# Patient Record
Sex: Female | Born: 1940
Health system: Southern US, Community
[De-identification: ages and names within clinical notes are randomized; demographics above are authoritative.]

## PROBLEM LIST (undated history)

## (undated) DIAGNOSIS — T7840XA Allergy, unspecified, initial encounter: Secondary | ICD-10-CM

## (undated) DIAGNOSIS — M722 Plantar fascial fibromatosis: Secondary | ICD-10-CM

## (undated) DIAGNOSIS — G709 Myoneural disorder, unspecified: Secondary | ICD-10-CM

## (undated) DIAGNOSIS — N393 Stress incontinence (female) (male): Secondary | ICD-10-CM

## (undated) DIAGNOSIS — M1990 Unspecified osteoarthritis, unspecified site: Secondary | ICD-10-CM

## (undated) DIAGNOSIS — I10 Essential (primary) hypertension: Secondary | ICD-10-CM

## (undated) DIAGNOSIS — G4730 Sleep apnea, unspecified: Secondary | ICD-10-CM

## (undated) DIAGNOSIS — E669 Obesity, unspecified: Secondary | ICD-10-CM

## (undated) DIAGNOSIS — K219 Gastro-esophageal reflux disease without esophagitis: Secondary | ICD-10-CM

## (undated) DIAGNOSIS — G2581 Restless legs syndrome: Secondary | ICD-10-CM

## (undated) DIAGNOSIS — R001 Bradycardia, unspecified: Secondary | ICD-10-CM

## (undated) DIAGNOSIS — I499 Cardiac arrhythmia, unspecified: Secondary | ICD-10-CM

## (undated) DIAGNOSIS — Z87440 Personal history of urinary (tract) infections: Secondary | ICD-10-CM

## (undated) DIAGNOSIS — I4891 Unspecified atrial fibrillation: Secondary | ICD-10-CM

## (undated) DIAGNOSIS — I5189 Other ill-defined heart diseases: Secondary | ICD-10-CM

## (undated) DIAGNOSIS — C801 Malignant (primary) neoplasm, unspecified: Secondary | ICD-10-CM

## (undated) DIAGNOSIS — H269 Unspecified cataract: Secondary | ICD-10-CM

## (undated) DIAGNOSIS — E785 Hyperlipidemia, unspecified: Secondary | ICD-10-CM

## (undated) DIAGNOSIS — K635 Polyp of colon: Secondary | ICD-10-CM

## (undated) DIAGNOSIS — I340 Nonrheumatic mitral (valve) insufficiency: Secondary | ICD-10-CM

## (undated) DIAGNOSIS — F419 Anxiety disorder, unspecified: Secondary | ICD-10-CM

## (undated) DIAGNOSIS — J45909 Unspecified asthma, uncomplicated: Secondary | ICD-10-CM

## (undated) HISTORY — DX: Unspecified cataract: H26.9

## (undated) HISTORY — DX: Polyp of colon: K63.5

## (undated) HISTORY — DX: Nonrheumatic mitral (valve) insufficiency: I34.0

## (undated) HISTORY — PX: APPENDECTOMY: SHX54

## (undated) HISTORY — PX: VARICOSE VEIN SURGERY: SHX832

## (undated) HISTORY — PX: BLADDER SUSPENSION: SHX72

## (undated) HISTORY — DX: Essential (primary) hypertension: I10

## (undated) HISTORY — DX: Sleep apnea, unspecified: G47.30

## (undated) HISTORY — DX: Plantar fascial fibromatosis: M72.2

## (undated) HISTORY — DX: Hyperlipidemia, unspecified: E78.5

## (undated) HISTORY — PX: TONSILLECTOMY AND ADENOIDECTOMY: SUR1326

## (undated) HISTORY — DX: Restless legs syndrome: G25.81

## (undated) HISTORY — DX: Bradycardia, unspecified: R00.1

## (undated) HISTORY — DX: Other ill-defined heart diseases: I51.89

## (undated) HISTORY — DX: Obesity, unspecified: E66.9

## (undated) HISTORY — DX: Unspecified atrial fibrillation: I48.91

## (undated) HISTORY — PX: COLONOSCOPY W/ POLYPECTOMY: SHX1380

## (undated) HISTORY — DX: Allergy, unspecified, initial encounter: T78.40XA

## (undated) HISTORY — DX: Gastro-esophageal reflux disease without esophagitis: K21.9

## (undated) HISTORY — PX: OTHER SURGICAL HISTORY: SHX169

---

## 1990-12-01 HISTORY — PX: ABDOMINAL HYSTERECTOMY: SHX81

## 2000-09-10 ENCOUNTER — Encounter: Payer: Self-pay | Admitting: Vascular Surgery

## 2000-09-14 ENCOUNTER — Observation Stay (HOSPITAL_COMMUNITY): Admission: RE | Admit: 2000-09-14 | Discharge: 2000-09-15 | Payer: Self-pay | Admitting: Vascular Surgery

## 2000-09-14 ENCOUNTER — Encounter (INDEPENDENT_AMBULATORY_CARE_PROVIDER_SITE_OTHER): Payer: Self-pay

## 2001-02-01 ENCOUNTER — Other Ambulatory Visit: Admission: RE | Admit: 2001-02-01 | Discharge: 2001-02-01 | Payer: Self-pay | Admitting: Obstetrics and Gynecology

## 2001-11-08 ENCOUNTER — Encounter: Payer: Self-pay | Admitting: Urology

## 2001-11-10 ENCOUNTER — Ambulatory Visit (HOSPITAL_COMMUNITY): Admission: RE | Admit: 2001-11-10 | Discharge: 2001-11-10 | Payer: Self-pay | Admitting: Urology

## 2003-03-27 ENCOUNTER — Other Ambulatory Visit: Admission: RE | Admit: 2003-03-27 | Discharge: 2003-03-27 | Payer: Self-pay | Admitting: Obstetrics and Gynecology

## 2004-01-24 ENCOUNTER — Ambulatory Visit (HOSPITAL_BASED_OUTPATIENT_CLINIC_OR_DEPARTMENT_OTHER): Admission: RE | Admit: 2004-01-24 | Discharge: 2004-01-24 | Payer: Self-pay | Admitting: Family Medicine

## 2004-07-30 ENCOUNTER — Encounter: Admission: RE | Admit: 2004-07-30 | Discharge: 2004-07-30 | Payer: Self-pay | Admitting: Family Medicine

## 2005-02-17 ENCOUNTER — Other Ambulatory Visit: Admission: RE | Admit: 2005-02-17 | Discharge: 2005-02-17 | Payer: Self-pay | Admitting: Family Medicine

## 2005-08-11 ENCOUNTER — Encounter: Admission: RE | Admit: 2005-08-11 | Discharge: 2005-08-11 | Payer: Self-pay | Admitting: Family Medicine

## 2006-01-26 ENCOUNTER — Ambulatory Visit: Payer: Self-pay | Admitting: Gastroenterology

## 2006-02-05 ENCOUNTER — Ambulatory Visit: Payer: Self-pay | Admitting: Gastroenterology

## 2006-09-01 ENCOUNTER — Encounter: Admission: RE | Admit: 2006-09-01 | Discharge: 2006-09-01 | Payer: Self-pay | Admitting: Family Medicine

## 2007-09-03 ENCOUNTER — Encounter: Admission: RE | Admit: 2007-09-03 | Discharge: 2007-09-03 | Payer: Self-pay | Admitting: Family Medicine

## 2008-09-12 ENCOUNTER — Encounter: Admission: RE | Admit: 2008-09-12 | Discharge: 2008-09-12 | Payer: Self-pay | Admitting: Family Medicine

## 2008-09-21 ENCOUNTER — Encounter: Admission: RE | Admit: 2008-09-21 | Discharge: 2008-09-21 | Payer: Self-pay | Admitting: Family Medicine

## 2009-09-28 ENCOUNTER — Encounter: Admission: RE | Admit: 2009-09-28 | Discharge: 2009-09-28 | Payer: Self-pay | Admitting: Family Medicine

## 2010-09-30 ENCOUNTER — Encounter: Admission: RE | Admit: 2010-09-30 | Discharge: 2010-09-30 | Payer: Self-pay | Admitting: Family Medicine

## 2011-04-16 ENCOUNTER — Other Ambulatory Visit: Payer: Self-pay | Admitting: Internal Medicine

## 2011-04-18 NOTE — Op Note (Signed)
[facility]  Patient:    Julie Gill, Julie Gill [facility]                     MRN: [id number] Proc. Date: 09/14/00 Attending:  Quita Skye. Hart Rochester, M.D.                           Operative Report  PREOPERATIVE DIAGNOSIS:  Severe varicose veins of the right saphenous system.  POSTOPERATIVE DIAGNOSIS:  Severe varicose veins of the right saphenous system.  OPERATIONS: 1. Ligation and stripping of greater saphenous vein right leg. 2. Excision of multiple varicosities right leg.  SURGEON:  Quita Skye. Hart Rochester, M.D.  FIRST ASSISTANT:  Sherrie George, P.A.-C.  ANESTHESIA:  General endotracheal.  DESCRIPTION OF PROCEDURE:  The patient was taken to the operating room and placed in a supine position, at which time satisfactory general endotracheal anesthesia was administered.  The right leg was prepped with Betadine scrubbing solution and draped in a routine sterile manner.  The patients varicosities had been marked with the patient in the upright position prior to induction of anesthesia.  These involved the greater saphenous vein in the anterior and posterior calf extending up to the distal thigh.  A short incision was made anterior to the medial malleolus and the greater saphenous vein dissected free, being careful to avoid injury to the nerve.  The vein was ligated distally and opened, and the intraluminal stripper passed proximally and was palpated at the saphenofemoral junction where an oblique incision was made.  Saphenous vein was ligated at the saphenofemoral junction and transected and the small stripper head secured with a 2-0 silk tie.  Multiple transverse stab wound type incisions were made with a 15 blade over the marked varicosities in the calf and distal thigh, and these varicosities were removed by excision and avulsion techniques.  One incision was made in the posterior calf and a few in the pretibial region.  When this was completed, the leg was wrapped with  an ACE and the patient placed in the Trendelenburg position.  The saphenous vein stripped from proximal to distal.  Adequate compression applied.  Following this, the incisions were all closed in a subcuticular fashion with 3-0 Vicryl and Steri-Strips.  Sterile dressing was applied, and the patient was taken to the recovery room in satisfactory condition. DD:  09/14/00 TD:  09/14/00 Job: [id number]

## 2011-04-18 NOTE — Op Note (Signed)
[facility]  Patient:    MERINDA, VICTORINO [facility] Visit Number: [id number] MRN: [id number]          Service Type: DSU Location: DAY Attending Physician:  Katherine Roan Dictated by:   Rozanna Boer., M.D. Proc. Date: 11/10/01 Admit Date:  11/10/2001                             Operative Report  PREOPERATIVE DIAGNOSIS:  Stress urinary incontinence.  POSTOPERATIVE DIAGNOSIS:  Stress urinary incontinence.  OPERATION:  SPARC urethral sling.  ANESTHESIA:  General.  SURGEON:  Rozanna Boer., M.D.  BRIEF HISTORY:  This 70 year old white female has had worsening stress incontinence and enters now for urethral sling procedure.  She has been using for the last couple of months six pads a day for her incontinence.  In the last year, it has been 3-4 pads per day.  Anticholinergics have had little benefit.  She has a rather markedly positive stress test and enters now understanding the risks including but not limited to retention, inability to void, bleeding, pain, and infection.  DESCRIPTION OF PROCEDURE:  The patient was placed on the operating room table in the dorsal lithotomy position after satisfactory induction of general endotracheal anesthesia, was prepped and draped with Betadine in the usual sterile fashion.  She was given IV antibiotics.  The urethra was catheterized with a 16 Foley.  She was quite incontinent.  She was coughing during induction.  The bladder was drained and then over the mid urethral area, about 8 cc of a mixture of half-strength saline and 0.25% Marcaine was injected over the mid urethra and then laterally a short distance.  A midline incision was made over the mid urethra and with blunt tip scissors, a small pocket was developed on either side of the urethra.  This was so that I could feel the needle coming through the endopelvic fascia when it was passed.  There was some but minimal bleeding with  this maneuver.  Then attention was turned anteriorly where two small stab incisions were made 1 fingerbreadth off the midline on either side just 1 fingerbreadth above the symphysis pubis.  A spinal needle with 20 cc of a mixture of 0.25% Marcaine and saline mix was injected on either side to hydrodissect the bladder off the symphysis pubis because she had had previous hysterectomy.  Then the [facility] needles were then passed, hugging the symphysis pubis on either side as they popped into the space on either side of the urethra that had been previously created below. The catheter was then removed, and a cystoscope with a 70 degree lens was then inserted and the bladder filled.  The bladder was not punctured, but I could see the indentation of the needles as the bladder filled.  The scope was then removed, and the sling was then attached to either side of the [facility] needles and the midline marked with a hemostat as each side was pulled up into the suprapubic space.  With a clamp underneath the urethra to support it and to keep it from being too tight, I pulled the sheath off on either side, leaving the urethra supported, but the sling was fairly loosely placed underneath the urethra.  I could get a hemostat easily.  I could move it up and down nearly 1 cm underneath the urethra, demonstrating it was still loose.  Suprapubic pressure, she still leaked.  The wound  was then irrigated with saline and then closed with 3-0 Vicryl interrupted sutures which effected good hemostasis. The ends of the sling were then cut below the skin line suprapubically and the suprapubic incisions closed with Benzoin and Steri-Strips.  Sterile dry dressings were applied.  The Foley catheter was then reinserted, and vaginal packing was placed to tamponade this for about an hour.  She was taken to the recovery room in good condition.  Sponge, needle, and instrument counts were correct.  She will be then taken to outpatient  after the Foley and packing removed to be sure she can void before she goes home as an outpatient later today. Dictated by:   Rozanna Boer., M.D. Attending Physician:  Katherine Roan DD:  11/10/01 TD:  11/10/01 Job: [id number]

## 2011-04-25 ENCOUNTER — Ambulatory Visit (AMBULATORY_SURGERY_CENTER): Payer: Medicare Other | Admitting: *Deleted

## 2011-04-25 ENCOUNTER — Encounter: Payer: Self-pay | Admitting: Internal Medicine

## 2011-04-25 VITALS — Ht 63.0 in | Wt 186.0 lb

## 2011-04-25 DIAGNOSIS — Z86010 Personal history of colonic polyps: Secondary | ICD-10-CM

## 2011-04-25 MED ORDER — PEG-KCL-NACL-NASULF-NA ASC-C 100 G PO SOLR: ORAL | Status: DC

## 2011-05-06 ENCOUNTER — Ambulatory Visit (AMBULATORY_SURGERY_CENTER): Payer: Medicare Other | Admitting: Internal Medicine

## 2011-05-06 ENCOUNTER — Encounter: Payer: Self-pay | Admitting: Internal Medicine

## 2011-05-06 VITALS — BP 165/67 | HR 64 | Temp 98.5°F | Resp 20 | Ht 63.0 in | Wt 184.0 lb

## 2011-05-06 DIAGNOSIS — D126 Benign neoplasm of colon, unspecified: Secondary | ICD-10-CM

## 2011-05-06 DIAGNOSIS — Z1211 Encounter for screening for malignant neoplasm of colon: Secondary | ICD-10-CM

## 2011-05-06 DIAGNOSIS — Z800 Family history of malignant neoplasm of digestive organs: Secondary | ICD-10-CM

## 2011-05-06 DIAGNOSIS — Z86010 Personal history of colonic polyps: Secondary | ICD-10-CM

## 2011-05-06 MED ORDER — SODIUM CHLORIDE 0.9 % IV SOLN: 500.0000 mL | INTRAVENOUS | Status: DC

## 2011-05-06 NOTE — Patient Instructions (Signed)
Please review discharge instructions Please review information on diverticulosis and polyps

## 2011-05-07 ENCOUNTER — Telehealth: Payer: Self-pay

## 2011-05-07 NOTE — Telephone Encounter (Signed)

## 2011-05-13 NOTE — Progress Notes (Signed)
Quick Note:  Repeat colonoscopy 5 years Prior adenomas and a family hx colon cancer   ______

## 2011-09-15 ENCOUNTER — Other Ambulatory Visit: Payer: Self-pay | Admitting: Family Medicine

## 2011-09-15 DIAGNOSIS — Z1231 Encounter for screening mammogram for malignant neoplasm of breast: Secondary | ICD-10-CM

## 2011-10-03 ENCOUNTER — Ambulatory Visit
Admission: RE | Admit: 2011-10-03 | Discharge: 2011-10-03 | Disposition: A | Discharge status: Home / Self Care | Payer: Medicare Other | Source: Ambulatory Visit | Attending: Family Medicine | Admitting: Family Medicine

## 2011-10-03 DIAGNOSIS — Z1231 Encounter for screening mammogram for malignant neoplasm of breast: Secondary | ICD-10-CM

## 2011-11-07 ENCOUNTER — Telehealth: Payer: Self-pay | Admitting: Internal Medicine

## 2011-11-07 NOTE — Telephone Encounter (Signed)
Selita called fro [facility] and i scheduled pt appt 12-13/mt

## 2011-11-07 NOTE — Telephone Encounter (Addendum)
New problem Pt's daughter wanted to get mother in to see Dr Graciela Husbands for 2nd opinion. She has been seeing Dr Jayme Cloud.

## 2011-11-12 ENCOUNTER — Encounter: Payer: Self-pay | Admitting: *Deleted

## 2011-11-13 ENCOUNTER — Ambulatory Visit (INDEPENDENT_AMBULATORY_CARE_PROVIDER_SITE_OTHER): Payer: Medicare Other | Admitting: Internal Medicine

## 2011-11-13 ENCOUNTER — Encounter: Payer: Self-pay | Admitting: Internal Medicine

## 2011-11-13 ENCOUNTER — Telehealth: Payer: Self-pay | Admitting: Internal Medicine

## 2011-11-13 ENCOUNTER — Encounter: Payer: Self-pay | Admitting: *Deleted

## 2011-11-13 DIAGNOSIS — I480 Paroxysmal atrial fibrillation: Secondary | ICD-10-CM | POA: Insufficient documentation

## 2011-11-13 DIAGNOSIS — R0600 Dyspnea, unspecified: Secondary | ICD-10-CM

## 2011-11-13 DIAGNOSIS — I4891 Unspecified atrial fibrillation: Secondary | ICD-10-CM

## 2011-11-13 DIAGNOSIS — R001 Bradycardia, unspecified: Secondary | ICD-10-CM

## 2011-11-13 DIAGNOSIS — I498 Other specified cardiac arrhythmias: Secondary | ICD-10-CM

## 2011-11-13 MED ORDER — PINDOLOL 5 MG PO TABS: ORAL_TABLET | ORAL | Status: DC

## 2011-11-13 MED ORDER — WARFARIN SODIUM 5 MG PO TABS: 5.0000 mg | ORAL_TABLET | Freq: Every day | ORAL | Status: DC

## 2011-11-13 MED ORDER — RIVAROXABAN 20 MG PO TABS: 20.0000 mg | ORAL_TABLET | Freq: Every day | ORAL | Status: DC

## 2011-11-13 NOTE — Telephone Encounter (Signed)
New message:  Pt was seen today but states that she will not be able to afford the new medication and the card that was given to her to off set the cost/her insurance does not allow her to use.  Please call and let her know what to do.

## 2011-11-13 NOTE — Assessment & Plan Note (Signed)
The patient has paroxysmal atrial fibrillation with a rapid ventricular response and heart rates up to the 150-70 range. She also has sinus bradycardia. We have discussed the strategy of rate control versus rhythm control. Initially we will pursue the former and change her calcium blocker to an ISA beta blocker specifically, pindoolol 2.5 twice daily.  She is a CHADS VASC score of 3 and will discontinue her aspirin. We have discussed alternative oral anticoagulation and will begin her on Rivaroxaban.  Her echo demonstrated mild left atrial enlargement.

## 2011-11-13 NOTE — Assessment & Plan Note (Signed)
She has dyspnea on exertion. She is some degree of hypertensive heart disease and probably diastolic dysfunction. Will plan to undertake a stress Myoview scan  Has she has baseline ST segment changes.

## 2011-11-13 NOTE — Patient Instructions (Addendum)
Your physician recommends that you schedule a follow-up appointment in: PENDING TEST RESULTS Your physician has recommended you make the following change in your medication:  STOP ASPIRIN AND DILTIAZEM START XARELTO 20 MG  1 EVERY DAY  PINDOLOL 2.5 MG  1/2   TAB TWICE DAILY Your physician has requested that you have en exercise stress myoview. For further information please visit [email or web address] Please follow instruction sheet, as given. DX DYSPNEA

## 2011-11-13 NOTE — Progress Notes (Signed)
HPI  Julie Gill is a 70 y.o. female Is seen at her own request for second opinion related to arrhythmias. Her daughter works at [employer] u[employer] and I was recommended from there apparently.  She has a greater than 6 month history of recurrent palpitations. He did become increasingly frequent severe and of longer duration over the interval. They can last now minutes to hours. They frequently occur at night. She described some palpitations all day. However, she has a racing heart which occurs relatively less frequently. It is associated with some degree of shortness of breath but not great effort intolerance It is however very anxiety provoking. She also has palpitations that last all day long. He is concerned about being in the field her heart beat in her right ear which occurred both during the day as well as at night.  Thromboembolic risk factors are notable for age, gender, hypertension for a CHADS VASC score of 3.  She has developed dyspnea on exertion. Cold air is particularly problematic it is associated with chest discomfort. She is short of breath at less than 100 feet. She has extreme problems sleeping. She snores and has had neck episodes although she is not able to sleep well and of longer duration at night. She apparently had a negative sleep study years ago.  Echo cardiogram undertaken by Dr. Lucia Gaskins demonstrated normal left ventricular function with some degree of left ventricular hypertrophy left atrial enlargement.  She also was given a 30 day event recorder. She ate her for ablation on 2 days on one episode lasted about 25 minutes maximum heart rate was 178. She also had sinus bradycardia with rates in the 40s.  Past Medical History  Diagnosis Date  . GERD (gastroesophageal reflux disease)   . Hyperlipidemia   . Hypertension   . Atrial fibrillation     CHADS VASC score 3  . Sleep disturbance   . Sinus bradycardia     Past Surgical History  Procedure Date  . Abdominal  hysterectomy   . Tonsillectomy and adenoidectomy   . Bladder suspension   . Colonoscopy w/ polypectomy 1995, [id number], 2007, 05/06/2011    adenomas  . Appendectomy     Current Outpatient Prescriptions  Medication Sig Dispense Refill  . aspirin 325 MG tablet Take 325 mg by mouth daily.        . calcium carbonate (OS-CAL) 600 MG TABS Take 600 mg by mouth daily. Takes 1 and 1/2 tablet daily       . Cholecalciferol (VITAMIN D PO) Take 4,000 Units by mouth daily.        Marland Kitchen diltiazem (CARDIZEM CD) 240 MG 24 hr capsule Take 240 mg by mouth daily.        . fish oil-omega-3 fatty acids 1000 MG capsule Take 2 g by mouth 3 (three) times daily.        Marland Kitchen lisinopril (PRINIVIL,ZESTRIL) 20 MG tablet Take 20 mg by mouth daily.        Marland Kitchen lisinopril-hydrochlorothiazide (PRINZIDE,ZESTORETIC) 20-25 MG per tablet Take 1 tablet by mouth daily.        . Multiple Vitamins-Minerals (MULTIVITAMIN WITH MINERALS) tablet Take 1 tablet by mouth daily.        . pravastatin (PRAVACHOL) 40 MG tablet Take 80 mg by mouth daily.        . ranitidine (ZANTAC) 150 MG capsule Take 150 mg by mouth daily.        . vitamin E 800 UNIT capsule Take 800 Units by  mouth daily.         Current Facility-Administered Medications  Medication Dose Route Frequency Provider Last Rate Last Dose  . 0.9 %  sodium chloride infusion  500 mL Intravenous Continuous Iva Boop, MD      . 0.9 %  sodium chloride infusion  500 mL Intravenous Continuous Iva Boop, MD        Allergies  Allergen Reactions  . Codeine Other (See Comments)    Skin crawls  . Prednisone Other (See Comments)    tachycardia   Family History  Problem Relation Age of Onset  . Colon cancer Maternal Uncle   . Colon cancer Maternal Grandmother      Review of Systems negative except from HPI and PMH apart from Insomnia and peripheral neuropathy Physical Exam Well developed and well nourished Obese older Caucasian female appearing her stated age in no acute  distress HENT normal E scleral and icterus clear Neck Supple JVP flat; carotids brisk and full Clear to ausculation Regular rate and rhythm, no murmurs S4 present Soft with active bowel sounds No clubbing cyanosis none Edema Alert and oriented, grossly normal motor and sensory function Lymph nodes negative Trachea midline Affect engaging Skin Warm and Dry   ECG today demonstrated sinus rhythm at 54 Interval 0.15/0.09/0.46 Axis is 30 Nonspecific ST-T changes in the inferolateral leads  Event recorder demonstrated atrial fibrillation with rates up to 170, #2 PACs, #3 PVCs, #4 sinus bradycardia  Assessment and  Plan

## 2011-11-13 NOTE — Telephone Encounter (Signed)
SPOKE WITH PT CANNOT AFFORD XARELTO COSTS 257.05  A MONTH AT [facility]  IS ON FIXED INCOME AND THE REBATE CARD SHE IS DISQUALIFIED DUE TO HAVING AARP MEDICARE. PT AWARE WILL  DISCUSS WITH DR KLEIN/CY   PT MAY NOT GET  ASSISTANCE WITH MED DUE TO HAVING INS. PER  DR Graciela Husbands  MAY START WARFARIN 5 MG  AS DIRECTED PT AWARE  MED SENT TO PHARMACY VIA EPIC  AND SOMEONE WILL  CALL TOMORROW  TO SCHEDULE APPT  AS NEW PT WITH COUMADIN CLINIC

## 2011-11-13 NOTE — Assessment & Plan Note (Signed)
The patient's bradycardia has been noted at home. This may have some impact on her ability to use rate control without backup bradycardia pacing or potentially need for Antiarrhythmic therapy

## 2011-11-14 NOTE — Telephone Encounter (Signed)
Spoke with pt.  Made appt for Coumadin clinic on 12/19.

## 2011-11-19 ENCOUNTER — Ambulatory Visit (INDEPENDENT_AMBULATORY_CARE_PROVIDER_SITE_OTHER): Payer: Medicare Other | Admitting: *Deleted

## 2011-11-19 ENCOUNTER — Ambulatory Visit (HOSPITAL_COMMUNITY): Payer: Medicare Other | Attending: Internal Medicine | Admitting: Radiology

## 2011-11-19 ENCOUNTER — Encounter: Payer: Self-pay | Admitting: Internal Medicine

## 2011-11-19 ENCOUNTER — Telehealth: Payer: Self-pay | Admitting: *Deleted

## 2011-11-19 VITALS — BP 141/74 | Ht 63.0 in | Wt 186.0 lb

## 2011-11-19 DIAGNOSIS — R5381 Other malaise: Secondary | ICD-10-CM | POA: Insufficient documentation

## 2011-11-19 DIAGNOSIS — I10 Essential (primary) hypertension: Secondary | ICD-10-CM | POA: Insufficient documentation

## 2011-11-19 DIAGNOSIS — R002 Palpitations: Secondary | ICD-10-CM | POA: Insufficient documentation

## 2011-11-19 DIAGNOSIS — E785 Hyperlipidemia, unspecified: Secondary | ICD-10-CM | POA: Insufficient documentation

## 2011-11-19 DIAGNOSIS — R0989 Other specified symptoms and signs involving the circulatory and respiratory systems: Secondary | ICD-10-CM | POA: Insufficient documentation

## 2011-11-19 DIAGNOSIS — R079 Chest pain, unspecified: Secondary | ICD-10-CM

## 2011-11-19 DIAGNOSIS — M79661 Pain in right lower leg: Secondary | ICD-10-CM

## 2011-11-19 DIAGNOSIS — R000 Tachycardia, unspecified: Secondary | ICD-10-CM | POA: Insufficient documentation

## 2011-11-19 DIAGNOSIS — Z7901 Long term (current) use of anticoagulants: Secondary | ICD-10-CM | POA: Insufficient documentation

## 2011-11-19 DIAGNOSIS — R0602 Shortness of breath: Secondary | ICD-10-CM | POA: Insufficient documentation

## 2011-11-19 DIAGNOSIS — R110 Nausea: Secondary | ICD-10-CM | POA: Insufficient documentation

## 2011-11-19 DIAGNOSIS — R5383 Other fatigue: Secondary | ICD-10-CM | POA: Insufficient documentation

## 2011-11-19 DIAGNOSIS — R0789 Other chest pain: Secondary | ICD-10-CM | POA: Insufficient documentation

## 2011-11-19 DIAGNOSIS — M79662 Pain in left lower leg: Secondary | ICD-10-CM

## 2011-11-19 DIAGNOSIS — I4891 Unspecified atrial fibrillation: Secondary | ICD-10-CM

## 2011-11-19 DIAGNOSIS — R0600 Dyspnea, unspecified: Secondary | ICD-10-CM

## 2011-11-19 DIAGNOSIS — E7800 Pure hypercholesterolemia, unspecified: Secondary | ICD-10-CM

## 2011-11-19 DIAGNOSIS — R0609 Other forms of dyspnea: Secondary | ICD-10-CM | POA: Insufficient documentation

## 2011-11-19 LAB — POCT INR: INR: 1.1

## 2011-11-19 MED ORDER — TECHNETIUM TC 99M TETROFOSMIN IV KIT
10.7000 | PACK | Freq: Once | INTRAVENOUS | Status: AC | PRN
  Administered 2011-11-19: 11 via INTRAVENOUS

## 2011-11-19 MED ORDER — TECHNETIUM TC 99M TETROFOSMIN IV KIT
33.0000 | PACK | Freq: Once | INTRAVENOUS | Status: AC | PRN
  Administered 2011-11-19: 33 via INTRAVENOUS

## 2011-11-19 NOTE — Progress Notes (Unsigned)
   Patient ID: Julie Gill, female    DOB: 1941-02-08, 70 y.o.   MRN: [id number]  HPI    Review of Systems    Physical Exam

## 2011-11-19 NOTE — Progress Notes (Signed)
[facility] SITE 3 NUCLEAR MED [address] [place] [address] [phone]  Cardiology Nuclear Med Study  Julie Gill is a 70 y.o. female [id number] August 15, 1941   Nuclear Med Background Indication for Stress Test:  Evaluation for Ischemia and recent new PAF with RVR  History:  No previous documented CAD, Abnormal EKG, Asthma, Echo: NL EF, and 1980's per patient Myocardial Perfusion Study: NL per patient  Cardiac Risk Factors: Hypertension and Lipids  Symptoms:  Chest Pressure and Chest tightness with Exertion (last date of chest discomfort 2-3 days ago, 30 minute duration per patient), DOE, Fatigue, Fatigue with Exertion, Nausea, Palpitations, Rapid HR and SOB   Nuclear Pre-Procedure Caffeine/Decaff Intake:  None NPO After: 10:00pm   Lungs:  Clear IV 0.9% NS with Angio Cath:  22g  IV Site: R Forearm  IV Started by:  Stanton Kidney, EMT-P  Chest Size (in):  36 Cup Size: C  Height: 5\' 3"  (1.6 m)  Weight:  186 lb (84.369 kg)  BMI:  Body mass index is 32.95 kg/(m^2). Tech Comments:  Held Pindolol > 24 hours, per patient.    Nuclear Med Study 1 or 2 day study: 1 day  Stress Test Type:  Stress  Reading MD: Willa Rough, MD  Order Authorizing Provider:  Sherryl Manges, MD  Resting Radionuclide: Technetium 61m Tetrofosmin  Resting Radionuclide Dose: 10.7 mCi   Stress Radionuclide:  Technetium 22m Tetrofosmin  Stress Radionuclide Dose: 33.0 mCi           Stress Protocol Rest HR: 55 Stress HR: 129  Rest BP: 141/74 Stress BP: 214/62  Exercise Time (min): 6:15 METS: 7.3   Predicted Max HR: 152 bpm % Max HR: 84.87 bpm Rate Pressure Product: 21308   Dose of Adenosine (mg):  n/a Dose of Lexiscan: n/a mg  Dose of Atropine (mg): n/a Dose of Dobutamine: n/a mcg/kg/min (at max HR)  Stress Test Technologist: Irean Hong, RN  Nuclear Technologist:  Domenic Polite, CNMT     Rest Procedure:  Myocardial perfusion imaging was performed at rest 45 minutes following  the intravenous administration of Technetium 63m Tetrofosmin. Rest ECG: SB- NSR with sinus arrhythmia, PAC  Stress Procedure:  The patient exercised for 6 minutes and 15 seconds, RPE=15.  The patient stopped due to DOE, bilateral calf tightness 8/10, and complained of chest tightness 8/10 at peak exercise.  There were nonspecific ST-T wave changes.There was a hypertensive response to exercise. There were occasional PAC's.  Technetium 78m Tetrofosmin was injected at peak exercise and myocardial perfusion imaging was performed after a brief delay. Dr. Odessa Fleming notified of the patients chest and calf tightness with exercise, and the hypertensive response to exercise. Lower arterial  Scheduled for 12/12/11 per Dr. Viviann Spare Klein/Patsy Dominga [place] Stress ECG: No significant change from baseline ECG  QPS Raw Data Images:  Patient motion noted; appropriate software correction applied. Stress Images:  Normal homogeneous uptake in all areas of the myocardium. Rest Images:  Normal homogeneous uptake in all areas of the myocardium. Subtraction (SDS):  No evidence of ischemia. Transient Ischemic Dilatation (Normal <1.22):  1.04 Lung/Heart Ratio (Normal <0.45):  0.24  Quantitative Gated Spect Images QGS EDV:  95 ml QGS ESV:  29 ml QGS cine images:  Normal Wall Motion QGS EF: 70%  Impression Exercise Capacity:  Limitted. See below BP Response:  Hypertensive blood pressure response. Clinical Symptoms:  See below ECG Impression:  No significant ST segment change suggestive of ischemia. Comparison with Prior Nuclear Study:  No previous nuclear study performed  Overall Impression:  There was marked increase in systolic BP early in stress. The patient then had significant chest pain and calf tightness. There was no EKG change. The nuclear images are normal. There is no scar or ischemia.  Willa Rough, MD

## 2011-11-19 NOTE — Telephone Encounter (Signed)
The patient had a Rest/Stress Myoview today.  Dr. Odessa Fleming notified of the patients chest and calf tightness with exercise, and the hypertensive response to exercise. Dr Graciela Husbands reviewed the images and EKG's. Increase the pindolol to 5mg  twice a day per verbal orders by Dr. Sherryl Manges. Alera Quevedo,RN  Lower arterial scheduled on  12/12/11 per verbal orders by Dr. Viviann Spare Klein/Antwione Picotte,RN  Follow-up in one week with [name] (PCP) for BP and HR check.Anterrio Mccleery,RN.

## 2011-11-19 NOTE — Progress Notes (Unsigned)
The patient underwent Myoview scanning today. There were no perfusion defects. Electrocardiogram was nondiagnostic. It was noted however that her blood pressure was exceedingly high. Resting blood pressure was 141 stage I blood pressure was 206/69. We will have her follow up with her primary care physician for blood pressure management.  She is also noted to have calf tightness and so we'll undertake ABIs.

## 2011-11-19 NOTE — Telephone Encounter (Signed)
Per orders from Dr. Graciela Husbands, he was going to have the patient increase pindolol to 5mg  bid due to elevated BP during her myoview. The patient HR was only 55 bpm. Diltiazem was listed on her med list, orders were given by Dr. Graciela Husbands to cut diltiazem in 1/2 and continue with orders for the higher dose of pindolol. Irean Hong, RN for nuc med called the patient about this. She has advised me that the patient is not on diltiazem- this was stopped about a week ago. She is on zestoretic (lisinopril/hctz) 20/25 one tablet every morning & lisinopril 20mg  one tablet every evening. The patient had been off her pindolol for about 24 hours when she had her test. I will forward this to Dr. Graciela Husbands to see if he still wants to increase the pindolol to 5 mg twice daily.

## 2011-11-19 NOTE — Patient Instructions (Signed)

## 2011-11-19 NOTE — Telephone Encounter (Signed)
Forwarding to [facility] for documentation of MD orders.

## 2011-11-20 ENCOUNTER — Telehealth: Payer: Self-pay | Admitting: Internal Medicine

## 2011-11-20 NOTE — Telephone Encounter (Signed)
I called the patient this morning. She asked that I call her daughter to discuss everything with her because she is very anxious and nervous about everything. I have left a message for Tammy on her cell # at [phone] to call.

## 2011-11-20 NOTE — Telephone Encounter (Signed)
Fu call Pt's daughter wanted to talk to you about this

## 2011-11-20 NOTE — Telephone Encounter (Signed)
I spoke with the patient's daughter. We reviewed the patient's medications and Dr. Odessa Fleming recommendations for increasing her pindolol to 5 mg bid. We reviewed that the patient should follow up with her PCP to manage her BP. She has a follow up with them the first or second week of January. She will take and record her BP readings in the interim. The patient is not symptomatic with her HR's. I explained the reasoning for her lower arterial study as well. The patient will follow up with Dr. Graciela Husbands on 12/30/11.

## 2011-11-26 ENCOUNTER — Ambulatory Visit (INDEPENDENT_AMBULATORY_CARE_PROVIDER_SITE_OTHER): Payer: Medicare Other | Admitting: *Deleted

## 2011-11-26 DIAGNOSIS — Z7901 Long term (current) use of anticoagulants: Secondary | ICD-10-CM

## 2011-11-26 DIAGNOSIS — I4891 Unspecified atrial fibrillation: Secondary | ICD-10-CM

## 2011-11-26 LAB — POCT INR: INR: 1.9

## 2011-11-27 ENCOUNTER — Telehealth: Payer: Self-pay | Admitting: *Deleted

## 2011-11-27 ENCOUNTER — Encounter: Payer: Self-pay | Admitting: Cardiology

## 2011-11-27 NOTE — Telephone Encounter (Signed)
I spoke with the patient regarding her myoview results. She reported that she had an episode of a-fib on Saturday night that lasted about 3 hours. Her HR was 123 bpm, but when she converted, she was in the 70's. She is feeling ok. I explained I would forward to Dr. Graciela Husbands as an Lorain Childes, but she should continue to monitor her episodes and if she feels bad or notices she is not coming out of a-fib, she should call us back. She voices understanding.

## 2011-12-02 NOTE — Telephone Encounter (Signed)
With her sinus rate in the 70's lets try increasing the pindolol from 2.5>>5 bid and see if we can control the rapid afib thankssteve

## 2011-12-03 ENCOUNTER — Ambulatory Visit (INDEPENDENT_AMBULATORY_CARE_PROVIDER_SITE_OTHER): Payer: Medicare Other | Admitting: *Deleted

## 2011-12-03 DIAGNOSIS — Z7901 Long term (current) use of anticoagulants: Secondary | ICD-10-CM

## 2011-12-03 DIAGNOSIS — I4891 Unspecified atrial fibrillation: Secondary | ICD-10-CM

## 2011-12-03 LAB — POCT INR: INR: 2.2

## 2011-12-03 NOTE — Telephone Encounter (Signed)
I spoke with the patient regarding Dr. Odessa Fleming recommendations to increase pindolol to 5mg  BID. Per the patient, she was already doing this. In reviewing her chart, we did make this increase on 11/19/11 after her myoview. Irean Hong, RN had spoken with Dr. Graciela Husbands after the patient's myoview due to her elevated BP readings. The patient's HR was at 55 bpm at that time. Orders received from Dr. Graciela Husbands to go ahead and have the patient increase pindolol to 5mg  BID. This is documented in her chart. In speaking with the patient today, she did give me BP and HR readings on her current regimen of meds:   12/28- (am) 119/50 HR-48 (before meds)            (pm) 121/51 HR-43 1/1- (am) 122/47 HR- 62        (afternoon) 118/57 HR-50        (pm) 103/43 HR-47 (after meds)  I explained to the patient I would forward this to Dr. Graciela Husbands for him to review and call her back after he does. She is agreeable.

## 2011-12-04 NOTE — Telephone Encounter (Signed)
Spoke with patient not doing too badly  Will leave her on currrent regime until we see her in la[place] jan  The next step swould be AAD rx

## 2011-12-04 NOTE — Telephone Encounter (Signed)
Routing to Dr. Klein

## 2011-12-08 ENCOUNTER — Telehealth: Payer: Self-pay | Admitting: *Deleted

## 2011-12-08 ENCOUNTER — Telehealth: Payer: Self-pay | Admitting: Internal Medicine

## 2011-12-08 DIAGNOSIS — I4891 Unspecified atrial fibrillation: Secondary | ICD-10-CM

## 2011-12-08 MED ORDER — WARFARIN SODIUM 5 MG PO TABS: 5.0000 mg | ORAL_TABLET | ORAL | Status: DC

## 2011-12-08 MED ORDER — PINDOLOL 5 MG PO TABS: 5.0000 mg | ORAL_TABLET | Freq: Two times a day (BID) | ORAL | Status: DC

## 2011-12-08 NOTE — Telephone Encounter (Signed)
Pt had a a-fib episode 1:30am Sunday morning and it lasted an hour and Dr. Graciela Husbands wanted to call if anything happen and no other symptoms just fast HR

## 2011-12-08 NOTE — Telephone Encounter (Signed)
Message copied by Denario Bagot, Franchot Mimes on Mon Dec 08, 2011 11:04 AM ------      Message from: [facility], PATRICIA L      Created: Mon Dec 08, 2011  9:28 AM      Regarding: refill       Pt called with another question and also stated she needed refill of Coumadin called to [facility] [address]  This is a new pharmacy for her.      Thanks,      Dennie Bible

## 2011-12-08 NOTE — Telephone Encounter (Signed)
Spoke with pt. She reports she had afib from 1:30-2:30 Sunday morning. Blood pressure was 151/59 and heart rate 75 at that time.  No other complaints. Heart rate regular now.  I told pt she should continue meds as prescribed and that I would make Dr. Graciela Husbands aware of her call and that we would call her if he had any additional recommendations.  She has appt with Dr. Graciela Husbands on December 30, 2011 and is aware of this appt. Will send refill of pindolol to [facility] on [address] and [address] per her request.

## 2011-12-11 ENCOUNTER — Telehealth: Payer: Self-pay | Admitting: Internal Medicine

## 2011-12-11 NOTE — Telephone Encounter (Signed)
I spoke with the patient's daughter. She reports that the patient's episodes of a-fib are occuring more frequently. Sunday she had an episode that lasted about an hour. Last night she had an episode that lasted about 2& 1/2 hours. She reported to her daughter that her rates were up to 125 bpm. For the first time last night, she did feel like she would faint if she passed out. It sounds like she converted to normal rhythm, then felt pre-syncopal, but that is not completely clear to me. She does feel "washed out" for about 2-3 days after she has an a-fib episode. I explained to Julie Gill, that we may need to look at putting her on different therapies for her a-fib. Her does of pindolol is already at 5 mg twice daily. Her rate in sinus rhythm tends to run between 40-60 bpm. I advised I would speak with Dr. Graciela Husbands and touch base with them tomorrow. Julie Gill is agreeable. The patient already has an appointment with Dr. Graciela Husbands on 1/29.

## 2011-12-11 NOTE — Telephone Encounter (Signed)
Follow-up:    Patient's daughter called in because her mother is still having problems quite a bit of problems with her afib. She had an episode, whit lasted two and a half hours, in which she experienced light-headedness.  Would like to know preventative measures she could take when she feels an episode coming.  Please call back.

## 2011-12-12 ENCOUNTER — Encounter (INDEPENDENT_AMBULATORY_CARE_PROVIDER_SITE_OTHER): Payer: Medicare Other | Admitting: *Deleted

## 2011-12-12 ENCOUNTER — Ambulatory Visit (INDEPENDENT_AMBULATORY_CARE_PROVIDER_SITE_OTHER): Payer: Medicare Other | Admitting: Internal Medicine

## 2011-12-12 DIAGNOSIS — M79661 Pain in right lower leg: Secondary | ICD-10-CM

## 2011-12-12 DIAGNOSIS — I739 Peripheral vascular disease, unspecified: Secondary | ICD-10-CM

## 2011-12-12 DIAGNOSIS — I4891 Unspecified atrial fibrillation: Secondary | ICD-10-CM

## 2011-12-12 MED ORDER — FLECAINIDE ACETATE 100 MG PO TABS: 100.0000 mg | ORAL_TABLET | Freq: Two times a day (BID) | ORAL | Status: DC

## 2011-12-12 NOTE — Assessment & Plan Note (Signed)
With clinically symptomatic atrial fibrillation normal left ventricular function and a nonischemic Myoview with discussed the role of antiarrhythmic drug therapy and has settled on the use of flecainide as our first choice. We did discuss progress and potential including the risk of death. She understands these risks. She is willing to proceed.  We'll begin her on flecainide 100 mg twice daily in addition to her pindolol. She was admitted for treadmill testing to assess her progress at risk at higher heart rates.

## 2011-12-12 NOTE — Telephone Encounter (Signed)
The patient was here today for her ABI's. Dr. Graciela Husbands saw the patient and started her on flecainide.

## 2011-12-12 NOTE — Progress Notes (Signed)
  HPI  Julie Gill is a 71 y.o. female Is seen in followup for atrial fibrillation she continues to have recurrent palpitations with rvr.  With her b radycardia  unfortureely limits rate control with strategies.  Past Medical History  Diagnosis Date  . GERD (gastroesophageal reflux disease)   . Hyperlipidemia   . Hypertension   . Atrial fibrillation     CHADS VASC score 3  . Sleep disturbance   . Sinus bradycardia     Past Surgical History  Procedure Date  . Abdominal hysterectomy   . Tonsillectomy and adenoidectomy   . Bladder suspension   . Colonoscopy w/ polypectomy 1995, [id number], 2007, 05/06/2011    adenomas  . Appendectomy   . Varicose vein surgery     Current Outpatient Prescriptions  Medication Sig Dispense Refill  . calcium carbonate (OS-CAL) 600 MG TABS Take 600 mg by mouth daily. Takes 1 and 1/2 tablet daily       . Cholecalciferol (VITAMIN D PO) Take 4,000 Units by mouth daily.        . fish oil-omega-3 fatty acids 1000 MG capsule Take two capsules by mouth twice daily      . flecainide (TAMBOCOR) 100 MG tablet Take 1 tablet (100 mg total) by mouth 2 (two) times daily.  60 tablet  6  . lisinopril (PRINIVIL,ZESTRIL) 20 MG tablet Take 20 mg by mouth daily.        Marland Kitchen lisinopril-hydrochlorothiazide (PRINZIDE,ZESTORETIC) 20-25 MG per tablet Take 1 tablet by mouth daily.        . Multiple Vitamins-Minerals (MULTIVITAMIN WITH MINERALS) tablet Take 1 tablet by mouth daily.        . pindolol (VISKEN) 5 MG tablet Take 1 tablet (5 mg total) by mouth 2 (two) times daily.  60 tablet  11  . pravastatin (PRAVACHOL) 40 MG tablet Take 80 mg by mouth daily.        . ranitidine (ZANTAC) 150 MG capsule Take 150 mg by mouth daily.        . vitamin E 800 UNIT capsule Take 800 Units by mouth daily.        Marland Kitchen warfarin (COUMADIN) 5 MG tablet Take 1 tablet (5 mg total) by mouth as directed.  120 tablet  0   Current Facility-Administered Medications  Medication Dose Route Frequency  Provider Last Rate Last Dose  . 0.9 %  sodium chloride infusion  500 mL Intravenous Continuous Iva Boop, MD      . 0.9 %  sodium chloride infusion  500 mL Intravenous Continuous Iva Boop, MD        Allergies  Allergen Reactions  . Codeine Other (See Comments)    Skin crawls  . Prednisone Other (See Comments)    tachycardia    Review of Systems negative except from HPI and PMH  Physical Exam There were no vitals taken for this visit. Well developed and well nourished in no acute distress HENT normal E scleral and icterus clear Neck Supple JVP flat; carotids brisk and full Clear to ausculation Regular rate and rhythm, no murmurs gallops or rub Soft with active bowel sounds No clubbing cyanosis none Edema Alert and oriented, grossly normal motor and sensory function Skin Warm and Dry   Assessment and  Plan

## 2011-12-12 NOTE — Patient Instructions (Signed)
Your physician has recommended you make the following change in your medication:  1) Start Flecainide 100 mg one tablet by mouth twice daily.  Your physician has requested that you have an exercise tolerance test in 7-10 days with Dr. Gay Filler, PA. For further information please visit [email or web address] Please also follow instruction sheet, as given.

## 2011-12-12 NOTE — Telephone Encounter (Signed)
It sound lijke we willneed a strategy change  To consider rhtyhm control or pacer support for bradycardia Lets have her come in to discuss options

## 2011-12-14 ENCOUNTER — Telehealth: Payer: Self-pay | Admitting: Adult Health

## 2011-12-14 NOTE — Telephone Encounter (Signed)
Patient called stating that since she started taking Flecinide, prescribed by Dr. Graciela Husbands on 12/12/2011 she has been having chest pressure and has not begun to have back pain as well. This was not occuring before she took the flecinide. She is concerned that the medication is causing this. She states that she is lightheaded as well.  I have advised her not to take the Flecinide dose tonight. I will have Dr. Odessa Fleming nurse call her in am for follow-up.

## 2011-12-15 ENCOUNTER — Ambulatory Visit (INDEPENDENT_AMBULATORY_CARE_PROVIDER_SITE_OTHER): Payer: Medicare Other

## 2011-12-15 ENCOUNTER — Telehealth: Payer: Self-pay | Admitting: Internal Medicine

## 2011-12-15 VITALS — BP 152/80 | HR 70

## 2011-12-15 DIAGNOSIS — I4891 Unspecified atrial fibrillation: Secondary | ICD-10-CM

## 2011-12-15 NOTE — Telephone Encounter (Signed)
Pt calling re BP 134/52 pulse 39 this am @ 930am and was to be called back re change in meds from this weekend, pls advise

## 2011-12-15 NOTE — Progress Notes (Signed)
The pt came into the office for an EKG to follow-up on the symptoms she is having since taking Flecainide.  The pt's last dose of this medication was Sunday morning.  The pt did speak with Joni Reining NP on Sunday about chest heaviness and feeling lightheaded.  The pt had EKG which showed NSR pulse 70.  The pt said that her husband dropped her off at the front door and she had to sit down in the lobby to catch her breath.  When I brought the pt back from the lobby she was short winded and I could hear her wheezing. After the pt sat for a few minutes her breathing was normal.  I listened to her lungs and they were clear.  The pt also complains of her head feeling like it is going to explode when she bends over.  The pt has taken 2 doses of Claritin for her head (no decongestant).  I asked the pt if she has a history of asthma.  The pt does not have a diagnosis of asthma but this has been mentioned to her in the past by PCP.  I spoke with Dr Graciela Husbands and made him aware of symptoms and EKG.  He recommends that the pt remain off of Flecainide at this time and contact the PCP about head pressure and wheezing.  The pt is scheduled to see her PCP on Wednesday. I instructed the pt to call the PCP or our office if she developed further problems prior to appointment.  Pt agreed with plan. Per Dr Graciela Husbands the pt can retry Flecainide in the future if her symptoms are related to respiratory infection.

## 2011-12-15 NOTE — Telephone Encounter (Signed)
I spoke with the pt and she complains of heaviness in chest, legs and arms feel weak, heaviness in head and her whole body does not feel right since starting Flecainide.  The pt's last dose was Sunday morning.  The pt states she has always had some chest tightness but this is worse since starting flecainide. I spoke with Dr Graciela Husbands and he would like the pt to come into the office for an EKG. The pt will come into the office after her appointment with nutritionist.

## 2011-12-16 NOTE — Telephone Encounter (Signed)
I was not in the office on Monday, but the patient did walk in for a nurse visit and this was addressed by Dr. Graciela Husbands.

## 2011-12-17 ENCOUNTER — Ambulatory Visit (INDEPENDENT_AMBULATORY_CARE_PROVIDER_SITE_OTHER): Payer: Medicare Other | Admitting: *Deleted

## 2011-12-17 DIAGNOSIS — Z7901 Long term (current) use of anticoagulants: Secondary | ICD-10-CM

## 2011-12-17 DIAGNOSIS — I4891 Unspecified atrial fibrillation: Secondary | ICD-10-CM

## 2011-12-17 LAB — POCT INR: INR: 2

## 2011-12-18 ENCOUNTER — Ambulatory Visit: Payer: Self-pay | Admitting: Cardiology

## 2011-12-18 ENCOUNTER — Other Ambulatory Visit: Payer: Self-pay | Admitting: Family Medicine

## 2011-12-18 DIAGNOSIS — I4891 Unspecified atrial fibrillation: Secondary | ICD-10-CM

## 2011-12-18 DIAGNOSIS — Z7901 Long term (current) use of anticoagulants: Secondary | ICD-10-CM

## 2011-12-18 DIAGNOSIS — R51 Headache: Secondary | ICD-10-CM

## 2011-12-19 ENCOUNTER — Ambulatory Visit
Admission: RE | Admit: 2011-12-19 | Discharge: 2011-12-19 | Disposition: A | Discharge status: Home / Self Care | Payer: Medicare Other | Source: Ambulatory Visit | Attending: Family Medicine | Admitting: Family Medicine

## 2011-12-19 ENCOUNTER — Encounter: Payer: Medicare Other | Admitting: Physician Assistant

## 2011-12-19 DIAGNOSIS — R51 Headache: Secondary | ICD-10-CM

## 2011-12-22 ENCOUNTER — Telehealth: Payer: Self-pay | Admitting: Internal Medicine

## 2011-12-22 ENCOUNTER — Other Ambulatory Visit: Payer: Medicare Other

## 2011-12-22 NOTE — Telephone Encounter (Signed)
Pt states she was supposed to report any afib episodes to Dr Graciela Husbands.  She states she had one last night around 1:00am while laying down which lasted about an hour and a half.  She also wanted to let Dr Graciela Husbands know that her pcp, Dr Uvaldo Rising, ordered a ct scan which was normal so her pcp does not feel she had a sinus infection.  Dr Uvaldo Rising faxed this report on Friday.

## 2011-12-22 NOTE — Telephone Encounter (Signed)
New problem Pt said she had episode of afib last night. She wanted you to know please call her back

## 2011-12-25 NOTE — Telephone Encounter (Signed)
Not in MD box- routing

## 2011-12-26 ENCOUNTER — Other Ambulatory Visit: Payer: Self-pay | Admitting: Internal Medicine

## 2011-12-30 ENCOUNTER — Ambulatory Visit: Payer: Medicare Other | Admitting: Internal Medicine

## 2011-12-30 NOTE — Progress Notes (Signed)
Addended by: Judithe Modest D on: 12/30/2011 03:25 PM   Modules accepted: Orders

## 2011-12-31 ENCOUNTER — Encounter: Payer: Medicare Other | Admitting: *Deleted

## 2012-01-06 ENCOUNTER — Ambulatory Visit (INDEPENDENT_AMBULATORY_CARE_PROVIDER_SITE_OTHER): Payer: Medicare Other | Admitting: Internal Medicine

## 2012-01-06 ENCOUNTER — Encounter: Payer: Self-pay | Admitting: Internal Medicine

## 2012-01-06 VITALS — BP 134/77 | HR 49 | Ht 63.0 in | Wt 191.8 lb

## 2012-01-06 DIAGNOSIS — I4891 Unspecified atrial fibrillation: Secondary | ICD-10-CM

## 2012-01-06 DIAGNOSIS — R001 Bradycardia, unspecified: Secondary | ICD-10-CM

## 2012-01-06 DIAGNOSIS — I10 Essential (primary) hypertension: Secondary | ICD-10-CM | POA: Insufficient documentation

## 2012-01-06 DIAGNOSIS — I498 Other specified cardiac arrhythmias: Secondary | ICD-10-CM

## 2012-01-06 NOTE — Assessment & Plan Note (Signed)
With the addition of dofetilide we will need to discontinue her hydrochlorothiazide.

## 2012-01-06 NOTE — Assessment & Plan Note (Signed)
She continues to have atrial fibrillation episodes that are quite disrupting. Because of her bradycardia, antiarrhythmic drug choices are limited. We discussed the potential role of dofetilide as it does not have a significant heart rate slowing effect. Other drugs might well end up requiring pacemaker implantation. I would like to avoid iatrogenic bradycardia of this degree. Hence, we will pursue dofetilide first as well as in parallel a referral to Dr. Johney Frame for consideration of atrial fibrillation ablation

## 2012-01-06 NOTE — Patient Instructions (Signed)
Dr. Graciela Husbands has recommended that you start Tikosyn. This will require a 72 hour hospitalization for initiation.   You are being referred to Dr. Johney Frame for consideration of a- fib ablation.

## 2012-01-06 NOTE — Assessment & Plan Note (Signed)
As above.

## 2012-01-06 NOTE — Progress Notes (Signed)
  HPI  Julie Gill is a 71 y.o. female Is seen in followup for atrial fibrillation;She continues to have recurrent palpitations with rvr.  Bradycardia has also been an issue. Attempts to use flecainide were complicated by significant headache that resolved with its discontinuation.  She's had 3 episodes of atrial fibrillation the last week. These remain quite discombobulating, saying that she feels like she is going to die.  She is also concerned about her slow heart rate.  She continues to have a somewhat pleuritic chest pain over the last greater than 4 months. Past Medical History  Diagnosis Date  . GERD (gastroesophageal reflux disease)   . Hyperlipidemia   . Hypertension   . Atrial fibrillation     CHADS VASC score 3  . Sleep disturbance   . Sinus bradycardia     Past Surgical History  Procedure Date  . Abdominal hysterectomy   . Tonsillectomy and adenoidectomy   . Bladder suspension   . Colonoscopy w/ polypectomy 1995, [id number], 2007, 05/06/2011    adenomas  . Appendectomy   . Varicose vein surgery     Current Outpatient Prescriptions  Medication Sig Dispense Refill  . calcium carbonate (OS-CAL) 600 MG TABS Take 600 mg by mouth daily. Takes 1 and 1/2 tablet daily       . Cholecalciferol (VITAMIN D PO) Take 4,000 Units by mouth daily.        . fish oil-omega-3 fatty acids 1000 MG capsule Take two capsules by mouth twice daily      . lisinopril (PRINIVIL,ZESTRIL) 20 MG tablet Take 20 mg by mouth daily.        Marland Kitchen lisinopril-hydrochlorothiazide (PRINZIDE,ZESTORETIC) 20-25 MG per tablet Take 1 tablet by mouth daily.        . Multiple Vitamins-Minerals (MULTIVITAMIN WITH MINERALS) tablet Take 1 tablet by mouth daily.        . pindolol (VISKEN) 5 MG tablet Take 1 tablet (5 mg total) by mouth 2 (two) times daily.  60 tablet  11  . pravastatin (PRAVACHOL) 40 MG tablet Take 80 mg by mouth daily.        . vitamin E 800 UNIT capsule Take 800 Units by mouth daily.        Marland Kitchen  warfarin (COUMADIN) 5 MG tablet Take 1 tablet (5 mg total) by mouth as directed.  120 tablet  0    Allergies  Allergen Reactions  . Codeine Other (See Comments)    Skin crawls  . Prednisone Other (See Comments)    tachycardia    Review of Systems negative except from HPI and PMH  Physical Exam BP 134/77  Pulse 49  Ht 5\' 3"  (1.6 m)  Wt 191 lb 12.8 oz (87 kg)  BMI 33.98 kg/m2 Well developed and well nourished in no acute distress HENT normal E scleral and icterus clear Neck Supple JVP flat; carotids brisk and full Clear to ausculation Regular rate and rhythm, no murmurs gallops or rub Soft with active bowel sounds No clubbing cyanosis none Edema Alert and oriented, grossly normal motor and sensory function Skin Warm and Dry  Sinus rhythm at 49 Intervals 0.15/0.09/0.46 with a QTC of 0.42 axis is leftward at -18  Assessment and  Plan

## 2012-01-07 ENCOUNTER — Telehealth: Payer: Self-pay | Admitting: *Deleted

## 2012-01-07 ENCOUNTER — Other Ambulatory Visit (INDEPENDENT_AMBULATORY_CARE_PROVIDER_SITE_OTHER): Payer: Medicare Other | Admitting: *Deleted

## 2012-01-07 DIAGNOSIS — I4891 Unspecified atrial fibrillation: Secondary | ICD-10-CM

## 2012-01-07 DIAGNOSIS — I10 Essential (primary) hypertension: Secondary | ICD-10-CM

## 2012-01-07 LAB — BASIC METABOLIC PANEL
BUN: 18 mg/dL (ref 6–23)
CO2: 27 mEq/L (ref 19–32)
Calcium: 9.3 mg/dL (ref 8.4–10.5)
Chloride: 103 mEq/L (ref 96–112)
Creatinine, Ser: 0.7 mg/dL (ref 0.4–1.2)
GFR: 84.11 mL/min (ref >60.00)
Glucose, Bld: 112 mg/dL — ABNORMAL HIGH (ref 70–99)
Potassium: 3.9 mEq/L (ref 3.5–5.1)
Sodium: 141 mEq/L (ref 135–145)

## 2012-01-07 LAB — MAGNESIUM: Magnesium: 1.9 mg/dL (ref 1.5–2.5)

## 2012-01-07 MED ORDER — POTASSIUM CHLORIDE CRYS ER 20 MEQ PO TBCR: EXTENDED_RELEASE_TABLET | ORAL | Status: DC

## 2012-01-07 NOTE — Telephone Encounter (Signed)
Pre Tikosyn lab results reviewed with Dr. Graciela Husbands. Per Dr. Graciela Husbands- start magnesium OTC supplement and potassium  20 meq once daily. The patient is aware of Dr. Odessa Fleming recommendations. She states that she thinks she is already on a magnesium supplement every night. She will check this and if she is, she will increase to two tablets of magnesium daily. I will send in a RX for potassium to [facility] on [address]. She will come in Monday 2/11 for a STAT bmp/magnesium. She verbalizes understanding.

## 2012-01-07 NOTE — Telephone Encounter (Signed)
I spoke with the patient regarding Tikosyn initiation. I explained that after her visit last night, we realized that our pharmacist, Kennon Rounds, is out of the office the rest of this week. Dr. Graciela Husbands wants her to come today for a bmp/ Magnesium level. We will repeat this on Monday 2/11 prior to her going in for a Tikosyn load that day. Per Dr. Graciela Husbands, she will need to d/c lisinopril/hctz and increase plain lisinopril to 40 mg once daily. The patient verbalizes understanding of the above. She will come around 10:30 am for labs today.

## 2012-01-09 ENCOUNTER — Telehealth: Payer: Self-pay | Admitting: Internal Medicine

## 2012-01-09 NOTE — Telephone Encounter (Signed)
New Problem   Patient would like return call at hm# [phone] today concerning medication and upcoming procedure on Monday Morning.

## 2012-01-12 ENCOUNTER — Encounter: Payer: Self-pay | Admitting: Internal Medicine

## 2012-01-12 ENCOUNTER — Other Ambulatory Visit (INDEPENDENT_AMBULATORY_CARE_PROVIDER_SITE_OTHER): Payer: Medicare Other | Admitting: *Deleted

## 2012-01-12 ENCOUNTER — Encounter (HOSPITAL_COMMUNITY): Payer: Self-pay | Admitting: General Practice

## 2012-01-12 ENCOUNTER — Inpatient Hospital Stay (HOSPITAL_COMMUNITY)
Admission: AD | Admit: 2012-01-12 | Discharge: 2012-01-16 | DRG: 310 | Disposition: A | Discharge status: Home / Self Care | Payer: Medicare Other | Source: Ambulatory Visit | Attending: Internal Medicine | Admitting: Internal Medicine

## 2012-01-12 ENCOUNTER — Telehealth: Payer: Self-pay | Admitting: Internal Medicine

## 2012-01-12 DIAGNOSIS — Z7901 Long term (current) use of anticoagulants: Secondary | ICD-10-CM

## 2012-01-12 DIAGNOSIS — R519 Headache, unspecified: Secondary | ICD-10-CM

## 2012-01-12 DIAGNOSIS — E7800 Pure hypercholesterolemia, unspecified: Secondary | ICD-10-CM

## 2012-01-12 DIAGNOSIS — I4891 Unspecified atrial fibrillation: Principal | ICD-10-CM

## 2012-01-12 DIAGNOSIS — E785 Hyperlipidemia, unspecified: Secondary | ICD-10-CM | POA: Diagnosis present

## 2012-01-12 DIAGNOSIS — I4892 Unspecified atrial flutter: Secondary | ICD-10-CM | POA: Diagnosis present

## 2012-01-12 DIAGNOSIS — I10 Essential (primary) hypertension: Secondary | ICD-10-CM

## 2012-01-12 DIAGNOSIS — R51 Headache: Secondary | ICD-10-CM

## 2012-01-12 DIAGNOSIS — K219 Gastro-esophageal reflux disease without esophagitis: Secondary | ICD-10-CM | POA: Diagnosis present

## 2012-01-12 DIAGNOSIS — I480 Paroxysmal atrial fibrillation: Secondary | ICD-10-CM | POA: Insufficient documentation

## 2012-01-12 DIAGNOSIS — I498 Other specified cardiac arrhythmias: Secondary | ICD-10-CM | POA: Diagnosis present

## 2012-01-12 DIAGNOSIS — G479 Sleep disorder, unspecified: Secondary | ICD-10-CM | POA: Diagnosis present

## 2012-01-12 DIAGNOSIS — R001 Bradycardia, unspecified: Secondary | ICD-10-CM | POA: Insufficient documentation

## 2012-01-12 HISTORY — DX: Unspecified osteoarthritis, unspecified site: M19.90

## 2012-01-12 HISTORY — DX: Anxiety disorder, unspecified: F41.9

## 2012-01-12 LAB — MAGNESIUM: Magnesium: 1.9 mg/dL (ref 1.5–2.5)

## 2012-01-12 LAB — BASIC METABOLIC PANEL
BUN: 17 mg/dL (ref 6–23)
CO2: 28 mEq/L (ref 19–32)
Calcium: 8.9 mg/dL (ref 8.4–10.5)
Chloride: 105 mEq/L (ref 96–112)
Creatinine, Ser: 0.7 mg/dL (ref 0.4–1.2)
GFR: 85.45 mL/min (ref >60.00)
Glucose, Bld: 103 mg/dL — ABNORMAL HIGH (ref 70–99)
Potassium: 4 mEq/L (ref 3.5–5.1)
Sodium: 140 mEq/L (ref 135–145)

## 2012-01-12 LAB — PROTIME-INR
INR: 1.51 — ABNORMAL HIGH (ref 0.00–1.49)
Prothrombin Time: 18.5 seconds — ABNORMAL HIGH (ref 11.6–15.2)

## 2012-01-12 MED ORDER — SODIUM CHLORIDE 0.9 % IJ SOLN: 3.0000 mL | INTRAMUSCULAR | Status: DC | PRN

## 2012-01-12 MED ORDER — MAGNESIUM SULFATE 40 MG/ML IJ SOLN
2.0000 g | Freq: Once | INTRAMUSCULAR | Status: AC
  Administered 2012-01-12: 2 g via INTRAVENOUS
  Filled 2012-01-12 (×2): qty 50

## 2012-01-12 MED ORDER — OMEGA-3-ACID ETHYL ESTERS 1 G PO CAPS
1.0000 g | ORAL_CAPSULE | Freq: Every day | ORAL | Status: DC
  Administered 2012-01-13 – 2012-01-16 (×4): 1 g via ORAL
  Filled 2012-01-12 (×5): qty 1

## 2012-01-12 MED ORDER — CALCIUM CARBONATE 1250 (500 CA) MG PO TABS
1.0000 | ORAL_TABLET | Freq: Every day | ORAL | Status: DC
  Administered 2012-01-13 – 2012-01-16 (×4): 500 mg via ORAL
  Filled 2012-01-12 (×5): qty 1

## 2012-01-12 MED ORDER — DOFETILIDE 500 MCG PO CAPS
500.0000 ug | ORAL_CAPSULE | Freq: Two times a day (BID) | ORAL | Status: DC
  Administered 2012-01-12: 500 ug via ORAL
  Filled 2012-01-12 (×3): qty 1

## 2012-01-12 MED ORDER — CLONAZEPAM 0.5 MG PO TABS
0.2500 mg | ORAL_TABLET | Freq: Two times a day (BID) | ORAL | Status: DC | PRN
  Administered 2012-01-13 – 2012-01-16 (×3): 0.25 mg via ORAL
  Filled 2012-01-12 (×3): qty 1

## 2012-01-12 MED ORDER — SODIUM CHLORIDE 0.9 % IV SOLN: 250.0000 mL | INTRAVENOUS | Status: DC | PRN

## 2012-01-12 MED ORDER — WARFARIN SODIUM 10 MG PO TABS
10.0000 mg | ORAL_TABLET | Freq: Once | ORAL | Status: DC
Start: 2012-01-12 — End: 2012-01-13
  Filled 2012-01-12: qty 1

## 2012-01-12 MED ORDER — CALCIUM CARBONATE 600 MG PO TABS
600.0000 mg | ORAL_TABLET | Freq: Every day | ORAL | Status: DC
  Filled 2012-01-12: qty 1

## 2012-01-12 MED ORDER — CALCIUM CARBONATE 1250 (500 CA) MG PO TABS
0.5000 | ORAL_TABLET | Freq: Every day | ORAL | Status: DC
  Administered 2012-01-12 – 2012-01-15 (×4): 250 mg via ORAL
  Filled 2012-01-12 (×7): qty 0.5

## 2012-01-12 MED ORDER — SODIUM CHLORIDE 0.9 % IJ SOLN
3.0000 mL | Freq: Two times a day (BID) | INTRAMUSCULAR | Status: DC
  Administered 2012-01-12 – 2012-01-16 (×8): 3 mL via INTRAVENOUS

## 2012-01-12 MED ORDER — LISINOPRIL 20 MG PO TABS
20.0000 mg | ORAL_TABLET | Freq: Every day | ORAL | Status: DC
  Administered 2012-01-13 – 2012-01-16 (×4): 20 mg via ORAL
  Filled 2012-01-12 (×5): qty 1

## 2012-01-12 MED ORDER — PINDOLOL 5 MG PO TABS
2.5000 mg | ORAL_TABLET | Freq: Two times a day (BID) | ORAL | Status: DC
  Administered 2012-01-12: 2.5 mg via ORAL
  Filled 2012-01-12 (×4): qty 1

## 2012-01-12 MED ORDER — ONDANSETRON HCL 4 MG/2ML IJ SOLN
4.0000 mg | Freq: Three times a day (TID) | INTRAMUSCULAR | Status: DC | PRN
  Administered 2012-01-12: 4 mg via INTRAVENOUS
  Filled 2012-01-12: qty 2

## 2012-01-12 MED ORDER — VITAMIN D3 25 MCG (1000 UNIT) PO TABS
4000.0000 [IU] | ORAL_TABLET | Freq: Every day | ORAL | Status: DC
  Administered 2012-01-13 – 2012-01-16 (×4): 4000 [IU] via ORAL
  Filled 2012-01-12 (×5): qty 4

## 2012-01-12 MED ORDER — VITAMIN E 180 MG (400 UNIT) PO CAPS
800.0000 [IU] | ORAL_CAPSULE | Freq: Every day | ORAL | Status: DC
  Administered 2012-01-13 – 2012-01-16 (×3): 800 [IU] via ORAL
  Filled 2012-01-12 (×6): qty 2

## 2012-01-12 MED ORDER — POTASSIUM CHLORIDE CRYS ER 20 MEQ PO TBCR
20.0000 meq | EXTENDED_RELEASE_TABLET | Freq: Every day | ORAL | Status: DC
  Administered 2012-01-12 – 2012-01-16 (×5): 20 meq via ORAL
  Filled 2012-01-12 (×5): qty 1

## 2012-01-12 MED ORDER — ADULT MULTIVITAMIN W/MINERALS CH
1.0000 | ORAL_TABLET | Freq: Every day | ORAL | Status: DC
  Administered 2012-01-13 – 2012-01-16 (×4): 1 via ORAL
  Filled 2012-01-12 (×5): qty 1

## 2012-01-12 MED ORDER — SIMVASTATIN 40 MG PO TABS
40.0000 mg | ORAL_TABLET | Freq: Every day | ORAL | Status: DC
  Administered 2012-01-12 – 2012-01-15 (×4): 40 mg via ORAL
  Filled 2012-01-12 (×6): qty 1

## 2012-01-12 MED ORDER — POLYETHYLENE GLYCOL 3350 17 G PO PACK
17.0000 g | PACK | Freq: Every day | ORAL | Status: DC
  Administered 2012-01-13 – 2012-01-16 (×3): 17 g via ORAL
  Filled 2012-01-12 (×5): qty 1

## 2012-01-12 NOTE — Telephone Encounter (Signed)
I spoke with the patient today. Her labs are ok for tikosyn load. She will await a call from [facility] for a bed assignment.

## 2012-01-12 NOTE — H&P (Signed)
   History and Physical  Patient ID: Julie Gill MRN: [id number], SOB: 02-10-1941 71 y.o. Date of Encounter: 01/12/2012, 3:20 PM  Primary Physician: [name], MD, MD Primary Cardiologist:  Primary Electrophysiologist:  Graciela Husbands  Chief Complaint: afib  History of Present Illness: Julie Gill is a 71 y.o. female  With recurrent atrial fibrillation and flutter and bradycardia admitted for tikosyn.. She has failed flecanide and brady with pindolol and HR in 40s with fatigue  Echo Nl LV fn with LVH and LAE  CHADSVAS score 3  HTN, gender, age   Past Medical History  Diagnosis Date  . GERD (gastroesophageal reflux disease)   . Hyperlipidemia   . Hypertension   . Atrial fibrillation     CHADS VASC score 3  . Sleep disturbance   . Sinus bradycardia      Past Surgical History  Procedure Date  . Abdominal hysterectomy   . Tonsillectomy and adenoidectomy   . Bladder suspension   . Colonoscopy w/ polypectomy 1995, [id number], 2007, 05/06/2011    adenomas  . Appendectomy   . Varicose vein surgery       No current facility-administered medications for this encounter.     Allergies: Allergies  Allergen Reactions  . Codeine Other (See Comments)    Skin crawls  . Prednisone Other (See Comments)    tachycardia     History  Substance Use Topics  . Smoking status: Former Smoker    Quit date: 12/01/1984  . Smokeless tobacco: Never Used  . Alcohol Use: No      Family History  Problem Relation Age of Onset  . Colon cancer Maternal Uncle   . Colon cancer Maternal Grandmother   . Diabetes Brother   . Heart failure Mother   . Lung cancer Father       ROS:  Please see the history of present illness.    All other systems reviewed and negative.   Vital Signs: HR 135  And pBP 165/110   PHYSICAL EXAM: General:  Well nourished, well developed female in no acute distress HEENT: normal Lymph: no adenopathy Neck: no JVD Endocrine:  No thryomegaly Vascular: No  carotid bruits; FA pulses 2+ bilaterally without bruits Cardiac: rapid S1S2 with out murmur Back: without kyphosis/scoliosis, no CVA tenderness Lungs:  clear to auscultation bilaterally, no wheezing, rhonchi or rales Abd: soft, nontender, no hepatomegaly Ext: no edema Musculoskeletal:  No deformities, BUE and BLE strength normal and equal Skin: warm and dry Neuro:  CNs 2-12 intact, no focal abnormalities noted Psych:  Normal affect   EKG:  Pending  ECG on FRi QTC 420  Labs:   No results found for this basename: WBC, HGB, HCT, MCV, PLT    Lab 01/12/12 0847  NA 140  K 4.0  CL 105  CO2 28  BUN 17  CREATININE 0.7  CALCIUM 8.9  PROT --  BILITOT --  ALKPHOS --  ALT --  AST --  GLUCOSE 103*     ASSESSMENT AND PLAN:  AFib, flutter and bradycardia In setting of hypertension  Begin tikosyn,, and continue K/MG repletion May need more antihypertensive therapy following the discontinuation of HCTZ May need outpt sleep study   Duke Salvia, MD  01/12/2012, 3:20 PM

## 2012-01-12 NOTE — Telephone Encounter (Addendum)
error 

## 2012-01-12 NOTE — Telephone Encounter (Signed)
I found this message in my in-basket this morning. Call routed to me Friday when I was not here.

## 2012-01-12 NOTE — Progress Notes (Signed)
Orders written for Tikosyn initiation. Per Dr. Graciela Husbands, we will use QTC of 420 on 01/06/12's EKG and obtain EKG 2 hours after first dose. Replete Mg with 2gm IV mag sulfate and follow lytes. HCTZ was discontinued and should be stopped at discharge as well. No acute issues identified.  Dayna Dunn PA-C

## 2012-01-12 NOTE — Progress Notes (Signed)
ANTICOAGULATION CONSULT NOTE - Initial Consult  Pharmacy Consult for Warfarin Indication: atrial fibrillation  Allergies  Allergen Reactions  . Codeine Other (See Comments)    Skin crawls  . Prednisone Other (See Comments)    tachycardia    Patient Measurements: Height: 5\' 3"  (160 cm) Weight: 190 lb (86.183 kg) IBW/kg (Calculated) : 52.4  Heparin Dosing Weight: 86.2 kg  Vital Signs: Temp: 98.4 F (36.9 C) (02/11 1700) Temp src: Oral (02/11 1700) BP: 135/69 mmHg (02/11 1700) Pulse Rate: 64  (02/11 1700)  Labs:  Basename 01/12/12 1646 01/12/12 0847  HGB -- --  HCT -- --  PLT -- --  APTT -- --  LABPROT 18.5* --  INR 1.51* --  HEPARINUNFRC -- --  CREATININE -- 0.7  CKTOTAL -- --  CKMB -- --  TROPONINI -- --   Estimated Creatinine Clearance: 70 ml/min (by C-G formula based on Cr of 0.7).  Medical History: Past Medical History  Diagnosis Date  . GERD (gastroesophageal reflux disease)   . Hyperlipidemia   . Hypertension   . Atrial fibrillation     CHADS VASC score 3  . Sleep disturbance   . Sinus bradycardia   . Heart murmur   . Shortness of breath   . Anxiety   . Arthritis     Medications:  Prescriptions prior to admission  Medication Sig Dispense Refill  . calcium carbonate (OS-CAL) 600 MG TABS Take 300-600 mg by mouth See admin instructions. Takes 1 tablet in the morning and 1/2 tablet at night      . Cholecalciferol (VITAMIN D PO) Take 4,000 Units by mouth daily.        . clonazePAM (KLONOPIN) 0.5 MG tablet Take 0.25 mg by mouth 2 (two) times daily as needed. For stress      . fish oil-omega-3 fatty acids 1000 MG capsule Take two capsules by mouth twice daily      . lisinopril (PRINIVIL,ZESTRIL) 20 MG tablet Take two tablets by mouth once daily.      Marland Kitchen lisinopril-hydrochlorothiazide (PRINZIDE,ZESTORETIC) 20-25 MG per tablet Take 1 tablet by mouth daily. Held for upcoming procedure      . Magnesium 200 MG TABS Take 400 mg by mouth daily.       .  Multiple Vitamins-Minerals (MULTIVITAMIN WITH MINERALS) tablet Take 1 tablet by mouth daily.        . pindolol (VISKEN) 5 MG tablet Take 5 mg by mouth 2 (two) times daily.      . polyethylene glycol (MIRALAX / GLYCOLAX) packet Take 17 g by mouth daily.      . potassium chloride SA (K-DUR,KLOR-CON) 20 MEQ tablet Take 20 mEq by mouth daily. Take one tablet by mouth once daily      . pravastatin (PRAVACHOL) 40 MG tablet Take 80 mg by mouth daily.       . vitamin E (VITAMIN E) 400 UNIT capsule Take 800 Units by mouth daily.      Marland Kitchen warfarin (COUMADIN) 5 MG tablet Take 5-7.5 mg by mouth See admin instructions. Takes 1 tablet (5mg ) on Sunday, and 1.5 tablets (7.5mg ) Monday thru Saturday      . DISCONTD: pindolol (VISKEN) 5 MG tablet Take 1 tablet (5 mg total) by mouth 2 (two) times daily.  60 tablet  11  . DISCONTD: potassium chloride SA (K-DUR,KLOR-CON) 20 MEQ tablet Take one tablet by mouth once daily  30 tablet  6  . DISCONTD: warfarin (COUMADIN) 5 MG tablet Take 1 tablet (5  mg total) by mouth as directed.  120 tablet  0   Scheduled:     . calcium carbonate  0.5 tablet Oral QPC supper  . calcium carbonate  1 tablet Oral Q breakfast  . cholecalciferol  4,000 Units Oral Daily  . dofetilide  500 mcg Oral Q12H  . lisinopril  20 mg Oral Daily  . magnesium sulfate 1 - 4 g bolus IVPB  2 g Intravenous Once  . mulitivitamin with minerals  1 tablet Oral Daily  . omega-3 acid ethyl esters  1 g Oral Daily  . pindolol  2.5 mg Oral BID  . polyethylene glycol  17 g Oral Daily  . potassium chloride SA  20 mEq Oral Daily  . simvastatin  40 mg Oral q1800  . sodium chloride  3 mL Intravenous Q12H  . vitamin E  800 Units Oral Daily  . DISCONTD: calcium carbonate  600 mg Oral QPC breakfast    Assessment: 71 yo female on chronic Coumadin for afib with subtherapeutic INR today.  Pharmacy asked to manage Coumadin while in the hospital.  Spoke with patient, verified home dose of 7.5 mg daily except takes 5 mg on  Sundays, last dose yesterday.  Also asked to monitor for Tikosyn initation.  No major drug interactions identified at this time, but noticed lisinopril/HCTZ on home med list, currently on hold.  QTc 417 on 01/06/12 in MD office.  Mg, K, Scr within required limits.  Goal of Therapy:  INR 2-3   Plan:  1. Coumadin 10 mg x 1 tonight. 2. F/U daily PT/INR.  Gardner Candle 01/12/2012,6:27 PM

## 2012-01-12 NOTE — Telephone Encounter (Signed)
The patient is currently admitted for Tikosyn load.

## 2012-01-12 NOTE — Telephone Encounter (Signed)
New Problem   Patient would like to leave BP information for Heather, BP 160/110 and Pulse 72. Patient feels like she went into Afib after she talked with nurse earlier. She can be reached at hm#  [phone]

## 2012-01-13 ENCOUNTER — Other Ambulatory Visit: Payer: Self-pay

## 2012-01-13 DIAGNOSIS — R51 Headache: Secondary | ICD-10-CM

## 2012-01-13 DIAGNOSIS — I495 Sick sinus syndrome: Secondary | ICD-10-CM

## 2012-01-13 DIAGNOSIS — R519 Headache, unspecified: Secondary | ICD-10-CM

## 2012-01-13 LAB — BASIC METABOLIC PANEL
BUN: 19 mg/dL (ref 6–23)
CO2: 29 mEq/L (ref 19–32)
Calcium: 9.4 mg/dL (ref 8.4–10.5)
Chloride: 106 mEq/L (ref 96–112)
Creatinine, Ser: 0.76 mg/dL (ref 0.50–1.10)
GFR calc Af Amer: >90 mL/min (ref >90)
GFR calc non Af Amer: 85 mL/min — ABNORMAL LOW (ref >90)
Glucose, Bld: 87 mg/dL (ref 70–99)
Potassium: 4.7 mEq/L (ref 3.5–5.1)
Sodium: 143 mEq/L (ref 135–145)

## 2012-01-13 LAB — MAGNESIUM: Magnesium: 2.5 mg/dL (ref 1.5–2.5)

## 2012-01-13 LAB — PROTIME-INR
INR: 1.45 (ref 0.00–1.49)
Prothrombin Time: 17.9 seconds — ABNORMAL HIGH (ref 11.6–15.2)

## 2012-01-13 MED ORDER — ACETAMINOPHEN 325 MG PO TABS
650.0000 mg | ORAL_TABLET | ORAL | Status: DC | PRN
  Administered 2012-01-13 – 2012-01-16 (×4): 650 mg via ORAL
  Filled 2012-01-13 (×4): qty 2

## 2012-01-13 MED ORDER — DOFETILIDE 250 MCG PO CAPS
375.0000 ug | ORAL_CAPSULE | Freq: Two times a day (BID) | ORAL | Status: DC
  Administered 2012-01-13 (×2): 375 ug via ORAL
  Filled 2012-01-13 (×4): qty 1

## 2012-01-13 MED ORDER — WARFARIN SODIUM 10 MG PO TABS
10.0000 mg | ORAL_TABLET | Freq: Once | ORAL | Status: AC
  Administered 2012-01-13: 10 mg via ORAL
  Filled 2012-01-13: qty 1

## 2012-01-13 MED ORDER — PANTOPRAZOLE SODIUM 40 MG PO TBEC
40.0000 mg | DELAYED_RELEASE_TABLET | Freq: Every day | ORAL | Status: DC
  Administered 2012-01-13 – 2012-01-16 (×4): 40 mg via ORAL
  Filled 2012-01-13 (×4): qty 1

## 2012-01-13 NOTE — Progress Notes (Signed)
ANTICOAGULATION CONSULT NOTE - Initial Consult  Pharmacy Consult for Warfarin Indication: atrial fibrillation  Allergies  Allergen Reactions  . Codeine Other (See Comments)    Skin crawls  . Prednisone Other (See Comments)    tachycardia    Patient Measurements: Height: 5\' 3"  (160 cm) Weight: 190 lb (86.183 kg) IBW/kg (Calculated) : 52.4  Heparin Dosing Weight: 86.2 kg  Vital Signs: Temp: 98.1 F (36.7 C) (02/12 0500) Temp src: Oral (02/12 0500) BP: 119/50 mmHg (02/12 0937) Pulse Rate: 50  (02/12 0500)  Labs:  Julie Gill 01/13/12 0839 01/13/12 0515 01/12/12 1646 01/12/12 0847  HGB -- -- -- --  HCT -- -- -- --  PLT -- -- -- --  APTT -- -- -- --  LABPROT 17.9* -- 18.5* --  INR 1.45 -- 1.51* --  HEPARINUNFRC -- -- -- --  CREATININE -- 0.76 -- 0.7  CKTOTAL -- -- -- --  CKMB -- -- -- --  TROPONINI -- -- -- --   Estimated Creatinine Clearance: 70 ml/min (by C-G formula based on Cr of 0.76).  Medical History: Past Medical History  Diagnosis Date  . GERD (gastroesophageal reflux disease)   . Hyperlipidemia   . Hypertension   . Atrial fibrillation     CHADS VASC score 3  . Sleep disturbance   . Sinus bradycardia   . Heart murmur   . Shortness of breath   . Anxiety   . Arthritis     Medications:  Prescriptions prior to admission  Medication Sig Dispense Refill  . calcium carbonate (OS-CAL) 600 MG TABS Take 300-600 mg by mouth See admin instructions. Takes 1 tablet in the morning and 1/2 tablet at night      . Cholecalciferol (VITAMIN D PO) Take 4,000 Units by mouth daily.        . clonazePAM (KLONOPIN) 0.5 MG tablet Take 0.25 mg by mouth 2 (two) times daily as needed. For stress      . fish oil-omega-3 fatty acids 1000 MG capsule Take two capsules by mouth twice daily      . lisinopril (PRINIVIL,ZESTRIL) 20 MG tablet Take two tablets by mouth once daily.      Marland Kitchen lisinopril-hydrochlorothiazide (PRINZIDE,ZESTORETIC) 20-25 MG per tablet Take 1 tablet by mouth daily.  Held for upcoming procedure      . Magnesium 200 MG TABS Take 400 mg by mouth daily.       . Multiple Vitamins-Minerals (MULTIVITAMIN WITH MINERALS) tablet Take 1 tablet by mouth daily.        . pindolol (VISKEN) 5 MG tablet Take 5 mg by mouth 2 (two) times daily.      . polyethylene glycol (MIRALAX / GLYCOLAX) packet Take 17 g by mouth daily.      . potassium chloride SA (K-DUR,KLOR-CON) 20 MEQ tablet Take 20 mEq by mouth daily. Take one tablet by mouth once daily      . pravastatin (PRAVACHOL) 40 MG tablet Take 80 mg by mouth daily.       . vitamin E (VITAMIN E) 400 UNIT capsule Take 800 Units by mouth daily.      Marland Kitchen warfarin (COUMADIN) 5 MG tablet Take 5-7.5 mg by mouth See admin instructions. Takes 1 tablet (5mg ) on Sunday, and 1.5 tablets (7.5mg ) Monday thru Saturday      . DISCONTD: pindolol (VISKEN) 5 MG tablet Take 1 tablet (5 mg total) by mouth 2 (two) times daily.  60 tablet  11  . DISCONTD: potassium chloride SA (K-DUR,KLOR-CON) 20  MEQ tablet Take one tablet by mouth once daily  30 tablet  6  . DISCONTD: warfarin (COUMADIN) 5 MG tablet Take 1 tablet (5 mg total) by mouth as directed.  120 tablet  0   Scheduled:     . calcium carbonate  0.5 tablet Oral QPC supper  . calcium carbonate  1 tablet Oral Q breakfast  . cholecalciferol  4,000 Units Oral Daily  . dofetilide  375 mcg Oral Q12H  . lisinopril  20 mg Oral Daily  . magnesium sulfate 1 - 4 g bolus IVPB  2 g Intravenous Once  . mulitivitamin with minerals  1 tablet Oral Daily  . omega-3 acid ethyl esters  1 g Oral Daily  . polyethylene glycol  17 g Oral Daily  . potassium chloride SA  20 mEq Oral Daily  . simvastatin  40 mg Oral q1800  . sodium chloride  3 mL Intravenous Q12H  . vitamin E  800 Units Oral Daily  . warfarin  10 mg Oral Once  . DISCONTD: calcium carbonate  600 mg Oral QPC breakfast  . DISCONTD: dofetilide  500 mcg Oral Q12H  . DISCONTD: pindolol  2.5 mg Oral BID    Assessment: 71 yo female on chronic  Coumadin for afib with subtherapeutic INR today.  Pharmacy asked to manage Coumadin while in the hospital.  Spoke with patient, verified home dose of 7.5 mg daily except takes 5 mg on Sundays, last dose yesterday.  Also asked to monitor for Tikosyn initation.  No major drug interactions identified at this time.  QTc 500 this AM. Tikosyn dose has been reduced by MD.  Mg, K, Scr within required limits. Coumadin was likely not given last night as her INR is down this AM and dose was not charted.   Goal of Therapy:  INR 2-3   Plan:  1. Coumadin 10 mg x 1 tonight. 2. F/U daily PT/INR.  Julie Gill [place] 01/13/2012,10:02 AM

## 2012-01-13 NOTE — Progress Notes (Signed)
  Patient Name: Julie Gill      SUBJECTIVE:head adche and nausea last pm  Also some solw heart rates  Past Medical History  Diagnosis Date  . GERD (gastroesophageal reflux disease)   . Hyperlipidemia   . Hypertension   . Atrial fibrillation     CHADS VASC score 3  . Sleep disturbance   . Sinus bradycardia   . Heart murmur   . Shortness of breath   . Anxiety   . Arthritis     PHYSICAL EXAM Filed Vitals:   01/12/12 1500 01/12/12 1700 01/12/12 2100 01/13/12 0500  BP: 136/86 135/69 137/69 91/51  Pulse: 50 64 55 50  Temp: 98.1 F (36.7 C) 98.4 F (36.9 C) 98.2 F (36.8 C) 98.1 F (36.7 C)  TempSrc: Oral Oral Oral Oral  Resp: 16 17 18 18   Height: 5\' 3"  (1.6 m)     Weight: 190 lb (86.183 kg)     SpO2: 95% 98% 97% 96%    Well developed and nourished in no acute distress HENT normal Neck supple with JVP-flat Clear Regular rate and rhythm, no murmurs or gallops Abd-soft with active BS without hepatomegaly No Clubbing cyanosis edema Skin-warm and dry A & Oriented  Grossly normal sensory and motor function   TELEMETRY: Reviewed telemetry pt in sinus brady:    Intake/Output Summary (Last 24 hours) at 01/13/12 0904 Last data filed at 01/13/12 0025  Gross per 24 hour  Intake    640 ml  Output      0 ml  Net    640 ml    LABS: Basic Metabolic Panel:  Lab 01/13/12 4098 01/12/12 0847 01/07/12 1124  NA 143 140 141  K 4.7 4.0 3.9  CL 106 105 103  CO2 29 28 27   GLUCOSE 87 103* 112*  BUN 19 17 18   CREATININE 0.76 0.7 0.7  CALCIUM 9.4 8.9 --  MG 2.5 1.9 --  PHOS -- -- --   Cardiac Enzymes: No results found for this basename: CKTOTAL:3,CKMB:3,CKMBINDEX:3,TROPONINI:3 in the last 72 hours CBC: No results found for this basename: WBC:7,NEUTROABS:7,HGB:7,HCT:7,MCV:7,PLT:7 in the last 168 hours PROTIME:  Basename 01/12/12 1646  LABPROT 18.5*  INR 1.51*      ASSESSMENT AND PLAN:  Patient Active Hospital Problem List: Sinus bradycardia ()  Assessment: still a problem    Plan: wll stop pindolol Atrial fibrillation ()   Assessment: in sinus, will have to see how seh holds on tikosyn  With her QT interval now at 530 msec  QTc 51f 500 will reduce tikosyn 375 mcg bid   Plan:  Hypertension (01/06/2012)   Assessment:will add low dose amlodipine and hope we dont get bradycardia   Plan: will hold off for now so as not to confuste the picture and her BP is not so high as to be an acute problem Headache--maybe tikosyn  Better this am .   Signed, Sherryl Manges MD  01/13/2012

## 2012-01-13 NOTE — Progress Notes (Signed)
UR Completed. Simmons, Irelyn Perfecto F [phone]  

## 2012-01-13 NOTE — Progress Notes (Signed)
Clarified with Dr. Mayford Knife that is was ok to give pt her first dose of Tikosyn based on the EKG done 01/06/12. Dr. Mayford Knife also made aware that pt's HR was in the 50's.   About 30 minutes after the tikosyn was given the pt suddenly developed a head ache, dizziness and started feeling nauseated. VS stable.  Temp 98.0, BP 149/82, O2 97% ra, Hr 50's.  Dr. Mayford Knife made aware and new order for Zofran received. Pt stated she felt much better after she received her Zofran and denied any pain or dizziness. Will cont to monitor pt.

## 2012-01-14 ENCOUNTER — Other Ambulatory Visit: Payer: Self-pay

## 2012-01-14 DIAGNOSIS — I4891 Unspecified atrial fibrillation: Principal | ICD-10-CM

## 2012-01-14 LAB — BASIC METABOLIC PANEL
BUN: 22 mg/dL (ref 6–23)
CO2: 28 mEq/L (ref 19–32)
Calcium: 9.4 mg/dL (ref 8.4–10.5)
Chloride: 104 mEq/L (ref 96–112)
Creatinine, Ser: 0.82 mg/dL (ref 0.50–1.10)
GFR calc Af Amer: 83 mL/min — ABNORMAL LOW (ref >90)
GFR calc non Af Amer: 72 mL/min — ABNORMAL LOW (ref >90)
Glucose, Bld: 102 mg/dL — ABNORMAL HIGH (ref 70–99)
Potassium: 4.8 mEq/L (ref 3.5–5.1)
Sodium: 140 mEq/L (ref 135–145)

## 2012-01-14 LAB — MAGNESIUM: Magnesium: 2.2 mg/dL (ref 1.5–2.5)

## 2012-01-14 LAB — PROTIME-INR
INR: 1.31 (ref 0.00–1.49)
Prothrombin Time: 16.5 seconds — ABNORMAL HIGH (ref 11.6–15.2)

## 2012-01-14 MED ORDER — WARFARIN SODIUM 2.5 MG PO TABS
12.5000 mg | ORAL_TABLET | Freq: Once | ORAL | Status: AC
  Administered 2012-01-14: 12.5 mg via ORAL
  Filled 2012-01-14: qty 1

## 2012-01-14 MED ORDER — DOFETILIDE 250 MCG PO CAPS
375.0000 ug | ORAL_CAPSULE | Freq: Two times a day (BID) | ORAL | Status: DC
  Administered 2012-01-14 – 2012-01-16 (×5): 375 ug via ORAL
  Filled 2012-01-14 (×7): qty 1

## 2012-01-14 NOTE — Progress Notes (Signed)
ANTICOAGULATION CONSULT NOTE - Follow Up Consult  Pharmacy Consult for Coumadin Indication: atrial fibrillation  Allergies  Allergen Reactions  . Codeine Other (See Comments)    Skin crawls  . Prednisone Other (See Comments)    tachycardia    Patient Measurements: Height: 5\' 3"  (160 cm) Weight: 190 lb (86.183 kg) IBW/kg (Calculated) : 52.4  Heparin Dosing Weight:   Vital Signs: Temp: 98.6 F (37 C) (02/13 0500) Temp src: Oral (02/13 0500) BP: 135/73 mmHg (02/13 0500) Pulse Rate: 59  (02/13 0500)  Labs:  [name] 01/14/12 0519 01/13/12 0839 01/13/12 0515 01/12/12 1646 01/12/12 0847  HGB -- -- -- -- --  HCT -- -- -- -- --  PLT -- -- -- -- --  APTT -- -- -- -- --  LABPROT 16.5* 17.9* -- 18.5* --  INR 1.31 1.45 -- 1.51* --  HEPARINUNFRC -- -- -- -- --  CREATININE 0.82 -- 0.76 -- 0.7  CKTOTAL -- -- -- -- --  CKMB -- -- -- -- --  TROPONINI -- -- -- -- --   Estimated Creatinine Clearance: 68.3 ml/min (by C-G formula based on Cr of 0.82).  Assessment: 70yof on chronic Coumadin for afib. INR (1.31) is subtherapeutic despite receiving Coumadin 10mg  last night (PTA 7.5mg  daily except 5mg  on Sun). INR decrease is most likely due to missed dose on 2/11 (dose ordered but not charted). Will increase the dose to 12.5mg  to get INR moving. Pharmacy also asked to monitor for Tikosyn initiation. QTc 450-480 after MD dose adjustment on 2/12. Mg, K, CrCl within required limits.   Goal of Therapy:  INR 2-3   Plan:  1. Coumadin 12.5mg  po x 1 today 2. Follow-up AM INR, BMET, and Cardiology plan  Cleon Dew [phone] 01/14/2012,8:43 AM

## 2012-01-14 NOTE — Progress Notes (Signed)
  Patient Name: Julie Gill      SUBJECTIVE:doing well some sensation of palpitations last pm  Past Medical History  Diagnosis Date  . GERD (gastroesophageal reflux disease)   . Hyperlipidemia   . Hypertension   . Atrial fibrillation     CHADS VASC score 3  . Sleep disturbance   . Sinus bradycardia   . Heart murmur   . Shortness of breath   . Anxiety   . Arthritis     PHYSICAL EXAM Filed Vitals:   01/13/12 0937 01/13/12 1400 01/13/12 2047 01/14/12 0500  BP: 119/50 95/54 103/64 135/73  Pulse:  54 58 59  Temp:  98.6 F (37 C) 98 F (36.7 C) 98.6 F (37 C)  TempSrc:  Oral Oral Oral  Resp:  18 16 18   Height:      Weight:      SpO2:  95% 94% 93%    Well developed and nourished in no acute distress HENT normal Neck supple with JVP-flat Clear Regular rate and rhythm, no murmurs or gallops Abd-soft with active BS without hepatomegaly No Clubbing cyanosis edema Skin-warm and dry A & Oriented  Grossly normal sensory and motor function   TELEMETRY: Reviewed telemetry pt in sinus brady with occ runs of atach;; there are some wide complex beats which are irregular but are assoc wi  Preceding pwave  I  DO NOT think these are VT    Intake/Output Summary (Last 24 hours) at 01/14/12 0846 Last data filed at 01/14/12 0700  Gross per 24 hour  Intake    960 ml  Output      0 ml  Net    960 ml    LABS: Basic Metabolic Panel:  Lab 01/14/12 1610 01/13/12 0515 01/12/12 0847 01/07/12 1124  NA 140 143 140 141  K 4.8 4.7 4.0 3.9  CL 104 106 105 103  CO2 28 29 28 27   GLUCOSE 102* 87 103* 112*  BUN 22 19 17 18   CREATININE 0.82 0.76 0.7 0.7  CALCIUM 9.4 9.4 -- --  MG 2.2 2.5 -- --  PHOS -- -- -- --   Cardiac Enzymes: No results found for this basename: CKTOTAL:3,CKMB:3,CKMBINDEX:3,TROPONINI:3 in the last 72 hours CBC: No results found for this basename: WBC:7,NEUTROABS:7,HGB:7,HCT:7,MCV:7,PLT:7 in the last 168 hours PROTIME:  Basename 01/14/12 0519 01/13/12 0839  01/12/12 1646  LABPROT 16.5* 17.9* 18.5*  INR 1.31 1.45 1.51*   QTc 480 or so on last two 12 leads   ASSESSMENT AND PLAN:  Patient Active Hospital Problem List: Sinus bradycardia ()   Assessment: still a problem    Plan: may well come to pacing Atrial fibrillation ()   Assessment: in sinus, will have to see how she holds on tikosyn  With her QT interval now at 530 msec  QTc 47f 500 will reduce tikosyn 375 mcg bid   Plan: with non sustained atach, i am not sanguine that tikosyn is going to do the trick  She has appt to see dr Fawn Kirk also to consider PVI Hypertension (01/06/2012)   Assessment: better  Will continue lisinopril   Headache- Better this am .   Signed, Sherryl Manges MD  01/14/2012

## 2012-01-14 NOTE — Progress Notes (Signed)
Monitor tech notified RN that pt HR decreased to 30s-40s, sinus brady on the monitors but quickly returned to pt baseline HR of 50-60, sinus rhythm.  Pt sitting in bed at time of occurence, with complaint of feeling "lightheaded and dizzy."  Pt has hx of symptomatic bradycardia.  Julie Givens, NP for [facility] Cardiology notified. No new orders received.  Will continue to monitor pt. Efraim Kaufmann

## 2012-01-15 ENCOUNTER — Other Ambulatory Visit: Payer: Self-pay

## 2012-01-15 LAB — BASIC METABOLIC PANEL
BUN: 20 mg/dL (ref 6–23)
CO2: 29 mEq/L (ref 19–32)
Calcium: 9.2 mg/dL (ref 8.4–10.5)
Chloride: 105 mEq/L (ref 96–112)
Creatinine, Ser: 0.74 mg/dL (ref 0.50–1.10)
GFR calc Af Amer: >90 mL/min (ref >90)
GFR calc non Af Amer: 85 mL/min — ABNORMAL LOW (ref >90)
Glucose, Bld: 98 mg/dL (ref 70–99)
Potassium: 4.3 mEq/L (ref 3.5–5.1)
Sodium: 142 mEq/L (ref 135–145)

## 2012-01-15 LAB — MAGNESIUM: Magnesium: 2.1 mg/dL (ref 1.5–2.5)

## 2012-01-15 LAB — PROTIME-INR
INR: 1.51 — ABNORMAL HIGH (ref 0.00–1.49)
Prothrombin Time: 18.5 seconds — ABNORMAL HIGH (ref 11.6–15.2)

## 2012-01-15 MED ORDER — ZOLPIDEM TARTRATE 5 MG PO TABS: 5.0000 mg | ORAL_TABLET | Freq: Every evening | ORAL | Status: DC | PRN

## 2012-01-15 MED ORDER — WARFARIN SODIUM 7.5 MG PO TABS
7.5000 mg | ORAL_TABLET | Freq: Once | ORAL | Status: AC
  Administered 2012-01-15: 7.5 mg via ORAL
  Filled 2012-01-15: qty 1

## 2012-01-15 NOTE — Progress Notes (Addendum)
Patient had a 6 beat run of V-tach. Vitals T 97.5 P 67 R 19 BP 151/68 O2 96 RA. EKG showed NSR, with prolonged QTC at 497 ms. Dr. Charm Barges was notified and stopped by. Will continue to monitor.

## 2012-01-15 NOTE — Progress Notes (Signed)
   CARE MANAGEMENT NOTE 01/15/2012  Patient:  Julie Gill, Julie Gill   Account Number:  [id number]  Date Initiated:  01/15/2012  Documentation initiated by:  GRAVES-BIGELOW,Kisean Rollo  Subjective/Objective Assessment:   Pt admitted for tikosyn load. Plan for d/c today.     Action/Plan:   MD please write 7 day rx to be filled in main pharmacy no refills along with original rx with refills.  CM will send rx to main pharmacy. Pt uses [facility] pharmacy and CM will call to make sure can order medication.   Anticipated DC Date:  01/15/2012   Anticipated DC Plan:  HOME/SELF CARE      DC Planning Services  CM consult      Choice offered to / List presented to:             Status of service:  Completed, signed off Medicare Important Message given?   (If response is "NO", the following Medicare IM given date fields will be blank) Date Medicare IM given:   Date Additional Medicare IM given:    Discharge Disposition:  HOME/SELF CARE  Per UR Regulation:    Comments:  01-15-12 0940 Tomi Bamberger, RN,BSN [phone] [facility] can order medicaiton and it will take 3-4 days to get in from the time the rx is dropped off. CM has benefits check in process. Test claim at [facility] stated that price would be 95.00 for 30 day supply. No other needs assessed by CM at this time. Thanks

## 2012-01-15 NOTE — Progress Notes (Signed)
ANTICOAGULATION CONSULT NOTE - Follow Up Consult  Pharmacy Consult for Coumadin Indication: atrial fibrillation  Allergies  Allergen Reactions  . Codeine Other (See Comments)    Skin crawls  . Prednisone Other (See Comments)    tachycardia    Patient Measurements: Height: 5\' 3"  (160 cm) Weight: 190 lb (86.183 kg) IBW/kg (Calculated) : 52.4   Vital Signs: Temp: 97.6 F (36.4 C) (02/14 0500) Temp src: Oral (02/14 0500) BP: 105/39 mmHg (02/14 0900) Pulse Rate: 72  (02/14 0900)  Labs:  Basename 01/15/12 0500 01/14/12 0519 01/13/12 0839 01/13/12 0515  HGB -- -- -- --  HCT -- -- -- --  PLT -- -- -- --  APTT -- -- -- --  LABPROT 18.5* 16.5* 17.9* --  INR 1.51* 1.31 1.45 --  HEPARINUNFRC -- -- -- --  CREATININE 0.74 0.82 -- 0.76  CKTOTAL -- -- -- --  CKMB -- -- -- --  TROPONINI -- -- -- --   Estimated Creatinine Clearance: 70 ml/min (by C-G formula based on Cr of 0.74).  Assessment: 71 yo F on Coumadin PTA for Afib.  INR is trending up after two larger doses given.  Will reduce dose to home dose tonight.  Anticipate further INR increase.  Pharmacy also asked to monitor for Tikosyn initiation. QTc 497 after MD dose adjustment on 2/12. Mg, K, CrCl within required limits.   Goal of Therapy:  INR 2-3   Plan:  1. Coumadin 7.5mg  po x 1 today 2. Follow-up AM INR, BMET, and Cardiology plan   [facility], Pharm.D., BCPS Clinical Pharmacist Pager [phone]  01/15/2012,9:48 AM

## 2012-01-15 NOTE — Progress Notes (Signed)
Patient ID: Julie Gill, female   DOB: Dec 31, 1940, 71 y.o.   MRN: [id number] Subjective:  Feels tired today. No palpitations.  Objective:  Vital Signs in the last 24 hours: Temp:  [97.5 F (36.4 C)-98.3 F (36.8 C)] 97.6 F (36.4 C) (02/14 0500) Pulse Rate:  [64-72] 72  (02/14 0900) Resp:  [18-19] 18  (02/14 0900) BP: (105-151)/(39-69) 105/39 mmHg (02/14 0900) SpO2:  [92 %-96 %] 96 % (02/14 0900)  Intake/Output from previous day:   Intake/Output from this shift: Total I/O In: 243 [P.O.:240; I.V.:3] Out: -   Physical Exam: Well appearing middle aged woman, NAD HEENT: Unremarkable Neck:  No JVD, no thyromegally Lymphatics:  No adenopathy Back:  No CVA tenderness Lungs:  Clear with no wheezes, rales, or rhonchi HEART:  Regular rate rhythm, no murmurs, no rubs, no clicks Abd:  Flat, positive bowel sounds, no organomegally, no rebound, no guarding Ext:  2 plus pulses, no edema, no cyanosis, no clubbing Skin:  No rashes no nodules Neuro:  CN II through XII intact, motor grossly intact  Lab Results: No results found for this basename: WBC:2,HGB:2,PLT:2 in the last 72 hours  Basename 01/15/12 0500 01/14/12 0519  NA 142 140  K 4.3 4.8  CL 105 104  CO2 29 28  GLUCOSE 98 102*  BUN 20 22  CREATININE 0.74 0.82   No results found for this basename: TROPONINI:2,CK,MB:2 in the last 72 hours Hepatic Function Panel No results found for this basename: PROT,ALBUMIN,AST,ALT,ALKPHOS,BILITOT,BILIDIR,IBILI in the last 72 hours No results found for this basename: CHOL in the last 72 hours No results found for this basename: PROTIME in the last 72 hours  Imaging: No results found.  Cardiac Studies: ECG - NSR with QTC 480 Assessment/Plan:  1. Atrial fib - she is tolerating Tikosyn. Agree with decrease in dose. Will watch today, ambulate in halls and plan to discharge tomorrow.   LOS: 3 days    Lewayne Bunting 01/15/2012, 10:42 AM

## 2012-01-16 ENCOUNTER — Telehealth: Payer: Self-pay | Admitting: Internal Medicine

## 2012-01-16 ENCOUNTER — Other Ambulatory Visit: Payer: Self-pay

## 2012-01-16 LAB — PROTIME-INR
INR: 1.99 — ABNORMAL HIGH (ref 0.00–1.49)
Prothrombin Time: 22.9 seconds — ABNORMAL HIGH (ref 11.6–15.2)

## 2012-01-16 MED ORDER — LISINOPRIL 20 MG PO TABS: 20.0000 mg | ORAL_TABLET | Freq: Every day | ORAL | Status: DC

## 2012-01-16 MED ORDER — WARFARIN SODIUM 5 MG PO TABS: 5.0000 mg | ORAL_TABLET | ORAL | Status: DC

## 2012-01-16 MED ORDER — WARFARIN SODIUM 7.5 MG PO TABS
7.5000 mg | ORAL_TABLET | ORAL | Status: DC
  Filled 2012-01-16: qty 1

## 2012-01-16 MED ORDER — DOFETILIDE 125 MCG PO CAPS: 375.0000 ug | ORAL_CAPSULE | Freq: Two times a day (BID) | ORAL | Status: DC

## 2012-01-16 NOTE — Progress Notes (Signed)
ANTICOAGULATION CONSULT NOTE - Follow Up Consult  Pharmacy Consult for Coumadin Indication: atrial fibrillation  Allergies  Allergen Reactions  . Codeine Other (See Comments)    Skin crawls  . Prednisone Other (See Comments)    tachycardia    Patient Measurements: Height: 5\' 3"  (160 cm) Weight: 190 lb (86.183 kg) IBW/kg (Calculated) : 52.4   Vital Signs: Temp: 98.1 F (36.7 C) (02/15 0500) Temp src: Oral (02/15 0500) BP: 148/89 mmHg (02/15 0500) Pulse Rate: 71  (02/15 0500)  Labs:  Basename 01/16/12 0600 01/15/12 0500 01/14/12 0519  HGB -- -- --  HCT -- -- --  PLT -- -- --  APTT -- -- --  LABPROT 22.9* 18.5* 16.5*  INR 1.99* 1.51* 1.31  HEPARINUNFRC -- -- --  CREATININE -- 0.74 0.82  CKTOTAL -- -- --  CKMB -- -- --  TROPONINI -- -- --   Estimated Creatinine Clearance: 70 ml/min (by C-G formula based on Cr of 0.74).  Assessment: 71 yo F on Coumadin PTA for Afib.  INR now near goal.  Will resume home dose tonight.    Pharmacy also asked to monitor for Tikosyn initiation. QTc 464 after MD dose adjustment on 2/12. Mg, K, CrCl within required limits.   Goal of Therapy:  INR 2-3   Plan:  1. Coumadin 7.5mg  po daily except 5mg  on Sundays. 2. Follow-up AM INR, BMET, and Cardiology plan   [facility], Pharm.D., BCPS Clinical Pharmacist Pager [phone]  01/16/2012,10:14 AM

## 2012-01-16 NOTE — Progress Notes (Signed)
Subjective:   Julie Gill is a 71 year old female with a history of atrial fibrillation. She was started on Tikosyn and converted to sinus rhythm. Her Tikosyn does was decreased slightly because of a prolonged QT interval. She has maintained sinus rhythm.     . calcium carbonate  0.5 tablet Oral QPC supper  . calcium carbonate  1 tablet Oral Q breakfast  . cholecalciferol  4,000 Units Oral Daily  . dofetilide  375 mcg Oral Q12H  . lisinopril  20 mg Oral Daily  . mulitivitamin with minerals  1 tablet Oral Daily  . omega-3 acid ethyl esters  1 g Oral Daily  . pantoprazole  40 mg Oral Q0600  . polyethylene glycol  17 g Oral Daily  . potassium chloride SA  20 mEq Oral Daily  . simvastatin  40 mg Oral q1800  . sodium chloride  3 mL Intravenous Q12H  . vitamin E  800 Units Oral Daily  . warfarin  7.5 mg Oral ONCE-1800      Objective:  Vital Signs in the last 24 hours: Blood pressure 148/89, pulse 71, temperature 98.1 F (36.7 C), temperature source Oral, resp. rate 18, height 5\' 3"  (1.6 m), weight 190 lb (86.183 kg), SpO2 97.00%. Temp:  [97.9 F (36.6 C)-98.1 F (36.7 C)] 98.1 F (36.7 C) (02/15 0500) Pulse Rate:  [61-71] 71  (02/15 0500) Resp:  [18] 18  (02/15 0500) BP: (115-148)/(57-89) 148/89 mmHg (02/15 0500) SpO2:  [95 %-97 %] 97 % (02/15 0500)  Intake/Output from previous day: 02/14 0701 - 02/15 0700 In: 963 [P.O.:960; I.V.:3] Out: -  Intake/Output from this shift:    Physical Exam:  Physical Exam: Blood pressure 148/89, pulse 71, temperature 98.1 F (36.7 C), temperature source Oral, resp. rate 18, height 5\' 3"  (1.6 m), weight 190 lb (86.183 kg), SpO2 97.00%. General: Well developed, well nourished, in no acute distress. Head: Normocephalic, atraumatic, sclera non-icteric, mucus membranes are moist,  Neck: Supple. Negative for carotid bruits. JVD not elevated. Lungs: Clear bilaterally to auscultation without wheezes, rales, or rhonchi. Breathing is  unlabored. Heart: RRR with S1 S2. No murmurs, rubs, or gallops appreciated. Abdomen: Soft, non-tender, non-distended with normoactive bowel sounds. No hepatomegaly. No rebound/guarding. No obvious abdominal masses. Msk:  Strength and tone appear normal for age. Extremities: No clubbing or cyanosis. No edema.  Distal pedal pulses are 2+ and equal bilaterally. Neuro: Alert and oriented X 3. Moves all extremities spontaneously. Psych:  Responds to questions appropriately with a normal affect.    Lab Results:   Basename 01/15/12 0500 01/14/12 0519  NA 142 140  K 4.3 4.8  CL 105 104  CO2 29 28  GLUCOSE 98 102*  BUN 20 22  CREATININE 0.74 0.82  CALCIUM 9.2 9.4  MG 2.1 2.2  PHOS -- --   No results found for this basename: AST:2,ALT:2,ALKPHOS:2,BILITOT:2,PROT:2,ALBUMIN:2 in the last 72 hours No results found for this basename: LIPASE:2,AMYLASE:2 in the last 72 hours No results found for this basename: WBC:2,NEUTROABS:2,HGB:2,HCT:2,MCV:2,PLT:2 in the last 72 hours No results found for this basename: CKTOTAL:4,CKMB:4,TROPONINI:4 in the last 72 hours No components found with this basename: POCBNP:3 No results found for this basename: DDIMER in the last 72 hours No results found for this basename: HGBA1C in the last 72 hours No results found for this basename: CHOL,HDL,LDLCALC,TRIG,CHOLHDL in the last 72 hours No results found for this basename: TSH,T4TOTAL,FREET3,T3FREE,THYROIDAB in the last 72 hours No results found for this basename: VITAMINB12,FOLATE,FERRITIN,TIBC,IRON,RETICCTPCT in the last 72 hours  Normal sinus rhythm. QT interval looks okay on telemetry. EKG is pending.  Assessment/Plan:   1. Atrial fibrillation: The patient has converted to normal sinus rhythm. She was not adequately controlled on flecainide. Her QT interval has been acceptable.  She is on Coumadin. Her INR levels are monitored by her primary medical doctor-Dr. Selena Gill.  We will check an EKG to make sure  that her QT interval is okay. If so, she'll be discharged later today.  2. Hypertension: Continue with current medications.  Dr. Graciela Gill as mentioned schedule her for a sleep study on previous meds.  Hyperlipidemia: The patient is currently on simvastatin. Further recommendations per Dr. Graciela Gill as an outpatient.    Vesta Mixer, Montez Hageman., MD, [facility] 01/16/2012, 9:20 AM LOS: Day 4

## 2012-01-16 NOTE — Telephone Encounter (Signed)
Spoke with pharm, questions answered

## 2012-01-16 NOTE — Discharge Summary (Signed)
Discharge Summary   Patient ID: Julie Gill MRN: [id number], DOB/AGE: 1941-06-15 71 y.o.  Primary MD: [name], MD Primary Cardiologist: Dr. Graciela Husbands  Admit date: 01/12/2012 D/C date:     01/16/2012      Primary Discharge Diagnoses:  1. Atrial Fibrillation/Flutter w/ RVR  - Headache w/ flecainide, Bradycardia w/ Pindolol  - Initiated Tikosyn this admission  - QTc prolonged to 530 and Tikosyn was decreased to  - QTc 430 on dc  - F/u with Dr. Johney Frame for consideration of afib ablation  - CHADSVASC score 3, on coumadin  2. Symptomatic Sinus Bradycardia  - Pindolol dc'd  Secondary Discharge Diagnoses:  1. HTN - HCTZ dc'd for Tikosyn initiation, Lisinopril continued 2. HLD 3. GERD 4. Sleep disturbance  Allergies Allergen Reactions  . Codeine Skin crawls  . Prednisone tachycardia   Diagnostic Studies/Procedures:  None  History of Present Illness: 65 yof w/ PMHx significant for HTN, HLD, Symptomatic Sinus Bradycardia, and A.fib/flutter w/ RVR who presented to [facility] on 01/12/12 for Tikosyn loading and observation.  She has a history of paroxysmal atrial fibrillation/flutter and has been tried on flecainide which was complicated by significant headache that resolved with its discontinuation and pindolol which has been complicated by bradycardia. She was seen in clinic by Dr. Graciela Husbands on 01/06/12 w/ c/o of recurrent palpitations w/ rvr as well as bradycardia. After discussions with the patient she agreed to try Tikosyn and be admitted for loading and observation.  Hospital Course: She presented to [facility] on 11/10/12 for Tikosyn loading and was admitted for observation. On presentation she was in atrial fibrillation and converted to sinus rhythm with Tikosyn. Her QTc prolonged to after the first dose and therefore Tikosyn was decreased to . Electrolytes and EKG were monitored throughout hospitalization with supplementation as needed. Pindolol  was discontinued as she continued to have symptomatic sinus bradycardia. She tolerated the Tikosyn well with some initial nausea, headache and dizziness that resolved after the first dose. On telemetry she had some occasional runs of atrial tachycardia as well as some wide complex irregular beats, but no VTach.   She was seen and evaluated by Dr. Elease Hashimoto who felt she was stable for discharge home with plans for follow up as scheduled below. She will follow up with Dr. Johney Frame to discuss consideration of afib ablation.  Discharge Vitals: Blood pressure 148/89, pulse 71, temperature 98.1 F (36.7 C), temperature source Oral, resp. rate 18, height 5\' 3"  (1.6 m), weight 190 lb (86.183 kg), SpO2 97.00%.  Labs:   Lab 01/15/12 0500  NA 142  K 4.3  CL 105  CO2 29  BUN 20  CREATININE 0.74  CALCIUM 9.2  GLUCOSE 98     01/16/2012 06:00  Prothrombin Time 22.9 (H)  INR 1.99 (H)    Discharge Medications   Medication List  As of 01/16/2012 11:42 AM   STOP taking these medications         lisinopril-hydrochlorothiazide 20-25 MG per tablet      pindolol 5 MG tablet         TAKE these medications         calcium carbonate 600 MG Tabs   Commonly known as: OS-CAL   Take 300-600 mg by mouth See admin instructions. Takes 1 tablet in the morning and 1/2 tablet at night      clonazePAM 0.5 MG tablet   Commonly known as: KLONOPIN   Take 0.25 mg by mouth 2 (two)  times daily as needed. For stress      dofetilide 125 MCG capsule   Commonly known as: TIKOSYN   Take 3 capsules (375 mcg total) by mouth every 12 (twelve) hours.      fish oil-omega-3 fatty acids 1000 MG capsule   Take two capsules by mouth twice daily      lisinopril 20 MG tablet   Commonly known as: PRINIVIL,ZESTRIL   Take 1 tablet (20 mg total) by mouth daily.      Magnesium 200 MG Tabs   Take 400 mg by mouth daily.      multivitamin with minerals tablet   Take 1 tablet by mouth daily.      polyethylene glycol packet    Commonly known as: MIRALAX / GLYCOLAX   Take 17 g by mouth daily.      potassium chloride SA 20 MEQ tablet   Commonly known as: K-DUR,KLOR-CON   Take 20 mEq by mouth daily. Take one tablet by mouth once daily      pravastatin 40 MG tablet   Commonly known as: PRAVACHOL   Take 80 mg by mouth daily.      VITAMIN D PO   Take 4,000 Units by mouth daily.      vitamin E 400 UNIT capsule   Generic drug: vitamin E   Take 800 Units by mouth daily.      warfarin 5 MG tablet   Commonly known as: COUMADIN   Take 5-7.5 mg by mouth See admin instructions. Takes 1 tablet (5mg ) on Sunday, and 1.5 tablets (7.5mg ) Monday thru Saturday            Disposition   Discharge Orders    Future Appointments: Provider: Department: Dept Phone: Center:   01/19/2012 3:15 PM Gardiner Rhyme, MD [facility] [phone] [id number]     Future Orders Please Complete By Expires   Diet - low sodium heart healthy      Increase activity slowly      Discharge instructions      Comments:   **PLEASE REMEMBER TO BRING ALL OF YOUR MEDICATIONS TO EACH OF YOUR FOLLOW-UP OFFICE VISITS.       Follow-up Information    Follow up with Hillis Range, MD on 01/19/2012. (3:15)    Contact information:   [facility] [address] [place], [address] [place] [address] [phone]       Follow up with [facility], MD. (As needed)    Contact information:   [address], [address] [place] [address] [phone]           Outstanding Labs/Studies:  1. Outpatient Sleep Study  Duration of Discharge Encounter: Greater than 30 minutes including physician and PA time.  Signed, HOPE, JESSICA PA-C 01/16/2012, 11:42 AM  Attending Note:   The patient was seen and examined.  Agree with assessment and plan as noted above. See my note from earlier today.  QTC is normal   Vesta Mixer, Montez Hageman., MD, [facility] 01/16/2012, 2:48 PM

## 2012-01-16 NOTE — Telephone Encounter (Signed)
New Problem:     Pharmacy rep called to say that there is a drug-Drug interaction with the patients dofetilide (TIKOSYN) 125 MCG capsule and her blood pressure medication.  They wanted to know if you still wanted to continue with the current drug therapy. Please call back.

## 2012-01-18 ENCOUNTER — Telehealth: Payer: Self-pay | Admitting: Physician Assistant

## 2012-01-18 NOTE — Telephone Encounter (Signed)
Patient called to report that she had an episode of atrial fibrillation last night from 9pm-12am. She was able to feel her heart out of rhythm but no CP, SOB, syncope. She took a "nerve pill" and the palpitations went away. She feels well this morning and believes she is in normal rhythm, just wanted to report this episode. She has appt on Monday. I told her to continue current med regimen and keep appointment. She verbalized understanding and gratitude.  Julie Larue PA-C

## 2012-01-19 ENCOUNTER — Ambulatory Visit (INDEPENDENT_AMBULATORY_CARE_PROVIDER_SITE_OTHER): Payer: Medicare Other | Admitting: Internal Medicine

## 2012-01-19 ENCOUNTER — Encounter: Payer: Self-pay | Admitting: Internal Medicine

## 2012-01-19 ENCOUNTER — Encounter: Payer: Self-pay | Admitting: *Deleted

## 2012-01-19 VITALS — BP 118/68 | HR 53 | Wt 192.1 lb

## 2012-01-19 DIAGNOSIS — I4891 Unspecified atrial fibrillation: Secondary | ICD-10-CM

## 2012-01-19 DIAGNOSIS — R001 Bradycardia, unspecified: Secondary | ICD-10-CM

## 2012-01-19 DIAGNOSIS — I498 Other specified cardiac arrhythmias: Secondary | ICD-10-CM

## 2012-01-19 NOTE — Assessment & Plan Note (Signed)
The patient has symptomatic paroxysmal atrial fibrillation.  She has failed medical therapy with flecainide and tikosyn. She is appropriately anticoagulated with coumadin. Therapeutic strategies for afib including medicine and ablation were discussed in detail with the patient today. Risk, benefits, and alternatives to EP study and radiofrequency ablation for afib were also discussed in detail today. These risks include but are not limited to stroke, bleeding, vascular damage, tamponade, perforation, damage to the esophagus, lungs, and other structures, pulmonary vein stenosis, worsening renal function, and death. The patient understands these risk and wishes to proceed.  We will therefore proceed with catheter ablation at the next available time.

## 2012-01-19 NOTE — Progress Notes (Signed)
Primary Care Physician: [name], MD, MD Referring Physician:  Dr Leonie Man Faithe Julie Gill is a 71 y.o. female with a h/o paroxysmal atrial fibrillation who presents today for EP follow-up.  She reports initially being diagnosed with afib 11/12 after presenting with tachypalpitations.  In retrospect, she feels that she may have had afib 5-6 months prior.  She describes increasing frequency and duration of afib since that time.  She was initiated on flecainide but did not tolerate this medication due to headaches.  She was most recently placed on tikosyn 375mg  BID.  She has had at least one symptomatic episode of afib since initiation of this medication, lasting several hours.  She is unaware of triggers or precipitants for afib, though she thinks stress may contribute.  Episodes typically occur at night.  She reports having a sleep study 4-5 years ago which revealed "mild sleep apnea" and for which CPAP was not advised (per pt).  She feels that she is tolerating tikosyn well.  She is on coumadin for stroke prevention. Today, she denies symptoms of chest pain, shortness of breath, orthopnea, PND, lower extremity edema, dizziness, presyncope, syncope, or neurologic sequela. The patient is tolerating medications without difficulties and is otherwise without complaint today.   Past Medical History  Diagnosis Date  . GERD (gastroesophageal reflux disease)   . Hyperlipidemia   . Hypertension   . Atrial fibrillation     CHADS VASC score 3  . Mild sleep apnea   . Sinus bradycardia   . Restless leg syndrome   . Anxiety   . Arthritis   . Obesity    Past Surgical History  Procedure Date  . Abdominal hysterectomy   . Tonsillectomy and adenoidectomy   . Bladder suspension   . Colonoscopy w/ polypectomy 1995, [id number], 2007, 05/06/2011    adenomas  . Appendectomy   . Varicose vein surgery     Current Outpatient Prescriptions  Medication Sig Dispense Refill  . aspirin 325 MG tablet Take 325 mg  by mouth daily.      . calcium carbonate (OS-CAL) 600 MG TABS Take 300-600 mg by mouth See admin instructions. Takes 1 tablet in the morning and 1/2 tablet at night      . Cholecalciferol (VITAMIN D PO) Take 4,000 Units by mouth daily.        . clonazePAM (KLONOPIN) 0.5 MG tablet Take 0.25 mg by mouth 2 (two) times daily as needed. For stress      . dofetilide (TIKOSYN) 125 MCG capsule Take 3 capsules (375 mcg total) by mouth every 12 (twelve) hours.  180 capsule  6  . fish oil-omega-3 fatty acids 1000 MG capsule Take two capsules by mouth twice daily      . lisinopril (PRINIVIL,ZESTRIL) 20 MG tablet Take 1 tablet (20 mg total) by mouth daily.  30 tablet  6  . Magnesium 200 MG TABS Take 400 mg by mouth daily.       . Multiple Vitamins-Minerals (MULTIVITAMIN WITH MINERALS) tablet Take 1 tablet by mouth daily.        . pindolol (VISKEN) 5 MG tablet Take 5 mg by mouth 2 (two) times daily.      . polyethylene glycol (MIRALAX / GLYCOLAX) packet Take 17 g by mouth daily.      . potassium chloride SA (K-DUR,KLOR-CON) 20 MEQ tablet Take 20 mEq by mouth daily.      . pravastatin (PRAVACHOL) 40 MG tablet Take 80 mg by mouth daily.       Marland Kitchen  ranitidine (ZANTAC) 150 MG capsule Take 150 mg by mouth daily.      . vitamin E (VITAMIN E) 400 UNIT capsule Take 800 Units by mouth daily.      Marland Kitchen warfarin (COUMADIN) 5 MG tablet Take 5-7.5 mg by mouth See admin instructions. Takes 1 tablet (5mg ) on Sunday, and 1.5 tablets (7.5mg ) Monday thru Saturday        Allergies  Allergen Reactions  . Codeine Other (See Comments)    Skin crawls  . Prednisone Other (See Comments)    tachycardia    History   Social History  . Marital Status: Married    Spouse Name: N/A    Number of Children: N/A  . Years of Education: N/A   Occupational History  . Not on file.   Social History Main Topics  . Smoking status: Former Smoker    Quit date: 12/01/1984  . Smokeless tobacco: Never Used  . Alcohol Use: No  . Drug Use: No    . Sexually Active: Not Currently    Birth Control/ Protection: Post-menopausal   Other Topics Concern  . Not on file   Social History Narrative   Pt lives in [place] with spouse.  2 grown children.Retired Tax inspector.    Family History  Problem Relation Age of Onset  . Colon cancer Maternal Uncle   . Colon cancer Maternal Grandmother   . Diabetes Brother   . Heart failure Mother   . Lung cancer Father     ROS- All systems are reviewed and negative except as per the HPI above  Physical Exam: Filed Vitals:   01/19/12 1502  BP: 118/68  Pulse: 53  Weight: 192 lb 1.9 oz (87.145 kg)    GEN- The patient is overweight appearing, alert and oriented x 3 today.   Head- normocephalic, atraumatic Eyes-  Sclera clear, conjunctiva pink Ears- hearing intact Oropharynx- clear Neck- supple, no JVP Lymph- no cervical lymphadenopathy Lungs- Clear to ausculation bilaterally, normal work of breathing Heart- Regular rate and rhythm, no murmurs, rubs or gallops, PMI not laterally displaced GI- soft, NT, ND, + BS Extremities- no clubbing, cyanosis, or edema MS- no significant deformity or atrophy Skin- no rash or lesion Psych- euthymic mood, full affect Neuro- strength and sensation are intact  EKG today reveals sinus bradycardia 53 bpm, PR 142, QRS 84, Qtc 461 Echo 09/22/11- LVEF 60-65%, mild LVH, mild MR, LA size 41mm  Assessment and Plan:

## 2012-01-19 NOTE — Patient Instructions (Signed)

## 2012-01-19 NOTE — Assessment & Plan Note (Signed)
Stable No change required today  

## 2012-01-20 ENCOUNTER — Telehealth: Payer: Self-pay | Admitting: Internal Medicine

## 2012-01-20 ENCOUNTER — Other Ambulatory Visit: Payer: Self-pay | Admitting: *Deleted

## 2012-01-20 DIAGNOSIS — I4891 Unspecified atrial fibrillation: Secondary | ICD-10-CM

## 2012-01-20 NOTE — Telephone Encounter (Signed)
Pt needs to talk to you about her medication she was taking one dose when she went in and they changed the dose while she was in the hospital and she needs to know which dose she needs to be on now that she is home

## 2012-01-20 NOTE — Telephone Encounter (Signed)
I spoke with the patient. We reviewed all of her discharge medications and she verbalizes understanding of what she should be taking. She was discharged on only lisinopril 20 mg once daily. We had increased her lisinopril to 40 mg prior to Tikosyn being initiated because she was on lisinopril 20 mg once daily and lisinopril/hctz 20/25 mg once daily. We d/c'ed the lisinopril/hctz prior to starting Tikosyn due to the contraindication of Tikosyn and HCTZ. I have instructed the patient to monitor her BP's and call if her SBP is sustained above 140. We can then possibly add back the additional 20 mg of lisinopril to total 40 mg once daily.

## 2012-01-22 ENCOUNTER — Telehealth: Payer: Self-pay | Admitting: Internal Medicine

## 2012-01-22 ENCOUNTER — Encounter: Payer: Self-pay | Admitting: Internal Medicine

## 2012-01-22 LAB — PROTIME-INR

## 2012-01-22 NOTE — Telephone Encounter (Signed)
Spoke with pt, she is taking a 125 mcg and a 250 mcg tikosyn. Pharm md is going to try to get samples from the hosp. Will call pt back when we get those.

## 2012-01-22 NOTE — Telephone Encounter (Signed)
Pt calling re medication, pt running out of tikosyn, but pharmacy out and has to reorder and doesn't when it will come in, pt still having a-fib every day  since Saturday even on tikosyn and only has a few pills left,   Or can reach pt on cell [phone] or dtr tammy tew [phone]

## 2012-01-22 NOTE — Telephone Encounter (Signed)
Pt at the front desk and one weeks worth of tikosyn samples given

## 2012-01-30 ENCOUNTER — Encounter (HOSPITAL_COMMUNITY): Payer: Self-pay | Admitting: Pharmacy Technician

## 2012-01-30 ENCOUNTER — Encounter: Payer: Self-pay | Admitting: Internal Medicine

## 2012-01-30 LAB — PROTIME-INR

## 2012-02-03 ENCOUNTER — Other Ambulatory Visit: Payer: Self-pay | Admitting: Internal Medicine

## 2012-02-03 DIAGNOSIS — I10 Essential (primary) hypertension: Secondary | ICD-10-CM

## 2012-02-03 MED ORDER — LISINOPRIL 40 MG PO TABS: 40.0000 mg | ORAL_TABLET | Freq: Every day | ORAL | Status: DC

## 2012-02-03 NOTE — Telephone Encounter (Signed)
New problem:  Refill medication.

## 2012-02-05 ENCOUNTER — Telehealth: Payer: Self-pay | Admitting: *Deleted

## 2012-02-05 ENCOUNTER — Other Ambulatory Visit (INDEPENDENT_AMBULATORY_CARE_PROVIDER_SITE_OTHER): Payer: Medicare Other

## 2012-02-05 DIAGNOSIS — I4891 Unspecified atrial fibrillation: Secondary | ICD-10-CM

## 2012-02-05 LAB — CBC WITH DIFFERENTIAL/PLATELET
Basophils Absolute: 0 10*3/uL (ref 0.0–0.1)
Basophils Relative: 0.2 % (ref 0.0–3.0)
Eosinophils Absolute: 0.1 10*3/uL (ref 0.0–0.7)
Eosinophils Relative: 1.3 % (ref 0.0–5.0)
HCT: 36.4 % (ref 36.0–46.0)
Hemoglobin: 11.9 g/dL — ABNORMAL LOW (ref 12.0–15.0)
Lymphocytes Relative: 34.3 % (ref 12.0–46.0)
Lymphs Abs: 1.9 10*3/uL (ref 0.7–4.0)
MCHC: 32.8 g/dL (ref 30.0–36.0)
MCV: 86.3 fl (ref 78.0–100.0)
Monocytes Absolute: 0.5 10*3/uL (ref 0.1–1.0)
Monocytes Relative: 8 % (ref 3.0–12.0)
Neutro Abs: 3.2 10*3/uL (ref 1.4–7.7)
Neutrophils Relative %: 56.2 % (ref 43.0–77.0)
Platelets: 240 10*3/uL (ref 150.0–400.0)
RBC: 4.22 Mil/uL (ref 3.87–5.11)
RDW: 14.8 % — ABNORMAL HIGH (ref 11.5–14.6)
WBC: 5.6 10*3/uL (ref 4.5–10.5)

## 2012-02-05 LAB — BASIC METABOLIC PANEL
BUN: 15 mg/dL (ref 6–23)
CO2: 28 mEq/L (ref 19–32)
Calcium: 9.5 mg/dL (ref 8.4–10.5)
Chloride: 106 mEq/L (ref 96–112)
Creatinine, Ser: 0.7 mg/dL (ref 0.4–1.2)
GFR: 85.44 mL/min (ref >60.00)
Glucose, Bld: 76 mg/dL (ref 70–99)
Potassium: 4.3 mEq/L (ref 3.5–5.1)
Sodium: 142 mEq/L (ref 135–145)

## 2012-02-05 LAB — PROTIME-INR
INR: 1.8 ratio — ABNORMAL HIGH (ref 0.8–1.0)
Prothrombin Time: 20.3 s — ABNORMAL HIGH (ref 10.2–12.4)

## 2012-02-05 NOTE — Telephone Encounter (Signed)
lmom for patient that she needs to increase her Coumadin  Please call the office to let me know she got message.  Dr Uvaldo Rising follows

## 2012-02-05 NOTE — Telephone Encounter (Signed)
Per Dr Johney Frame to take 7.5mg  this Sunday

## 2012-02-05 NOTE — Telephone Encounter (Signed)
Pt just wanted [place] to know that she is still going in and out of a-fib.  Advised pt to continue current meds and keep a-fib ablation appt with Dr. Johney Frame on 02/12/12.

## 2012-02-06 ENCOUNTER — Telehealth: Payer: Self-pay

## 2012-02-06 ENCOUNTER — Other Ambulatory Visit: Payer: Self-pay | Admitting: *Deleted

## 2012-02-06 DIAGNOSIS — I4891 Unspecified atrial fibrillation: Secondary | ICD-10-CM

## 2012-02-06 NOTE — Telephone Encounter (Signed)
Spoke with pt.  Pt called pending ablation with Dr Johney Frame on 02/12/12.  Pt had labs drawn on 02/05/12 INR was 1.8, pt does not have Coumadin monitored here at our office, she follows with her primary MD Dr Uvaldo Rising.  Pt used to follow in our clinic, she is currently taking 7.5mg  daily except 5mg  on Sundays.  Advised pt to take 10mg  today then resume normal dosage as her INR's have been steady in the past 3/1 INR 2.5 2/21 INR was 2.3.  Advised pt I would forward this note to [place] who is out of the office today to verify still ok with low INR to proceed with ablation or if they have any further recommendations or changes to make.

## 2012-02-09 NOTE — Telephone Encounter (Signed)
Fu call Patient returning your call. She said she could not get in with her pcp Please call her back

## 2012-02-09 NOTE — Telephone Encounter (Signed)
She will come her tomorrow at 90

## 2012-02-09 NOTE — Telephone Encounter (Signed)
Spoke with patient She did get my message this past Thurs and made the adjustments to her Coumadin.  She is going to try and have it checked again tomorrow at Dr Danny Lawless office  She will call me back if they can not check it for her

## 2012-02-10 ENCOUNTER — Other Ambulatory Visit (INDEPENDENT_AMBULATORY_CARE_PROVIDER_SITE_OTHER): Payer: Medicare Other

## 2012-02-10 DIAGNOSIS — I4891 Unspecified atrial fibrillation: Secondary | ICD-10-CM

## 2012-02-10 LAB — PROTIME-INR
INR: 2.3 ratio — ABNORMAL HIGH (ref 0.8–1.0)
Prothrombin Time: 25.9 s — ABNORMAL HIGH (ref 10.2–12.4)

## 2012-02-10 NOTE — Telephone Encounter (Signed)
Spoke with pt her INR his 2.3  She is aware of results

## 2012-02-11 ENCOUNTER — Encounter (HOSPITAL_COMMUNITY)
Admission: RE | Disposition: A | Discharge status: Home / Self Care | Payer: Self-pay | Source: Ambulatory Visit | Attending: Cardiology

## 2012-02-11 ENCOUNTER — Ambulatory Visit (HOSPITAL_COMMUNITY)
Admission: RE | Admit: 2012-02-11 | Discharge: 2012-02-11 | Disposition: A | Discharge status: Home / Self Care | Payer: Medicare Other | Source: Ambulatory Visit | Attending: Cardiology | Admitting: Cardiology

## 2012-02-11 ENCOUNTER — Encounter (HOSPITAL_COMMUNITY): Payer: Self-pay

## 2012-02-11 DIAGNOSIS — I4891 Unspecified atrial fibrillation: Secondary | ICD-10-CM

## 2012-02-11 HISTORY — DX: Sleep apnea, unspecified: G47.30

## 2012-02-11 HISTORY — PX: TEE WITHOUT CARDIOVERSION: SHX5443

## 2012-02-11 SURGERY — ECHOCARDIOGRAM, TRANSESOPHAGEAL
Anesthesia: Moderate Sedation

## 2012-02-11 MED ORDER — SODIUM CHLORIDE 0.9 % IJ SOLN: 3.0000 mL | INTRAMUSCULAR | Status: DC | PRN

## 2012-02-11 MED ORDER — MIDAZOLAM HCL 10 MG/2ML IJ SOLN
INTRAMUSCULAR | Status: AC
  Filled 2012-02-11: qty 4

## 2012-02-11 MED ORDER — SODIUM CHLORIDE 0.9 % IV SOLN: INTRAVENOUS | Status: DC

## 2012-02-11 MED ORDER — MIDAZOLAM HCL 10 MG/2ML IJ SOLN
INTRAMUSCULAR | Status: DC | PRN
  Administered 2012-02-11 (×2): 2 mg via INTRAVENOUS

## 2012-02-11 MED ORDER — FENTANYL CITRATE 0.05 MG/ML IJ SOLN: 250.0000 ug | Freq: Once | INTRAMUSCULAR | Status: DC

## 2012-02-11 MED ORDER — SODIUM CHLORIDE 0.9 % IJ SOLN: 3.0000 mL | Freq: Two times a day (BID) | INTRAMUSCULAR | Status: DC

## 2012-02-11 MED ORDER — FENTANYL CITRATE 0.05 MG/ML IJ SOLN
INTRAMUSCULAR | Status: DC | PRN
  Administered 2012-02-11 (×2): 25 ug via INTRAVENOUS

## 2012-02-11 MED ORDER — BENZOCAINE 20 % MT SOLN: 1.0000 "application " | OROMUCOSAL | Status: DC | PRN

## 2012-02-11 MED ORDER — FENTANYL CITRATE 0.05 MG/ML IJ SOLN
INTRAMUSCULAR | Status: AC
  Filled 2012-02-11: qty 4

## 2012-02-11 MED ORDER — MIDAZOLAM HCL 10 MG/2ML IJ SOLN: 10.0000 mg | Freq: Once | INTRAMUSCULAR | Status: DC

## 2012-02-11 MED ORDER — BUTAMBEN-TETRACAINE-BENZOCAINE 2-2-14 % EX AERO
INHALATION_SPRAY | CUTANEOUS | Status: DC | PRN
  Administered 2012-02-11: 2 via TOPICAL
  Administered 2012-02-11: 1 via TOPICAL

## 2012-02-11 MED ORDER — SODIUM CHLORIDE 0.45 % IV SOLN
INTRAVENOUS | Status: DC
  Administered 2012-02-11: 500 mL via INTRAVENOUS

## 2012-02-11 NOTE — H&P (View-Only) (Signed)
Primary Care Physician: MCNEILL,[name], MD, MD Referring Physician:  Dr Klein   Julie Gill is a 71 y.o. female with a h/o paroxysmal atrial fibrillation who presents today for EP follow-up.  She reports initially being diagnosed with afib 11/12 after presenting with tachypalpitations.  In retrospect, she feels that she may have had afib 5-6 months prior.  She describes increasing frequency and duration of afib since that time.  She was initiated on flecainide but did not tolerate this medication due to headaches.  She was most recently placed on tikosyn 375mg BID.  She has had at least one symptomatic episode of afib since initiation of this medication, lasting several hours.  She is unaware of triggers or precipitants for afib, though she thinks stress may contribute.  Episodes typically occur at night.  She reports having a sleep study 4-5 years ago which revealed "mild sleep apnea" and for which CPAP was not advised (per pt).  She feels that she is tolerating tikosyn well.  She is on coumadin for stroke prevention. Today, she denies symptoms of chest pain, shortness of breath, orthopnea, PND, lower extremity edema, dizziness, presyncope, syncope, or neurologic sequela. The patient is tolerating medications without difficulties and is otherwise without complaint today.   Past Medical History  Diagnosis Date  . GERD (gastroesophageal reflux disease)   . Hyperlipidemia   . Hypertension   . Atrial fibrillation     CHADS VASC score 3  . Mild sleep apnea   . Sinus bradycardia   . Restless leg syndrome   . Anxiety   . Arthritis   . Obesity    Past Surgical History  Procedure Date  . Abdominal hysterectomy   . Tonsillectomy and adenoidectomy   . Bladder suspension   . Colonoscopy w/ polypectomy 1995, 200,2004, 2007, 05/06/2011    adenomas  . Appendectomy   . Varicose vein surgery     Current Outpatient Prescriptions  Medication Sig Dispense Refill  . aspirin 325 MG tablet Take 325 mg  by mouth daily.      . calcium carbonate (OS-CAL) 600 MG TABS Take 300-600 mg by mouth See admin instructions. Takes 1 tablet in the morning and 1/2 tablet at night      . Cholecalciferol (VITAMIN D PO) Take 4,000 Units by mouth daily.        . clonazePAM (KLONOPIN) 0.5 MG tablet Take 0.25 mg by mouth 2 (two) times daily as needed. For stress      . dofetilide (TIKOSYN) 125 MCG capsule Take 3 capsules (375 mcg total) by mouth every 12 (twelve) hours.  180 capsule  6  . fish oil-omega-3 fatty acids 1000 MG capsule Take two capsules by mouth twice daily      . lisinopril (PRINIVIL,ZESTRIL) 20 MG tablet Take 1 tablet (20 mg total) by mouth daily.  30 tablet  6  . Magnesium 200 MG TABS Take 400 mg by mouth daily.       . Multiple Vitamins-Minerals (MULTIVITAMIN WITH MINERALS) tablet Take 1 tablet by mouth daily.        . pindolol (VISKEN) 5 MG tablet Take 5 mg by mouth 2 (two) times daily.      . polyethylene glycol (MIRALAX / GLYCOLAX) packet Take 17 g by mouth daily.      . potassium chloride SA (K-DUR,KLOR-CON) 20 MEQ tablet Take 20 mEq by mouth daily.      . pravastatin (PRAVACHOL) 40 MG tablet Take 80 mg by mouth daily.       .   ranitidine (ZANTAC) 150 MG capsule Take 150 mg by mouth daily.      . vitamin E (VITAMIN E) 400 UNIT capsule Take 800 Units by mouth daily.      . warfarin (COUMADIN) 5 MG tablet Take 5-7.5 mg by mouth See admin instructions. Takes 1 tablet (5mg) on Sunday, and 1.5 tablets (7.5mg) Monday thru Saturday        Allergies  Allergen Reactions  . Codeine Other (See Comments)    Skin crawls  . Prednisone Other (See Comments)    tachycardia    History   Social History  . Marital Status: Married    Spouse Name: N/A    Number of Children: N/A  . Years of Education: N/A   Occupational History  . Not on file.   Social History Main Topics  . Smoking status: Former Smoker    Quit date: 12/01/1984  . Smokeless tobacco: Never Used  . Alcohol Use: No  . Drug Use: No    . Sexually Active: Not Currently    Birth Control/ Protection: Post-menopausal   Other Topics Concern  . Not on file   Social History Narrative   Pt lives in [place] with spouse.  2 grown children.Retired engraver.    Family History  Problem Relation Age of Onset  . Colon cancer Maternal Uncle   . Colon cancer Maternal Grandmother   . Diabetes Brother   . Heart failure Mother   . Lung cancer Father     ROS- All systems are reviewed and negative except as per the HPI above  Physical Exam: Filed Vitals:   01/19/12 1502  BP: 118/68  Pulse: 53  Weight: 192 lb 1.9 oz (87.145 kg)    GEN- The patient is overweight appearing, alert and oriented x 3 today.   Head- normocephalic, atraumatic Eyes-  Sclera clear, conjunctiva pink Ears- hearing intact Oropharynx- clear Neck- supple, no JVP Lymph- no cervical lymphadenopathy Lungs- Clear to ausculation bilaterally, normal work of breathing Heart- Regular rate and rhythm, no murmurs, rubs or gallops, PMI not laterally displaced GI- soft, NT, ND, + BS Extremities- no clubbing, cyanosis, or edema MS- no significant deformity or atrophy Skin- no rash or lesion Psych- euthymic mood, full affect Neuro- strength and sensation are intact  EKG today reveals sinus bradycardia 53 bpm, PR 142, QRS 84, Qtc 461 Echo 09/22/11- LVEF 60-65%, mild LVH, mild MR, LA size 41mm  Assessment and Plan:  

## 2012-02-11 NOTE — Interval H&P Note (Signed)
History and Physical Interval Note:  02/11/2012 10:55 AM  Julie Gill  has presented today for surgery, with the diagnosis of a fib  The various methods of treatment have been discussed with the patient and family. After consideration of risks, benefits and other options for treatment, the patient has consented to  Procedure(s) (LRB): TRANSESOPHAGEAL ECHOCARDIOGRAM (TEE) (N/A) as a surgical intervention .  The patients' history has been reviewed, patient examined, no change in status, stable for surgery.  I have reviewed the patients' chart and labs.  Questions were answered to the patient's satisfaction.     Tyrihanna Wingert [employer]

## 2012-02-11 NOTE — Progress Notes (Signed)
*  PRELIMINARY RESULTS* Echocardiogram 2D Echocardiogram has been performed.  Julie Gill 02/11/2012, 12:22 PM

## 2012-02-11 NOTE — Procedures (Signed)
Procedure: TEE  Indication: Atrial fibrillation  Sedation: Versed 4 mg IV, Fentanyl 50 mcg IV  Findings: See echo section for full report.  In brief, normal LV size and systolic function, EF 60%.  No left atrial appendage thrombus. Moderate LAE.  Normal RV.  Mild MR.   OK for atrial fibrillation ablation tomorrow.   Julie Gill 02/11/2012 11:16 AM

## 2012-02-11 NOTE — Discharge Instructions (Signed)
Endoscopy °Care After °Please read the instructions outlined below and refer to this sheet in the next few weeks. These discharge instructions provide you with general information on caring for yourself after you leave the hospital. Your doctor may also give you specific instructions. While your treatment has been planned according to the most current medical practices available, unavoidable complications occasionally occur. If you have any problems or questions after discharge, please call your doctor. °HOME CARE INSTRUCTIONS °Activity °· You may resume your regular activity but move at a slower pace for the next 24 hours.  °· Take frequent rest periods for the next 24 hours.  °· Walking will help expel (get rid of) the air and reduce the bloated feeling in your abdomen.  °· No driving for 24 hours (because of the anesthesia (medicine) used during the test).  °· You may shower.  °· Do not sign any important legal documents or operate any machinery for 24 hours (because of the anesthesia used during the test).  °Nutrition °· Drink plenty of fluids.  °· You may resume your normal diet.  °· Begin with a light meal and progress to your normal diet.  °· Avoid alcoholic beverages for 24 hours or as instructed by your caregiver.  °Medications °You may resume your normal medications unless your caregiver tells you otherwise. °What you can expect today °· You may experience abdominal discomfort such as a feeling of fullness or "gas" pains.  °· You may experience a sore throat for 2 to 3 days. This is normal. Gargling with salt water may help this.  °Follow-up °Your doctor will discuss the results of your test with you. °SEEK IMMEDIATE MEDICAL CARE IF: °· You have excessive nausea (feeling sick to your stomach) and/or vomiting.  °· You have severe abdominal pain and distention (swelling).  °· You have trouble swallowing.  °· You have a temperature over 100° F (37.8° C).  °· You have rectal bleeding or vomiting of blood.    °Document Released: 07/01/2004 Document Revised: 07/30/2011 Document Reviewed: 01/12/2008 °Exit[employer]® Patient Information ©2012 [employer]. °

## 2012-02-12 ENCOUNTER — Encounter (HOSPITAL_COMMUNITY)
Admission: RE | Disposition: A | Discharge status: Home / Self Care | Payer: Self-pay | Source: Ambulatory Visit | Attending: Internal Medicine

## 2012-02-12 ENCOUNTER — Ambulatory Visit (HOSPITAL_COMMUNITY): Payer: Medicare Other | Admitting: Anesthesiology

## 2012-02-12 ENCOUNTER — Encounter (HOSPITAL_COMMUNITY): Payer: Self-pay | Admitting: Anesthesiology

## 2012-02-12 ENCOUNTER — Encounter (HOSPITAL_COMMUNITY): Payer: Self-pay | Admitting: Cardiology

## 2012-02-12 ENCOUNTER — Encounter (HOSPITAL_COMMUNITY): Payer: Self-pay | Admitting: *Deleted

## 2012-02-12 ENCOUNTER — Ambulatory Visit (HOSPITAL_COMMUNITY)
Admission: RE | Admit: 2012-02-12 | Discharge: 2012-02-13 | Disposition: A | Discharge status: Home / Self Care | Payer: Medicare Other | Source: Ambulatory Visit | Attending: Internal Medicine | Admitting: Internal Medicine

## 2012-02-12 DIAGNOSIS — E7800 Pure hypercholesterolemia, unspecified: Secondary | ICD-10-CM

## 2012-02-12 DIAGNOSIS — I10 Essential (primary) hypertension: Secondary | ICD-10-CM | POA: Insufficient documentation

## 2012-02-12 DIAGNOSIS — G2581 Restless legs syndrome: Secondary | ICD-10-CM | POA: Insufficient documentation

## 2012-02-12 DIAGNOSIS — E785 Hyperlipidemia, unspecified: Secondary | ICD-10-CM | POA: Insufficient documentation

## 2012-02-12 DIAGNOSIS — K219 Gastro-esophageal reflux disease without esophagitis: Secondary | ICD-10-CM | POA: Insufficient documentation

## 2012-02-12 DIAGNOSIS — I4891 Unspecified atrial fibrillation: Secondary | ICD-10-CM | POA: Insufficient documentation

## 2012-02-12 DIAGNOSIS — I480 Paroxysmal atrial fibrillation: Secondary | ICD-10-CM | POA: Insufficient documentation

## 2012-02-12 DIAGNOSIS — R001 Bradycardia, unspecified: Secondary | ICD-10-CM | POA: Insufficient documentation

## 2012-02-12 DIAGNOSIS — E669 Obesity, unspecified: Secondary | ICD-10-CM | POA: Insufficient documentation

## 2012-02-12 DIAGNOSIS — F411 Generalized anxiety disorder: Secondary | ICD-10-CM | POA: Insufficient documentation

## 2012-02-12 DIAGNOSIS — G4730 Sleep apnea, unspecified: Secondary | ICD-10-CM | POA: Insufficient documentation

## 2012-02-12 HISTORY — DX: Myoneural disorder, unspecified: G70.9

## 2012-02-12 HISTORY — PX: ATRIAL FIBRILLATION ABLATION: SHX5456

## 2012-02-12 HISTORY — PX: OTHER SURGICAL HISTORY: SHX169

## 2012-02-12 LAB — POCT ACTIVATED CLOTTING TIME
Activated Clotting Time: 265 seconds
Activated Clotting Time: 303 seconds
Activated Clotting Time: 303 seconds
Activated Clotting Time: 171 seconds
Activated Clotting Time: 182 seconds

## 2012-02-12 LAB — MRSA PCR SCREENING: MRSA by PCR: NEGATIVE

## 2012-02-12 LAB — PROTIME-INR
INR: 2.5 — ABNORMAL HIGH (ref 0.00–1.49)
Prothrombin Time: 27.4 seconds — ABNORMAL HIGH (ref 11.6–15.2)

## 2012-02-12 SURGERY — ATRIAL FIBRILLATION ABLATION
Anesthesia: General

## 2012-02-12 MED ORDER — HEPARIN SODIUM (PORCINE) 1000 UNIT/ML IJ SOLN
INTRAMUSCULAR | Status: AC
  Filled 2012-02-12: qty 1

## 2012-02-12 MED ORDER — SODIUM CHLORIDE 0.9 % IV SOLN
2000.0000 ug | INTRAVENOUS | Status: DC | PRN
  Administered 2012-02-12: 20 ug/min via INTRAVENOUS

## 2012-02-12 MED ORDER — HEPARIN SODIUM (PORCINE) 1000 UNIT/ML IJ SOLN
INTRAMUSCULAR | Status: DC | PRN
  Administered 2012-02-12: 4000 [IU] via INTRAVENOUS
  Administered 2012-02-12: 10000 [IU] via INTRAVENOUS
  Administered 2012-02-12: 3000 [IU] via INTRAVENOUS

## 2012-02-12 MED ORDER — FENTANYL CITRATE 0.05 MG/ML IJ SOLN
INTRAMUSCULAR | Status: DC | PRN
  Administered 2012-02-12 (×2): 50 ug via INTRAVENOUS
  Administered 2012-02-12: 25 ug via INTRAVENOUS
  Administered 2012-02-12: 50 ug via INTRAVENOUS

## 2012-02-12 MED ORDER — SODIUM CHLORIDE 0.9 % IV SOLN: 250.0000 mL | INTRAVENOUS | Status: DC | PRN

## 2012-02-12 MED ORDER — POTASSIUM CHLORIDE CRYS ER 20 MEQ PO TBCR: 20.0000 meq | EXTENDED_RELEASE_TABLET | Freq: Every day | ORAL | Status: DC

## 2012-02-12 MED ORDER — CLONAZEPAM 0.5 MG PO TABS: 0.2500 mg | ORAL_TABLET | Freq: Two times a day (BID) | ORAL | Status: DC | PRN

## 2012-02-12 MED ORDER — BUPIVACAINE HCL (PF) 0.25 % IJ SOLN
INTRAMUSCULAR | Status: AC
  Filled 2012-02-12: qty 30

## 2012-02-12 MED ORDER — WARFARIN SODIUM 7.5 MG PO TABS
7.5000 mg | ORAL_TABLET | ORAL | Status: AC
  Administered 2012-02-12: 7.5 mg via ORAL
  Filled 2012-02-12: qty 1

## 2012-02-12 MED ORDER — CALCIUM CARBONATE 1250 (500 CA) MG PO TABS
1.0000 | ORAL_TABLET | Freq: Two times a day (BID) | ORAL | Status: DC
  Administered 2012-02-12 – 2012-02-13 (×2): 500 mg via ORAL
  Filled 2012-02-12 (×3): qty 1

## 2012-02-12 MED ORDER — WARFARIN - PHYSICIAN DOSING INPATIENT: Freq: Every day | Status: DC

## 2012-02-12 MED ORDER — CALCIUM CARBONATE 600 MG PO TABS: 300.0000 mg | ORAL_TABLET | Freq: Two times a day (BID) | ORAL | Status: DC

## 2012-02-12 MED ORDER — HYDROMORPHONE HCL PF 1 MG/ML IJ SOLN: 0.2500 mg | INTRAMUSCULAR | Status: DC | PRN

## 2012-02-12 MED ORDER — SODIUM CHLORIDE 0.9 % IJ SOLN: 3.0000 mL | INTRAMUSCULAR | Status: DC | PRN

## 2012-02-12 MED ORDER — SODIUM CHLORIDE 0.9 % IJ SOLN
3.0000 mL | Freq: Two times a day (BID) | INTRAMUSCULAR | Status: DC
  Administered 2012-02-12: 3 mL via INTRAVENOUS

## 2012-02-12 MED ORDER — ONDANSETRON HCL 4 MG/2ML IJ SOLN: 4.0000 mg | Freq: Four times a day (QID) | INTRAMUSCULAR | Status: DC | PRN

## 2012-02-12 MED ORDER — WARFARIN SODIUM 5 MG PO TABS
5.0000 mg | ORAL_TABLET | ORAL | Status: DC
Start: 2012-02-15 — End: 2012-02-13

## 2012-02-12 MED ORDER — LISINOPRIL 40 MG PO TABS
40.0000 mg | ORAL_TABLET | Freq: Every day | ORAL | Status: DC
  Administered 2012-02-13: 40 mg via ORAL
  Filled 2012-02-12: qty 1

## 2012-02-12 MED ORDER — LACTATED RINGERS IV SOLN
INTRAVENOUS | Status: DC | PRN
  Administered 2012-02-12: 07:00:00 via INTRAVENOUS

## 2012-02-12 MED ORDER — ONDANSETRON HCL 4 MG/2ML IJ SOLN: 4.0000 mg | Freq: Once | INTRAMUSCULAR | Status: DC | PRN

## 2012-02-12 MED ORDER — DOFETILIDE 250 MCG PO CAPS
375.0000 ug | ORAL_CAPSULE | Freq: Two times a day (BID) | ORAL | Status: DC
  Administered 2012-02-12 – 2012-02-13 (×2): 375 ug via ORAL
  Filled 2012-02-12 (×3): qty 1

## 2012-02-12 MED ORDER — PROPOFOL 10 MG/ML IV BOLUS
INTRAVENOUS | Status: DC | PRN
  Administered 2012-02-12: 150 mg via INTRAVENOUS

## 2012-02-12 MED ORDER — PROTAMINE SULFATE 10 MG/ML IV SOLN
INTRAVENOUS | Status: DC | PRN
  Administered 2012-02-12: 10 mg via INTRAVENOUS
  Administered 2012-02-12: 30 mg via INTRAVENOUS

## 2012-02-12 MED ORDER — MIDAZOLAM HCL 5 MG/5ML IJ SOLN
INTRAMUSCULAR | Status: DC | PRN
  Administered 2012-02-12: 2 mg via INTRAVENOUS

## 2012-02-12 MED ORDER — ACETAMINOPHEN 325 MG PO TABS
650.0000 mg | ORAL_TABLET | ORAL | Status: DC | PRN
  Administered 2012-02-12 – 2012-02-13 (×2): 650 mg via ORAL
  Filled 2012-02-12 (×2): qty 2

## 2012-02-12 MED ORDER — DEXTROSE 5 % IV SOLN
INTRAVENOUS | Status: AC
  Filled 2012-02-12: qty 250

## 2012-02-12 MED ORDER — HYDROXYUREA 500 MG PO CAPS
ORAL_CAPSULE | ORAL | Status: AC
  Filled 2012-02-12: qty 1

## 2012-02-12 MED ORDER — LIDOCAINE HCL 1 % IJ SOLN
INTRAMUSCULAR | Status: DC | PRN
  Administered 2012-02-12: 60 mg via INTRADERMAL

## 2012-02-12 MED ORDER — POTASSIUM CHLORIDE CRYS ER 20 MEQ PO TBCR
20.0000 meq | EXTENDED_RELEASE_TABLET | Freq: Every day | ORAL | Status: DC
  Administered 2012-02-13: 20 meq via ORAL
  Filled 2012-02-12: qty 1

## 2012-02-12 NOTE — Anesthesia Procedure Notes (Signed)
Procedure Name: LMA Insertion Date/Time: 02/12/2012 8:21 AM Performed by: Marena Chancy Pre-anesthesia Checklist: Patient identified, Emergency Drugs available, Suction available and Patient being monitored Patient Re-evaluated:Patient Re-evaluated prior to inductionOxygen Delivery Method: Circle system utilized Preoxygenation: Pre-oxygenation with 100% oxygen Intubation Type: IV induction LMA: LMA inserted LMA Size: 4.0 Number of attempts: 1 Placement Confirmation: positive ETCO2 and breath sounds checked- equal and bilateral Tube secured with: Tape Dental Injury: Teeth and Oropharynx as per pre-operative assessment

## 2012-02-12 NOTE — Transfer of Care (Signed)
Immediate Anesthesia Transfer of Care Note  Patient: Julie Gill  Procedure(s) Performed: Procedure(s) (LRB): ATRIAL FIBRILLATION ABLATION (N/A)  Patient Location: Cath Lab  Anesthesia Type: General  Level of Consciousness: awake, alert  and oriented  Airway & Oxygen Therapy: Patient Spontanous Breathing and Patient connected to nasal cannula oxygen  Post-op Assessment: Report given to PACU RN, Post -op Vital signs reviewed and stable and Patient moving all extremities X 4  Post vital signs: Reviewed and stable  Complications: No apparent anesthesia complications

## 2012-02-12 NOTE — Anesthesia Preprocedure Evaluation (Addendum)
Anesthesia Evaluation  Patient identified by MRN, date of birth, ID band Patient awake    Reviewed: Allergy & Precautions, H&P , NPO status , Patient's Chart, lab work & pertinent test results  Airway Mallampati: II      Dental  (+) Teeth Intact   Pulmonary shortness of breath and with exertion, sleep apnea ,  breath sounds clear to auscultation        Cardiovascular hypertension, Pt. on medications + dysrhythmias Atrial Fibrillation Rhythm:Regular Rate:Normal     Neuro/Psych  Headaches, Anxiety    GI/Hepatic GERD-  Controlled and Medicated,  Endo/Other    Renal/GU      Musculoskeletal   Abdominal (+) + obese,  Abdomen: soft.    Peds  Hematology   Anesthesia Other Findings   Reproductive/Obstetrics                          Anesthesia Physical Anesthesia Plan  ASA: III  Anesthesia Plan: General   Post-op Pain Management:    Induction: Intravenous  Airway Management Planned: LMA  Additional Equipment:   Intra-op Plan:   Post-operative Plan: Extubation in OR  Informed Consent: I have reviewed the patients History and Physical, chart, labs and discussed the procedure including the risks, benefits and alternatives for the proposed anesthesia with the patient or authorized representative who has indicated his/her understanding and acceptance.   Dental advisory given  Plan Discussed with: CRNA and Surgeon  Anesthesia Plan Comments: (H/O Afib now in SR Htn GERD Obesity  Plan GA  Kipp Brood, MD)      Anesthesia Quick Evaluation

## 2012-02-12 NOTE — H&P (View-Only) (Signed)
Primary Care Physician: MCNEILL,[name], MD, MD Referring Physician:  Dr Klein   Julie Gill is a 70 y.o. female with a h/o paroxysmal atrial fibrillation who presents today for EP follow-up.  She reports initially being diagnosed with afib 11/12 after presenting with tachypalpitations.  In retrospect, she feels that she may have had afib 5-6 months prior.  She describes increasing frequency and duration of afib since that time.  She was initiated on flecainide but did not tolerate this medication due to headaches.  She was most recently placed on tikosyn 375mg BID.  She has had at least one symptomatic episode of afib since initiation of this medication, lasting several hours.  She is unaware of triggers or precipitants for afib, though she thinks stress may contribute.  Episodes typically occur at night.  She reports having a sleep study 4-5 years ago which revealed "mild sleep apnea" and for which CPAP was not advised (per pt).  She feels that she is tolerating tikosyn well.  She is on coumadin for stroke prevention. Today, she denies symptoms of chest pain, shortness of breath, orthopnea, PND, lower extremity edema, dizziness, presyncope, syncope, or neurologic sequela. The patient is tolerating medications without difficulties and is otherwise without complaint today.   Past Medical History  Diagnosis Date  . GERD (gastroesophageal reflux disease)   . Hyperlipidemia   . Hypertension   . Atrial fibrillation     CHADS VASC score 3  . Mild sleep apnea   . Sinus bradycardia   . Restless leg syndrome   . Anxiety   . Arthritis   . Obesity    Past Surgical History  Procedure Date  . Abdominal hysterectomy   . Tonsillectomy and adenoidectomy   . Bladder suspension   . Colonoscopy w/ polypectomy 1995, 200,2004, 2007, 05/06/2011    adenomas  . Appendectomy   . Varicose vein surgery     Current Outpatient Prescriptions  Medication Sig Dispense Refill  . aspirin 325 MG tablet Take 325 mg  by mouth daily.      . calcium carbonate (OS-CAL) 600 MG TABS Take 300-600 mg by mouth See admin instructions. Takes 1 tablet in the morning and 1/2 tablet at night      . Cholecalciferol (VITAMIN D PO) Take 4,000 Units by mouth daily.        . clonazePAM (KLONOPIN) 0.5 MG tablet Take 0.25 mg by mouth 2 (two) times daily as needed. For stress      . dofetilide (TIKOSYN) 125 MCG capsule Take 3 capsules (375 mcg total) by mouth every 12 (twelve) hours.  180 capsule  6  . fish oil-omega-3 fatty acids 1000 MG capsule Take two capsules by mouth twice daily      . lisinopril (PRINIVIL,ZESTRIL) 20 MG tablet Take 1 tablet (20 mg total) by mouth daily.  30 tablet  6  . Magnesium 200 MG TABS Take 400 mg by mouth daily.       . Multiple Vitamins-Minerals (MULTIVITAMIN WITH MINERALS) tablet Take 1 tablet by mouth daily.        . pindolol (VISKEN) 5 MG tablet Take 5 mg by mouth 2 (two) times daily.      . polyethylene glycol (MIRALAX / GLYCOLAX) packet Take 17 g by mouth daily.      . potassium chloride SA (K-DUR,KLOR-CON) 20 MEQ tablet Take 20 mEq by mouth daily.      . pravastatin (PRAVACHOL) 40 MG tablet Take 80 mg by mouth daily.       .   ranitidine (ZANTAC) 150 MG capsule Take 150 mg by mouth daily.      . vitamin E (VITAMIN E) 400 UNIT capsule Take 800 Units by mouth daily.      . warfarin (COUMADIN) 5 MG tablet Take 5-7.5 mg by mouth See admin instructions. Takes 1 tablet (5mg) on Sunday, and 1.5 tablets (7.5mg) Monday thru Saturday        Allergies  Allergen Reactions  . Codeine Other (See Comments)    Skin crawls  . Prednisone Other (See Comments)    tachycardia    History   Social History  . Marital Status: Married    Spouse Name: N/A    Number of Children: N/A  . Years of Education: N/A   Occupational History  . Not on file.   Social History Main Topics  . Smoking status: Former Smoker    Quit date: 12/01/1984  . Smokeless tobacco: Never Used  . Alcohol Use: No  . Drug Use: No    . Sexually Active: Not Currently    Birth Control/ Protection: Post-menopausal   Other Topics Concern  . Not on file   Social History Narrative   Pt lives in [place] with spouse.  2 grown children.Retired engraver.    Family History  Problem Relation Age of Onset  . Colon cancer Maternal Uncle   . Colon cancer Maternal Grandmother   . Diabetes Brother   . Heart failure Mother   . Lung cancer Father     ROS- All systems are reviewed and negative except as per the HPI above  Physical Exam: Filed Vitals:   01/19/12 1502  BP: 118/68  Pulse: 53  Weight: 192 lb 1.9 oz (87.145 kg)    GEN- The patient is overweight appearing, alert and oriented x 3 today.   Head- normocephalic, atraumatic Eyes-  Sclera clear, conjunctiva pink Ears- hearing intact Oropharynx- clear Neck- supple, no JVP Lymph- no cervical lymphadenopathy Lungs- Clear to ausculation bilaterally, normal work of breathing Heart- Regular rate and rhythm, no murmurs, rubs or gallops, PMI not laterally displaced GI- soft, NT, ND, + BS Extremities- no clubbing, cyanosis, or edema MS- no significant deformity or atrophy Skin- no rash or lesion Psych- euthymic mood, full affect Neuro- strength and sensation are intact  EKG today reveals sinus bradycardia 53 bpm, PR 142, QRS 84, Qtc 461 Echo 09/22/11- LVEF 60-65%, mild LVH, mild MR, LA size 41mm  Assessment and Plan:  

## 2012-02-12 NOTE — Anesthesia Postprocedure Evaluation (Signed)
  Anesthesia Post-op Note  Patient: Julie Gill  Procedure(s) Performed: Procedure(s) (LRB): ATRIAL FIBRILLATION ABLATION (N/A)  Patient Location: CCU  Anesthesia Type: General  Level of Consciousness: awake, alert  and oriented  Airway and Oxygen Therapy: Patient Spontanous Breathing and Patient connected to nasal cannula oxygen  Post-op Pain: none  Post-op Assessment: Post-op Vital signs reviewed and Patient's Cardiovascular Status Stable  Post-op Vital Signs: stable  Complications: No apparent anesthesia complications

## 2012-02-12 NOTE — Interval H&P Note (Signed)
History and Physical Interval Note:  02/12/2012 7:17 AM  Julie Gill  has presented today for surgery, with the diagnosis of Afib  The various methods of treatment have been discussed with the patient and family. After consideration of risks, benefits and other options for treatment, the patient has consented to  Procedure(s) (LRB): ATRIAL FIBRILLATION ABLATION (N/A) as a surgical intervention .  The patients' history has been reviewed, patient examined, no change in status, stable for surgery.  I have reviewed the patients' chart and labs.  Questions were answered to the patient's satisfaction.     Hillis Range

## 2012-02-12 NOTE — Preoperative (Signed)
Beta Blockers   Reason not to administer Beta Blockers:Not Applicable 

## 2012-02-12 NOTE — Op Note (Signed)
SURGEON:  Hillis Range, MD  PREPROCEDURE DIAGNOSES: 1. Paroxysmal atrial fibrillation.  POSTPROCEDURE DIAGNOSES: 1. Paroxysmal  atrial fibrillation.  PROCEDURES: 1. Comprehensive electrophysiologic study. 2. Coronary sinus pacing and recording. 3. Three-dimensional mapping of supraventricular tachycardia (afib as well as additional right atrial mapping and ablation). 4. Ablation of supraventricular tachycardia  (afib as well as additional right atrial mapping and ablation).. 5. Arterial blood pressure monitoring. 6. Intracardiac echocardiography. 7. Transseptal puncture of an intact septum. 8. Rotational Angiography with processing at an independent workstation 9. Arrhythmia induction with pacing with isuprel infusion  INTRODUCTION:  Julie Gill is a 71 y.o. female with a history of paroxysmal atrial fibrillation who now presents for EP study and radiofrequency ablation.  The patient reports initially being diagnosed with atrial fibrillation in 11/12 after presenting with symptomatic palpitations and fatgiue. The patient reports increasing frequency and duration of atrial fibrillation since that time.  The patient has failed medical therapy with Flecainide and TIkosyn.  The patient therefore presents today for catheter ablation of atrial fibrillation.  DESCRIPTION OF PROCEDURE:  Informed written consent was obtained, and the patient was brought to the electrophysiology lab in a fasting state.  The patient was adequately sedated with intravenous medications as outlined in the anesthesia report.  The patient's left and right groins were prepped and draped in the usual sterile fashion by the EP lab staff.  Using a percutaneous Seldinger technique, two 7-French and one 8-French hemostasis sheaths were placed into the right common femoral vein.  A 4- [place] hemostasis sheath was placed in the right common femoral artery for blood pressure monitoring.  An 11-French hemostasis sheath was placed into  the left common femoral vein.   3 Dimensional Rotational Angiography: A 5 french pigtail catheter was introduced through the right common femoral vein and advanced into the inferior venocava.  3 demential rotational angiography was then performed by power injection of 100cc of nonionic contrast.  Reprocessing at an independent work station was then performed.   This demonstrated a moderately enlarged left atrium with 4 separate pulmonary veins which were also moderate in size.  There were no anomalous veins or significant abnormalities.  A 3 dimensional rendering of the left atrium was then merged using [employer] onto the [employer] system and registered with intracardiac echo (see below).  The pigtail catheter was then removed.  Catheter Placement:  A 7-French Biosense Webster Decapolar coronary sinus catheter was introduced through the right common femoral vein and advanced into the coronary sinus for recording and pacing from this location.  A 6-French quadripolar Josephson catheter was introduced through the right common femoral vein and advanced into the right ventricle for recording and pacing.  This catheter was then pulled back to the His bundle location.    Initial Measurements: The patient presented to the electrophysiology lab in sinus rhythm.  His PR interval measured 187 with a QRS duringation of 102 and a QT interval of 516 msec.  The AH interval measured 102 and the HV interval measured 40 msec.     Intracardiac Echocardiography: A 10-French Biosense Webster AcuNav intracardiac echocardiography catheter was introduced through the left common femoral vein and advanced into the right atrium. Intracardiac echocardiography was performed of the left atrium, and a three-dimensional anatomical rendering of the left atrium was performed using CARTO sound technology.  The patient was noted to have a moderately enlarged left atrium.  The interatrial septum was prominent. All 4  pulmonary veins were visualized and noted to  have separate ostia.  The pulmonary veins were moderate in size.  The left atrial appendage was visualized and did not reveal thrombus.   There was no evidence of pulmonary vein stenosis.   Transseptal Puncture: The middle right common femoral vein sheath was exchanged for an 8.5 [place] SL2 transseptal sheath and transseptal access was achieved in a standard fashion using a [place]brough needle under biplane fluoroscopy with intracardiac echocardiography confirmation of the transseptal puncture.  Once transseptal access had been achieved, heparin was administered intravenously and intra- arterially in order to maintain an ACT of greater than 350 seconds throughout the procedure.   Pulmonary Venography: A 6-French multipurpose angiographic catheter with guidewire was introduced through the transseptal sheath and positioned over the mouth of all 4 pulmonary veins.  Pulmonary venograms were performed by hand injection of nonionic contrast and demonstrated moderate-sized pulmonary veins (approximately 15-20 mm in size).  There was no evidence of pulmonary vein stenosis within any of the pulmonary veins.  The angiographic catheter was then removed.    3D Mapping and Ablation: The His bundle catheter was removed and in its place a 3.5 mm Biosense Webster EZ [employer] ablation catheter was advanced into the right atrium.  The transseptal sheath was pulled back into the IVC over a guidewire.  The ablation catheter was advanced across the transseptal hole using the wire as a guide.  The transseptal sheath was then re-advanced over the guidewire into the left atrium.  A duodecapolar Biosense Webster circular mapping catheter was introduced through the transseptal sheath and positioned over the mouth of all 4 pulmonary veins.  Three-dimensional electroanatomical mapping was performed using CARTO technology.  This demonstrated electrical activity within all four  pulmonary veins at baseline. The patient underwent successful sequential electrical isolation and anatomical encircling of all four pulmonary veins using radiofrequency current with a circular mapping catheter as a guide.   Measurements Following Ablation: Following ablation, Isuprel was infused up to 20 mcg/min with no inducible atrial fibrillation, atrial tachycardia, atrial flutter, or sustained PACs.  Following ablation the AH interval measured 100 msec with an HV interval of 40 msec. Ventricular pacing was performed, which revealed midline decremental VA conduction with a VA Wenckebach cycle length of less than 500 msec.  Rapid atrial pacing was performed, which revealed an AV Wenckebach cycle length of 390 msec.  Electroisolation was then again confirmed in all four pulmonary veins.  Additional Right atrial ablation: The circular mapping catheter was pulled back into the right atrium and 3D mapping was performed at the junction of the superior vena cava and right atrium.  Electrical activity was observed within the SVC.  I therefore elected to perform right atrial ablation in this area.  A series of radiofrequency applications were delivered in a circular fashion around the ostium of the SVC.  Prior to each ablation lesions, pacing was performed from the distal ablation electrode to insure that diaphragmatic stimulation was not observed to avoid phrenic nerve injury.  Diaphragmatic excursion was also observed during ablation.    Intracardiac echocardiography was again performed, which revealed no pericardial effusion.  The procedure was therefore considered completed.  All catheters were removed, and the sheaths were aspirated and flushed.  The patient was transferred to the recovery area for sheath removal per protocol.  A limited bedside transthoracic echocardiogram revealed no pericardial effusion.  There were no early apparent complications.  CONCLUSIONS: 1. Sinus rhythm upon presentation.     2. Rotational Angiography reveals a moderate sized left  atrium with four separate pulmonary veins without evidence of pulmonary vein stenosis. 3. Successful electrical isolation and anatomical encircling of all four pulmonary veins with radiofrequency current.   Additional ablation was performed at the junction of the right atrium and SVC. 4. No inducible arrhythmias following ablation both on and off of Isuprel 5. No early apparent complications.   Yasira Engelson,MD 11:13 AM 02/12/2012

## 2012-02-13 ENCOUNTER — Encounter (HOSPITAL_COMMUNITY): Payer: Self-pay | Admitting: Physician Assistant

## 2012-02-13 DIAGNOSIS — I10 Essential (primary) hypertension: Secondary | ICD-10-CM

## 2012-02-13 DIAGNOSIS — I4891 Unspecified atrial fibrillation: Secondary | ICD-10-CM

## 2012-02-13 LAB — BASIC METABOLIC PANEL
BUN: 14 mg/dL (ref 6–23)
CO2: 29 mEq/L (ref 19–32)
Calcium: 9 mg/dL (ref 8.4–10.5)
Chloride: 107 mEq/L (ref 96–112)
Creatinine, Ser: 0.69 mg/dL (ref 0.50–1.10)
GFR calc Af Amer: >90 mL/min (ref >90)
GFR calc non Af Amer: 87 mL/min — ABNORMAL LOW (ref >90)
Glucose, Bld: 92 mg/dL (ref 70–99)
Potassium: 3.8 mEq/L (ref 3.5–5.1)
Sodium: 143 mEq/L (ref 135–145)

## 2012-02-13 LAB — PROTIME-INR
INR: 2.71 — ABNORMAL HIGH (ref 0.00–1.49)
Prothrombin Time: 29.2 seconds — ABNORMAL HIGH (ref 11.6–15.2)

## 2012-02-13 MED ORDER — PANTOPRAZOLE SODIUM 40 MG PO TBEC: 40.0000 mg | DELAYED_RELEASE_TABLET | Freq: Every day | ORAL | Status: DC

## 2012-02-13 NOTE — Progress Notes (Signed)
Pt to be d/c'd home--husband at bedside--instructions given and explained--IV d/c'd

## 2012-02-13 NOTE — Discharge Summary (Signed)
Discharge Summary   Patient ID: Julie Gill MRN: [id number], DOB/AGE: November 11, 1941 71 y.o. Admit date: 02/12/2012 D/C date:     02/13/2012   Primary Discharge Diagnoses:  1. Atrial fibrillation s/p afib ablation 02/12/12 - initially diagnosed 10/2011 - did not tolerate flecainide, had recurrent AF on flecainide 2. Sinus bradycardia  Secondary Discharge Diagnoses:  1. GERD 2. HTN 3. HL 4. Mild sleep apnea 5. Restless leg syndrome 6. Arthritis 7. Anxiety 8. Obesity  Hospital Course: Julie Gill is a 71 y.o. female with a h/o paroxysmal atrial fibrillation who presents to the office with Dr. Johney Frame for EP follow-up. She reports initially being diagnosed with afib 11/12 after presenting with tachypalpitations but in retrospect, she may have had afib 5-6 months prior. She described increasing frequency and duration of afib since that time. She was initiated on flecainide but did not tolerate this medication due to headaches. She was most recently placed on tikosyn 375mg  BID, but endorsed at least one symptomatic episode of afib since initiation of this medication, lasting several hours. Given failure of medical therapy, Dr. Johney Frame proposed atrial fib ablation and the patient wished to proceed. She had labs drawn 02/05/12 showing an INR of 1.8 and thus subtherapeutic. Her coumadin was adjusted and she was brought in for TEE on 02/11/12 demonstrating EF 60%, no LAA thrombus and thus was felt okay to proceed with ablation. She was brought in for this procedure 02/02/12 and ultimately the following conclusions were noted: CONCLUSIONS:  1. Sinus rhythm upon presentation.  2. Rotational Angiography reveals a moderate sized left atrium with four separate pulmonary veins without evidence of pulmonary vein stenosis.  3. Successful electrical isolation and anatomical encircling of all four pulmonary veins with radiofrequency current.  Additional ablation was performed at the junction of the right atrium  and SVC.  4. No inducible arrhythmias following ablation both on and off of Isuprel  5. No early apparent complications. Post-procedurally she did well without complication. INR is 2.71 today. The patient was seen and examined today and felt stable for discharge by Dr. Johney Frame. He would like to continue her home meds including Tikosyn, and start Protonix x 6 weeks.  Discharge Vitals: Blood pressure 132/55, pulse 66, temperature 97.8 F (36.6 C), temperature source Oral, resp. rate 11, height 5' 2.5" (1.588 m), weight 191 lb 9.3 oz (86.9 kg), SpO2 95.00%.  Labs: Lab Results  Component Value Date   WBC 5.6 02/05/2012   HGB 11.9* 02/05/2012   HCT 36.4 02/05/2012   MCV 86.3 02/05/2012   PLT 240.0 02/05/2012     Lab 02/13/12 0500  NA 143  K 3.8  CL 107  CO2 29  BUN 14  CREATININE 0.69  CALCIUM 9.0  PROT --  BILITOT --  ALKPHOS --  ALT --  AST --  GLUCOSE 92  She will get AM dose of KCl prior to dc.  Diagnostic Studies/Procedures   1. TEE 02/11/12 Study Conclusions - Left ventricle: The cavity size was normal. Wall thickness was normal. Systolic function was normal. The estimated ejection fraction was in the range of 55% to 60%. Wall motion was normal; there were no regional wall motion abnormalities. - Aortic valve: There was no stenosis. - Aorta: Normal caliber with mild plaque in descending thoracic aorta. - Mitral valve: Trivial regurgitation. - Left atrium: The atrium was mildly to moderately dilated. No evidence of thrombus in the atrial cavity or appendage. - Right ventricle: The cavity size was normal. Systolic function  was normal. - Right atrium: No evidence of thrombus in the atrial cavity or appendage. - Atrial septum: No defect or patent foramen ovale was identified. Echo contrast study showed no right-to-left atrial level shunt, at baseline or with provocation. Impressions: - No LA thrombus, may proceed with atrial fibrillation ablation.   2. Atrial fib ablation  02/12/12  Discharge Medications   Medication List  As of 02/13/2012  9:05 AM   STOP taking these medications         ranitidine 150 MG capsule         TAKE these medications         calcium carbonate 600 MG Tabs   Commonly known as: OS-CAL   Take 300-600 mg by mouth 2 (two) times daily. Takes 1 tablet in the morning and 1/2 tablet at night      clonazePAM 0.5 MG tablet   Commonly known as: KLONOPIN   Take 0.25 mg by mouth 2 (two) times daily as needed. For anxiety      dofetilide 125 MCG capsule   Commonly known as: TIKOSYN   Take 375 mcg by mouth 2 (two) times daily.      fish oil-omega-3 fatty acids 1000 MG capsule   Take 2 g by mouth 2 (two) times daily.      lisinopril 40 MG tablet   Commonly known as: PRINIVIL,ZESTRIL   Take 1 tablet (40 mg total) by mouth daily.      Magnesium 200 MG Tabs   Take 200 mg by mouth daily.      multivitamin with minerals tablet   Take 1 tablet by mouth daily.      pantoprazole 40 MG tablet   Commonly known as: PROTONIX   Take 1 tablet (40 mg total) by mouth daily.      polyethylene glycol packet   Commonly known as: MIRALAX / GLYCOLAX   Take 17 g by mouth daily.      potassium chloride SA 20 MEQ tablet   Commonly known as: K-DUR,KLOR-CON   Take 20 mEq by mouth daily.      pravastatin 40 MG tablet   Commonly known as: PRAVACHOL   Take 80 mg by mouth daily.      VITAMIN D PO   Take 4,000 Units by mouth daily.      vitamin E 400 UNIT capsule   Generic drug: vitamin E   Take 800 Units by mouth daily.      warfarin 5 MG tablet   Commonly known as: COUMADIN   Take 5-7.5 mg by mouth See admin instructions. Takes 1 tablet (5mg ) on Sunday, and 1.5 tablets (7.5mg ) Monday-Saturday            Disposition   The patient will be discharged in stable condition to home. Discharge Orders    Future Appointments: Provider: Department: Dept Phone: Center:   03/29/2012 2:30 PM Hillis Range, MD [facility] [phone]  [facility]     Future Orders Please Complete By Expires   Diet - low sodium heart healthy      Increase activity slowly      Comments:   No driving for 1 day. No lifting over 5 lbs for 4 days. No sexual activity for 4 days. Keep procedure site clean & dry. If you notice increased pain, swelling, bleeding or pus, call/return!  You may shower, but no soaking baths/hot tubs/pools for 1 week.        Follow-up Information    Follow  up with Primary Care Doctor. (When you are discharged, please call your primary doctor to schedule an appointment to get your Coumadin level (INR) checked next week (Thursday or Friday is okay))       Follow up with Hillis Range, MD. (03/29/12 at 2:30pm)    Contact information:   [address] [place] [address] [phone]          The patient prefers to contact her PCP to schedule INR check because her insurance covers it.  Duration of Discharge Encounter: 40 minutes including physician and PA time.  Signed, Dayna Dunn PA-C 02/13/2012, 9:05 AM   I have seen, examined the patient, and reviewed the above assessment and plan.   Co Sign: Hillis Range, MD

## 2012-02-13 NOTE — Progress Notes (Signed)
SUBJECTIVE: The patient is doing well today.  At this time, she denies chest pain, shortness of breath, or any new concerns.     . calcium carbonate  1 tablet Oral BID  . dextrose      . dofetilide  375 mcg Oral BID  . hydroxyurea      . lisinopril  40 mg Oral Daily  . potassium chloride SA  20 mEq Oral Daily  . sodium chloride  3 mL Intravenous Q12H  . warfarin  5 mg Oral Custom  . warfarin  7.5 mg Oral Custom  . Warfarin - Physician Dosing Inpatient   Does not apply q1800  . DISCONTD: calcium carbonate  600 mg Oral BID  . DISCONTD: potassium chloride SA  20 mEq Oral Daily      . DISCONTD: sodium chloride      OBJECTIVE: Physical Exam: Filed Vitals:   02/12/12 2340 02/13/12 0007 02/13/12 0407 02/13/12 0415  BP: 94/35   130/52  Pulse:      Temp:  98.2 F (36.8 C) 98.2 F (36.8 C)   TempSrc:  Oral Oral   Resp: 16   13  Height:      Weight:      SpO2:  96% 96%     Intake/Output Summary (Last 24 hours) at 02/13/12 0744 Last data filed at 02/13/12 0400  Gross per 24 hour  Intake   1160 ml  Output   1650 ml  Net   -490 ml    Telemetry reveals sinus rhythm  GEN- The patient is well appearing, alert and oriented x 3 today.   Head- normocephalic, atraumatic Eyes-  Sclera clear, conjunctiva pink Ears- hearing intact Oropharynx- clear Neck- supple, no JVP Lymph- no cervical lymphadenopathy Lungs- Clear to ausculation bilaterally, normal work of breathing Heart- Regular rate and rhythm, no murmurs, rubs or gallops, PMI not laterally displaced GI- soft, NT, ND, + BS Extremities- no clubbing, cyanosis, or edema, no hematoma or bruits Skin- no rash or lesion Psych- euthymic mood, full affect Neuro- strength and sensation are intact  LABS: Basic Metabolic Panel:  Basename 02/13/12 0500  NA 143  K 3.8  CL 107  CO2 29  GLUCOSE 92  BUN 14  CREATININE 0.69  CALCIUM 9.0  MG --  PHOS --   ASSESSMENT AND PLAN:  Active Problems:  Sinus bradycardia  Atrial  fibrillation  1. afib- doing well s/p afib ablation Resume home medications protonix 40mg  daily x 6 weeks  Follow-up with me in 12 weeks. DC to home today   Hillis Range, MD 02/13/2012 7:44 AM

## 2012-03-23 ENCOUNTER — Other Ambulatory Visit: Payer: Self-pay

## 2012-03-23 MED ORDER — PANTOPRAZOLE SODIUM 40 MG PO TBEC: 40.0000 mg | DELAYED_RELEASE_TABLET | Freq: Every day | ORAL | Status: DC

## 2012-03-23 NOTE — Telephone Encounter (Signed)
.   Requested Prescriptions   Signed Prescriptions Disp Refills  . pantoprazole (PROTONIX) 40 MG tablet 30 tablet 6    Sig: Take 1 tablet (40 mg total) by mouth daily.    Authorizing Provider: Hillis Range    Ordering User: Lacie Scotts

## 2012-03-29 ENCOUNTER — Encounter: Payer: Self-pay | Admitting: Internal Medicine

## 2012-03-29 ENCOUNTER — Ambulatory Visit (INDEPENDENT_AMBULATORY_CARE_PROVIDER_SITE_OTHER): Payer: Medicare Other | Admitting: Internal Medicine

## 2012-03-29 VITALS — BP 137/65 | HR 62 | Resp 18 | Wt 189.4 lb

## 2012-03-29 DIAGNOSIS — I4891 Unspecified atrial fibrillation: Secondary | ICD-10-CM

## 2012-03-29 NOTE — Assessment & Plan Note (Signed)
Doing well s/p ablation Rare ERAF Continue current medicine Return in 2 months.  IF she is maintaining sinus rhythm at that time we will entertain the idea of stopping tikosyn.  No changes today

## 2012-03-29 NOTE — Patient Instructions (Signed)
Your physician recommends that you schedule a follow-up appointment in: 2 months with Dr Allred  

## 2012-03-29 NOTE — Progress Notes (Signed)
PCP:  MCNEILL,[name], MD, MD Primary EP :  Dr Graciela Husbands  Julie Gill presents today for routine electrophysiology followup.  Since her recent afib ablation, Julie Gill reports doing very well.  Julie Gill has healed without event.  Julie Gill is maintaining sinus rhythm.  Julie Gill has occasional dypsnea but feels that this is stable.  Today, Julie Gill denies symptoms of palpitations, chest pain, lower extremity edema, dizziness, presyncope, or syncope.  Julie Gill feels that Julie Gill is tolerating medications without difficulties and is otherwise without complaint today.   Past Medical History  Diagnosis Date  . GERD (gastroesophageal reflux disease)   . Hyperlipidemia   . Hypertension   . Atrial fibrillation     CHADS VASC score 3. Intolerant to flecainide. s/p afib ablation 02/12/12  . Mild sleep apnea   . Sinus bradycardia   . Restless leg syndrome   . Anxiety   . Arthritis   . Obesity   . Sleep apnea   . Neuromuscular disorder    Past Surgical History  Procedure Date  . Abdominal hysterectomy   . Tonsillectomy and adenoidectomy   . Bladder suspension   . Colonoscopy w/ polypectomy 1995, [id number], 2007, 05/06/2011    adenomas  . Appendectomy   . Varicose vein surgery   . Tee without cardioversion 02/11/2012    Procedure: TRANSESOPHAGEAL ECHOCARDIOGRAM (TEE);  Surgeon: Laurey Morale, MD;  Location: [facility] ENDOSCOPY;  Service: Cardiovascular;  Laterality: N/A;  . Atrial fibrillation ablation 02/12/12    PVI by Dr Johney Frame    Current Outpatient Prescriptions  Medication Sig Dispense Refill  . calcium carbonate (OS-CAL) 600 MG TABS Take 300-600 mg by mouth 2 (two) times daily. Takes 1 tablet in Julie morning and 1/2 tablet at night      . Cholecalciferol (VITAMIN D PO) Take 4,000 Units by mouth daily.        Marland Kitchen dofetilide (TIKOSYN) 125 MCG capsule Take 375 mcg by mouth 2 (two) times daily.      . fish oil-omega-3 fatty acids 1000 MG capsule Take 2 g by mouth 2 (two) times daily.       Marland Kitchen lisinopril (PRINIVIL,ZESTRIL) 40  MG tablet Take 1 tablet (40 mg total) by mouth daily.  30 tablet  6  . Magnesium 200 MG TABS Take 200 mg by mouth daily.       . Multiple Vitamins-Minerals (MULTIVITAMIN WITH MINERALS) tablet Take 1 tablet by mouth daily.        . pantoprazole (PROTONIX) 40 MG tablet Take 1 tablet (40 mg total) by mouth daily.  30 tablet  11  . polyethylene glycol (MIRALAX / GLYCOLAX) packet Take 17 g by mouth daily.      . potassium chloride SA (K-DUR,KLOR-CON) 20 MEQ tablet Take 20 mEq by mouth daily.      . pravastatin (PRAVACHOL) 40 MG tablet Take 80 mg by mouth daily.       . vitamin E (VITAMIN E) 400 UNIT capsule Take 800 Units by mouth daily.      Marland Kitchen warfarin (COUMADIN) 5 MG tablet Take 5-7.5 mg by mouth See admin instructions. Takes 1 tablet (5mg ) on Sunday, and 1.5 tablets (7.5mg ) Monday-Saturday      . clonazePAM (KLONOPIN) 0.5 MG tablet Take 0.25 mg by mouth 2 (two) times daily as needed. For anxiety      . DISCONTD: ranitidine (ZANTAC) 150 MG capsule Take 150 mg by mouth daily.        Allergies  Allergen Reactions  . Codeine Other (See  Comments)    Skin crawls  . Prednisone Other (See Comments)    tachycardia    History   Social History  . Marital Status: Married    Spouse Name: N/A    Number of Children: N/A  . Years of Education: N/A   Occupational History  . Not on file.   Social History Main Topics  . Smoking status: Never Smoker   . Smokeless tobacco: Never Used  . Alcohol Use: No  . Drug Use: No  . Sexually Active: Not on file   Other Topics Concern  . Not on file   Social History Narrative   Pt lives in [place] with spouse.  2 grown children.Retired Tax inspector.    Family History  Problem Relation Age of Onset  . Colon cancer Maternal Uncle   . Colon cancer Maternal Grandmother   . Diabetes Brother   . Heart failure Mother   . Lung cancer Father     Physical Exam: Filed Vitals:   03/29/12 1439  BP: 137/65  Pulse: 62  Resp: 18  Weight: 189 lb 6.4 oz  (85.911 kg)  SpO2: 97%    GEN- Julie Gill is well appearing, alert and oriented x 3 today.   Head- normocephalic, atraumatic Eyes-  Sclera clear, conjunctiva pink Ears- hearing intact Oropharynx- clear Neck- supple, no JVP Lymph- no cervical lymphadenopathy Lungs- Clear to ausculation bilaterally, normal work of breathing Heart- Regular rate and rhythm, no murmurs, rubs or gallops, PMI not laterally displaced GI- soft, NT, ND, + BS Extremities- no clubbing, cyanosis, or edema  Assessment and Plan:

## 2012-04-12 ENCOUNTER — Telehealth: Payer: Self-pay | Admitting: Internal Medicine

## 2012-04-12 NOTE — Telephone Encounter (Signed)
Will send her connection to care for Tikosyn's

## 2012-04-12 NOTE — Telephone Encounter (Signed)
Discussed with Dr Johney Frame, she is going to take all that she has then stop.  She will keep her follow up on 05/31/12 Patient aware and will finish what she has

## 2012-04-12 NOTE — Telephone Encounter (Signed)
NEW MESS:  Pt is about to go into dough nut hole because Tikosyn.  Is there any help available to her?  Samples/drug company?  Please call her back with this information.  Please call today if at all possible she is going out of town.

## 2012-04-15 ENCOUNTER — Telehealth: Payer: Self-pay | Admitting: Internal Medicine

## 2012-04-15 NOTE — Telephone Encounter (Signed)
Patient's daughter called to let Dr. Johney Frame know that patient had a third episode of A-fib since the ablation on 02/12/12 patient said this was the worse one, it happened last night about 11:30 Pm. This episode lasted about 28 minutes, her heart rate at  the beginning was 102 and then went down to 53 beat/minute twenty minutes later. Pt was nauseated , also she felt like her heart was beating every where in her chest. Patient denies chest pain nor pressure. B/P was 145/96. She is fine now she  just feels tired. Because of cost patient will stop her tykosyn medication when she runs out it. Daughter would like to know if it is okay to stop it abruptly without starting another medication.

## 2012-04-15 NOTE — Telephone Encounter (Signed)
New msg Pt's daughter called about her being in afib again after ablation. Please call

## 2012-04-17 NOTE — Telephone Encounter (Signed)
She remains within the 3 month healing phase post ablation. If she is having early afib post ablation, ideally, she should continue tikosyn. IF she has to stop it then we will monitor her afib burden off of drugs.  Keep scheduled follow-up.

## 2012-04-19 ENCOUNTER — Telehealth: Payer: Self-pay | Admitting: Internal Medicine

## 2012-04-19 NOTE — Telephone Encounter (Signed)
I have already talked with her daughter this morning and I am going to discuss with Dr Johney Frame and call them back.

## 2012-04-19 NOTE — Telephone Encounter (Signed)
Pt calling re having a-fib and some questions to ask kelly

## 2012-04-19 NOTE — Telephone Encounter (Signed)
She is unable to tolerate Flecainide.  She is in the doughnut whole  for her med coverage and the Tikosyn is very expensive.(has some benefit left just not a lot)  Is there another medication she can try instead that may be cheaper.  Call her back after 12:30pm.  She has really bad episodes of heartburn during afib

## 2012-04-28 NOTE — Telephone Encounter (Signed)
Spoke with the daughter and insurance is going to pay 1/3 of medication.  The med is going to cost her $700.00 for 3 months and he may stop when she follows up with Dr Johney Frame in July.  She has made arrangements to pay for med at this point and is doing well without further episodes of afib

## 2012-04-29 ENCOUNTER — Telehealth: Payer: Self-pay | Admitting: Internal Medicine

## 2012-04-29 NOTE — Telephone Encounter (Signed)
Patient request return call regarding medication and recent hair loss, she can be reached at [phone].

## 2012-04-29 NOTE — Telephone Encounter (Signed)
I talked with Dr Ladona Ridgel and he has not heard of  Tikosyn causing hair loss

## 2012-04-29 NOTE — Telephone Encounter (Signed)
Called and spoke with patient  She has been loosing her hair for end of Feb to the beginning of March.  She started to take Zinic

## 2012-05-06 ENCOUNTER — Telehealth: Payer: Self-pay | Admitting: Internal Medicine

## 2012-05-06 NOTE — Telephone Encounter (Signed)
Pt states that she was having horrible cramps in her feet and legs that would wake her up at night.  Her PCP instructed her to hold the pravastatin for 1 week to see if s/s improve.  She is no longer having cramps.  Pt wants to know if she can take Niaspan with the medications she is already on.  She was instructed that she could take this medication however the orders will need to come from her PCP since she is following her lipids.  Pt stated she did not know if her PCP would know what medications she could take.  She is assured that her PCP does know and is capable of managing her lipids.

## 2012-05-06 NOTE — Telephone Encounter (Signed)
Pt PCP took her off pravastatin, wants to try her husband's  niaspan 500mg  he has done well on this, pls call [phone]

## 2012-05-31 ENCOUNTER — Ambulatory Visit (INDEPENDENT_AMBULATORY_CARE_PROVIDER_SITE_OTHER): Payer: Medicare Other | Admitting: Internal Medicine

## 2012-05-31 ENCOUNTER — Encounter: Payer: Self-pay | Admitting: Internal Medicine

## 2012-05-31 VITALS — BP 132/74 | HR 80 | Ht 63.0 in | Wt 184.0 lb

## 2012-05-31 DIAGNOSIS — I4891 Unspecified atrial fibrillation: Secondary | ICD-10-CM

## 2012-05-31 NOTE — Patient Instructions (Addendum)
Your physician has recommended you make the following change in your medication: Stop your Tikosyn  Your physician wants you to follow-up in: 4 months.  You will receive a reminder letter in the mail two months in advance. If you don't receive a letter, please call our office to schedule the follow-up appointment.

## 2012-05-31 NOTE — Assessment & Plan Note (Signed)
Doing well s/p ablation without symptomatic recurrence of afib We will stop tikosyn today Continue coumadin and monitor  Return in 4 months.

## 2012-05-31 NOTE — Progress Notes (Signed)
PCP: MCNEILL,[name], MD  Julie Gill is a 71 y.o. female who presents today for routine electrophysiology followup.  Since last being seen in our clinic, the patient reports doing very well.  She is unaware of any afib.  Today, she denies symptoms of palpitations, chest pain, shortness of breath,  lower extremity edema, dizziness, presyncope, or syncope.  The patient is otherwise without complaint today.   Past Medical History  Diagnosis Date  . GERD (gastroesophageal reflux disease)   . Hyperlipidemia   . Hypertension   . Atrial fibrillation     CHADS VASC score 3. Intolerant to flecainide. s/p afib ablation 02/12/12  . Mild sleep apnea   . Sinus bradycardia   . Restless leg syndrome   . Anxiety   . Arthritis   . Obesity   . Sleep apnea   . Neuromuscular disorder    Past Surgical History  Procedure Date  . Abdominal hysterectomy   . Tonsillectomy and adenoidectomy   . Bladder suspension   . Colonoscopy w/ polypectomy 1995, [id number], 2007, 05/06/2011    adenomas  . Appendectomy   . Varicose vein surgery   . Tee without cardioversion 02/11/2012    Procedure: TRANSESOPHAGEAL ECHOCARDIOGRAM (TEE);  Surgeon: Laurey Morale, MD;  Location: [facility] ENDOSCOPY;  Service: Cardiovascular;  Laterality: N/A;  . Atrial fibrillation ablation 02/12/12    PVI by Dr Johney Frame    Current Outpatient Prescriptions  Medication Sig Dispense Refill  . calcium carbonate (OS-CAL) 600 MG TABS Take 300-600 mg by mouth 2 (two) times daily. Takes 1 tablet in the morning and 1/2 tablet at night      . Cholecalciferol (VITAMIN D PO) Take 4,000 Units by mouth daily.        . clonazePAM (KLONOPIN) 0.5 MG tablet Take 0.25 mg by mouth 2 (two) times daily as needed. For anxiety      . fish oil-omega-3 fatty acids 1000 MG capsule Take 2 g by mouth 2 (two) times daily.       Marland Kitchen lisinopril (PRINIVIL,ZESTRIL) 40 MG tablet Take 1 tablet (40 mg total) by mouth daily.  30 tablet  6  . Magnesium 200 MG TABS Take 200 mg by mouth  daily.       . Multiple Vitamins-Minerals (MULTIVITAMIN WITH MINERALS) tablet Take 1 tablet by mouth daily.        . niacin (NIASPAN) 500 MG CR tablet Take 500 mg by mouth at bedtime.      . pantoprazole (PROTONIX) 40 MG tablet Take 1 tablet (40 mg total) by mouth daily.  30 tablet  11  . polyethylene glycol (MIRALAX / GLYCOLAX) packet Take 17 g by mouth daily.      . potassium chloride SA (K-DUR,KLOR-CON) 20 MEQ tablet Take 20 mEq by mouth daily.      . vitamin E (VITAMIN E) 400 UNIT capsule Take 800 Units by mouth daily.      Marland Kitchen warfarin (COUMADIN) 5 MG tablet Take 5-7.5 mg by mouth See admin instructions. Takes 1 tablet (5mg ) on Sunday, and 1.5 tablets (7.5mg ) Monday-Saturday      . DISCONTD: ranitidine (ZANTAC) 150 MG capsule Take 150 mg by mouth daily.        Physical Exam: Filed Vitals:   05/31/12 1616  BP: 132/74  Pulse: 80  Height: 5\' 3"  (1.6 m)  Weight: 184 lb (83.462 kg)    GEN- The patient is well appearing, alert and oriented x 3 today.   Head- normocephalic, atraumatic Eyes-  Sclera clear, conjunctiva pink Ears- hearing intact Oropharynx- clear Lungs- Clear to ausculation bilaterally, normal work of breathing Heart- Regular rate and rhythm, no murmurs, rubs or gallops, PMI not laterally displaced GI- soft, NT, ND, + BS Extremities- no clubbing, cyanosis, or edema  ekg today reveals sinus rhythm with occasional PACs, 71 bpm, PR 136, Qtc 469, otherwise normal ekg  Assessment and Plan:

## 2012-06-17 ENCOUNTER — Telehealth: Payer: Self-pay | Admitting: Internal Medicine

## 2012-06-17 NOTE — Telephone Encounter (Signed)
New msg Pt had blood work done at PCP office and wanted to discuss the results with you

## 2012-06-17 NOTE — Telephone Encounter (Signed)
Called patient and had long discussion with her in regards to her cholesterol medication.  She is going to call Dr Uvaldo Rising back and try another statin

## 2012-09-09 ENCOUNTER — Other Ambulatory Visit: Payer: Self-pay | Admitting: Family Medicine

## 2012-09-09 DIAGNOSIS — Z1231 Encounter for screening mammogram for malignant neoplasm of breast: Secondary | ICD-10-CM

## 2012-10-04 ENCOUNTER — Telehealth: Payer: Self-pay | Admitting: Internal Medicine

## 2012-10-04 NOTE — Telephone Encounter (Signed)
Called patient back and let us know she has joined the [employer].  She is doing the GXT and bike.  Just after walking at 1.5 for a few min she bumps up to 2.7 she feels a heaviness in chest and a burning in her throat. She bumps back down to 1.5 and then gradually speed back up to 2.0-2.7 and she doesn't have symptoms She is doing the GXT and bike for 20-30 min a piece.   Denies SOB  When she rides the bike she has no symptoms.  Wanted to run this by Dr Johney Frame for his opinion

## 2012-10-04 NOTE — Telephone Encounter (Signed)
Discussed with Dr Johney Frame  Will have the patient come in and see the PA to possibly order a stress test.  She wants to come in next week to see the PA due to having several other appointments this week.  She is asymptomatic unless she is on the GXT and then it comes and goes.  But eventually resolves as she continues to walk.  She will call back if symptoms worsen

## 2012-10-04 NOTE — Telephone Encounter (Signed)
New Problem:    Patient called in wanting to speak with you about some issues she's been having.  Please call back, and call her cell phone if she cannot be reached at home.

## 2012-10-05 ENCOUNTER — Ambulatory Visit
Admission: RE | Admit: 2012-10-05 | Discharge: 2012-10-05 | Disposition: A | Discharge status: Home / Self Care | Payer: Medicare Other | Source: Ambulatory Visit | Attending: Family Medicine | Admitting: Family Medicine

## 2012-10-05 DIAGNOSIS — Z1231 Encounter for screening mammogram for malignant neoplasm of breast: Secondary | ICD-10-CM

## 2012-10-11 ENCOUNTER — Ambulatory Visit (INDEPENDENT_AMBULATORY_CARE_PROVIDER_SITE_OTHER): Payer: Medicare Other | Admitting: Nurse Practitioner

## 2012-10-11 ENCOUNTER — Encounter: Payer: Self-pay | Admitting: Nurse Practitioner

## 2012-10-11 VITALS — BP 130/72 | HR 56 | Ht 63.0 in | Wt 192.6 lb

## 2012-10-11 DIAGNOSIS — R079 Chest pain, unspecified: Secondary | ICD-10-CM

## 2012-10-11 MED ORDER — NITROGLYCERIN 0.4 MG SL SUBL: 0.4000 mg | SUBLINGUAL_TABLET | SUBLINGUAL | Status: DC | PRN

## 2012-10-11 NOTE — Progress Notes (Signed)
Julie Gill Date of Birth: 22-Oct-1941 Medical Record #[id number]  History of Present Illness: Julie Gill is seen today for a work in visit. She is seen for Dr. Johney Frame. She has no known CAD. Does have atrial fib and has had prior ablation. Her other issues are GERD, HLD, HTN, mild sleep apnea, bradycardia, restless legs, anxiety and obesity. She had a negative Myoview 11 months ago.   She comes in today. She is here alone.  She called last week to report that she had joined a gym at the beginning of October. Has been using the treadmill and the bike. Notes that with the walking, when she increases the speed, she had this burning in her throat and a heaviness in her chest. If she turned the speed back down, her symptoms resolved. She could then gradually increase the speed back up and did ok. She is able to exercise for 20 to 30 minutes a piece for a total of an hour. Not short of breath. If she only rides the bike, she has no symptoms whatsoever. Her symptoms have actually gotten better over the past week. She has kept her speed at 2.2 mph. She has no symptoms with doing her housework or going up steps. No atrial fib but she does endorse some skips.   Current Outpatient Prescriptions on File Prior to Visit  Medication Sig Dispense Refill  . calcium carbonate (OS-CAL) 600 MG TABS Take 300-600 mg by mouth 2 (two) times daily. Takes 1 tablet in the morning and 1/2 tablet at night      . Cholecalciferol (VITAMIN D PO) Take 4,000 Units by mouth daily.        . clonazePAM (KLONOPIN) 0.5 MG tablet Take 0.25 mg by mouth 2 (two) times daily as needed. For anxiety      . fish oil-omega-3 fatty acids 1000 MG capsule Take 2 g by mouth 2 (two) times daily.       Marland Kitchen lisinopril (PRINIVIL,ZESTRIL) 40 MG tablet Take 1 tablet (40 mg total) by mouth daily.  30 tablet  6  . lovastatin (MEVACOR) 40 MG tablet Take 40 mg by mouth at bedtime.       . Magnesium 200 MG TABS Take 200 mg by mouth daily.       . Multiple  Vitamins-Minerals (MULTIVITAMIN WITH MINERALS) tablet Take 1 tablet by mouth daily.        . pantoprazole (PROTONIX) 40 MG tablet Take 1 tablet (40 mg total) by mouth daily.  30 tablet  11  . polyethylene glycol (MIRALAX / GLYCOLAX) packet Take 17 g by mouth daily.      . potassium chloride SA (K-DUR,KLOR-CON) 20 MEQ tablet Take 20 mEq by mouth daily.      . vitamin E (VITAMIN E) 400 UNIT capsule Take 800 Units by mouth daily.      Marland Kitchen warfarin (COUMADIN) 5 MG tablet Take 5-7.5 mg by mouth See admin instructions. Takes 1 tablet (5mg ) on Sunday, and 1.5 tablets (7.5mg ) Monday-Saturday      . nitroGLYCERIN (NITROSTAT) 0.4 MG SL tablet Place 1 tablet (0.4 mg total) under the tongue every 5 (five) minutes as needed for chest pain.  25 tablet  3  . [DISCONTINUED] ranitidine (ZANTAC) 150 MG capsule Take 150 mg by mouth daily.        Allergies  Allergen Reactions  . Codeine Other (See Comments)    Skin crawls  . Prednisone Other (See Comments)    tachycardia  Past Medical History  Diagnosis Date  . GERD (gastroesophageal reflux disease)   . Hyperlipidemia   . Hypertension   . Atrial fibrillation     CHADS VASC score 3. Intolerant to flecainide. s/p afib ablation 02/12/12  . Mild sleep apnea   . Sinus bradycardia   . Restless leg syndrome   . Anxiety   . Arthritis   . Obesity   . Sleep apnea   . Neuromuscular disorder     Past Surgical History  Procedure Date  . Abdominal hysterectomy   . Tonsillectomy and adenoidectomy   . Bladder suspension   . Colonoscopy w/ polypectomy 1995, [id number], 2007, 05/06/2011    adenomas  . Appendectomy   . Varicose vein surgery   . Tee without cardioversion 02/11/2012    Procedure: TRANSESOPHAGEAL ECHOCARDIOGRAM (TEE);  Surgeon: Laurey Morale, MD;  Location: [facility] ENDOSCOPY;  Service: Cardiovascular;  Laterality: N/A;  . Atrial fibrillation ablation 02/12/12    PVI by Dr Johney Frame    History  Smoking status  . Never Smoker   Smokeless tobacco  .  Never Used    History  Alcohol Use No    Family History  Problem Relation Age of Onset  . Colon cancer Maternal Uncle   . Colon cancer Maternal Grandmother   . Diabetes Brother   . Heart failure Mother   . Lung cancer Father     Review of Systems: The review of systems is per the HPI.  All other systems were reviewed and are negative.  Physical Exam: BP 130/72  Pulse 56  Ht 5\' 3"  (1.6 m)  Wt 192 lb 9.6 oz (87.363 kg)  BMI 34.12 kg/m2 Patient is very pleasant and in no acute distress. She is obese. Skin is warm and dry. Color is normal.  HEENT is unremarkable. Normocephalic/atraumatic. PERRL. Sclera are nonicteric. Neck is supple. No masses. No JVD. Lungs are clear. Cardiac exam shows a regular rate and rhythm. Abdomen is soft. Extremities are without edema. Gait and ROM are intact. No gross neurologic deficits noted.   LABORATORY DATA: EKG today shows sinus rhythm with PAC's.   Lab Results  Component Value Date   WBC 5.6 02/05/2012   HGB 11.9* 02/05/2012   HCT 36.4 02/05/2012   PLT 240.0 02/05/2012   GLUCOSE 92 02/13/2012   NA 143 02/13/2012   K 3.8 02/13/2012   CL 107 02/13/2012   CREATININE 0.69 02/13/2012   BUN 14 02/13/2012   CO2 29 02/13/2012   INR 2.71* 02/13/2012    Myoview Overall Impression:  There was marked increase in systolic BP early in stress. The patient then had significant chest pain and calf tightness. There was no EKG change. The nuclear images are normal. There is no scar or ischemia.   Willa Rough, MD  Assessment / Plan: 1. Atypical chest pain - She has only had the discomfort with walking and turning up the speed. Her symptoms have basically resolved over the last week. She is able to use the bike and do her housework without any issue. Her Myoview perfusion images were normal back in December. In light of her symptoms improving, we have elected to just monitor her for now. I did give her NTG to have on hand prn with full instruction. She needs to continue  to exercise. Should be able to talk comfortably during her work out. She will gradually increase her speed by only 1 to 2 mph. I will see her back in a month. If she  has worsening of her symptoms, she will need either repeat stress testing or cardiac catheterization.   2. Obesity - we discussed ways to lose weight today including my 5 step method.   I will see her back in a month to reassess. Patient is agreeable to this plan and will call if any problems develop in the interim.

## 2012-10-11 NOTE — Patient Instructions (Addendum)
Keep up with your exercise program  I have sent in a prescription for NTG. Use your NTG under your tongue for recurrent chest pain. May take one tablet every 5 minutes. If you are still having discomfort after 3 tablets in 15 minutes, call 911.  Here are my tips to lose weight:  1. Drink only water. You do not need milk, juice, tea, soda or diet soda.  2. Do not eat anything "white". This includes white bread, potatoes, rice or mayo  3. Stay away from fried foods and sweets  4. Your portion should be the size of the palm of your hand.  5. Know what your weaknesses are and avoid.  6. Find an exercise you like and do it every day for 45 to 60 minutes.       I would like to see you in a month. Sooner if your symptoms persist or worsen  Call the [facility] office at [phone] if you have any questions, problems or concerns.

## 2012-11-09 ENCOUNTER — Encounter: Payer: Self-pay | Admitting: Nurse Practitioner

## 2012-11-09 ENCOUNTER — Ambulatory Visit (INDEPENDENT_AMBULATORY_CARE_PROVIDER_SITE_OTHER): Payer: Medicare Other | Admitting: Nurse Practitioner

## 2012-11-09 VITALS — BP 150/64 | HR 64 | Ht 63.0 in | Wt 190.8 lb

## 2012-11-09 DIAGNOSIS — I10 Essential (primary) hypertension: Secondary | ICD-10-CM

## 2012-11-09 DIAGNOSIS — R079 Chest pain, unspecified: Secondary | ICD-10-CM

## 2012-11-09 LAB — BASIC METABOLIC PANEL
BUN: 14 mg/dL (ref 6–23)
CO2: 28 mEq/L (ref 19–32)
Calcium: 9.1 mg/dL (ref 8.4–10.5)
Chloride: 102 mEq/L (ref 96–112)
Creatinine, Ser: 0.8 mg/dL (ref 0.4–1.2)
GFR: 78.89 mL/min (ref >60.00)
Glucose, Bld: 187 mg/dL — ABNORMAL HIGH (ref 70–99)
Potassium: 3.7 mEq/L (ref 3.5–5.1)
Sodium: 137 mEq/L (ref 135–145)

## 2012-11-09 MED ORDER — HYDROCHLOROTHIAZIDE 25 MG PO TABS: 12.5000 mg | ORAL_TABLET | Freq: Every day | ORAL | Status: DC

## 2012-11-09 NOTE — Patient Instructions (Addendum)
Keep up with your exercise and weight loss  Monitor your blood pressure at home. Goal BP is less than 135/85  We will add HCTZ 25 mg to take only a half a tablet (12.5 mg) each morning. This should help with your blood pressure and your swelling  We will check lab work today.   I will see you back in a month.  Call the [facility] office at [phone] if you have any questions, problems or concerns.

## 2012-11-09 NOTE — Progress Notes (Addendum)
Julie Gill Date of Birth: [birth year] Medical Record #[id number]  History of Present Illness: Julie Gill is seen today for a one month check. She is seen for Dr. [place]. She has no known CAD. She does have PAF with prior ablation. Other issues include GERD, HLD, HTN, mild sleep apnea, bradycardia, restless legs, anxiety and obesity. She had a negative Myoview one year ago.  I saw her last month with some chest pain only with increasing the speed on the treadmill. She had no symptoms with biking or with her household activities. Her symptoms had improved between the time she made the appointment and she was seen. We elected to take a "watch and wait" approach.   She comes back today. She says she is doing better. She continues to exercise and work on her weight. She has only had one spell of throat burning on the treadmill that was very short lived and she thinks she got scared when she saw her heart rate at 120. She has gotten some bursitis in her hips. She is now just riding the bike and doing the stepper without any issue. She continues to do all of her housework without any issue. She has had one short lived episode of atrial fib that occurred on Thanksgiving night. She was a little stressed that day. No caffeine or alcohol use that day. It lasted for about an hour and then resolved with recurrence. Overall, she does feel like she is doing better. She does note that she has woken up with a headache for the past 3 mornings. This is usually a clue to her that her BP is up. It is up here today. She has had some mild swelling. She admits that she really likes salt.   Current Outpatient Prescriptions on File Prior to Visit  Medication Sig Dispense Refill  . calcium carbonate (OS-CAL) 600 MG TABS Take 300-600 mg by mouth 2 (two) times daily. Takes 1 tablet in the morning and 1/2 tablet at night      . Cholecalciferol (VITAMIN D PO) Take 4,000 Units by mouth daily.        . clonazePAM (KLONOPIN) 0.5  MG tablet Take 0.25 mg by mouth 2 (two) times daily as needed. For anxiety      . fish oil-omega-3 fatty acids 1000 MG capsule Take 2 g by mouth 2 (two) times daily.       Marland Kitchen lisinopril (PRINIVIL,ZESTRIL) 40 MG tablet Take 1 tablet (40 mg total) by mouth daily.  30 tablet  6  . lovastatin (MEVACOR) 40 MG tablet Take 40 mg by mouth at bedtime.       . Magnesium 200 MG TABS Take 200 mg by mouth daily.       . Multiple Vitamins-Minerals (MULTIVITAMIN WITH MINERALS) tablet Take 1 tablet by mouth daily.        . nitroGLYCERIN (NITROSTAT) 0.4 MG SL tablet Place 1 tablet (0.4 mg total) under the tongue every 5 (five) minutes as needed for chest pain.  25 tablet  3  . pantoprazole (PROTONIX) 40 MG tablet Take 1 tablet (40 mg total) by mouth daily.  30 tablet  11  . polyethylene glycol (MIRALAX / GLYCOLAX) packet Take 17 g by mouth daily.      . potassium chloride SA (K-DUR,KLOR-CON) 20 MEQ tablet Take 20 mEq by mouth daily.      . vitamin E (VITAMIN E) 400 UNIT capsule Take 800 Units by mouth daily.      Marland Kitchen  warfarin (COUMADIN) 5 MG tablet Take 5-7.5 mg by mouth See admin instructions. Takes 1 tablet (5mg ) on Sunday, and 1.5 tablets (7.5mg ) Monday-Saturday      . [DISCONTINUED] ranitidine (ZANTAC) 150 MG capsule Take 150 mg by mouth daily.        Allergies  Allergen Reactions  . Codeine Other (See Comments)    Skin crawls  . Prednisone Other (See Comments)    tachycardia    Past Medical History  Diagnosis Date  . GERD (gastroesophageal reflux disease)   . Hyperlipidemia   . Hypertension   . Atrial fibrillation     CHADS VASC score 3. Intolerant to flecainide. s/p afib ablation 02/12/12  . Mild sleep apnea   . Sinus bradycardia   . Restless leg syndrome   . Anxiety   . Arthritis   . Obesity   . Sleep apnea   . Neuromuscular disorder     Past Surgical History  Procedure Date  . Abdominal hysterectomy   . Tonsillectomy and adenoidectomy   . Bladder suspension   . Colonoscopy w/  polypectomy 1995, [id number], 2007, 05/06/2011    adenomas  . Appendectomy   . Varicose vein surgery   . Tee without cardioversion 02/11/2012    Procedure: TRANSESOPHAGEAL ECHOCARDIOGRAM (TEE);  Surgeon: Laurey Morale, MD;  Location: [facility] ENDOSCOPY;  Service: Cardiovascular;  Laterality: N/A;  . Atrial fibrillation ablation 02/12/12    PVI by Dr Johney Frame    History  Smoking status  . Never Smoker   Smokeless tobacco  . Never Used    History  Alcohol Use No    Family History  Problem Relation Age of Onset  . Colon cancer Maternal Uncle   . Colon cancer Maternal Grandmother   . Diabetes Brother   . Heart failure Mother   . Lung cancer Father     Review of Systems: The review of systems is per the HPI.  All other systems were reviewed and are negative.  Physical Exam: BP 150/64  Ht 5\' 3"  (1.6 m)  Wt 190 lb 12.8 oz (86.546 kg)  BMI 33.80 kg/m2 Her weight is down 2 pounds.  Repeat blood pressure by me is 150/80.  Patient is very pleasant and in no acute distress. Skin is warm and dry. Color is normal.  HEENT is unremarkable. Normocephalic/atraumatic. PERRL. Sclera are nonicteric. Neck is supple. No masses. No JVD. Lungs are clear. Cardiac exam shows a regular rate and rhythm. Abdomen is soft. Extremities are with just trace edema. Gait and ROM are intact. No gross neurologic deficits noted.   LABORATORY DATA: BMET is pending.   Myoview Overall Impression from December 2012:   There was marked increase in systolic BP early in stress. The patient then had significant chest pain and calf tightness. There was no EKG change. The nuclear images are normal. There is no scar or ischemia.   Julie Rough, MD   Lab Results  Component Value Date   WBC 5.6 02/05/2012   HGB 11.9* 02/05/2012   HCT 36.4 02/05/2012   PLT 240.0 02/05/2012   GLUCOSE 92 02/13/2012   NA 143 02/13/2012   K 3.8 02/13/2012   CL 107 02/13/2012   CREATININE 0.69 02/13/2012   BUN 14 02/13/2012   CO2 29 02/13/2012   INR  2.71* 02/13/2012   No results found for this basename: CHOL,  HDL,  LDLCALC,  LDLDIRECT,  TRIG,  CHOLHDL   Assessment / Plan:  1. Atypical chest pain - she feels like  she is doing better. She continues to ride the bike and use the stepper without any symptoms. No NTG use. Will continue to observe but if she has recurrence, will need either repeat stress testing or cath.   2. Obesity - she is actively working on her weight. She is down 2 pounds.   3. HTN - will add very low dose HCTZ. Check BMET today.  4. PAF - one short lived episode. Seems stress related from the holiday.   I will see her back in a month. She is to monitor her blood pressure at home. Goal is less than 135/85.   Patient is agreeable to this plan and will call if any problems develop in the interim.

## 2012-12-07 ENCOUNTER — Encounter: Payer: Self-pay | Admitting: Nurse Practitioner

## 2012-12-07 ENCOUNTER — Ambulatory Visit (INDEPENDENT_AMBULATORY_CARE_PROVIDER_SITE_OTHER): Payer: Medicare Other | Admitting: Nurse Practitioner

## 2012-12-07 VITALS — BP 120/72 | HR 78 | Ht 63.0 in | Wt 194.0 lb

## 2012-12-07 DIAGNOSIS — R079 Chest pain, unspecified: Secondary | ICD-10-CM

## 2012-12-07 DIAGNOSIS — I480 Paroxysmal atrial fibrillation: Secondary | ICD-10-CM

## 2012-12-07 DIAGNOSIS — I10 Essential (primary) hypertension: Secondary | ICD-10-CM

## 2012-12-07 DIAGNOSIS — I4891 Unspecified atrial fibrillation: Secondary | ICD-10-CM

## 2012-12-07 LAB — BASIC METABOLIC PANEL
BUN: 16 mg/dL (ref 6–23)
CO2: 33 mEq/L — ABNORMAL HIGH (ref 19–32)
Calcium: 9 mg/dL (ref 8.4–10.5)
Chloride: 104 mEq/L (ref 96–112)
Creatinine, Ser: 0.9 mg/dL (ref 0.4–1.2)
GFR: 64.23 mL/min (ref >60.00)
Glucose, Bld: 99 mg/dL (ref 70–99)
Potassium: 4.1 mEq/L (ref 3.5–5.1)
Sodium: 140 mEq/L (ref 135–145)

## 2012-12-07 NOTE — Patient Instructions (Addendum)
Stay on your current medicines.  We need to recheck your labs today to follow up on your potassium and kidney function  We will see you back in July with Dr. Johney Frame  Call the [facility] office at [phone] if you have any questions, problems or concerns.

## 2012-12-07 NOTE — Progress Notes (Signed)
[name] Date of Birth: 1941-10-08 Medical Record #[id number]  History of Present Illness: Julie Gill is seen back today for a one month check. She is seen for Dr. Johney Frame. She has no known CAD. Does have PAF with prior ablation. Other issues include GERD, HLD, HTN, mild sleep apnea, bradycardia, restless legs, anxiety and obesity. She had a negative Myoview last December 2012.    I saw her back in November with chest pain with only increasing the speed on the treadmill. She had no chest pain with biking or using the stepper or with doing her housework. We elected a watch and wait approach. I saw her last month. BP was up. She endorses too much salt. We added low dose HCTZ. She had had one very brief spell of PAF.  She comes back today. She is here alone. She is doing ok. She brings in a list of BP's that show fair control at home. Has had 2 brief episodes of atrial fib over the holidays. Very short lived. Otherwise doing ok. No more chest pain whatsoever. Has not been able to exercise however due to some bursitis. Overall, she feels like she is doing ok.   Current Outpatient Prescriptions on File Prior to Visit  Medication Sig Dispense Refill  . calcium carbonate (OS-CAL) 600 MG TABS Take 300-600 mg by mouth 2 (two) times daily. Takes 1 tablet in the morning and 1/2 tablet at night      . Cholecalciferol (VITAMIN D PO) Take 4,000 Units by mouth daily.        . clonazePAM (KLONOPIN) 0.5 MG tablet Take 0.25 mg by mouth 2 (two) times daily as needed. For anxiety      . fish oil-omega-3 fatty acids 1000 MG capsule Take 2 g by mouth 2 (two) times daily.       . hydrochlorothiazide (HYDRODIURIL) 25 MG tablet Take 0.5 tablets (12.5 mg total) by mouth daily.  30 tablet  3  . lisinopril (PRINIVIL,ZESTRIL) 40 MG tablet Take 1 tablet (40 mg total) by mouth daily.  30 tablet  6  . lovastatin (MEVACOR) 40 MG tablet Take 40 mg by mouth at bedtime.       . Magnesium 200 MG TABS Take 200 mg by mouth daily.        . Multiple Vitamins-Minerals (MULTIVITAMIN WITH MINERALS) tablet Take 1 tablet by mouth daily.        . nitroGLYCERIN (NITROSTAT) 0.4 MG SL tablet Place 1 tablet (0.4 mg total) under the tongue every 5 (five) minutes as needed for chest pain.  25 tablet  3  . pantoprazole (PROTONIX) 40 MG tablet Take 1 tablet (40 mg total) by mouth daily.  30 tablet  11  . polyethylene glycol (MIRALAX / GLYCOLAX) packet Take 17 g by mouth daily.      . potassium chloride SA (K-DUR,KLOR-CON) 20 MEQ tablet Take 20 mEq by mouth daily.      . vitamin E (VITAMIN E) 400 UNIT capsule Take 800 Units by mouth daily.      Marland Kitchen warfarin (COUMADIN) 5 MG tablet Take 5-7.5 mg by mouth See admin instructions. Takes 1 tablet (5mg ) on Sunday, and 1.5 tablets (7.5mg ) Monday-Saturday      . [DISCONTINUED] ranitidine (ZANTAC) 150 MG capsule Take 150 mg by mouth daily.        Allergies  Allergen Reactions  . Codeine Other (See Comments)    Skin crawls  . Prednisone Other (See Comments)    tachycardia  Past Medical History  Diagnosis Date  . GERD (gastroesophageal reflux disease)   . Hyperlipidemia   . Hypertension   . Atrial fibrillation     CHADS VASC score 3. Intolerant to flecainide. s/p afib ablation 02/12/12  . Mild sleep apnea   . Sinus bradycardia   . Restless leg syndrome   . Anxiety   . Arthritis   . Obesity   . Sleep apnea   . Neuromuscular disorder     Past Surgical History  Procedure Date  . Abdominal hysterectomy   . Tonsillectomy and adenoidectomy   . Bladder suspension   . Colonoscopy w/ polypectomy 1995, [id number], 2007, 05/06/2011    adenomas  . Appendectomy   . Varicose vein surgery   . Tee without cardioversion 02/11/2012    Procedure: TRANSESOPHAGEAL ECHOCARDIOGRAM (TEE);  Surgeon: Laurey Morale, MD;  Location: [facility] ENDOSCOPY;  Service: Cardiovascular;  Laterality: N/A;  . Atrial fibrillation ablation 02/12/12    PVI by Dr Johney Frame    History  Smoking status  . Never Smoker   Smokeless  tobacco  . Never Used    History  Alcohol Use No    Family History  Problem Relation Age of Onset  . Colon cancer Maternal Uncle   . Colon cancer Maternal Grandmother   . Diabetes Brother   . Heart failure Mother   . Lung cancer Father     Review of Systems: The review of systems is per the HPI.  All other systems were reviewed and are negative.  Physical Exam: BP 120/72  Pulse 78  Ht 5\' 3"  (1.6 m)  Wt 194 lb (87.998 kg)  BMI 34.37 kg/m2 Patient is very pleasant and in no acute distress. Skin is warm and dry. Color is normal.  HEENT is unremarkable. Normocephalic/atraumatic. PERRL. Sclera are nonicteric. Neck is supple. No masses. No JVD. Lungs are clear. Cardiac exam shows a regular rate and rhythm. She has some occasional ectopics noted on exam. Abdomen is soft. Extremities are without edema. Gait and ROM are intact. No gross neurologic deficits noted.   LABORATORY DATA: BMET is pending for today.   Lab Results  Component Value Date   WBC 5.6 02/05/2012   HGB 11.9* 02/05/2012   HCT 36.4 02/05/2012   PLT 240.0 02/05/2012   GLUCOSE 187* 11/09/2012   NA 137 11/09/2012   K 3.7 11/09/2012   CL 102 11/09/2012   CREATININE 0.8 11/09/2012   BUN 14 11/09/2012   CO2 28 11/09/2012   INR 2.71* 02/13/2012     Assessment / Plan: 1. HTN - BP is ok. Has fairly good control at home. No change in her current regimen. See her back in July with Dr. Johney Frame. Recheck BMET today.   2. Chest pain - resolved without recurrence.  3. PAF - brief breakthru spells reported. Remains on coumadin.   Patient is agreeable to this plan and will call if any problems develop in the interim.

## 2012-12-13 ENCOUNTER — Telehealth: Payer: Self-pay | Admitting: Internal Medicine

## 2012-12-13 MED ORDER — DILTIAZEM HCL ER COATED BEADS 120 MG PO CP24: 120.0000 mg | ORAL_CAPSULE | Freq: Every day | ORAL | Status: DC

## 2012-12-13 NOTE — Telephone Encounter (Signed)
Can stop the HCTZ and try Diltiazem ER 120 mg daily. Continue to monitor BP at home. Bring readings in for next visit.

## 2012-12-13 NOTE — Telephone Encounter (Signed)
Pt needs something else for B/P other than HCTZ because it gives patient leg cramps what do you suggest

## 2012-12-13 NOTE — Telephone Encounter (Signed)
tw/ pt agreed to start diltiazem 120mg  daily if affordable and stop hctz 12.5 mg daily due to leg cramps script called into pharm also advised to check bp at home and monitor and keep log pt agree

## 2012-12-14 ENCOUNTER — Telehealth: Payer: Self-pay | Admitting: *Deleted

## 2012-12-14 MED ORDER — METOPROLOL TARTRATE 25 MG PO TABS: 25.0000 mg | ORAL_TABLET | Freq: Two times a day (BID) | ORAL | Status: DC

## 2012-12-14 NOTE — Telephone Encounter (Signed)
She can try Lopressor 25 mg two times a day. If not able to tolerate, I do not have much else to offer. Needs to really be working hard on her diet/losing weight/exercising. Needs to continue to monitor her blood pressure at home. Follow up with Dr. Johney Frame prn.

## 2012-12-14 NOTE — Telephone Encounter (Signed)
Pt rtn your call from yesterday and she has a dental appt this morning and if no answer please leave a message. The medication was $18 and that is still too much with all her other meds she was looking to pay around $4

## 2012-12-14 NOTE — Telephone Encounter (Signed)
lmom as requested by pt, start lopressor 25 mg bid and try hard to work on diet and exercise f/u allred prn

## 2013-06-06 ENCOUNTER — Ambulatory Visit (INDEPENDENT_AMBULATORY_CARE_PROVIDER_SITE_OTHER): Payer: Medicare Other | Admitting: Internal Medicine

## 2013-06-06 ENCOUNTER — Encounter: Payer: Self-pay | Admitting: Internal Medicine

## 2013-06-06 VITALS — BP 128/68 | HR 63 | Ht 63.0 in | Wt 195.0 lb

## 2013-06-06 DIAGNOSIS — I4891 Unspecified atrial fibrillation: Secondary | ICD-10-CM

## 2013-06-06 DIAGNOSIS — I10 Essential (primary) hypertension: Secondary | ICD-10-CM

## 2013-06-06 NOTE — Progress Notes (Signed)
PCP:  MCNEILL,[name], MD  The patient presents today for routine electrophysiology followup.  Since last being seen in our clinic, the patient reports doing very well.  She reports that she has occasonal palpitations, lasting only several seconds.  These are not very symptomatic.  She is pleased with the results of her ablation and has not had any sustained arrhythmias.   Today, she denies symptoms of  chest pain, shortness of breath, orthopnea, PND, lower extremity edema, dizziness, presyncope, syncope, or neurologic sequela.  The patient feels that she is tolerating medications without difficulties and is otherwise without complaint today.   Past Medical History  Diagnosis Date  . GERD (gastroesophageal reflux disease)   . Hyperlipidemia   . Hypertension   . Atrial fibrillation     CHADS VASC score 3. Intolerant to flecainide. s/p afib ablation 02/12/12  . Mild sleep apnea   . Sinus bradycardia   . Restless leg syndrome   . Anxiety   . Arthritis   . Obesity   . Sleep apnea   . Neuromuscular disorder    Past Surgical History  Procedure Laterality Date  . Abdominal hysterectomy    . Tonsillectomy and adenoidectomy    . Bladder suspension    . Colonoscopy w/ polypectomy  1995, [id number], 2007, 05/06/2011    adenomas  . Appendectomy    . Varicose vein surgery    . Tee without cardioversion  02/11/2012    Procedure: TRANSESOPHAGEAL ECHOCARDIOGRAM (TEE);  Surgeon: Laurey Morale, MD;  Location: [facility] ENDOSCOPY;  Service: Cardiovascular;  Laterality: N/A;  . Atrial fibrillation ablation  02/12/12    PVI by Dr Johney Frame    Current Outpatient Prescriptions  Medication Sig Dispense Refill  . calcium carbonate (OS-CAL) 600 MG TABS Take 300-600 mg by mouth 2 (two) times daily. Takes 1 tablet in the morning and 1/2 tablet at night      . Cholecalciferol (VITAMIN D PO) Take 4,000 Units by mouth daily.        . clonazePAM (KLONOPIN) 0.5 MG tablet Take 0.25 mg by mouth 2 (two) times daily as  needed. For anxiety      . fish oil-omega-3 fatty acids 1000 MG capsule Take 2 g by mouth 2 (two) times daily.       Marland Kitchen lovastatin (MEVACOR) 40 MG tablet Take 40 mg by mouth at bedtime.       . Magnesium 200 MG TABS Take 200 mg by mouth daily.       . metoprolol (LOPRESSOR) 25 MG tablet Take 1 tablet (25 mg total) by mouth 2 (two) times daily.  60 tablet  11  . Multiple Vitamins-Minerals (MULTIVITAMIN WITH MINERALS) tablet Take 1 tablet by mouth daily.        . nitroGLYCERIN (NITROSTAT) 0.4 MG SL tablet Place 1 tablet (0.4 mg total) under the tongue every 5 (five) minutes as needed for chest pain.  25 tablet  3  . polyethylene glycol (MIRALAX / GLYCOLAX) packet Take 17 g by mouth daily.      . potassium chloride SA (K-DUR,KLOR-CON) 20 MEQ tablet Take 20 mEq by mouth daily.      . vitamin E (VITAMIN E) 400 UNIT capsule Take 800 Units by mouth daily.      Marland Kitchen lisinopril (PRINIVIL,ZESTRIL) 40 MG tablet Take 1 tablet (40 mg total) by mouth daily.  30 tablet  6  . pantoprazole (PROTONIX) 40 MG tablet Take 1 tablet (40 mg total) by mouth daily.  30 tablet  11  . warfarin (COUMADIN) 5 MG tablet Take 5-7.5 mg by mouth See admin instructions. Takes 1 tablet (5mg ) on Sunday, and 1.5 tablets (7.5mg ) Monday-Saturday      . [DISCONTINUED] ranitidine (ZANTAC) 150 MG capsule Take 150 mg by mouth daily.       No current facility-administered medications for this visit.    Allergies  Allergen Reactions  . Codeine Other (See Comments)    Skin crawls  . Prednisone Other (See Comments)    tachycardia    History   Social History  . Marital Status: Married    Spouse Name: N/A    Number of Children: N/A  . Years of Education: N/A   Occupational History  . Not on file.   Social History Main Topics  . Smoking status: Never Smoker   . Smokeless tobacco: Never Used  . Alcohol Use: No  . Drug Use: No  . Sexually Active: Yes    Birth Control/ Protection: Post-menopausal   Other Topics Concern  . Not on  file   Social History Narrative   Pt lives in [place] with spouse.  2 grown children.   Retired Tax inspector.    Family History  Problem Relation Age of Onset  . Colon cancer Maternal Uncle   . Colon cancer Maternal Grandmother   . Diabetes Brother   . Heart failure Mother   . Lung cancer Father     Physical Exam: Filed Vitals:   06/06/13 0932  BP: 140/72  Pulse: 63  Height: 5\' 3"  (1.6 m)  Weight: 195 lb (88.451 kg)    GEN- The patient is well appearing, alert and oriented x 3 today.   Head- normocephalic, atraumatic Eyes-  Sclera clear, conjunctiva pink Ears- hearing intact Oropharynx- clear Neck- supple, no JVP Lymph- no cervical lymphadenopathy Lungs- Clear to ausculation bilaterally, normal work of breathing Heart- Regular rate and rhythm, no murmurs, rubs or gallops, PMI not laterally displaced GI- soft, NT, ND, + BS Extremities- no clubbing, cyanosis, or edema MS- no significant deformity or atrophy Skin- no rash or lesion Psych- euthymic mood, full affect Neuro- strength and sensation are intact  ekg today reveals sinus rhythm 63 bpm, otherwise normal ekg  Assessment and Plan:  1. afib Doing well s/p ablation without sustained recurrence off of AAD Continue coumadin long term Decrease metoprolol to 12.5mg  BID  2. HTN Stable No change required today  Return to see Lawson Fiscal in 6 months I will see in a year

## 2013-06-06 NOTE — Patient Instructions (Addendum)
Your physician wants you to follow-up in:  6 MONTHS WITH LORI AND   YEAR WITH DR Jacquiline Doe will receive a reminder letter in the mail two months in advance. If you don't receive a letter, please call our office to schedule the follow-up appointment. Your physician has recommended you make the following change in your medication: DECREASE  METOPROLOL TO 12.5 MG  TWICE DAILY

## 2013-06-21 ENCOUNTER — Telehealth: Payer: Self-pay | Admitting: Internal Medicine

## 2013-06-21 NOTE — Telephone Encounter (Signed)
New Prob  [place]  She wanted to make you aware.

## 2013-06-21 NOTE — Telephone Encounter (Signed)
Left message for patient to return my call.

## 2013-06-22 NOTE — Telephone Encounter (Signed)
Follow Up     Pt calling returning your call.

## 2013-06-22 NOTE — Telephone Encounter (Signed)
Episode on Sun night lasted 1-1.5 hours started about 12:30am.  She says it is not racing just felt like it was "beating in 3 different places".  She took 3 NTG(her husbands) to see if this would help.  I let her know that NTG is not indicated for afib and that she should not take medications that are not prescribed to her.  She takes Metoprolol 12.5mg  twice daily.  If she has another episode she should take Metoprolol to see if this helps.  She feels okay now and has not had another episode since.  She will call back if more problems.  I let here know I will discuss with Dr Johney Frame upon his return and call her back if he has further recommendations

## 2013-09-07 ENCOUNTER — Other Ambulatory Visit: Payer: Self-pay

## 2013-09-07 DIAGNOSIS — Z1231 Encounter for screening mammogram for malignant neoplasm of breast: Secondary | ICD-10-CM

## 2013-10-11 ENCOUNTER — Ambulatory Visit: Payer: Medicare Other

## 2013-10-21 ENCOUNTER — Ambulatory Visit
Admission: RE | Admit: 2013-10-21 | Discharge: 2013-10-21 | Disposition: A | Discharge status: Home / Self Care | Payer: Medicare Other | Source: Ambulatory Visit

## 2013-10-21 DIAGNOSIS — Z1231 Encounter for screening mammogram for malignant neoplasm of breast: Secondary | ICD-10-CM

## 2013-10-25 ENCOUNTER — Other Ambulatory Visit: Payer: Self-pay | Admitting: Family Medicine

## 2013-10-25 DIAGNOSIS — R928 Other abnormal and inconclusive findings on diagnostic imaging of breast: Secondary | ICD-10-CM

## 2013-11-01 ENCOUNTER — Ambulatory Visit
Admission: RE | Admit: 2013-11-01 | Discharge: 2013-11-01 | Disposition: A | Discharge status: Home / Self Care | Payer: Medicare Other | Source: Ambulatory Visit | Attending: Family Medicine | Admitting: Family Medicine

## 2013-11-01 DIAGNOSIS — R928 Other abnormal and inconclusive findings on diagnostic imaging of breast: Secondary | ICD-10-CM

## 2013-11-21 ENCOUNTER — Other Ambulatory Visit: Payer: Self-pay | Admitting: Nurse Practitioner

## 2013-12-13 ENCOUNTER — Other Ambulatory Visit: Payer: Self-pay

## 2013-12-13 MED ORDER — HYDROCHLOROTHIAZIDE 25 MG PO TABS: ORAL_TABLET | ORAL | Status: DC

## 2013-12-19 ENCOUNTER — Other Ambulatory Visit: Payer: Self-pay

## 2013-12-19 MED ORDER — METOPROLOL TARTRATE 25 MG PO TABS: 25.0000 mg | ORAL_TABLET | Freq: Two times a day (BID) | ORAL | Status: DC

## 2014-03-27 ENCOUNTER — Ambulatory Visit (INDEPENDENT_AMBULATORY_CARE_PROVIDER_SITE_OTHER): Payer: Commercial Managed Care - HMO | Admitting: Nurse Practitioner

## 2014-03-27 ENCOUNTER — Encounter: Payer: Self-pay | Admitting: Nurse Practitioner

## 2014-03-27 VITALS — BP 149/73 | HR 56 | Ht 63.0 in | Wt 196.0 lb

## 2014-03-27 DIAGNOSIS — I4891 Unspecified atrial fibrillation: Secondary | ICD-10-CM

## 2014-03-27 DIAGNOSIS — R079 Chest pain, unspecified: Secondary | ICD-10-CM

## 2014-03-27 DIAGNOSIS — I10 Essential (primary) hypertension: Secondary | ICD-10-CM

## 2014-03-27 DIAGNOSIS — I480 Paroxysmal atrial fibrillation: Secondary | ICD-10-CM

## 2014-03-27 NOTE — Progress Notes (Signed)
Julie Gill Date of Birth: 09/24/1941 Medical Record #[id number]  History of Present Illness: Ms. Julie Gill is seen back today for a 9 month check. Seen for Julie. Rayann Gill. She has no known CAD. Does have PAF with prior ablation. Committed to long term coumadin. Other issues include GERD, HLD, HTN, mild sleep apnea, bradycardia, restless legs, anxiety and obesity. She had a negative Myoview last December 2012. Last echo in 2013.   Seen by me back in November of 2013 with chest pain with only increasing the speed on the treadmill. She had no chest pain with biking or using the stepper or with doing her housework. We elected a watch and wait approach and her symptoms abated. Elevated BP was treated.   Last seen by Julie. Rayann Gill back in July of 2014. Metoprolol was cut back. She was felt to be doing well.   Comes back today. Here alone. She has been doing ok. Has had 3 brief spells of atrial fib - nothing that bothered her. Some "twinges" of chest pain that is very fleeting. She goes to the gym 3 times a week and will have chest pressure/tightness still with walking on the treadmill. BP has been ok at home - tends to be higher here.   Current Outpatient Prescriptions  Medication Sig Dispense Refill  . calcium carbonate (OS-CAL) 600 MG TABS Take 300-600 mg by mouth 2 (two) times daily. Takes 1 tablet in the morning and 1/2 tablet at night      . Cholecalciferol (VITAMIN D PO) Take 4,000 Units by mouth daily.        . clonazePAM (KLONOPIN) 0.5 MG tablet Take 0.25 mg by mouth 2 (two) times daily as needed. For anxiety      . fish oil-omega-3 fatty acids 1000 MG capsule Take 2 g by mouth 2 (two) times daily.       . hydrochlorothiazide (HYDRODIURIL) 25 MG tablet TAKE 1/2 TABLET BY MOUTH DAILY  45 tablet  3  . lisinopril (PRINIVIL,ZESTRIL) 40 MG tablet Take 1 tablet (40 mg total) by mouth daily.  30 tablet  6  . lovastatin (MEVACOR) 40 MG tablet Take 40 mg by mouth at bedtime.       . Magnesium 200 MG TABS  Take 200 mg by mouth daily.       . metoprolol tartrate (LOPRESSOR) 25 MG tablet Take 12.5 mg by mouth 2 (two) times daily.      . Multiple Vitamins-Minerals (MULTIVITAMIN WITH MINERALS) tablet Take 1 tablet by mouth daily.        . nitroGLYCERIN (NITROSTAT) 0.4 MG SL tablet Place 1 tablet (0.4 mg total) under the tongue every 5 (five) minutes as needed for chest pain.  25 tablet  3  . polyethylene glycol (MIRALAX / GLYCOLAX) packet Take 17 g by mouth daily.      . potassium chloride SA (K-DUR,KLOR-CON) 20 MEQ tablet Take 20 mEq by mouth daily.      . vitamin E (VITAMIN E) 400 UNIT capsule Take 800 Units by mouth daily.      . pantoprazole (PROTONIX) 40 MG tablet Take 1 tablet (40 mg total) by mouth daily.  30 tablet  11  . warfarin (COUMADIN) 5 MG tablet Take 5-7.5 mg by mouth See admin instructions. Takes 1 tablet (5mg ) on Sunday, and 1.5 tablets (7.5mg ) Monday-Saturday      . [DISCONTINUED] ranitidine (ZANTAC) 150 MG capsule Take 150 mg by mouth daily.       No current facility-administered  medications for this visit.    Allergies  Allergen Reactions  . Codeine Other (See Comments)    Skin crawls  . Prednisone Other (See Comments)    tachycardia    Past Medical History  Diagnosis Date  . GERD (gastroesophageal reflux disease)   . Hyperlipidemia   . Hypertension   . Atrial fibrillation     CHADS VASC score 3. Intolerant to flecainide. s/p afib ablation 02/12/12  . Mild sleep apnea   . Sinus bradycardia   . Restless leg syndrome   . Anxiety   . Arthritis   . Obesity   . Sleep apnea   . Neuromuscular disorder     Past Surgical History  Procedure Laterality Date  . Abdominal hysterectomy    . Tonsillectomy and adenoidectomy    . Bladder suspension    . Colonoscopy w/ polypectomy  1995, [id number], 2007, 05/06/2011    adenomas  . Appendectomy    . Varicose vein surgery    . Tee without cardioversion  02/11/2012    Procedure: TRANSESOPHAGEAL ECHOCARDIOGRAM (TEE);  Surgeon:  Julie Dresser, MD;  Location: [facility] ENDOSCOPY;  Service: Cardiovascular;  Laterality: N/A;  . Atrial fibrillation ablation  02/12/12    PVI by Julie Gill    History  Smoking status  . Never Smoker   Smokeless tobacco  . Never Used    History  Alcohol Use No    Family History  Problem Relation Age of Onset  . Colon cancer Maternal Uncle   . Colon cancer Maternal Grandmother   . Diabetes Brother   . Heart failure Mother   . Lung cancer Father     Review of Systems: The review of systems is per the HPI.  All other systems were reviewed and are negative.  Physical Exam: BP 149/73  Pulse 56  Ht 5\' 3"  (1.6 m)  Wt 196 lb (88.905 kg)  BMI 34.73 kg/m2 BP is 138/80 by me.  Patient is very pleasant and in no acute distress. She is obese. Skin is warm and dry. Color is normal.  HEENT is unremarkable. Normocephalic/atraumatic. PERRL. Sclera are nonicteric. Neck is supple. No masses. No JVD. Lungs are clear. Cardiac exam shows a regular rate and rhythm. Abdomen is soft. Extremities are without edema. Gait and ROM are intact. No gross neurologic deficits noted.  Wt Readings from Last 3 Encounters:  03/27/14 196 lb (88.905 kg)  06/06/13 195 lb (88.451 kg)  12/07/12 194 lb (87.998 kg)    LABORATORY DATA: EKG today shows sinus bradycardia.    Lab Results  Component Value Date   WBC 5.6 02/05/2012   HGB 11.9* 02/05/2012   HCT 36.4 02/05/2012   PLT 240.0 02/05/2012   GLUCOSE 99 12/07/2012   NA 140 12/07/2012   K 4.1 12/07/2012   CL 104 12/07/2012   CREATININE 0.9 12/07/2012   BUN 16 12/07/2012   CO2 33* 12/07/2012   INR 2.71* 02/13/2012     Assessment / Plan: 1. PAF - intolerant to flecainide and has had prior ablation - doing very well with very little recurrence.   2. HTN - BP better by me. I have left her on her current regimen. She will continue to monitor at home.   3. Chronic coumadin therapy - followed by her PCP with her routine labs.  4. Chest pain - will arrange for GXT.    Tentatively see her back as planned later this summer unless GXT is abnormal.   Patient is agreeable to  this plan and will call if any problems develop in the interim.   Julie Junes, RN, [place] [address] [place], [place]  [address] [phone]

## 2014-03-27 NOTE — Patient Instructions (Addendum)
Stay on your current medicines  We will arrange for a treadmill test (GXT)  See Dr. Rayann Heman later this summer   Call the [place] office at [phone] if you have any questions, problems or concerns.

## 2014-04-17 ENCOUNTER — Encounter: Payer: Self-pay | Admitting: Nurse Practitioner

## 2014-04-17 ENCOUNTER — Ambulatory Visit (INDEPENDENT_AMBULATORY_CARE_PROVIDER_SITE_OTHER): Payer: Commercial Managed Care - HMO | Admitting: Nurse Practitioner

## 2014-04-17 VITALS — BP 170/83 | HR 82

## 2014-04-17 DIAGNOSIS — I10 Essential (primary) hypertension: Secondary | ICD-10-CM

## 2014-04-17 DIAGNOSIS — I480 Paroxysmal atrial fibrillation: Secondary | ICD-10-CM

## 2014-04-17 DIAGNOSIS — R079 Chest pain, unspecified: Secondary | ICD-10-CM

## 2014-04-17 DIAGNOSIS — I4891 Unspecified atrial fibrillation: Secondary | ICD-10-CM

## 2014-04-17 NOTE — Progress Notes (Signed)
Exercise Treadmill Test  Pre-Exercise Testing Evaluation Rhythm: normal sinus  Rate: 77     Test  Exercise Tolerance Test Ordering MD: Thompson Grayer, MD  Interpreting MD: [name], NP  Unique Test No: 1  Treadmill:  1  Indication for ETT: chest pain - rule out ischemia  Contraindication to ETT: No   Stress Modality: exercise - treadmill  Cardiac Imaging Performed: non   Protocol: standard Bruce - maximal  Max BP:  204/74  Max MPHR (bpm):  150 85% MPR (bpm):  128  MPHR obtained (bpm):  148 % MPHR obtained:  97%  Reached 85% MPHR (min:sec):  4:37 Total Exercise Time (min-sec):  6:30  Workload in METS:  7.7 Borg Scale: 15  Reason ETT Terminated:  patient's desire to stop    ST Segment Analysis At Rest: normal ST segments - no evidence of significant ST depression With Exercise: no evidence of significant ST depression  Other Information Arrhythmia:  Yes Angina during ETT:  present (1) Quality of ETT:  diagnostic  ETT Interpretation:  normal - no evidence of ischemia by ST analysis  Comments: Patient presents today for routine GXT. Has had chest pain - no known CAD. Has HTN, HLD and obesity.  She held 2 doses of her beta blocker prior to this study.  Today the patient exercised on the standard Bruce protocol for a total of 6:30 minutes.  Reduced exercise tolerance.  Adequate blood pressure response.  Clinically negative for chest pain. Test was stopped due to fatigue/reported very mild chest pain that resolved quickly in recovery.  EKG negative for ischemia. Noted to have occasional PACs and PVCs. More PVC's noted at peak of exercise.   Recommendations: CV risk factor modification.  See back as planned in July.  Resume beta blocker.   Patient is agreeable to this plan and will call if any problems develop in the interim.   Burtis Junes, RN, [place] [address] [place], [place]  [phone]

## 2014-06-07 ENCOUNTER — Ambulatory Visit (INDEPENDENT_AMBULATORY_CARE_PROVIDER_SITE_OTHER): Payer: Commercial Managed Care - HMO | Admitting: Internal Medicine

## 2014-06-07 ENCOUNTER — Encounter: Payer: Self-pay | Admitting: Internal Medicine

## 2014-06-07 VITALS — BP 148/70 | HR 56 | Ht 63.0 in | Wt 197.8 lb

## 2014-06-07 DIAGNOSIS — I10 Essential (primary) hypertension: Secondary | ICD-10-CM

## 2014-06-07 DIAGNOSIS — I480 Paroxysmal atrial fibrillation: Secondary | ICD-10-CM

## 2014-06-07 DIAGNOSIS — I4891 Unspecified atrial fibrillation: Secondary | ICD-10-CM

## 2014-06-07 NOTE — Patient Instructions (Signed)
Your physician wants you to follow-up in: [name] CRNP in 6 months.   You will receive a reminder letter in the mail two months in advance. If you don't receive a letter, please call our office to schedule the follow-up appointment. Your physician wants you to follow-up in: 1 year with Dr. Rayann Heman.   You will receive a reminder letter in the mail two months in advance. If you don't receive a letter, please call our office to schedule the follow-up appointment.

## 2014-06-07 NOTE — Progress Notes (Signed)
PCP: MCNEILL,[name], MD  Julie Gill is a 73 y.o. female who presents today for routine electrophysiology followup.  Since last being seen in our clinic, the patient reports doing very well.  She had a sinus infection in June and had about 15 minutes of palpitations at that time.  She is unaware of any other afib.  Today, she denies symptoms of palpitations, chest pain, shortness of breath,  lower extremity edema, dizziness, presyncope, or syncope.  The patient is otherwise without complaint today.   Past Medical History  Diagnosis Date  . GERD (gastroesophageal reflux disease)   . Hyperlipidemia   . Hypertension   . Atrial fibrillation     CHADS VASC score 3. Intolerant to flecainide. s/p afib ablation 02/12/12  . Mild sleep apnea   . Sinus bradycardia   . Restless leg syndrome   . Anxiety   . Arthritis   . Obesity   . Sleep apnea   . Neuromuscular disorder    Past Surgical History  Procedure Laterality Date  . Abdominal hysterectomy    . Tonsillectomy and adenoidectomy    . Bladder suspension    . Colonoscopy w/ polypectomy  1995, [id number], 2007, 05/06/2011    adenomas  . Appendectomy    . Varicose vein surgery    . Tee without cardioversion  02/11/2012    Procedure: TRANSESOPHAGEAL ECHOCARDIOGRAM (TEE);  Surgeon: Larey Dresser, MD;  Location: [facility] ENDOSCOPY;  Service: Cardiovascular;  Laterality: N/A;  . Atrial fibrillation ablation  02/12/12    PVI by Dr Rayann Heman    Current Outpatient Prescriptions  Medication Sig Dispense Refill  . calcium carbonate (OS-CAL) 600 MG TABS Take 300 mg by mouth 2 (two) times daily.       . Cholecalciferol (VITAMIN D PO) Take 4,000 Units by mouth daily.        . clonazePAM (KLONOPIN) 0.5 MG tablet Take 0.25 mg by mouth 2 (two) times daily as needed. For anxiety      . fish oil-omega-3 fatty acids 1000 MG capsule Take 2 g by mouth 2 (two) times daily.       . hydrochlorothiazide (HYDRODIURIL) 25 MG tablet TAKE 1/2 TABLET BY MOUTH DAILY  45 tablet   3  . lisinopril (PRINIVIL,ZESTRIL) 40 MG tablet Take 1 tablet (40 mg total) by mouth daily.  30 tablet  6  . lovastatin (MEVACOR) 40 MG tablet Take 40 mg by mouth at bedtime.       . Magnesium 200 MG TABS Take 200 mg by mouth daily.       . metoprolol tartrate (LOPRESSOR) 25 MG tablet Take 12.5 mg by mouth 2 (two) times daily.      . Multiple Vitamins-Minerals (MULTIVITAMIN WITH MINERALS) tablet Take 1 tablet by mouth daily.        . nitroGLYCERIN (NITROSTAT) 0.4 MG SL tablet Place 1 tablet (0.4 mg total) under the tongue every 5 (five) minutes as needed for chest pain.  25 tablet  3  . pantoprazole (PROTONIX) 40 MG tablet Take 1 tablet (40 mg total) by mouth daily.  30 tablet  11  . potassium chloride SA (K-DUR,KLOR-CON) 20 MEQ tablet Take 20 mEq by mouth daily.      Marland Kitchen senna (SENOKOT) 8.6 MG tablet Take 1-2 tablets by mouth daily.      . vitamin E (VITAMIN E) 400 UNIT capsule Take 800 Units by mouth daily.      Marland Kitchen warfarin (COUMADIN) 5 MG tablet Take 5-7.5 mg by mouth  See admin instructions.       . [DISCONTINUED] ranitidine (ZANTAC) 150 MG capsule Take 150 mg by mouth daily.       No current facility-administered medications for this visit.   ROS- all systems reviewed and negative except as per HPI  Physical Exam: Filed Vitals:   06/07/14 1204  BP: 148/70  Pulse: 56  Height: 5\' 3"  (1.6 m)  Weight: 197 lb 12.8 oz (89.721 kg)    GEN- The patient is overweight appearing, alert and oriented x 3 today.   Head- normocephalic, atraumatic Eyes-  Sclera clear, conjunctiva pink Ears- hearing intact Oropharynx- clear Lungs- Clear to ausculation bilaterally, normal work of breathing Heart- Regular rate and rhythm, no murmurs, rubs or gallops, PMI not laterally displaced GI- soft, NT, ND, + BS Extremities- no clubbing, cyanosis, or edema  ekg today reveals sinus bradycardia 56 bpm, otherwise normal ekg  Assessment and Plan:  1. afib Well controlled post ablation off of AAD  therapy chads2vasc score is at least 3.  Continue anticoagulation. She is clear that she prefers coumadin over NOAC therapy due to costs  2. htn Stable No change required today  3. Obesity Weight loss and regular exercise are encouraged  Follow-up with Cecille Rubin in 6 months I will see in a year

## 2014-08-29 ENCOUNTER — Other Ambulatory Visit: Payer: Self-pay | Admitting: *Deleted

## 2014-08-29 MED ORDER — HYDROCHLOROTHIAZIDE 25 MG PO TABS: ORAL_TABLET | ORAL | Status: DC

## 2014-09-26 ENCOUNTER — Other Ambulatory Visit: Payer: Self-pay

## 2014-09-26 DIAGNOSIS — Z1239 Encounter for other screening for malignant neoplasm of breast: Secondary | ICD-10-CM

## 2014-11-02 ENCOUNTER — Ambulatory Visit
Admission: RE | Admit: 2014-11-02 | Discharge: 2014-11-02 | Disposition: A | Discharge status: Home / Self Care | Payer: Commercial Managed Care - HMO | Source: Ambulatory Visit

## 2014-11-02 ENCOUNTER — Other Ambulatory Visit: Payer: Self-pay

## 2014-11-02 DIAGNOSIS — Z1231 Encounter for screening mammogram for malignant neoplasm of breast: Secondary | ICD-10-CM

## 2014-11-09 ENCOUNTER — Encounter (HOSPITAL_COMMUNITY): Payer: Self-pay | Admitting: Internal Medicine

## 2014-12-07 DIAGNOSIS — J45909 Unspecified asthma, uncomplicated: Secondary | ICD-10-CM | POA: Diagnosis not present

## 2014-12-07 DIAGNOSIS — Z7901 Long term (current) use of anticoagulants: Secondary | ICD-10-CM | POA: Diagnosis not present

## 2014-12-13 ENCOUNTER — Ambulatory Visit: Payer: Commercial Managed Care - HMO | Admitting: Nurse Practitioner

## 2014-12-22 ENCOUNTER — Other Ambulatory Visit: Payer: Self-pay | Admitting: Internal Medicine

## 2015-01-08 DIAGNOSIS — Z7901 Long term (current) use of anticoagulants: Secondary | ICD-10-CM | POA: Diagnosis not present

## 2015-01-08 DIAGNOSIS — I480 Paroxysmal atrial fibrillation: Secondary | ICD-10-CM | POA: Diagnosis not present

## 2015-01-08 DIAGNOSIS — E782 Mixed hyperlipidemia: Secondary | ICD-10-CM | POA: Diagnosis not present

## 2015-01-08 DIAGNOSIS — K219 Gastro-esophageal reflux disease without esophagitis: Secondary | ICD-10-CM | POA: Diagnosis not present

## 2015-01-08 DIAGNOSIS — J309 Allergic rhinitis, unspecified: Secondary | ICD-10-CM | POA: Diagnosis not present

## 2015-01-08 DIAGNOSIS — J45909 Unspecified asthma, uncomplicated: Secondary | ICD-10-CM | POA: Diagnosis not present

## 2015-01-08 DIAGNOSIS — Z23 Encounter for immunization: Secondary | ICD-10-CM | POA: Diagnosis not present

## 2015-01-08 DIAGNOSIS — I10 Essential (primary) hypertension: Secondary | ICD-10-CM | POA: Diagnosis not present

## 2015-01-10 DIAGNOSIS — K219 Gastro-esophageal reflux disease without esophagitis: Secondary | ICD-10-CM | POA: Diagnosis not present

## 2015-01-10 DIAGNOSIS — J309 Allergic rhinitis, unspecified: Secondary | ICD-10-CM | POA: Diagnosis not present

## 2015-01-10 DIAGNOSIS — I480 Paroxysmal atrial fibrillation: Secondary | ICD-10-CM | POA: Diagnosis not present

## 2015-01-10 DIAGNOSIS — G2581 Restless legs syndrome: Secondary | ICD-10-CM | POA: Diagnosis not present

## 2015-01-10 DIAGNOSIS — Z0000 Encounter for general adult medical examination without abnormal findings: Secondary | ICD-10-CM | POA: Diagnosis not present

## 2015-01-10 DIAGNOSIS — I10 Essential (primary) hypertension: Secondary | ICD-10-CM | POA: Diagnosis not present

## 2015-01-10 DIAGNOSIS — E782 Mixed hyperlipidemia: Secondary | ICD-10-CM | POA: Diagnosis not present

## 2015-01-10 DIAGNOSIS — Z7901 Long term (current) use of anticoagulants: Secondary | ICD-10-CM | POA: Diagnosis not present

## 2015-01-23 ENCOUNTER — Encounter: Payer: Self-pay | Admitting: Nurse Practitioner

## 2015-01-23 ENCOUNTER — Ambulatory Visit (INDEPENDENT_AMBULATORY_CARE_PROVIDER_SITE_OTHER): Payer: Commercial Managed Care - HMO | Admitting: Nurse Practitioner

## 2015-01-23 VITALS — BP 148/52 | HR 65 | Ht 63.0 in | Wt 200.4 lb

## 2015-01-23 DIAGNOSIS — I10 Essential (primary) hypertension: Secondary | ICD-10-CM | POA: Diagnosis not present

## 2015-01-23 DIAGNOSIS — I480 Paroxysmal atrial fibrillation: Secondary | ICD-10-CM | POA: Diagnosis not present

## 2015-01-23 NOTE — Patient Instructions (Signed)
Stay on your current medicines  See Dr. Rayann Heman in July  Stay active  Call the [place] office at [phone] if you have any questions, problems or concerns.

## 2015-01-23 NOTE — Progress Notes (Signed)
CARDIOLOGY OFFICE NOTE  Date:  01/23/2015    Julie Gill Date of Birth: 1941-07-05 Medical Record #[id number]  PCP:  [name], MD  Cardiologist:  Allred    Chief Complaint  Patient presents with  . PAF    Follow up visit - seen for Dr. Rayann Heman     History of Present Illness: Julie Gill is a 74 y.o. female who presents today for a follow up visit. Seen for Dr. Rayann Heman. She has no known CAD. Does have PAF with prior ablation. Committed to long term coumadin. Other issues include GERD, HLD, HTN, mild sleep apnea, bradycardia, restless legs, anxiety and obesity. She had a negative Myoview last December 2012. Last echo in 2013. Negative GXT in 2015. Labs are checked by primary care.   Last seen by Dr. Rayann Heman back in July of 2015 - was doing well. I had seen her earlier in the year - some atypical chest pain and we got a GXT - this was fine.   Comes back today. Here alone. She has been doing ok. BP creeping up at home. She has gained weight. Got off track with her exercise - now just getting back in the routine. Tolerating her medicines - not really wanting to take more medicines. Saw PCP earlier this month. Rare palpitation noted. No problems with her coumadin.    Past Medical History  Diagnosis Date  . GERD (gastroesophageal reflux disease)   . Hyperlipidemia   . Hypertension   . Atrial fibrillation     CHADS VASC score 3. Intolerant to flecainide. s/p afib ablation 02/12/12  . Mild sleep apnea   . Sinus bradycardia   . Restless leg syndrome   . Anxiety   . Arthritis   . Obesity   . Sleep apnea   . Neuromuscular disorder     Past Surgical History  Procedure Laterality Date  . Abdominal hysterectomy    . Tonsillectomy and adenoidectomy    . Bladder suspension    . Colonoscopy w/ polypectomy  1995, [id number], 2007, 05/06/2011    adenomas  . Appendectomy    . Varicose vein surgery    . Tee without cardioversion  02/11/2012    Procedure: TRANSESOPHAGEAL  ECHOCARDIOGRAM (TEE);  Surgeon: Larey Dresser, MD;  Location: [facility] ENDOSCOPY;  Service: Cardiovascular;  Laterality: N/A;  . Atrial fibrillation ablation  02/12/12    PVI by Dr Rayann Heman  . Atrial fibrillation ablation N/A 02/12/2012    Procedure: ATRIAL FIBRILLATION ABLATION;  Surgeon: Thompson Grayer, MD;  Location: [facility] CATH LAB;  Service: Cardiovascular;  Laterality: N/A;     Medications: Current Outpatient Prescriptions  Medication Sig Dispense Refill  . calcium carbonate (OS-CAL) 600 MG TABS Take 300 mg by mouth 2 (two) times daily.     . Cholecalciferol (VITAMIN D PO) Take 4,000 Units by mouth daily.      . clonazePAM (KLONOPIN) 0.5 MG tablet Take 0.25 mg by mouth 2 (two) times daily as needed. For anxiety    . fish oil-omega-3 fatty acids 1000 MG capsule Take 2 g by mouth 2 (two) times daily.     . hydrochlorothiazide (HYDRODIURIL) 25 MG tablet TAKE 1/2 TABLET EVERY DAY 45 tablet 0  . lovastatin (MEVACOR) 40 MG tablet Take 40 mg by mouth at bedtime.     . Magnesium 200 MG TABS Take 200 mg by mouth daily.     . Multiple Vitamins-Minerals (MULTIVITAMIN WITH MINERALS) tablet Take 1 tablet by mouth daily.      Marland Kitchen  nitroGLYCERIN (NITROSTAT) 0.4 MG SL tablet Place 1 tablet (0.4 mg total) under the tongue every 5 (five) minutes as needed for chest pain. 25 tablet 3  . potassium chloride SA (K-DUR,KLOR-CON) 20 MEQ tablet Take 20 mEq by mouth daily.    Marland Kitchen senna (SENOKOT) 8.6 MG tablet Take 1-2 tablets by mouth daily.    . vitamin E (VITAMIN E) 400 UNIT capsule Take 800 Units by mouth daily.    Marland Kitchen lisinopril (PRINIVIL,ZESTRIL) 40 MG tablet Take 1 tablet (40 mg total) by mouth daily. 30 tablet 6  . metoprolol tartrate (LOPRESSOR) 25 MG tablet Take 12.5 mg by mouth 2 (two) times daily.    . pantoprazole (PROTONIX) 40 MG tablet Take 1 tablet (40 mg total) by mouth daily. 30 tablet 11  . warfarin (COUMADIN) 5 MG tablet Take 5-7.5 mg by mouth See admin instructions.     . [DISCONTINUED] ranitidine (ZANTAC) 150  MG capsule Take 150 mg by mouth daily.     No current facility-administered medications for this visit.    Allergies: Allergies  Allergen Reactions  . Codeine Other (See Comments)    Skin crawls  . Prednisone Other (See Comments)    tachycardia    Social History: The patient  reports that she has never smoked. She has never used smokeless tobacco. She reports that she does not drink alcohol or use illicit drugs.   Family History: The patient's family history includes Colon cancer in her maternal grandmother and maternal uncle; Diabetes in her brother; Heart failure in her mother; Lung cancer in her father.   Review of Systems: Please see the history of present illness.   Otherwise, the review of systems is positive for DOE, back pain, & wheezing.   All other systems are reviewed and negative.   Physical Exam: VS:  BP 148/52 mmHg  Pulse 65  Ht 5\' 3"  (1.6 m)  Wt 200 lb 6.4 oz (90.901 kg)  BMI 35.51 kg/m2  SpO2 97% .  BMI Body mass index is 35.51 kg/(m^2).  Wt Readings from Last 3 Encounters:  01/23/15 200 lb 6.4 oz (90.901 kg)  06/07/14 197 lb 12.8 oz (89.721 kg)  03/27/14 196 lb (88.905 kg)    General: Pleasant. Well developed, well nourished and in no acute distress. She has gained weight.  HEENT: Normal. Neck: Supple, no JVD, carotid bruits, or masses noted.  Cardiac: Regular rate and rhythm. No murmurs, rubs, or gallops. No edema.  Respiratory:  Lungs are clear to auscultation bilaterally with normal work of breathing.  GI: Soft and nontender.  MS: No deformity or atrophy. Gait and ROM intact. Skin: Warm and dry. Color is normal.  Neuro:  Strength and sensation are intact and no gross focal deficits noted.  Psych: Alert, appropriate and with normal affect.   LABORATORY DATA:  EKG:  EKG is not ordered today.   Lab Results  Component Value Date   WBC 5.6 02/05/2012   HGB 11.9* 02/05/2012   HCT 36.4 02/05/2012   PLT 240.0 02/05/2012   GLUCOSE 99 12/07/2012    NA 140 12/07/2012   K 4.1 12/07/2012   CL 104 12/07/2012   CREATININE 0.9 12/07/2012   BUN 16 12/07/2012   CO2 33* 12/07/2012   INR 2.71* 02/13/2012    BNP (last 3 results) No results for input(s): BNP in the last 8760 hours.  ProBNP (last 3 results) No results for input(s): PROBNP in the last 8760 hours.   Other Studies Reviewed Today:  Echo Study  Conclusions from 2013  - Left ventricle: The cavity size was normal. Wall thickness was normal. Systolic function was normal. The estimated ejection fraction was in the range of 55% to 60%. Wall motion was normal; there were no regional wall motion abnormalities. - Aortic valve: There was no stenosis. - Aorta: Normal caliber with mild plaque in descending thoracic aorta. - Mitral valve: Trivial regurgitation. - Left atrium: The atrium was mildly to moderately dilated. No evidence of thrombus in the atrial cavity or appendage. - Right ventricle: The cavity size was normal. Systolic function was normal. - Right atrium: No evidence of thrombus in the atrial cavity or appendage. - Atrial septum: No defect or patent foramen ovale was identified. Echo contrast study showed no right-to-left atrial level shunt, at baseline or with provocation. Impressions:  - No LA thrombus, may proceed with atrial fibrillation ablation.    GXT Comments from May 2015: Patient presents today for routine GXT. Has had chest pain - no known CAD. Has HTN, HLD and obesity. She held 2 doses of her beta blocker prior to this study.  Today the patient exercised on the standard Bruce protocol for a total of 6:30 minutes.  Reduced exercise tolerance.  Adequate blood pressure response.  Clinically negative for chest pain. Test was stopped due to fatigue/reported very mild chest pain that resolved quickly in recovery.  EKG negative for ischemia. Noted to have occasional PACs and PVCs. More PVC's noted at peak of exercise.     Assessment/Plan: 1. PAF - intolerant to flecainide and has had prior ablation - doing very well with very little recurrence.   2. HTN - She wants to stay on her on her current regimen. She will continue to monitor at home. She understands the need to get back to losing weight and working on diet/exercise issues. She will call if readings do not start to come down with weight loss in the next couple of months.   3. Chronic coumadin therapy - followed by her PCP with her routine labs.  4. Chest pain - resolved  5. Obesity - discussed at length today. She seems motivated to make changes.   Current medicines are reviewed with the patient today.  The patient does not have concerns regarding medicines other than what has been noted above.  The following changes have been made:  See above.  Labs/ tests ordered today include:   No orders of the defined types were placed in this encounter.     Disposition:   FU with Dr. Rayann Heman in 6 months.   Patient is agreeable to this plan and will call if any problems develop in the interim.   Signed: Burtis Junes, RN, ANP-C 01/23/2015 3:17 PM  [facility] [address] [place], [place]  [address] Phone: [phone] Fax: [phone]

## 2015-02-08 DIAGNOSIS — I480 Paroxysmal atrial fibrillation: Secondary | ICD-10-CM | POA: Diagnosis not present

## 2015-02-08 DIAGNOSIS — Z7901 Long term (current) use of anticoagulants: Secondary | ICD-10-CM | POA: Diagnosis not present

## 2015-03-12 DIAGNOSIS — Z7901 Long term (current) use of anticoagulants: Secondary | ICD-10-CM | POA: Diagnosis not present

## 2015-03-12 DIAGNOSIS — I480 Paroxysmal atrial fibrillation: Secondary | ICD-10-CM | POA: Diagnosis not present

## 2015-04-09 DIAGNOSIS — M62838 Other muscle spasm: Secondary | ICD-10-CM | POA: Diagnosis not present

## 2015-04-09 DIAGNOSIS — Z7901 Long term (current) use of anticoagulants: Secondary | ICD-10-CM | POA: Diagnosis not present

## 2015-04-09 DIAGNOSIS — I480 Paroxysmal atrial fibrillation: Secondary | ICD-10-CM | POA: Diagnosis not present

## 2015-04-09 DIAGNOSIS — R0789 Other chest pain: Secondary | ICD-10-CM | POA: Diagnosis not present

## 2015-04-17 ENCOUNTER — Encounter: Payer: Self-pay | Admitting: Gastroenterology

## 2015-05-10 DIAGNOSIS — I480 Paroxysmal atrial fibrillation: Secondary | ICD-10-CM | POA: Diagnosis not present

## 2015-05-10 DIAGNOSIS — Z7901 Long term (current) use of anticoagulants: Secondary | ICD-10-CM | POA: Diagnosis not present

## 2015-05-10 DIAGNOSIS — L821 Other seborrheic keratosis: Secondary | ICD-10-CM | POA: Diagnosis not present

## 2015-06-03 DIAGNOSIS — R600 Localized edema: Secondary | ICD-10-CM | POA: Diagnosis not present

## 2015-06-03 DIAGNOSIS — J0100 Acute maxillary sinusitis, unspecified: Secondary | ICD-10-CM | POA: Diagnosis not present

## 2015-06-05 ENCOUNTER — Telehealth: Payer: Self-pay | Admitting: Internal Medicine

## 2015-06-05 NOTE — Telephone Encounter (Signed)
Called patient at 10:17am  put her on amox-clav  875 mg one twice a day for URI.  Patient wants to make sure there is no interaction with the amox-clav '875mg'$  with the  Other medications she is currently taking.  Gay Filler, pharmacist  At [facility] advised me to let patient know that there would not be any interaction between the medications she is currently taking and the amox-clav 875 mg and she can safely continue medication.  I returned call to patient and let her know she can continue to take her medication and take as prescribed that there was no interaction with her other medications she is currently taking.

## 2015-06-05 NOTE — Telephone Encounter (Signed)
New Message    Pt stopped medication  Amox-clav  '875mg'$  tablet 1 2x a day that her PCP prescribed   Pt wants to inform you that she has been on it since Sunday  Pt wants to talk to RN about medication

## 2015-06-11 ENCOUNTER — Other Ambulatory Visit: Payer: Self-pay

## 2015-06-11 ENCOUNTER — Encounter: Payer: Self-pay | Admitting: Internal Medicine

## 2015-06-11 ENCOUNTER — Ambulatory Visit (INDEPENDENT_AMBULATORY_CARE_PROVIDER_SITE_OTHER): Payer: Commercial Managed Care - HMO | Admitting: Internal Medicine

## 2015-06-11 VITALS — BP 140/78 | HR 55 | Ht 63.0 in | Wt 198.4 lb

## 2015-06-11 DIAGNOSIS — I10 Essential (primary) hypertension: Secondary | ICD-10-CM | POA: Diagnosis not present

## 2015-06-11 DIAGNOSIS — I480 Paroxysmal atrial fibrillation: Secondary | ICD-10-CM | POA: Diagnosis not present

## 2015-06-11 DIAGNOSIS — Z7901 Long term (current) use of anticoagulants: Secondary | ICD-10-CM | POA: Diagnosis not present

## 2015-06-11 NOTE — Progress Notes (Signed)
PCP: MCNEILL,[name], MD  Julie Gill is a 74 y.o. female who presents today for routine electrophysiology followup.  Since last being seen in our clinic, the patient reports doing very well. She is unaware of any other afib.  Today, she denies symptoms of palpitations, chest pain, shortness of breath,  lower extremity edema, dizziness, presyncope, or syncope.  The patient is otherwise without complaint today.   Past Medical History  Diagnosis Date  . GERD (gastroesophageal reflux disease)   . Hyperlipidemia   . Hypertension   . Atrial fibrillation     CHADS VASC score 3. Intolerant to flecainide. s/p afib ablation 02/12/12  . Mild sleep apnea   . Sinus bradycardia   . Restless leg syndrome   . Anxiety   . Arthritis   . Obesity   . Sleep apnea   . Neuromuscular disorder    Past Surgical History  Procedure Laterality Date  . Abdominal hysterectomy    . Tonsillectomy and adenoidectomy    . Bladder suspension    . Colonoscopy w/ polypectomy  1995, [id number], 2007, 05/06/2011    adenomas  . Appendectomy    . Varicose vein surgery    . Tee without cardioversion  02/11/2012    Procedure: TRANSESOPHAGEAL ECHOCARDIOGRAM (TEE);  Surgeon: Larey Dresser, MD;  Location: [facility] ENDOSCOPY;  Service: Cardiovascular;  Laterality: N/A;  . Atrial fibrillation ablation  02/12/12    PVI by Dr Rayann Heman  . Atrial fibrillation ablation N/A 02/12/2012    Procedure: ATRIAL FIBRILLATION ABLATION;  Surgeon: Thompson Grayer, MD;  Location: [facility] CATH LAB;  Service: Cardiovascular;  Laterality: N/A;    Current Outpatient Prescriptions  Medication Sig Dispense Refill  . MAGNESIUM PO Take 250 mg by mouth daily.    Marland Kitchen amoxicillin-clavulanate (AUGMENTIN) 875-125 MG per tablet Take 1 tablet by mouth 2 (two) times daily.    . calcium carbonate (OS-CAL) 600 MG TABS Take 600 mg by mouth daily.     . Cholecalciferol (VITAMIN D PO) Take 4,000 Units by mouth daily.      . clonazePAM (KLONOPIN) 0.5 MG tablet Take 0.25 mg by mouth  2 (two) times daily as needed. For anxiety    . fish oil-omega-3 fatty acids 1000 MG capsule Take 2 g by mouth 2 (two) times daily.     . hydrochlorothiazide (HYDRODIURIL) 25 MG tablet TAKE 1/2 TABLET EVERY DAY (Patient taking differently: TAKE 1/2 TABLET BY MOUTH EVERY DAY) 45 tablet 0  . lisinopril (PRINIVIL,ZESTRIL) 40 MG tablet Take 1 tablet (40 mg total) by mouth daily. 30 tablet 6  . lovastatin (MEVACOR) 40 MG tablet Take 40 mg by mouth at bedtime.     . metoprolol tartrate (LOPRESSOR) 25 MG tablet Take 12.5 mg by mouth 2 (two) times daily.    . Multiple Vitamins-Minerals (MULTIVITAMIN WITH MINERALS) tablet Take 1 tablet by mouth daily.      . nitroGLYCERIN (NITROSTAT) 0.4 MG SL tablet Place 1 tablet (0.4 mg total) under the tongue every 5 (five) minutes as needed for chest pain. 25 tablet 3  . pantoprazole (PROTONIX) 40 MG tablet Take 1 tablet (40 mg total) by mouth daily. 30 tablet 11  . potassium chloride SA (K-DUR,KLOR-CON) 20 MEQ tablet Take 20 mEq by mouth daily.    Marland Kitchen senna (SENOKOT) 8.6 MG tablet Take 1-2 tablets by mouth daily.    . vitamin E (VITAMIN E) 400 UNIT capsule Take 800 Units by mouth daily.    Marland Kitchen warfarin (COUMADIN) 5 MG tablet Take 5-7.5  mg by mouth See admin instructions.     . [DISCONTINUED] ranitidine (ZANTAC) 150 MG capsule Take 150 mg by mouth daily.     No current facility-administered medications for this visit.   ROS- all systems reviewed and negative except as per HPI  Physical Exam: Filed Vitals:   06/11/15 1122  BP: 140/78  Pulse: 55  Height: '5\' 3"'$  (1.6 m)  Weight: 89.994 kg (198 lb 6.4 oz)    GEN- The patient is overweight appearing, alert and oriented x 3 today.   Head- normocephalic, atraumatic Eyes-  Sclera clear, conjunctiva pink Ears- hearing intact Oropharynx- clear Lungs- Clear to ausculation bilaterally, normal work of breathing Heart- Regular rate and rhythm, no murmurs, rubs or gallops, PMI not laterally displaced GI- soft, NT, ND, +  BS Extremities- no clubbing, cyanosis, or edema  ekg today reveals sinus bradycardia 56 bpm, otherwise normal ekg  Assessment and Plan:  1. afib Well controlled post ablation off of AAD therapy chads2vasc score is at least 3.  Continue anticoagulation. She is clear that she prefers coumadin over NOAC therapy due to costs  2. htn Stable No change required today  3. Obesity Weight loss and regular exercise are encouraged  Follow-up with Cecille Rubin in 6 months I will see in a year  Thompson Grayer MD, [facility] 06/11/2015 11:49 AM

## 2015-06-11 NOTE — Patient Instructions (Signed)
Medication Instructions: - no changes  Labwork: - none  Procedures/Testing: - none  Follow-Up: - Your physician wants you to follow-up in: 6 months with [name], NP & 1 year with Dr. Rayann Heman. You will receive a reminder letter in the mail two months in advance. If you don't receive a letter, please call our office to schedule the follow-up appointment.  Any Additional Special Instructions Will Be Listed Below (If Applicable). - none

## 2015-06-26 ENCOUNTER — Other Ambulatory Visit: Payer: Self-pay | Admitting: Internal Medicine

## 2015-06-26 ENCOUNTER — Other Ambulatory Visit: Payer: Self-pay

## 2015-07-12 DIAGNOSIS — I480 Paroxysmal atrial fibrillation: Secondary | ICD-10-CM | POA: Diagnosis not present

## 2015-07-12 DIAGNOSIS — Z7901 Long term (current) use of anticoagulants: Secondary | ICD-10-CM | POA: Diagnosis not present

## 2015-07-24 DIAGNOSIS — H2513 Age-related nuclear cataract, bilateral: Secondary | ICD-10-CM | POA: Diagnosis not present

## 2015-08-13 DIAGNOSIS — Z7901 Long term (current) use of anticoagulants: Secondary | ICD-10-CM | POA: Diagnosis not present

## 2015-08-13 DIAGNOSIS — I480 Paroxysmal atrial fibrillation: Secondary | ICD-10-CM | POA: Diagnosis not present

## 2015-09-12 DIAGNOSIS — Z7901 Long term (current) use of anticoagulants: Secondary | ICD-10-CM | POA: Diagnosis not present

## 2015-09-12 DIAGNOSIS — Z23 Encounter for immunization: Secondary | ICD-10-CM | POA: Diagnosis not present

## 2015-09-12 DIAGNOSIS — I480 Paroxysmal atrial fibrillation: Secondary | ICD-10-CM | POA: Diagnosis not present

## 2015-10-16 DIAGNOSIS — I480 Paroxysmal atrial fibrillation: Secondary | ICD-10-CM | POA: Diagnosis not present

## 2015-10-16 DIAGNOSIS — Z7901 Long term (current) use of anticoagulants: Secondary | ICD-10-CM | POA: Diagnosis not present

## 2015-10-29 ENCOUNTER — Other Ambulatory Visit: Payer: Self-pay

## 2015-10-29 DIAGNOSIS — Z1231 Encounter for screening mammogram for malignant neoplasm of breast: Secondary | ICD-10-CM

## 2015-10-30 DIAGNOSIS — Z7901 Long term (current) use of anticoagulants: Secondary | ICD-10-CM | POA: Diagnosis not present

## 2015-10-30 DIAGNOSIS — I480 Paroxysmal atrial fibrillation: Secondary | ICD-10-CM | POA: Diagnosis not present

## 2015-11-29 DIAGNOSIS — I480 Paroxysmal atrial fibrillation: Secondary | ICD-10-CM | POA: Diagnosis not present

## 2015-11-29 DIAGNOSIS — Z7901 Long term (current) use of anticoagulants: Secondary | ICD-10-CM | POA: Diagnosis not present

## 2015-12-10 ENCOUNTER — Other Ambulatory Visit: Payer: Self-pay

## 2015-12-10 ENCOUNTER — Ambulatory Visit
Admission: RE | Admit: 2015-12-10 | Discharge: 2015-12-10 | Disposition: A | Discharge status: Home / Self Care | Payer: Commercial Managed Care - HMO | Source: Ambulatory Visit

## 2015-12-10 DIAGNOSIS — Z1231 Encounter for screening mammogram for malignant neoplasm of breast: Secondary | ICD-10-CM

## 2015-12-11 ENCOUNTER — Ambulatory Visit (INDEPENDENT_AMBULATORY_CARE_PROVIDER_SITE_OTHER): Payer: Commercial Managed Care - HMO | Admitting: Nurse Practitioner

## 2015-12-11 ENCOUNTER — Encounter: Payer: Self-pay | Admitting: Nurse Practitioner

## 2015-12-11 VITALS — BP 130/72 | HR 60 | Ht 63.0 in | Wt 199.8 lb

## 2015-12-11 DIAGNOSIS — I10 Essential (primary) hypertension: Secondary | ICD-10-CM | POA: Diagnosis not present

## 2015-12-11 DIAGNOSIS — I480 Paroxysmal atrial fibrillation: Secondary | ICD-10-CM

## 2015-12-11 NOTE — Patient Instructions (Addendum)
We will be checking the following labs today - NONE   Medication Instructions:    Continue with your current medicines.     Testing/Procedures To Be Arranged:  N/A  Follow-Up:   See Dr.Allred in 6 months.     Other Special Instructions:   N/A    If you need a refill on your cardiac medications before your next appointment, please call your pharmacy.   Call the [place] office at [phone] if you have any questions, problems or concerns.

## 2015-12-11 NOTE — Progress Notes (Signed)
CARDIOLOGY OFFICE NOTE  Date:  12/11/2015    [name] Date of Birth: [birth year] Medical Record #[id number]  PCP:  [name], MD  Cardiologist:  Allred    Chief Complaint  Patient presents with  . Atrial Fibrillation    6 month check - seen for Dr. Rayann Heman    History of Present Illness: Julie Gill is a 75 y.o. female who presents today for a 6 month check. Seen for Dr. Rayann Heman.   She has no known CAD. Does have PAF with prior ablation. Committed to long term coumadin. Other issues include GERD, HLD, HTN, mild sleep apnea, bradycardia, restless legs, anxiety and obesity. She had a negative Myoview last December 2012. Last echo in 2013. Negative GXT in 2015. Labs are checked by primary care.   Last seen by Dr. Rayann Heman back in July of 2016 - was doing well.    Comes back today. Here alone. Doing ok. Coumadin has been "a little out of whack" and she is on an increased dose. No chest pain. Breathing is ok. Trying to get back to an exercise routine. Weight is stable - hasn't really lost any weight. Not dizzy or lightheaded. Feels like her rhythm has been ok for the most part - says she may have had a "little spell" but nothing that has bothered her. She is happy with how she is doing.   Past Medical History  Diagnosis Date  . GERD (gastroesophageal reflux disease)   . Hyperlipidemia   . Hypertension   . Atrial fibrillation (HCC)     CHADS VASC score 3. Intolerant to flecainide. s/p afib ablation 02/12/12  . Mild sleep apnea   . Sinus bradycardia   . Restless leg syndrome   . Anxiety   . Arthritis   . Obesity   . Sleep apnea   . Neuromuscular disorder [facility])     Past Surgical History  Procedure Laterality Date  . Abdominal hysterectomy    . Tonsillectomy and adenoidectomy    . Bladder suspension    . Colonoscopy w/ polypectomy  1995, [id number], 2007, 05/06/2011    adenomas  . Appendectomy    . Varicose vein surgery    . Tee without cardioversion  02/11/2012   Procedure: TRANSESOPHAGEAL ECHOCARDIOGRAM (TEE);  Surgeon: Larey Dresser, MD;  Location: [facility] ENDOSCOPY;  Service: Cardiovascular;  Laterality: N/A;  . Atrial fibrillation ablation  02/12/12    PVI by Dr Rayann Heman  . Atrial fibrillation ablation N/A 02/12/2012    Procedure: ATRIAL FIBRILLATION ABLATION;  Surgeon: Thompson Grayer, MD;  Location: [facility] CATH LAB;  Service: Cardiovascular;  Laterality: N/A;     Medications: Current Outpatient Prescriptions  Medication Sig Dispense Refill  . calcium carbonate (OS-CAL) 600 MG TABS Take 600 mg by mouth daily.     . Cholecalciferol (VITAMIN D PO) Take 4,000 Units by mouth daily.      . clonazePAM (KLONOPIN) 0.5 MG tablet Take 0.25 mg by mouth 2 (two) times daily as needed. For anxiety    . fish oil-omega-3 fatty acids 1000 MG capsule Take 2 g by mouth 2 (two) times daily.     . hydrochlorothiazide (HYDRODIURIL) 25 MG tablet TAKE 1/2 TABLET EVERY DAY (NEED AN APPT WITH [employer]) 45 tablet 3  . lovastatin (MEVACOR) 40 MG tablet Take 40 mg by mouth at bedtime.     Marland Kitchen MAGNESIUM PO Take 250 mg by mouth daily.    . Multiple Vitamins-Minerals (MULTIVITAMIN WITH MINERALS) tablet Take 1  tablet by mouth daily.      . nitroGLYCERIN (NITROSTAT) 0.4 MG SL tablet Place 1 tablet (0.4 mg total) under the tongue every 5 (five) minutes as needed for chest pain. 25 tablet 3  . potassium chloride SA (K-DUR,KLOR-CON) 20 MEQ tablet Take 20 mEq by mouth daily.    Marland Kitchen senna (SENOKOT) 8.6 MG tablet Take 1-2 tablets by mouth daily.    . vitamin E (VITAMIN E) 400 UNIT capsule Take 800 Units by mouth daily.    Marland Kitchen lisinopril (PRINIVIL,ZESTRIL) 40 MG tablet Take 1 tablet (40 mg total) by mouth daily. 30 tablet 6  . metoprolol tartrate (LOPRESSOR) 25 MG tablet Take 12.5 mg by mouth 2 (two) times daily.    . pantoprazole (PROTONIX) 40 MG tablet Take 1 tablet (40 mg total) by mouth daily. 30 tablet 11  . warfarin (COUMADIN) 5 MG tablet Take 5-7.5 mg by mouth See admin instructions.     .  [DISCONTINUED] ranitidine (ZANTAC) 150 MG capsule Take 150 mg by mouth daily.     No current facility-administered medications for this visit.    Allergies: Allergies  Allergen Reactions  . Codeine Other (See Comments)    Skin crawls  . Prednisone Other (See Comments)    tachycardia    Social History: The patient  reports that she has never smoked. She has never used smokeless tobacco. She reports that she does not drink alcohol or use illicit drugs.   Family History: The patient's family history includes Colon cancer in her maternal grandmother and maternal uncle; Diabetes in her brother; Heart failure in her mother; Lung cancer in her father.   Review of Systems: Please see the history of present illness.   Otherwise, the review of systems is positive for none.   All other systems are reviewed and negative.   Physical Exam: VS:  BP 130/72 mmHg  Pulse 60  Ht '5\' 3"'$  (1.6 m)  Wt 199 lb 12.8 oz (90.629 kg)  BMI 35.40 kg/m2 .  BMI Body mass index is 35.4 kg/(m^2).  Wt Readings from Last 3 Encounters:  12/11/15 199 lb 12.8 oz (90.629 kg)  06/11/15 198 lb 6.4 oz (89.994 kg)  01/23/15 200 lb 6.4 oz (90.901 kg)    General: Pleasant. She is an obese female who is alert and in no acute distress.  HEENT: Normal. Neck: Supple, no JVD, carotid bruits, or masses noted.  Cardiac: Regular rate and rhythm. No murmurs, rubs, or gallops. No edema.  Respiratory:  Lungs are clear to auscultation bilaterally with normal work of breathing.  GI: Soft and nontender.  MS: No deformity or atrophy. Gait and ROM intact. Skin: Warm and dry. Color is normal.  Neuro:  Strength and sensation are intact and no gross focal deficits noted.  Psych: Alert, appropriate and with normal affect.   LABORATORY DATA:  EKG:  EKG is not ordered today.  Lab Results  Component Value Date   WBC 5.6 02/05/2012   HGB 11.9* 02/05/2012   HCT 36.4 02/05/2012   PLT 240.0 02/05/2012   GLUCOSE 99 12/07/2012   NA  140 12/07/2012   K 4.1 12/07/2012   CL 104 12/07/2012   CREATININE 0.9 12/07/2012   BUN 16 12/07/2012   CO2 33* 12/07/2012   INR 2.71* 02/13/2012    BNP (last 3 results) No results for input(s): BNP in the last 8760 hours.  ProBNP (last 3 results) No results for input(s): PROBNP in the last 8760 hours.   Other Studies Reviewed  Today:  Echo Study Conclusions from 2013  - Left ventricle: The cavity size was normal. Wall thickness was normal. Systolic function was normal. The estimated ejection fraction was in the range of 55% to 60%. Wall motion was normal; there were no regional wall motion abnormalities. - Aortic valve: There was no stenosis. - Aorta: Normal caliber with mild plaque in descending thoracic aorta. - Mitral valve: Trivial regurgitation. - Left atrium: The atrium was mildly to moderately dilated. No evidence of thrombus in the atrial cavity or appendage. - Right ventricle: The cavity size was normal. Systolic function was normal. - Right atrium: No evidence of thrombus in the atrial cavity or appendage. - Atrial septum: No defect or patent foramen ovale was identified. Echo contrast study showed no right-to-left atrial level shunt, at baseline or with provocation. Impressions:  - No LA thrombus, may proceed with atrial fibrillation ablation.    GXT Comments from May 2015: Patient presents today for routine GXT. Has had chest pain - no known CAD. Has HTN, HLD and obesity. She held 2 doses of her beta blocker prior to this study.  Today the patient exercised on the standard Bruce protocol for a total of 6:30 minutes.  Reduced exercise tolerance.  Adequate blood pressure response.  Clinically negative for chest pain. Test was stopped due to fatigue/reported very mild chest pain that resolved quickly in recovery.  EKG negative for ischemia. Noted to have occasional PACs and PVCs. More PVC's noted at peak of exercise.     Assessment/Plan: 1. PAF - intolerant to flecainide and has had prior ablation - doing very well with very little recurrence. Remains in sinus by exam today.   2. HTN - BP ok on her current regimen. She monitors at home.   3. Chronic coumadin therapy - followed by her PCP with her routine labs.  4. Obesity - long standing issue. Tried to be encouraging to work on her weight.   Current medicines are reviewed with the patient today.  The patient does not have concerns regarding medicines other than what has been noted above.  The following changes have been made:  See above.  Labs/ tests ordered today include:   No orders of the defined types were placed in this encounter.     Disposition:   FU with Dr. Rayann Heman in 6 months.    Patient is agreeable to this plan and will call if any problems develop in the interim.   Signed: Burtis Junes, RN, ANP-C 12/11/2015 2:43 PM  [facility] [address] [place], [place]  [address] Phone: [phone] Fax: [phone]

## 2016-01-04 DIAGNOSIS — Z7901 Long term (current) use of anticoagulants: Secondary | ICD-10-CM | POA: Diagnosis not present

## 2016-01-04 DIAGNOSIS — I480 Paroxysmal atrial fibrillation: Secondary | ICD-10-CM | POA: Diagnosis not present

## 2016-01-15 DIAGNOSIS — E782 Mixed hyperlipidemia: Secondary | ICD-10-CM | POA: Diagnosis not present

## 2016-01-15 DIAGNOSIS — K219 Gastro-esophageal reflux disease without esophagitis: Secondary | ICD-10-CM | POA: Diagnosis not present

## 2016-01-15 DIAGNOSIS — Z86010 Personal history of colonic polyps: Secondary | ICD-10-CM | POA: Diagnosis not present

## 2016-01-15 DIAGNOSIS — G2581 Restless legs syndrome: Secondary | ICD-10-CM | POA: Diagnosis not present

## 2016-01-15 DIAGNOSIS — Z7901 Long term (current) use of anticoagulants: Secondary | ICD-10-CM | POA: Diagnosis not present

## 2016-01-15 DIAGNOSIS — Z0000 Encounter for general adult medical examination without abnormal findings: Secondary | ICD-10-CM | POA: Diagnosis not present

## 2016-01-15 DIAGNOSIS — I10 Essential (primary) hypertension: Secondary | ICD-10-CM | POA: Diagnosis not present

## 2016-01-15 DIAGNOSIS — I480 Paroxysmal atrial fibrillation: Secondary | ICD-10-CM | POA: Diagnosis not present

## 2016-01-31 DIAGNOSIS — I480 Paroxysmal atrial fibrillation: Secondary | ICD-10-CM | POA: Diagnosis not present

## 2016-01-31 DIAGNOSIS — E782 Mixed hyperlipidemia: Secondary | ICD-10-CM | POA: Diagnosis not present

## 2016-01-31 DIAGNOSIS — Z7901 Long term (current) use of anticoagulants: Secondary | ICD-10-CM | POA: Diagnosis not present

## 2016-01-31 DIAGNOSIS — I10 Essential (primary) hypertension: Secondary | ICD-10-CM | POA: Diagnosis not present

## 2016-02-07 DIAGNOSIS — R05 Cough: Secondary | ICD-10-CM | POA: Diagnosis not present

## 2016-02-15 DIAGNOSIS — Z7901 Long term (current) use of anticoagulants: Secondary | ICD-10-CM | POA: Diagnosis not present

## 2016-03-20 DIAGNOSIS — Z7901 Long term (current) use of anticoagulants: Secondary | ICD-10-CM | POA: Diagnosis not present

## 2016-03-20 DIAGNOSIS — I480 Paroxysmal atrial fibrillation: Secondary | ICD-10-CM | POA: Diagnosis not present

## 2016-03-24 DIAGNOSIS — Z79899 Other long term (current) drug therapy: Secondary | ICD-10-CM | POA: Diagnosis not present

## 2016-03-24 DIAGNOSIS — R0602 Shortness of breath: Secondary | ICD-10-CM | POA: Diagnosis not present

## 2016-03-24 DIAGNOSIS — I480 Paroxysmal atrial fibrillation: Secondary | ICD-10-CM | POA: Diagnosis not present

## 2016-03-24 DIAGNOSIS — I872 Venous insufficiency (chronic) (peripheral): Secondary | ICD-10-CM | POA: Diagnosis not present

## 2016-03-26 DIAGNOSIS — Z7901 Long term (current) use of anticoagulants: Secondary | ICD-10-CM | POA: Diagnosis not present

## 2016-04-02 ENCOUNTER — Ambulatory Visit (INDEPENDENT_AMBULATORY_CARE_PROVIDER_SITE_OTHER): Payer: Commercial Managed Care - HMO | Admitting: Internal Medicine

## 2016-04-02 ENCOUNTER — Ambulatory Visit: Payer: Commercial Managed Care - HMO | Admitting: Internal Medicine

## 2016-04-02 ENCOUNTER — Encounter: Payer: Self-pay | Admitting: Internal Medicine

## 2016-04-02 VITALS — BP 142/64 | HR 60 | Ht 63.0 in | Wt 201.8 lb

## 2016-04-02 DIAGNOSIS — R609 Edema, unspecified: Secondary | ICD-10-CM | POA: Diagnosis not present

## 2016-04-02 DIAGNOSIS — I10 Essential (primary) hypertension: Secondary | ICD-10-CM | POA: Diagnosis not present

## 2016-04-02 DIAGNOSIS — I480 Paroxysmal atrial fibrillation: Secondary | ICD-10-CM

## 2016-04-02 NOTE — Progress Notes (Signed)
PCP: MCNEILL,[name], MD  KAITLEN REDFORD is a 75 y.o. female who presents today for routine electrophysiology followup.  Since last being seen in our clinic, the patient reports doing well. She is unaware of any other afib.  She has rare palpitations which are short.  She did recently had ble edema.  She was evaluated by Dr Addison Lank and is referred to me for follow-up.  She feels that her edema has resolved and attributes this to increased salt intake.  Today, she denies symptoms of  chest pain, shortness of breath, dizziness, presyncope, or syncope.  The patient is otherwise without complaint today.   Past Medical History  Diagnosis Date  . GERD (gastroesophageal reflux disease)   . Hyperlipidemia   . Hypertension   . Atrial fibrillation (HCC)     CHADS VASC score 3. Intolerant to flecainide. s/p afib ablation 02/12/12  . Mild sleep apnea   . Sinus bradycardia   . Restless leg syndrome   . Anxiety   . Arthritis   . Obesity   . Sleep apnea   . Neuromuscular disorder [facility])    Past Surgical History  Procedure Laterality Date  . Abdominal hysterectomy    . Tonsillectomy and adenoidectomy    . Bladder suspension    . Colonoscopy w/ polypectomy  1995, [id number], 2007, 05/06/2011    adenomas  . Appendectomy    . Varicose vein surgery    . Tee without cardioversion  02/11/2012    Procedure: TRANSESOPHAGEAL ECHOCARDIOGRAM (TEE);  Surgeon: Larey Dresser, MD;  Location: [facility] ENDOSCOPY;  Service: Cardiovascular;  Laterality: N/A;  . Atrial fibrillation ablation  02/12/12    PVI by Dr Rayann Heman  . Atrial fibrillation ablation N/A 02/12/2012    Procedure: ATRIAL FIBRILLATION ABLATION;  Surgeon: Thompson Grayer, MD;  Location: [facility] CATH LAB;  Service: Cardiovascular;  Laterality: N/A;    Current Outpatient Prescriptions  Medication Sig Dispense Refill  . calcium carbonate (OS-CAL) 600 MG TABS Take 600 mg by mouth daily.     . Cholecalciferol (VITAMIN D PO) Take 4,000 Units by mouth daily.      . clonazePAM  (KLONOPIN) 0.5 MG tablet Take 0.25 mg by mouth 2 (two) times daily as needed. For anxiety    . fish oil-omega-3 fatty acids 1000 MG capsule Take 2 g by mouth 2 (two) times daily.     . hydrochlorothiazide (HYDRODIURIL) 25 MG tablet TAKE 1/2 TABLET EVERY DAY (NEED AN APPT WITH LORI GERHARDT) 45 tablet 3  . lisinopril (PRINIVIL,ZESTRIL) 40 MG tablet Take 40 mg by mouth daily.    Marland Kitchen lovastatin (MEVACOR) 40 MG tablet Take 40 mg by mouth at bedtime.     Marland Kitchen MAGNESIUM PO Take 250 mg by mouth daily.    . Multiple Vitamins-Minerals (MULTIVITAMIN WITH MINERALS) tablet Take 1 tablet by mouth daily.      . nitroGLYCERIN (NITROSTAT) 0.4 MG SL tablet Place 1 tablet (0.4 mg total) under the tongue every 5 (five) minutes as needed for chest pain. 25 tablet 3  . pantoprazole (PROTONIX) 40 MG tablet Take 1 tablet (40 mg total) by mouth daily. 30 tablet 11  . pantoprazole (PROTONIX) 40 MG tablet Take 40 mg by mouth as directed.    . potassium chloride SA (K-DUR,KLOR-CON) 20 MEQ tablet Take 20 mEq by mouth daily.    Marland Kitchen senna (SENOKOT) 8.6 MG tablet Take 1-2 tablets by mouth daily.    . vitamin E (VITAMIN E) 400 UNIT capsule Take 800 Units by mouth daily.    Marland Kitchen  warfarin (COUMADIN) 5 MG tablet Take 5-7.5 mg by mouth See admin instructions.     . [DISCONTINUED] ranitidine (ZANTAC) 150 MG capsule Take 150 mg by mouth daily.     No current facility-administered medications for this visit.   ROS- all systems reviewed and negative except as per HPI  Physical Exam: Filed Vitals:   04/02/16 1250  BP: 142/64  Pulse: 60  Height: '5\' 3"'$  (1.6 m)  Weight: 201 lb 12.8 oz (91.536 kg)    GEN- The patient is overweight appearing, alert and oriented x 3 today.   Head- normocephalic, atraumatic Eyes-  Sclera clear, conjunctiva pink Ears- hearing intact Oropharynx- clear Lungs- Clear to ausculation bilaterally, normal work of breathing Heart- Regular rate and rhythm, no murmurs, rubs or gallops, PMI not laterally  displaced GI- soft, NT, ND, + BS Extremities- no clubbing, cyanosis, or edema  ekg today reveals sinus rhythm 60 bpm, normal ekg  Assessment and Plan:  1. afib Well controlled post ablation off of AAD therapy chads2vasc score is at least 3. She has recently transitioned from coumadin to xarelto and is pleased with this so far.  2. htn Stable No change required today  3. Obesity Weight loss and regular exercise are encouraged  4. Edema Improved Will obtain an echo to evaluate for structural changes Sodium restriction advised She says that she has had labs with Dr Addison Lank which were normal  Follow-up with Cecille Rubin in 6 months I will see in a year  Thompson Grayer MD, [facility] 04/02/2016 10:41 PM

## 2016-04-02 NOTE — Patient Instructions (Signed)
Medication Instructions:  Your physician recommends that you continue on your current medications as directed. Please refer to the Current Medication list given to you today.   Labwork: None ordered   Testing/Procedures: Your physician has requested that you have an echocardiogram. Echocardiography is a painless test that uses sound waves to create images of your heart. It provides your doctor with information about the size and shape of your heart and how well your heart's chambers and valves are working. This procedure takes approximately one hour. There are no restrictions for this procedure.    Follow-Up: Your physician wants you to follow-up in: 6 months with [name], NP and 12 months with Dr Vallery Ridge will receive a reminder letter in the mail two months in advance. If you don't receive a letter, please call our office to schedule the follow-up appointment.   Any Other Special Instructions Will Be Listed Below (If Applicable).  Low-Sodium Eating Plan Sodium raises blood pressure and causes water to be held in the body. Getting less sodium from food will help lower your blood pressure, reduce any swelling, and protect your heart, liver, and kidneys. We get sodium by adding salt (sodium chloride) to food. Most of our sodium comes from canned, boxed, and frozen foods. Restaurant foods, fast foods, and pizza are also very high in sodium. Even if you take medicine to lower your blood pressure or to reduce fluid in your body, getting less sodium from your food is important. WHAT IS MY PLAN? Most people should limit their sodium intake to 2,300 mg a day. Your health care provider recommends that you limit your sodium intake to 2 grams a day.  WHAT DO I NEED TO KNOW ABOUT THIS EATING PLAN? For the low-sodium eating plan, you will follow these general guidelines:  Choose foods with a % Daily Value for sodium of less than 5% (as listed on the food label).   Use salt-free seasonings or  herbs instead of table salt or sea salt.   Check with your health care provider or pharmacist before using salt substitutes.   Eat fresh foods.  Eat more vegetables and fruits.  Limit canned vegetables. If you do use them, rinse them well to decrease the sodium.   Limit cheese to 1 oz (28 g) per day.   Eat lower-sodium products, often labeled as "lower sodium" or "no salt added."  Avoid foods that contain monosodium glutamate (MSG). MSG is sometimes added to [place] food and some canned foods.  Check food labels (Nutrition Facts labels) on foods to learn how much sodium is in one serving.  Eat more home-cooked food and less restaurant, buffet, and fast food.  When eating at a restaurant, ask that your food be prepared with less salt, or no salt if possible.  HOW DO I READ FOOD LABELS FOR SODIUM INFORMATION? The Nutrition Facts label lists the amount of sodium in one serving of the food. If you eat more than one serving, you must multiply the listed amount of sodium by the number of servings. Food labels may also identify foods as:  Sodium free--Less than 5 mg in a serving.  Very low sodium--35 mg or less in a serving.  Low sodium--140 mg or less in a serving.  Light in sodium--50% less sodium in a serving. For example, if a food that usually has 300 mg of sodium is changed to become light in sodium, it will have 150 mg of sodium.  Reduced sodium--25% less sodium in a  serving. For example, if a food that usually has 400 mg of sodium is changed to reduced sodium, it will have 300 mg of sodium. WHAT FOODS CAN I EAT? Grains Low-sodium cereals, including oats, puffed wheat and rice, and shredded wheat cereals. Low-sodium crackers. Unsalted rice and pasta. Lower-sodium bread.  Vegetables Frozen or fresh vegetables. Low-sodium or reduced-sodium canned vegetables. Low-sodium or reduced-sodium tomato sauce and paste. Low-sodium or reduced-sodium tomato and vegetable juices.    Fruits Fresh, frozen, and canned fruit. Fruit juice.  Meat and Other Protein Products Low-sodium canned tuna and salmon. Fresh or frozen meat, poultry, seafood, and fish. Lamb. Unsalted nuts. Dried beans, peas, and lentils without added salt. Unsalted canned beans. Homemade soups without salt. Eggs.  Dairy Milk. Soy milk. Ricotta cheese. Low-sodium or reduced-sodium cheeses. Yogurt.  Condiments Fresh and dried herbs and spices. Salt-free seasonings. Onion and garlic powders. Low-sodium varieties of mustard and ketchup. Fresh or refrigerated horseradish. Lemon juice.  Fats and Oils Reduced-sodium salad dressings. Unsalted butter.  Other Unsalted popcorn and pretzels.  The items listed above may not be a complete list of recommended foods or beverages. Contact your dietitian for more options. WHAT FOODS ARE NOT RECOMMENDED? Grains Instant hot cereals. Bread stuffing, pancake, and biscuit mixes. Croutons. Seasoned rice or pasta mixes. Noodle soup cups. Boxed or frozen macaroni and cheese. Self-rising flour. Regular salted crackers. Vegetables Regular canned vegetables. Regular canned tomato sauce and paste. Regular tomato and vegetable juices. Frozen vegetables in sauces. Salted [place] fries. Olives. Angie Fava. Relishes. Sauerkraut. Salsa. Meat and Other Protein Products Salted, canned, smoked, spiced, or pickled meats, seafood, or fish. Bacon, ham, sausage, hot dogs, corned beef, chipped beef, and packaged luncheon meats. Salt pork. Jerky. Pickled herring. Anchovies, regular canned tuna, and sardines. Salted nuts. Dairy Processed cheese and cheese spreads. Cheese curds. Blue cheese and cottage cheese. Buttermilk.  Condiments Onion and garlic salt, seasoned salt, table salt, and sea salt. Canned and packaged gravies. Worcestershire sauce. Tartar sauce. Barbecue sauce. Teriyaki sauce. Soy sauce, including reduced sodium. Steak sauce. Fish sauce. Oyster sauce. Cocktail sauce.  Horseradish that you find on the shelf. Regular ketchup and mustard. Meat flavorings and tenderizers. Bouillon cubes. Hot sauce. Tabasco sauce. Marinades. Taco seasonings. Relishes. Fats and Oils Regular salad dressings. Salted butter. Margarine. Ghee. Bacon fat.  Other Potato and tortilla chips. Corn chips and puffs. Salted popcorn and pretzels. Canned or dried soups. Pizza. Frozen entrees and pot pies.  The items listed above may not be a complete list of foods and beverages to avoid. Contact your dietitian for more information.   This information is not intended to replace advice given to you by your health care provider. Make sure you discuss any questions you have with your health care provider.   Document Released: 05/09/2002 Document Revised: 12/08/2014 Document Reviewed: 09/21/2013 Elsevier Interactive Patient Education [employer].      If you need a refill on your cardiac medications before your next appointment, please call your pharmacy.

## 2016-04-10 ENCOUNTER — Other Ambulatory Visit: Payer: Self-pay | Admitting: Internal Medicine

## 2016-04-14 ENCOUNTER — Ambulatory Visit: Payer: Commercial Managed Care - HMO | Admitting: Internal Medicine

## 2016-05-02 ENCOUNTER — Ambulatory Visit: Payer: Commercial Managed Care - HMO | Admitting: Internal Medicine

## 2016-05-28 DIAGNOSIS — R319 Hematuria, unspecified: Secondary | ICD-10-CM | POA: Diagnosis not present

## 2016-05-28 DIAGNOSIS — R079 Chest pain, unspecified: Secondary | ICD-10-CM | POA: Diagnosis not present

## 2016-05-28 DIAGNOSIS — R0789 Other chest pain: Secondary | ICD-10-CM | POA: Diagnosis not present

## 2016-05-28 DIAGNOSIS — M545 Low back pain: Secondary | ICD-10-CM | POA: Diagnosis not present

## 2016-05-28 DIAGNOSIS — R0609 Other forms of dyspnea: Secondary | ICD-10-CM | POA: Diagnosis not present

## 2016-05-28 DIAGNOSIS — R0602 Shortness of breath: Secondary | ICD-10-CM | POA: Diagnosis not present

## 2016-05-31 ENCOUNTER — Emergency Department (HOSPITAL_COMMUNITY): Payer: Commercial Managed Care - HMO

## 2016-05-31 ENCOUNTER — Encounter (HOSPITAL_COMMUNITY): Payer: Self-pay | Admitting: Emergency Medicine

## 2016-05-31 ENCOUNTER — Emergency Department (HOSPITAL_COMMUNITY)
Admission: EM | Admit: 2016-05-31 | Discharge: 2016-05-31 | Disposition: A | Discharge status: Home / Self Care | Payer: Commercial Managed Care - HMO | Attending: Emergency Medicine | Admitting: Emergency Medicine

## 2016-05-31 DIAGNOSIS — I4891 Unspecified atrial fibrillation: Secondary | ICD-10-CM | POA: Insufficient documentation

## 2016-05-31 DIAGNOSIS — Z79899 Other long term (current) drug therapy: Secondary | ICD-10-CM | POA: Insufficient documentation

## 2016-05-31 DIAGNOSIS — M1990 Unspecified osteoarthritis, unspecified site: Secondary | ICD-10-CM | POA: Insufficient documentation

## 2016-05-31 DIAGNOSIS — E785 Hyperlipidemia, unspecified: Secondary | ICD-10-CM | POA: Insufficient documentation

## 2016-05-31 DIAGNOSIS — I10 Essential (primary) hypertension: Secondary | ICD-10-CM | POA: Diagnosis not present

## 2016-05-31 DIAGNOSIS — R109 Unspecified abdominal pain: Secondary | ICD-10-CM | POA: Insufficient documentation

## 2016-05-31 DIAGNOSIS — R319 Hematuria, unspecified: Secondary | ICD-10-CM | POA: Diagnosis not present

## 2016-05-31 LAB — COMPREHENSIVE METABOLIC PANEL
ALT: 37 U/L (ref 14–54)
AST: 43 U/L — ABNORMAL HIGH (ref 15–41)
Albumin: 3.9 g/dL (ref 3.5–5.0)
Alkaline Phosphatase: 67 U/L (ref 38–126)
Anion gap: 7 (ref 5–15)
BUN: 29 mg/dL — ABNORMAL HIGH (ref 6–20)
CO2: 28 mmol/L (ref 22–32)
Calcium: 9.1 mg/dL (ref 8.9–10.3)
Chloride: 103 mmol/L (ref 101–111)
Creatinine, Ser: 1.01 mg/dL — ABNORMAL HIGH (ref 0.44–1.00)
GFR calc Af Amer: >60 mL/min (ref >60)
GFR calc non Af Amer: 54 mL/min — ABNORMAL LOW (ref >60)
Glucose, Bld: 113 mg/dL — ABNORMAL HIGH (ref 65–99)
Potassium: 4.3 mmol/L (ref 3.5–5.1)
Sodium: 138 mmol/L (ref 135–145)
Total Bilirubin: 0.5 mg/dL (ref 0.3–1.2)
Total Protein: 7 g/dL (ref 6.5–8.1)

## 2016-05-31 LAB — URINALYSIS, ROUTINE W REFLEX MICROSCOPIC
Glucose, UA: NEGATIVE mg/dL
Hgb urine dipstick: NEGATIVE
Ketones, ur: NEGATIVE mg/dL
Nitrite: NEGATIVE
Protein, ur: 30 mg/dL — AB
Specific Gravity, Urine: 1.03 (ref 1.005–1.030)
pH: 5 (ref 5.0–8.0)

## 2016-05-31 LAB — CBC WITH DIFFERENTIAL/PLATELET
Basophils Absolute: 0 10*3/uL (ref 0.0–0.1)
Basophils Relative: 0 %
Eosinophils Absolute: 0 10*3/uL (ref 0.0–0.7)
Eosinophils Relative: 1 %
HCT: 37.8 % (ref 36.0–46.0)
Hemoglobin: 12.4 g/dL (ref 12.0–15.0)
Lymphocytes Relative: 40 %
Lymphs Abs: 1.9 10*3/uL (ref 0.7–4.0)
MCH: 30.1 pg (ref 26.0–34.0)
MCHC: 32.8 g/dL (ref 30.0–36.0)
MCV: 91.7 fL (ref 78.0–100.0)
Monocytes Absolute: 0.6 10*3/uL (ref 0.1–1.0)
Monocytes Relative: 12 %
Neutro Abs: 2.3 10*3/uL (ref 1.7–7.7)
Neutrophils Relative %: 47 %
Platelets: 214 10*3/uL (ref 150–400)
RBC: 4.12 MIL/uL (ref 3.87–5.11)
RDW: 13.7 % (ref 11.5–15.5)
WBC: 4.8 10*3/uL (ref 4.0–10.5)

## 2016-05-31 LAB — CK: Total CK: 213 U/L (ref 38–234)

## 2016-05-31 LAB — URINE MICROSCOPIC-ADD ON: RBC / HPF: NONE SEEN RBC/hpf (ref 0–5)

## 2016-05-31 LAB — LIPASE, BLOOD: Lipase: 52 U/L — ABNORMAL HIGH (ref 11–51)

## 2016-05-31 MED ORDER — SODIUM CHLORIDE 0.9 % IV BOLUS (SEPSIS)
1000.0000 mL | Freq: Once | INTRAVENOUS | Status: AC
  Administered 2016-05-31: 1000 mL via INTRAVENOUS

## 2016-05-31 MED ORDER — HYDROMORPHONE HCL 1 MG/ML IJ SOLN
1.0000 mg | Freq: Once | INTRAMUSCULAR | Status: AC
  Administered 2016-05-31: 1 mg via INTRAVENOUS
  Filled 2016-05-31: qty 1

## 2016-05-31 MED ORDER — IBUPROFEN 200 MG PO TABS
600.0000 mg | ORAL_TABLET | Freq: Once | ORAL | Status: AC
  Administered 2016-05-31: 600 mg via ORAL
  Filled 2016-05-31: qty 3

## 2016-05-31 NOTE — ED Notes (Signed)
Patient transported to CT 

## 2016-05-31 NOTE — ED Provider Notes (Signed)
CSN: [id number]     Arrival date & time 05/31/16  1228 History   First MD Initiated Contact with Patient 05/31/16 1245     Chief Complaint  Patient presents with  . Hematuria  . Flank Pain    HPI Comments: 75 year old female presents with hematuria and left flank pain. Past medical history significant for atrial fibrillation currently on Xarelto, hypertension, hyperlipidemia, GERD. Past surgical history significant for appendectomy, hysterectomy, bladder suspension. She reports that for the past 5 days she has been "feeling sick". She reports she had URI symptoms of congestion and malaise. She went to her PCP who prescribed Claritin which helped her feel better. She started to have flank pain and hematuria 2 days ago. She states it is bright red in color; no clots. Reports the pain is colicky, worse with sitting. Nothing has made it better. She is on a blood thinner. Denies fever, chills, chest pain, SOB, abdominal pain, N/V/D, dysuria.   Patient is a 75 y.o. female presenting with hematuria and flank pain.  Hematuria Associated symptoms include nausea. Pertinent negatives include no abdominal pain, chest pain, chills, fever or vomiting.  Flank Pain Associated symptoms include nausea. Pertinent negatives include no abdominal pain, chest pain, chills, fever or vomiting.    Past Medical History  Diagnosis Date  . GERD (gastroesophageal reflux disease)   . Hyperlipidemia   . Hypertension   . Atrial fibrillation (HCC)     CHADS VASC score 3. Intolerant to flecainide. s/p afib ablation 02/12/12  . Mild sleep apnea   . Sinus bradycardia   . Restless leg syndrome   . Anxiety   . Arthritis   . Obesity   . Sleep apnea   . Neuromuscular disorder [facility])    Past Surgical History  Procedure Laterality Date  . Abdominal hysterectomy    . Tonsillectomy and adenoidectomy    . Bladder suspension    . Colonoscopy w/ polypectomy  1995, [id number], 2007, 05/06/2011    adenomas  . Appendectomy    .  Varicose vein surgery    . Tee without cardioversion  02/11/2012    Procedure: TRANSESOPHAGEAL ECHOCARDIOGRAM (TEE);  Surgeon: Larey Dresser, MD;  Location: [facility] ENDOSCOPY;  Service: Cardiovascular;  Laterality: N/A;  . Atrial fibrillation ablation  02/12/12    PVI by Dr Rayann Heman  . Atrial fibrillation ablation N/A 02/12/2012    Procedure: ATRIAL FIBRILLATION ABLATION;  Surgeon: Thompson Grayer, MD;  Location: [facility] CATH LAB;  Service: Cardiovascular;  Laterality: N/A;   Family History  Problem Relation Age of Onset  . Colon cancer Maternal Uncle   . Colon cancer Maternal Grandmother   . Diabetes Brother   . Heart failure Mother   . Lung cancer Father    Social History  Substance Use Topics  . Smoking status: Never Smoker   . Smokeless tobacco: Never Used  . Alcohol Use: No   OB History    No data available     Review of Systems  Constitutional: Negative for fever and chills.  Respiratory: Negative for shortness of breath.   Cardiovascular: Negative for chest pain.  Gastrointestinal: Positive for nausea. Negative for vomiting, abdominal pain and diarrhea.  Genitourinary: Positive for hematuria and flank pain.  All other systems reviewed and are negative.     Allergies  Codeine and Prednisone  Home Medications   Prior to Admission medications   Medication Sig Start Date End Date Taking? Authorizing Provider  calcium carbonate (OS-CAL) 600 MG TABS Take 600 mg by  mouth daily.    Yes Historical Provider, MD  Cholecalciferol (VITAMIN D PO) Take 4,000 Units by mouth daily.     Yes Historical Provider, MD  clonazePAM (KLONOPIN) 0.5 MG tablet Take 0.25 mg by mouth 2 (two) times daily as needed for anxiety.    Yes Historical Provider, MD  fish oil-omega-3 fatty acids 1000 MG capsule Take 1 g by mouth 2 (two) times daily.  11/20/11  Yes Deboraha Sprang, MD  hydrochlorothiazide (HYDRODIURIL) 25 MG tablet TAKE 1/2 TABLET EVERY DAY (NEED AN APPT WITH LORI ERDEYCXK) Patient taking differently:  TAKE 12.5 MG BY MOUTH EVERY DAY (NEED AN APPT WITH LORI GERHARDT) 04/11/16  Yes Thompson Grayer, MD  lisinopril (PRINIVIL,ZESTRIL) 40 MG tablet Take 40 mg by mouth daily.   Yes Historical Provider, MD  lovastatin (MEVACOR) 40 MG tablet Take 40 mg by mouth daily.  09/11/12  Yes Historical Provider, MD  MAGNESIUM PO Take 250 mg by mouth daily.   Yes Historical Provider, MD  Multiple Vitamins-Minerals (MULTIVITAMIN WITH MINERALS) tablet Take 1 tablet by mouth daily.     Yes Historical Provider, MD  nitroGLYCERIN (NITROSTAT) 0.4 MG SL tablet Place 1 tablet (0.4 mg total) under the tongue every 5 (five) minutes as needed for chest pain. 10/11/12  Yes Burtis Junes, NP  pantoprazole (PROTONIX) 40 MG tablet Take 1 tablet (40 mg total) by mouth daily. 03/23/12 05/31/16 Yes Thompson Grayer, MD  potassium chloride SA (K-DUR,KLOR-CON) 20 MEQ tablet Take 20 mEq by mouth daily.   Yes Historical Provider, MD  senna (SENOKOT) 8.6 MG tablet Take 1-2 tablets by mouth daily as needed for constipation.    Yes Historical Provider, MD  VENTOLIN HFA 108 (90 Base) MCG/ACT inhaler Inhale 2 puffs into the lungs every 6 (six) hours as needed for wheezing or shortness of breath.  05/28/16  Yes Historical Provider, MD  vitamin E (VITAMIN E) 400 UNIT capsule Take 800 Units by mouth daily.   Yes Historical Provider, MD  XARELTO 20 MG TABS tablet Take 20 mg by mouth daily. 04/11/16  Yes Historical Provider, MD   BP 132/54 mmHg  Pulse 66  Temp(Src) 98 F (36.7 C) (Oral)  Resp 20  SpO2 97%   Physical Exam  Constitutional: She is oriented to person, place, and time. She appears well-developed and well-nourished. No distress.  HENT:  Head: Normocephalic and atraumatic.  Eyes: Conjunctivae are normal. Pupils are equal, round, and reactive to light. Right eye exhibits no discharge. Left eye exhibits no discharge. No scleral icterus.  Neck: Normal range of motion.  Cardiovascular: Normal rate.  An irregularly irregular rhythm present.   Pulmonary/Chest: Effort normal and breath sounds normal. No respiratory distress. She has no wheezes. She has no rales. She exhibits no tenderness.  Abdominal: Soft. Bowel sounds are normal. She exhibits no distension and no mass. There is tenderness. There is CVA tenderness. There is no rebound and no guarding.  L CVA tenderness; mild epigastric tenderness  Neurological: She is alert and oriented to person, place, and time.  Skin: Skin is warm and dry.  Psychiatric: She has a normal mood and affect. Her behavior is normal.    ED Course  Procedures (including critical care time) Labs Review Labs Reviewed  URINALYSIS, ROUTINE W REFLEX MICROSCOPIC (NOT AT [facility]) - Abnormal; Notable for the following:    Color, Urine ORANGE (*)    APPearance TURBID (*)    Bilirubin Urine SMALL (*)    Protein, ur 30 (*)  Leukocytes, UA LARGE (*)    All other components within normal limits  COMPREHENSIVE METABOLIC PANEL - Abnormal; Notable for the following:    Glucose, Bld 113 (*)    BUN 29 (*)    Creatinine, Ser 1.01 (*)    AST 43 (*)    GFR calc non Af Amer 54 (*)    All other components within normal limits  LIPASE, BLOOD - Abnormal; Notable for the following:    Lipase 52 (*)    All other components within normal limits  URINE MICROSCOPIC-ADD ON - Abnormal; Notable for the following:    Squamous Epithelial / LPF 6-30 (*)    Bacteria, UA MANY (*)    All other components within normal limits  URINE CULTURE  CBC WITH DIFFERENTIAL/PLATELET  CK    Imaging Review Ct Renal Stone Study  05/31/2016  CLINICAL DATA:  Left flank pain hematuria EXAM: CT ABDOMEN AND PELVIS WITHOUT CONTRAST TECHNIQUE: Multidetector CT imaging of the abdomen and pelvis was performed following the standard protocol without IV contrast. COMPARISON:  None. FINDINGS: Lower chest: 12 mm sub solid density right lower lobe, incompletely evaluated. CT chest recommended. No pleural effusion. Hepatobiliary: 12 mm low-density in the  left lobe of the liver probably a cyst. Small calcified granulomata in the right and left lobe of the liver. Gallbladder and bile ducts normal. Pancreas: Negative Spleen: Negative Adrenals/Urinary Tract: Negative for urinary tract calculi. No renal obstruction or mass. Urinary bladder normal. Stomach/Bowel: Stomach and duodenum normal. Negative for bowel obstruction. Mild sigmoid diverticulosis without diverticulitis. Appendectomy. Vascular/Lymphatic: Mild atherosclerotic aorta. Negative for aneurysm. Negative for adenopathy. Reproductive: Hysterectomy.  No pelvic mass. Other: Negative for hernia.  No free fluid. Musculoskeletal: Mild cervical spine degenerative changes. No fracture or mass lesion. IMPRESSION: Negative for urinary tract calculi.  No renal obstruction or mass 12 mm sub solid nodule right lower lobe. This may represent infection or tumor. CT chest recommended for complete evaluation and further recommendations. Electronically Signed   By: Franchot Gallo M.D.   On: 05/31/2016 14:41   I have personally reviewed and evaluated these images and lab results as part of my medical decision-making.   EKG Interpretation None      MDM   Final diagnoses:  Left flank pain   75 year old female presents with flank pain. Unclear etiology. Her UA does not show hgb - it shows "orange" urine. Pt denies new medicines other than Claritin. UA also shows small bilirubin, large leukocytes, and protein. Lipase is mildly elevated. CK is normal. CMP remarkable for mild renal insufficiency. CT renal negative for stone but does comment on incidental findings which were discussed with patient. Urology follow up advised. Patient is NAD, non-toxic, with stable VS. Patient is informed of clinical course, understands medical decision making process, and agrees with plan. Opportunity for questions provided and all questions answered. Return precautions given.    Recardo Evangelist, PA-C 06/01/16 [place],  DO 06/01/16 1056

## 2016-05-31 NOTE — ED Notes (Addendum)
Pt reports hematuria and left flank pain onset Tuesday; evaluated by PCP and negative for UTI. Pt is on blood thinners.

## 2016-06-02 DIAGNOSIS — R911 Solitary pulmonary nodule: Secondary | ICD-10-CM | POA: Diagnosis not present

## 2016-06-02 DIAGNOSIS — R109 Unspecified abdominal pain: Secondary | ICD-10-CM | POA: Diagnosis not present

## 2016-06-02 LAB — URINE CULTURE: Culture: 7000 — AB

## 2016-06-04 ENCOUNTER — Other Ambulatory Visit: Payer: Self-pay | Admitting: Family Medicine

## 2016-06-04 DIAGNOSIS — IMO0001 Reserved for inherently not codable concepts without codable children: Secondary | ICD-10-CM

## 2016-06-04 DIAGNOSIS — R935 Abnormal findings on diagnostic imaging of other abdominal regions, including retroperitoneum: Secondary | ICD-10-CM

## 2016-06-04 DIAGNOSIS — R911 Solitary pulmonary nodule: Secondary | ICD-10-CM

## 2016-06-09 ENCOUNTER — Ambulatory Visit (HOSPITAL_COMMUNITY): Payer: Commercial Managed Care - HMO | Attending: Cardiology

## 2016-06-09 ENCOUNTER — Other Ambulatory Visit: Payer: Self-pay

## 2016-06-09 DIAGNOSIS — Z6835 Body mass index (BMI) 35.0-35.9, adult: Secondary | ICD-10-CM | POA: Insufficient documentation

## 2016-06-09 DIAGNOSIS — I480 Paroxysmal atrial fibrillation: Secondary | ICD-10-CM | POA: Insufficient documentation

## 2016-06-09 DIAGNOSIS — R609 Edema, unspecified: Secondary | ICD-10-CM | POA: Insufficient documentation

## 2016-06-09 DIAGNOSIS — I119 Hypertensive heart disease without heart failure: Secondary | ICD-10-CM | POA: Insufficient documentation

## 2016-06-09 DIAGNOSIS — E785 Hyperlipidemia, unspecified: Secondary | ICD-10-CM | POA: Insufficient documentation

## 2016-06-09 DIAGNOSIS — I059 Rheumatic mitral valve disease, unspecified: Secondary | ICD-10-CM | POA: Diagnosis not present

## 2016-06-09 DIAGNOSIS — G4733 Obstructive sleep apnea (adult) (pediatric): Secondary | ICD-10-CM | POA: Insufficient documentation

## 2016-06-09 DIAGNOSIS — Z8249 Family history of ischemic heart disease and other diseases of the circulatory system: Secondary | ICD-10-CM | POA: Insufficient documentation

## 2016-06-09 DIAGNOSIS — E669 Obesity, unspecified: Secondary | ICD-10-CM | POA: Insufficient documentation

## 2016-06-10 ENCOUNTER — Ambulatory Visit
Admission: RE | Admit: 2016-06-10 | Discharge: 2016-06-10 | Disposition: A | Discharge status: Home / Self Care | Payer: Commercial Managed Care - HMO | Source: Ambulatory Visit | Attending: Family Medicine | Admitting: Family Medicine

## 2016-06-10 DIAGNOSIS — R911 Solitary pulmonary nodule: Secondary | ICD-10-CM

## 2016-06-10 DIAGNOSIS — R918 Other nonspecific abnormal finding of lung field: Secondary | ICD-10-CM | POA: Diagnosis not present

## 2016-06-10 DIAGNOSIS — IMO0001 Reserved for inherently not codable concepts without codable children: Secondary | ICD-10-CM

## 2016-06-10 DIAGNOSIS — R935 Abnormal findings on diagnostic imaging of other abdominal regions, including retroperitoneum: Secondary | ICD-10-CM

## 2016-06-10 MED ORDER — IOPAMIDOL (ISOVUE-300) INJECTION 61%
75.0000 mL | Freq: Once | INTRAVENOUS | Status: AC | PRN
  Administered 2016-06-10: 75 mL via INTRAVENOUS

## 2016-06-11 ENCOUNTER — Ambulatory Visit: Payer: Commercial Managed Care - HMO | Admitting: Internal Medicine

## 2016-06-23 DIAGNOSIS — M545 Low back pain: Secondary | ICD-10-CM | POA: Diagnosis not present

## 2016-06-23 DIAGNOSIS — R0602 Shortness of breath: Secondary | ICD-10-CM | POA: Diagnosis not present

## 2016-06-23 DIAGNOSIS — R319 Hematuria, unspecified: Secondary | ICD-10-CM | POA: Diagnosis not present

## 2016-06-23 DIAGNOSIS — Z79899 Other long term (current) drug therapy: Secondary | ICD-10-CM | POA: Diagnosis not present

## 2016-06-23 DIAGNOSIS — I480 Paroxysmal atrial fibrillation: Secondary | ICD-10-CM | POA: Diagnosis not present

## 2016-06-23 DIAGNOSIS — R079 Chest pain, unspecified: Secondary | ICD-10-CM | POA: Diagnosis not present

## 2016-06-23 DIAGNOSIS — R109 Unspecified abdominal pain: Secondary | ICD-10-CM | POA: Diagnosis not present

## 2016-06-23 DIAGNOSIS — I872 Venous insufficiency (chronic) (peripheral): Secondary | ICD-10-CM | POA: Diagnosis not present

## 2016-06-25 ENCOUNTER — Telehealth: Payer: Self-pay

## 2016-06-25 ENCOUNTER — Encounter: Payer: Self-pay | Admitting: Internal Medicine

## 2016-06-25 ENCOUNTER — Ambulatory Visit (INDEPENDENT_AMBULATORY_CARE_PROVIDER_SITE_OTHER): Payer: Commercial Managed Care - HMO | Admitting: Internal Medicine

## 2016-06-25 VITALS — BP 130/78 | HR 53 | Ht 63.0 in | Wt 202.0 lb

## 2016-06-25 DIAGNOSIS — I482 Chronic atrial fibrillation, unspecified: Secondary | ICD-10-CM

## 2016-06-25 DIAGNOSIS — Z86010 Personal history of colonic polyps: Secondary | ICD-10-CM

## 2016-06-25 DIAGNOSIS — Z800 Family history of malignant neoplasm of digestive organs: Secondary | ICD-10-CM

## 2016-06-25 DIAGNOSIS — Z7901 Long term (current) use of anticoagulants: Secondary | ICD-10-CM

## 2016-06-25 NOTE — Telephone Encounter (Signed)
[facility] [address]  [place] [address]   06/25/2016   RE: Julie Gill DOB: 1941/10/30 MRN: [id number]   Dear Thompson Grayer MD,    We have scheduled the above patient for an endoscopic procedure. Our records show that she is on anticoagulation therapy.   Please advise as to how long the patient may come off her therapy of xarelto prior to the colonoscopy procedure, which is scheduled for 08/29/16.  Please fax back/ or route the completed form to Cathern Tahir [place], [place] at [phone].   Sincerely,    Silvano Rusk, MD, [facility]

## 2016-06-25 NOTE — Progress Notes (Signed)
   Subjective:    Patient ID: Julie Gill, female    DOB: 04/12/1941, 75 y.o.   MRN: [id number] Cc: hx colon polyps and FHx colon cancer HPI No GI complaints today - has hx adenomas and a FHx CRCA. Last colonoscopy 2012 no polyps. Is on Xarelto for Afib - no problems.  Medications, allergies, past medical history, past surgical history, family history and social history are reviewed and updated in the EMR.  Review of Systems No DOE/orthopnea or chest pain. Is having slight bilateral pedal edema and mild left ankle pain today    Objective:   Physical Exam '@BP'$  130/78   Pulse (!) 53   Ht '5\' 3"'$  (1.6 m)   Wt 202 lb (91.6 kg)   BMI 35.78 kg/m @  General:  NAD Eyes:   anicteric Lungs:  clear Heart::  S1S2 no rubs, murmurs or gallops Abdomen:  soft and nontender, BS+ Ext:   Trace bilateral LE  edema, cyanosis or clubbing  Data Reviewed: Prior colonoscopy and pathology reports Recent cardiology note 2017     Assessment & Plan:   Encounter Diagnoses  Name Primary?  Marland Kitchen Hx of adenomatous colonic polyps Yes  . Family history of colon cancer   . Long term current use of anticoagulant - Xarelto   . Chronic atrial fibrillation ([place])     Time for a surveillance colonoscopy. Will hold Xarelto 1-2 days prior to endoscopic procedures - will instruct when and how to resume after procedure. Benefits and risks of procedure explained including risks of bleeding, perforation, infection, missed lesions, reactions to medications and possible need for hospitalization and surgery for complications. Additional rare but real risk of stroke or other vascular clotting events off Xarelto also explained and need to seek urgent help if any signs of these problems occur. Will communicate by phone or EMR with patient's  prescribing provider to confirm that holding Julie Gill is reasonable in this case.   I appreciate the opportunity to care for this patient. RD:EYCXKGY,JEHUD, MD

## 2016-06-25 NOTE — Patient Instructions (Signed)
  You have been scheduled for a colonoscopy. Please follow written instructions given to you at your visit today.  Please pick up your prep supplies at the pharmacy. If you use inhalers (even only as needed), please bring them with you on the day of your procedure. Your physician has requested that you go to www.startemmi.com and enter the access code given to you at your visit today. This web site gives a general overview about your procedure. However, you should still follow specific instructions given to you by our office regarding your preparation for the procedure.    You will be contaced by our office prior to your procedure for directions on holding your xarelto.  If you do not hear from our office 1 week prior to your scheduled procedure, please call [phone] to discuss.   I appreciate the opportunity to care for you. Silvano Rusk, MD, [facility]

## 2016-06-27 NOTE — Telephone Encounter (Signed)
Pt has a CHADS score of 1; CHADSVASc score of 3.  No history of TIA or stroke.  Okay to hold Xarelto x 24 hours prior to colonoscopy.

## 2016-06-27 NOTE — Telephone Encounter (Signed)
Left message for patient to call me back. 

## 2016-06-30 NOTE — Telephone Encounter (Signed)
Patient informed to hold xarelto 24 hours prior to procedure and she verbalized understanding.

## 2016-07-28 DIAGNOSIS — R05 Cough: Secondary | ICD-10-CM | POA: Diagnosis not present

## 2016-07-28 DIAGNOSIS — M545 Low back pain: Secondary | ICD-10-CM | POA: Diagnosis not present

## 2016-07-28 DIAGNOSIS — R079 Chest pain, unspecified: Secondary | ICD-10-CM | POA: Diagnosis not present

## 2016-07-28 DIAGNOSIS — I872 Venous insufficiency (chronic) (peripheral): Secondary | ICD-10-CM | POA: Diagnosis not present

## 2016-07-28 DIAGNOSIS — R109 Unspecified abdominal pain: Secondary | ICD-10-CM | POA: Diagnosis not present

## 2016-07-28 DIAGNOSIS — I480 Paroxysmal atrial fibrillation: Secondary | ICD-10-CM | POA: Diagnosis not present

## 2016-07-28 DIAGNOSIS — R0602 Shortness of breath: Secondary | ICD-10-CM | POA: Diagnosis not present

## 2016-07-28 DIAGNOSIS — E782 Mixed hyperlipidemia: Secondary | ICD-10-CM | POA: Diagnosis not present

## 2016-07-28 DIAGNOSIS — R7301 Impaired fasting glucose: Secondary | ICD-10-CM | POA: Diagnosis not present

## 2016-08-05 DIAGNOSIS — K219 Gastro-esophageal reflux disease without esophagitis: Secondary | ICD-10-CM | POA: Diagnosis not present

## 2016-08-05 DIAGNOSIS — I10 Essential (primary) hypertension: Secondary | ICD-10-CM | POA: Diagnosis not present

## 2016-08-05 DIAGNOSIS — Z23 Encounter for immunization: Secondary | ICD-10-CM | POA: Diagnosis not present

## 2016-08-05 DIAGNOSIS — R7303 Prediabetes: Secondary | ICD-10-CM | POA: Diagnosis not present

## 2016-08-05 DIAGNOSIS — G4730 Sleep apnea, unspecified: Secondary | ICD-10-CM | POA: Diagnosis not present

## 2016-08-05 DIAGNOSIS — Z7901 Long term (current) use of anticoagulants: Secondary | ICD-10-CM | POA: Diagnosis not present

## 2016-08-05 DIAGNOSIS — J309 Allergic rhinitis, unspecified: Secondary | ICD-10-CM | POA: Diagnosis not present

## 2016-08-05 DIAGNOSIS — I480 Paroxysmal atrial fibrillation: Secondary | ICD-10-CM | POA: Diagnosis not present

## 2016-08-05 DIAGNOSIS — E782 Mixed hyperlipidemia: Secondary | ICD-10-CM | POA: Diagnosis not present

## 2016-08-05 DIAGNOSIS — Z79899 Other long term (current) drug therapy: Secondary | ICD-10-CM | POA: Diagnosis not present

## 2016-08-08 ENCOUNTER — Telehealth: Payer: Self-pay | Admitting: Internal Medicine

## 2016-08-08 ENCOUNTER — Other Ambulatory Visit: Payer: Self-pay | Admitting: Internal Medicine

## 2016-08-08 MED ORDER — METOPROLOL TARTRATE 25 MG PO TABS: 12.5000 mg | ORAL_TABLET | Freq: Two times a day (BID) | ORAL | 3 refills | Status: DC

## 2016-08-08 NOTE — Telephone Encounter (Signed)
Please call pt and verify medication and fill accordingly.

## 2016-08-08 NOTE — Telephone Encounter (Signed)
New message      *STAT* If patient is at the pharmacy, call can be transferred to refill team.   1. Which medications need to be refilled? (please list name of each medication and dose if known) metoprolol 25 mg  2. Which pharmacy/location (including street and city if local pharmacy) is medication to be sent to?[facility] @ [address]  3. Do they need a 30 day or 90 day supply? [place]

## 2016-08-08 NOTE — Telephone Encounter (Signed)
called to verify if pt is still taking metoprolol, it was removed from pt list @ last OV with no notation from Dr Rayann Heman or [name] to stop it, verified with pt that she is still currently taking Metoprolol 25 mg, take 12.5 mg PO BID.

## 2016-08-15 ENCOUNTER — Encounter: Payer: Self-pay | Admitting: Internal Medicine

## 2016-08-29 ENCOUNTER — Telehealth: Payer: Self-pay | Admitting: Internal Medicine

## 2016-08-29 ENCOUNTER — Encounter: Payer: Self-pay | Admitting: Internal Medicine

## 2016-08-29 ENCOUNTER — Ambulatory Visit (AMBULATORY_SURGERY_CENTER): Payer: Commercial Managed Care - HMO | Admitting: Internal Medicine

## 2016-08-29 VITALS — BP 115/53 | HR 55 | Temp 99.1°F | Resp 22 | Ht 63.0 in | Wt 202.0 lb

## 2016-08-29 DIAGNOSIS — J45909 Unspecified asthma, uncomplicated: Secondary | ICD-10-CM | POA: Diagnosis not present

## 2016-08-29 DIAGNOSIS — D124 Benign neoplasm of descending colon: Secondary | ICD-10-CM | POA: Diagnosis not present

## 2016-08-29 DIAGNOSIS — Z86010 Personal history of colonic polyps: Secondary | ICD-10-CM | POA: Diagnosis present

## 2016-08-29 DIAGNOSIS — Z1211 Encounter for screening for malignant neoplasm of colon: Secondary | ICD-10-CM | POA: Diagnosis not present

## 2016-08-29 MED ORDER — SODIUM CHLORIDE 0.9 % IV SOLN: 500.0000 mL | INTRAVENOUS | Status: DC

## 2016-08-29 NOTE — Progress Notes (Signed)
A/ox3 pleased with MAC, report to Suzanne RN 

## 2016-08-29 NOTE — Patient Instructions (Addendum)
I found and removed one small polyp.  Probably see about repeat exam in 5 years if you are doing ok re: health.  I appreciate the opportunity to care for you. Gatha Mayer, MD, FACG   YOU HAD AN ENDOSCOPIC PROCEDURE TODAY AT [facility]:   Refer to the procedure report that was given to you for any specific questions about what was found during the examination.  If the procedure report does not answer your questions, please call your gastroenterologist to clarify.  If you requested that your care partner not be given the details of your procedure findings, then the procedure report has been included in a sealed envelope for you to review at your convenience later.  YOU SHOULD EXPECT: Some feelings of bloating in the abdomen. Passage of more gas than usual.  Walking can help get rid of the air that was put into your GI tract during the procedure and reduce the bloating. If you had a lower endoscopy (such as a colonoscopy or flexible sigmoidoscopy) you may notice spotting of blood in your stool or on the toilet paper. If you underwent a bowel prep for your procedure, you may not have a normal bowel movement for a few days.  Please Note:  You might notice some irritation and congestion in your nose or some drainage.  This is from the oxygen used during your procedure.  There is no need for concern and it should clear up in a day or so.  SYMPTOMS TO REPORT IMMEDIATELY:   Following lower endoscopy (colonoscopy or flexible sigmoidoscopy):  Excessive amounts of blood in the stool  Significant tenderness or worsening of abdominal pains  Swelling of the abdomen that is new, acute  Fever of 100F or higher  For urgent or emergent issues, a gastroenterologist can be reached at any hour by calling [phone].   DIET:  We do recommend a small meal at first, but then you may proceed to your regular diet.  Drink plenty of fluids but you should avoid alcoholic beverages for  24 hours.  ACTIVITY:  You should plan to take it easy for the rest of today and you should NOT DRIVE or use heavy machinery until tomorrow (because of the sedation medicines used during the test).    FOLLOW UP: Our staff will call the number listed on your records the next business day following your procedure to check on you and address any questions or concerns that you may have regarding the information given to you following your procedure. If we do not reach you, we will leave a message.  However, if you are feeling well and you are not experiencing any problems, there is no need to return our call.  We will assume that you have returned to your regular daily activities without incident.  If any biopsies were taken you will be contacted by phone or by letter within the next 1-3 weeks.  Please call us at [phone] if you have not heard about the biopsies in 3 weeks.    SIGNATURES/CONFIDENTIALITY: You and/or your care partner have signed paperwork which will be entered into your electronic medical record.  These signatures attest to the fact that that the information above on your After Visit Summary has been reviewed and is understood.  Full responsibility of the confidentiality of this discharge information lies with you and/or your care-partner.  START XARETLO TOMORROW PER DR Ilyana Manuele.  Thank-you for choosing [place] for your medical needs today.

## 2016-08-29 NOTE — Op Note (Signed)
[facility] Patient Name: Julie Gill Procedure Date: 08/29/2016 12:08 PM MRN: [id number] Endoscopist: Gatha Mayer , MD Age: 75 Referring MD:  Date of Birth: Apr 01, 1941 Gender: Female Account #: [id number] Procedure:                Colonoscopy Indications:              High risk colon cancer surveillance: Personal                            history of colonic polyps, Family history of colon                            cancer in multiple second-degree relatives Medicines:                Propofol per Anesthesia, Monitored Anesthesia Care Procedure:                Pre-Anesthesia Assessment:                           - Prior to the procedure, a History and Physical                            was performed, and patient medications and                            allergies were reviewed. The patient's tolerance of                            previous anesthesia was also reviewed. The risks                            and benefits of the procedure and the sedation                            options and risks were discussed with the patient.                            All questions were answered, and informed consent                            was obtained. Prior Anticoagulants: The patient                            last took Xarelto (rivaroxaban) 2 days prior to the                            procedure. ASA Grade Assessment: II - A patient                            with mild systemic disease. After reviewing the                            risks and benefits, the patient was deemed in  satisfactory condition to undergo the procedure.                           After obtaining informed consent, the colonoscope                            was passed under direct vision. Throughout the                            procedure, the patient's blood pressure, pulse, and                            oxygen saturations were monitored continuously. The          EC-[id number] ([id number]) was introduced through the anus                            and advanced to the the cecum, identified by                            appendiceal orifice and ileocecal valve. The                            quality of the bowel preparation was excellent. The                            colonoscopy was performed without difficulty. The                            patient tolerated the procedure well. The bowel                            preparation used was Miralax. The ileocecal valve,                            appendiceal orifice, and rectum were photographed. Scope In: 12:16:59 PM Scope Out: 12:28:49 PM Scope Withdrawal Time: 0 hours 8 minutes 32 seconds  Total Procedure Duration: 0 hours 11 minutes 50 seconds  Findings:                 The perianal and digital rectal examinations were                            normal.                           A 4 mm polyp was found in the descending colon. The                            polyp was sessile. The polyp was removed with a                            cold snare. Resection and retrieval were complete.  Verification of patient identification for the                            specimen was done. Estimated blood loss was minimal.                           The exam was otherwise without abnormality on                            direct and retroflexion views. Complications:            No immediate complications. Estimated blood loss:                            None. Estimated Blood Loss:     Estimated blood loss: none. Impression:               - One 4 mm polyp in the descending colon, removed                            with a cold snare. Resected and retrieved.                           - The examination was otherwise normal on direct                            and retroflexion views. Recommendation:           - Patient has a contact number available for                            emergencies. The  signs and symptoms of potential                            delayed complications were discussed with the                            patient. Return to normal activities tomorrow.                            Written discharge instructions were provided to the                            patient.                           - Resume previous diet.                           - Continue present medications.                           - Repeat colonoscopy is recommended. The                            colonoscopy date will be determined after pathology  results from today's exam become available for                            review.                           - Patient has a contact number available for                            emergencies. The signs and symptoms of potential                            delayed complications were discussed with the                            patient. Return to normal activities tomorrow.                            Written discharge instructions were provided to the                            patient. Gatha Mayer, MD 08/29/2016 12:34:17 PM This report has been signed electronically.

## 2016-08-29 NOTE — Progress Notes (Signed)
Called to room to assist during endoscopic procedure.  Patient ID and intended procedure confirmed with present staff. Received instructions for my participation in the procedure from the performing physician.  

## 2016-08-29 NOTE — Telephone Encounter (Signed)
Called patient , advised that she should follow the orders of her Gastro Dr.

## 2016-08-29 NOTE — Telephone Encounter (Signed)
°  New Prob   Pt had colonoscopy this morning. States she was advised to stop her blood thinner until tomorrow night. Calling to verify this is okay. Please call- OK to leave VM.

## 2016-09-01 ENCOUNTER — Other Ambulatory Visit: Payer: Self-pay | Admitting: Family Medicine

## 2016-09-01 ENCOUNTER — Telehealth: Payer: Self-pay

## 2016-09-01 DIAGNOSIS — R918 Other nonspecific abnormal finding of lung field: Secondary | ICD-10-CM

## 2016-09-01 NOTE — Telephone Encounter (Signed)
  Follow up Call-  Call back number 08/29/2016  Post procedure Call Back phone  # [phone]  Permission to leave phone message Yes  Some recent data might be hidden     Patient questions:  Do you have a fever, pain , or abdominal swelling? No. Pain Score  0 *  Have you tolerated food without any problems? Yes.    Have you been able to return to your normal activities? Yes.    Do you have any questions about your discharge instructions: Diet   No. Medications  No. Follow up visit  No.  Do you have questions or concerns about your Care? No.  Actions: * If pain score is 4 or above: No action needed, pain <4.  No problems per the pt. maw

## 2016-09-09 ENCOUNTER — Other Ambulatory Visit: Payer: Commercial Managed Care - HMO

## 2016-09-09 ENCOUNTER — Ambulatory Visit
Admission: RE | Admit: 2016-09-09 | Discharge: 2016-09-09 | Disposition: A | Discharge status: Home / Self Care | Payer: Commercial Managed Care - HMO | Source: Ambulatory Visit | Attending: Family Medicine | Admitting: Family Medicine

## 2016-09-09 DIAGNOSIS — R918 Other nonspecific abnormal finding of lung field: Secondary | ICD-10-CM

## 2016-09-10 ENCOUNTER — Encounter: Payer: Self-pay | Admitting: Internal Medicine

## 2016-09-10 DIAGNOSIS — Z86010 Personal history of colonic polyps: Secondary | ICD-10-CM

## 2016-09-10 NOTE — Progress Notes (Signed)
Adenoma Recall colon 08/2021

## 2016-09-17 ENCOUNTER — Institutional Professional Consult (permissible substitution) (INDEPENDENT_AMBULATORY_CARE_PROVIDER_SITE_OTHER): Payer: Commercial Managed Care - HMO | Admitting: Thoracic Surgery (Cardiothoracic Vascular Surgery)

## 2016-09-17 ENCOUNTER — Encounter: Payer: Self-pay | Admitting: Thoracic Surgery (Cardiothoracic Vascular Surgery)

## 2016-09-17 VITALS — BP 153/63 | HR 64 | Resp 18 | Ht 63.0 in | Wt 190.0 lb

## 2016-09-17 DIAGNOSIS — R918 Other nonspecific abnormal finding of lung field: Secondary | ICD-10-CM

## 2016-09-17 NOTE — Progress Notes (Signed)
PCP is MCNEILL,[name], MD Referring Provider is [name], MD  Chief Complaint  Patient presents with  . Lung Lesion    multiple...CT CHEST 09/09/16...BILATERAL APICES    HPI: Mrs. Exley is sent for consultation regarding multiple lung nodules.  Mrs. Sayres is a 75 year old woman with a past medical history significant for atrial fibrillation status post ablation, arthritis, gastroesophageal reflux, hyperlipidemia, anxiety, diastolic dysfunction, hyperlipidemia, hypertension, and obesity. Back in July she was having trouble with left flank pain and hematuria. She had a CT to evaluate for kidney stones. She was noted to have a groundglass opacity at the base of the right lower lobe. A CT of the chest was done which showed multiple groundglass opacities bilaterally. The largest was a 2.8 cm nodule in the apex of the right lung. She had a follow-up CT done on October 10 which showed persistence of the nodules. She was also noted to have a 6 mm soft tissue component of the right apical nodule.  She had smoked approximately 5 cigarettes a day for a few years prior to quitting altogether in 1986. She denies cough, but does have wheezing when it is cold. Activities are somewhat limited by arthritis. She had an ablation for atrial fibrillation but remains on Xarelto. She has not had any significant appetite change or weight loss. She denies any unusual headaches or visual changes. She is not restricted in activities by shortness of breath.  Zubrod Score: At the time of surgery this patient's most appropriate activity status/level should be described as: '[x]'$     0    Normal activity, no symptoms '[]'$     1    Restricted in physical strenuous activity but ambulatory, able to do out light work '[]'$     2    Ambulatory and capable of self care, unable to do work activities, up and about >50 % of waking hours                              '[]'$     3    Only limited self care, in bed greater than 50% of waking  hours '[]'$     4    Completely disabled, no self care, confined to bed or chair '[]'$     5    Moribund   Past Medical History:  Diagnosis Date  . Anxiety   . Arthritis   . Atrial fibrillation (HCC)    CHADS VASC score 3. Intolerant to flecainide. s/p afib ablation 02/12/12  . Colon polyps   . Diastolic dysfunction   . GERD (gastroesophageal reflux disease)   . Hyperlipidemia   . Hypertension   . Kidney stones   . Mild sleep apnea   . Mitral regurgitation    mild  . Neuromuscular disorder ([place])   . Obesity   . Plantar fasciitis   . Restless leg syndrome   . Sinus bradycardia   . Sleep apnea     Past Surgical History:  Procedure Laterality Date  . ABDOMINAL HYSTERECTOMY  1992   BSO  . APPENDECTOMY    . atrial fibrillation ablation  02/12/12   PVI by Dr Rayann Heman  . ATRIAL FIBRILLATION ABLATION N/A 02/12/2012   Procedure: ATRIAL FIBRILLATION ABLATION;  Surgeon: Thompson Grayer, MD;  Location: [facility] CATH LAB;  Service: Cardiovascular;  Laterality: N/A;  . BLADDER SUSPENSION    . [place], 200,2004, 2007, 05/06/2011   adenomas  . heels spurs  x 2  . TEE WITHOUT CARDIOVERSION  02/11/2012   Procedure: TRANSESOPHAGEAL ECHOCARDIOGRAM (TEE);  Surgeon: Larey Dresser, MD;  Location: [place];  Service: Cardiovascular;  Laterality: N/A;  . TONSILLECTOMY AND ADENOIDECTOMY    . VARICOSE VEIN SURGERY      Family History  Problem Relation Age of Onset  . Colon cancer Maternal Uncle   . Colon cancer Maternal Grandmother   . Congestive Heart Failure Mother   . Lung cancer Father   . Diabetes Brother   . Hypertension Brother     Social History Social History  Substance Use Topics  . Smoking status: Former Smoker    Types: Cigarettes    Quit date: 09/17/1985  . Smokeless tobacco: Never Used     Comment: 5 CIG /DAY  . Alcohol use No    Current Outpatient Prescriptions  Medication Sig Dispense Refill  . calcium carbonate (OS-CAL) 600 MG TABS Take 600 mg by  mouth daily.     . Cholecalciferol (VITAMIN D PO) Take 4,000 Units by mouth daily.      . clonazePAM (KLONOPIN) 0.5 MG tablet Take 0.25 mg by mouth 2 (two) times daily as needed for anxiety.     . fish oil-omega-3 fatty acids 1000 MG capsule Take 1 g by mouth 2 (two) times daily.     . hydrochlorothiazide (HYDRODIURIL) 25 MG tablet TAKE 1/2 TABLET EVERY DAY (NEED AN APPT WITH LORI GHWEXHBZ) (Patient taking differently: TAKE 12.5 MG BY MOUTH EVERY DAY (NEED AN APPT WITH LORI GERHARDT)) 45 tablet 3  . lisinopril (PRINIVIL,ZESTRIL) 40 MG tablet Take 40 mg by mouth daily.    Marland Kitchen lovastatin (MEVACOR) 40 MG tablet Take 40 mg by mouth daily.     Marland Kitchen MAGNESIUM PO Take 250 mg by mouth daily.    . metoprolol tartrate (LOPRESSOR) 25 MG tablet TAKE 1/2 TABLET(12.5 MG) BY MOUTH TWICE DAILY 90 tablet 2  . Multiple Vitamins-Minerals (MULTIVITAMIN WITH MINERALS) tablet Take 1 tablet by mouth daily.      . nitroGLYCERIN (NITROSTAT) 0.4 MG SL tablet Place 1 tablet (0.4 mg total) under the tongue every 5 (five) minutes as needed for chest pain. 25 tablet 3  . pantoprazole (PROTONIX) 40 MG tablet Take 1 tablet (40 mg total) by mouth daily. 30 tablet 11  . pantoprazole (PROTONIX) 40 MG tablet     . potassium chloride SA (K-DUR,KLOR-CON) 20 MEQ tablet Take 20 mEq by mouth daily.    Marland Kitchen senna (SENOKOT) 8.6 MG tablet Take 1-2 tablets by mouth daily as needed for constipation.     . VENTOLIN HFA 108 (90 Base) MCG/ACT inhaler Inhale 2 puffs into the lungs every 6 (six) hours as needed for wheezing or shortness of breath.     . vitamin E (VITAMIN E) 400 UNIT capsule Take 800 Units by mouth daily.    Alveda Reasons 20 MG TABS tablet Take 20 mg by mouth daily.     Current Facility-Administered Medications  Medication Dose Route Frequency Provider Last Rate Last Dose  . 0.9 %  sodium chloride infusion  500 mL Intravenous Continuous Gatha Mayer, MD      . 0.9 %  sodium chloride infusion  500 mL Intravenous Continuous Gatha Mayer,  MD        Allergies  Allergen Reactions  . Codeine Other (See Comments)    Skin crawls, jittery, agitated  . Prednisone Other (See Comments)    Tachycardia, light headed, rash    Review of Systems  Constitutional: Negative for activity change, appetite change, chills, fever and unexpected weight change.  HENT: Negative for trouble swallowing and voice change.   Eyes: Negative for visual disturbance.  Respiratory: Positive for wheezing. Negative for cough and shortness of breath.   Cardiovascular: Negative for chest pain and palpitations.  Gastrointestinal: Negative for abdominal pain and blood in stool.  Genitourinary: Positive for hematuria (In July, resolved). Negative for dysuria.  Musculoskeletal: Positive for arthralgias, gait problem (Plantar fasciitis) and joint swelling.  Hematological: Negative for adenopathy. Bruises/bleeds easily (On Xarelto).  Psychiatric/Behavioral: The patient is nervous/anxious.   All other systems reviewed and are negative.   BP (!) 153/63   Pulse 64   Resp 18   Ht '5\' 3"'$  (1.6 m)   Wt 190 lb (86.2 kg)   SpO2 98% Comment: ON RA  BMI 33.66 kg/m  Physical Exam  Constitutional: She is oriented to person, place, and time. No distress.  Obese  HENT:  Head: Normocephalic and atraumatic.  Mouth/Throat: No oropharyngeal exudate.  Eyes: Conjunctivae and EOM are normal. No scleral icterus.  Neck: Neck supple. No thyromegaly present.  Cardiovascular: Normal rate, regular rhythm, normal heart sounds and intact distal pulses.  Exam reveals no gallop and no friction rub.   No murmur heard. Pulmonary/Chest: Effort normal and breath sounds normal. No respiratory distress. She has no wheezes. She has no rales.  Abdominal: Soft. She exhibits no distension. There is no tenderness.  Musculoskeletal: She exhibits no edema.  Lymphadenopathy:    She has no cervical adenopathy.  Neurological: She is alert and oriented to person, place, and time. No cranial nerve  deficit.  No focal motor deficit  Skin: Skin is warm and dry.  Vitals reviewed.    Diagnostic Tests: CT CHEST WITHOUT CONTRAST  TECHNIQUE: Multidetector CT imaging of the chest was performed following the standard protocol without IV contrast.  COMPARISON:  Chest CT 06/10/2016.  FINDINGS: Cardiovascular: Heart size is borderline enlarged. There is no significant pericardial fluid, thickening or pericardial calcification. There is aortic atherosclerosis, as well as atherosclerosis of the great vessels of the mediastinum and the coronary arteries, including calcified atherosclerotic plaque in the left main, left anterior descending, left circumflex and right coronary arteries. Calcifications of the mitral annulus.  Mediastinum/Nodes: No pathologically enlarged mediastinal or hilar lymph nodes. Please note that accurate exclusion of hilar adenopathy is limited on noncontrast CT scans. Esophagus is unremarkable in appearance. No axillary lymphadenopathy.  Lungs/Pleura: Several sub solid pulmonary nodules are again noted, most pronounced in the lung apices. Low largest of these is a 2.8 x 2.4 x 2.2 cm ground-glass attenuation nodule (axial image 20 of series 7 and coronal image 88 of series 5), which has a 6 mm solid component in the periphery of the lesion, best appreciated on coronal image 90 of series 5, where there is clear contact with the overlying pleura. Other 6 mm ground-glass attenuation nodules in the left upper lobe are noted on images 20 and 25 of series 7. 9 mm right lower lobe nodule (image 76 of series 7) is unchanged. 10 mm right lower lobe ground-glass attenuation nodule (image 101 of series 7) is also unchanged. Several other smaller sub cm ground-glass attenuation nodules are also noted and unchanged. No acute consolidative airspace disease. No pleural effusions. Extreme lung bases were incompletely imaged.  Upper Abdomen: 12 mm low-attenuation lesion  in segment 3 of the liver is incompletely characterized on today's noncontrast CT examination, but is the was previously characterized as a  simple cyst. Calcified granulomas in the liver. Aortic atherosclerosis.  Musculoskeletal: There are no aggressive appearing lytic or blastic lesions noted in the visualized portions of the skeleton.  IMPRESSION: 1. Although the previously noted sub solid pulmonary nodules appear generally unchanged compared to the prior examination, the largest of these nodules in the apex of the right upper lobe has a 6 mm solid component, and overall measures 2.8 x 2.4 x 2.2 cm. This is considered highly suspicious, and because of this, the overall appearance of the lungs is highly concerning for possible multifocal synchronous bronchogenic adenocarcinoma. PET-CT could be considered at this time, but may be falsely negative given that the solid component of the largest nodule is less than 8 mm in size. Alternatively, biopsy of this apical nodule could be considered. Surgical resection of lesions like this can also be considered, however, given the multifocality of findings on today's examination, surgical resection is not recommended for this patient. This recommendation follows the consensus statement: Guidelines for Management of Incidental Pulmonary Nodules Detected on CT Images: From the Fleischner Society 2017; Radiology 2017; 284:228-243. 2. Aortic atherosclerosis, in addition to left main and 3 vessel coronary artery disease. Assessment for potential risk factor modification, dietary therapy or pharmacologic therapy may be warranted, if clinically indicated. These results were called by telephone at the time of interpretation on 09/09/2016 at 1:23 pm to Dr. Abigail Butts MCNEILL, who verbally acknowledged these results.   Electronically Signed   By: Vinnie Langton M.D.   On: 09/09/2016 13:25 I personally reviewed the CT chest and concur with the  findings noted above  Impression: Mrs. Koroma is a 75 year old woman with multiple groundglass opacities in both lungs. The dominant GGO is a 2.8 x 2.4 x 2.2 cm nodule in the apex of the right lung. There is a likely 6 mm soft tissue component. This is highly suspicious for multifocal adenocarcinoma in situ. Infectious and inflammatory etiologies are also a possibility. She does not have any travel or exposure history that is pertinent.  She has a very light smoking history and she quit over 30 years ago. So I doubt this is in anyway smoking related.  There are 2 different ways we can go with this nodule. One would be just to proceed with VATS and wedge resection for definitive diagnosis and treatment. The other option would be to attempt to biopsy at first. The primary reason for doing that would be that if it turns out to be infectious or inflammatory she could avoid the need for surgery altogether. It would have to be a definitive diagnosis to rule out the possibility of adenocarcinoma in situ. I do think this is approachable with a navigational bronchoscopy.  I discussed these various options with the patient and her family. They understand that either approach is valid. After discussing the pros and cons of each of those approaches we decided to proceed with navigational bronchoscopy for biopsy. If that is not definitive for that shows cancer then a wedge resection would be the treatment of choice. An alternative to wedge resection would be stereotactic radiation.  I described the nature of the procedure to her this would be done in the operating room under general anesthesia. The procedure would be done endoscopically with no incisions. We will plan to do it on an outpatient basis. I reviewed the indications, risks, benefits, and alternatives. She understands the possibility of a nondiagnostic biopsy. She understands risks include those associated with general anesthesia including MI, stroke, DVT,  PE,  as well as those directly associated with the biopsy including bleeding and pneumothorax. She accepts the risk and agrees to proceed.  She will need to have her Xarelto held for 2 days prior to biopsy. I will clear that with Dr. Rayann Heman.  Plan: Electromagnetic navigational bronchoscopy on Thursday, 10/02/2016  [name], MD [facility] [phone]

## 2016-09-18 ENCOUNTER — Other Ambulatory Visit: Payer: Self-pay | Admitting: *Deleted

## 2016-09-18 DIAGNOSIS — R918 Other nonspecific abnormal finding of lung field: Secondary | ICD-10-CM

## 2016-09-19 ENCOUNTER — Other Ambulatory Visit: Payer: Self-pay | Admitting: *Deleted

## 2016-09-19 DIAGNOSIS — R918 Other nonspecific abnormal finding of lung field: Secondary | ICD-10-CM

## 2016-09-23 ENCOUNTER — Ambulatory Visit (INDEPENDENT_AMBULATORY_CARE_PROVIDER_SITE_OTHER): Payer: Commercial Managed Care - HMO | Admitting: Thoracic Surgery (Cardiothoracic Vascular Surgery)

## 2016-09-23 ENCOUNTER — Encounter: Payer: Self-pay | Admitting: Thoracic Surgery (Cardiothoracic Vascular Surgery)

## 2016-09-23 VITALS — BP 138/59 | HR 58 | Resp 20 | Ht 63.0 in | Wt 190.0 lb

## 2016-09-23 DIAGNOSIS — R918 Other nonspecific abnormal finding of lung field: Secondary | ICD-10-CM | POA: Diagnosis not present

## 2016-09-23 NOTE — Progress Notes (Signed)
Patient ID: Julie Gill, female   DOB: 06/25/1941, 75 y.o.   MRN: [id number]      [address]       [place] [address]             [phone]      [name] returns further discuss management of the groundglass opacity in her right upper lobe.  She is a 75 year old woman who had a very minor smoking history but quit over 30 years ago. She was found to have a groundglass opacity at the base the right lower lobe on a CT of the abdomen done to evaluate for kidney stones. A CT of the chest showed multiple groundglass opacities bilaterally, the largest being a 2.8 cm nodule in the apex of the right lung. On a follow-up CT in October the nodules persisted. There also was a 6 mm soft tissue component of the right apical nodule. This is highly suspicious for a well-differentiated adenocarcinoma in situ with possible development of an invasive component.  We discussed diagnostic and treatment options at her last visit on 09/17/2016. We discussed whether to do a navigational bronchoscopy or just proceed with a wedge resection.  We originally planned to proceed with a navigational bronchoscopy. However, after having time to think it over and discuss with her family she would like to proceed with surgical resection for definitive diagnosis and treatment of the right upper lobe apical groundglass opacity.  They had questions regarding the ability to remove the other groundglass opacities time surgery. I do not think that is possible based on their location. These will be amorphous and very difficult to identify and would require resection of a significant amount of the lower lobe to ensure complete resection. Therefore, the plan will be to do a wedge resection of the apical nodule alone.  I discussed the general nature of the procedure, the need for general anesthesia, and the incisions to be used with [name]. We discussed the expected hospital stay, the use of a chest  tube postoperatively, the overall recovery and short and long term outcomes. I informed them of the indications, risks, benefits, and alternatives. They understand the risks include, but are not limited to death, stroke, MI, DVT/PE, bleeding, possible need for transfusion, infections, prolonged air leaks, cardiac arrhythmias, as well as the possibility of other unforeseeable complications.   She accepts the risks and agrees to proceed.  Plan right VATS, wedge resection on Thursday, 10/02/2016.  I spent 15 minutes with [name] during this visit. The entire time was spent in discussion of treatment options.  Revonda Standard Roxan Hockey, MD [facility] [phone]

## 2016-09-29 ENCOUNTER — Encounter (HOSPITAL_COMMUNITY)
Admission: RE | Admit: 2016-09-29 | Discharge: 2016-09-29 | Disposition: A | Discharge status: Home / Self Care | Payer: Commercial Managed Care - HMO | Source: Ambulatory Visit | Attending: Thoracic Surgery (Cardiothoracic Vascular Surgery) | Admitting: Thoracic Surgery (Cardiothoracic Vascular Surgery)

## 2016-09-29 ENCOUNTER — Encounter (HOSPITAL_COMMUNITY): Payer: Self-pay

## 2016-09-29 DIAGNOSIS — R918 Other nonspecific abnormal finding of lung field: Secondary | ICD-10-CM

## 2016-09-29 DIAGNOSIS — Z87440 Personal history of urinary (tract) infections: Secondary | ICD-10-CM | POA: Diagnosis not present

## 2016-09-29 DIAGNOSIS — Z452 Encounter for adjustment and management of vascular access device: Secondary | ICD-10-CM | POA: Diagnosis not present

## 2016-09-29 DIAGNOSIS — C3411 Malignant neoplasm of upper lobe, right bronchus or lung: Secondary | ICD-10-CM | POA: Diagnosis present

## 2016-09-29 DIAGNOSIS — R0602 Shortness of breath: Secondary | ICD-10-CM | POA: Diagnosis not present

## 2016-09-29 DIAGNOSIS — R05 Cough: Secondary | ICD-10-CM | POA: Diagnosis not present

## 2016-09-29 DIAGNOSIS — J939 Pneumothorax, unspecified: Secondary | ICD-10-CM | POA: Diagnosis not present

## 2016-09-29 DIAGNOSIS — C3491 Malignant neoplasm of unspecified part of right bronchus or lung: Secondary | ICD-10-CM | POA: Diagnosis not present

## 2016-09-29 DIAGNOSIS — I4891 Unspecified atrial fibrillation: Secondary | ICD-10-CM | POA: Diagnosis not present

## 2016-09-29 DIAGNOSIS — I480 Paroxysmal atrial fibrillation: Secondary | ICD-10-CM | POA: Diagnosis present

## 2016-09-29 DIAGNOSIS — D62 Acute posthemorrhagic anemia: Secondary | ICD-10-CM | POA: Diagnosis not present

## 2016-09-29 DIAGNOSIS — Z7901 Long term (current) use of anticoagulants: Secondary | ICD-10-CM | POA: Diagnosis not present

## 2016-09-29 DIAGNOSIS — I340 Nonrheumatic mitral (valve) insufficiency: Secondary | ICD-10-CM | POA: Diagnosis present

## 2016-09-29 DIAGNOSIS — M1990 Unspecified osteoarthritis, unspecified site: Secondary | ICD-10-CM | POA: Diagnosis present

## 2016-09-29 DIAGNOSIS — Z87891 Personal history of nicotine dependence: Secondary | ICD-10-CM | POA: Diagnosis not present

## 2016-09-29 DIAGNOSIS — R001 Bradycardia, unspecified: Secondary | ICD-10-CM | POA: Diagnosis not present

## 2016-09-29 DIAGNOSIS — G4733 Obstructive sleep apnea (adult) (pediatric): Secondary | ICD-10-CM | POA: Diagnosis present

## 2016-09-29 DIAGNOSIS — I10 Essential (primary) hypertension: Secondary | ICD-10-CM | POA: Diagnosis present

## 2016-09-29 DIAGNOSIS — J45909 Unspecified asthma, uncomplicated: Secondary | ICD-10-CM | POA: Diagnosis present

## 2016-09-29 DIAGNOSIS — Z833 Family history of diabetes mellitus: Secondary | ICD-10-CM | POA: Diagnosis not present

## 2016-09-29 DIAGNOSIS — R911 Solitary pulmonary nodule: Secondary | ICD-10-CM | POA: Diagnosis present

## 2016-09-29 DIAGNOSIS — R42 Dizziness and giddiness: Secondary | ICD-10-CM | POA: Diagnosis not present

## 2016-09-29 DIAGNOSIS — Z79899 Other long term (current) drug therapy: Secondary | ICD-10-CM | POA: Diagnosis not present

## 2016-09-29 DIAGNOSIS — C3490 Malignant neoplasm of unspecified part of unspecified bronchus or lung: Secondary | ICD-10-CM | POA: Diagnosis not present

## 2016-09-29 DIAGNOSIS — Z86010 Personal history of colonic polyps: Secondary | ICD-10-CM | POA: Diagnosis not present

## 2016-09-29 DIAGNOSIS — Z800 Family history of malignant neoplasm of digestive organs: Secondary | ICD-10-CM | POA: Diagnosis not present

## 2016-09-29 DIAGNOSIS — Z801 Family history of malignant neoplasm of trachea, bronchus and lung: Secondary | ICD-10-CM | POA: Diagnosis not present

## 2016-09-29 DIAGNOSIS — R079 Chest pain, unspecified: Secondary | ICD-10-CM | POA: Diagnosis not present

## 2016-09-29 DIAGNOSIS — Z8249 Family history of ischemic heart disease and other diseases of the circulatory system: Secondary | ICD-10-CM | POA: Diagnosis not present

## 2016-09-29 DIAGNOSIS — G2581 Restless legs syndrome: Secondary | ICD-10-CM | POA: Diagnosis present

## 2016-09-29 DIAGNOSIS — Z4682 Encounter for fitting and adjustment of non-vascular catheter: Secondary | ICD-10-CM | POA: Diagnosis not present

## 2016-09-29 DIAGNOSIS — E785 Hyperlipidemia, unspecified: Secondary | ICD-10-CM | POA: Diagnosis present

## 2016-09-29 DIAGNOSIS — E669 Obesity, unspecified: Secondary | ICD-10-CM | POA: Diagnosis present

## 2016-09-29 DIAGNOSIS — K219 Gastro-esophageal reflux disease without esophagitis: Secondary | ICD-10-CM | POA: Diagnosis present

## 2016-09-29 DIAGNOSIS — I2510 Atherosclerotic heart disease of native coronary artery without angina pectoris: Secondary | ICD-10-CM | POA: Diagnosis present

## 2016-09-29 DIAGNOSIS — Z90710 Acquired absence of both cervix and uterus: Secondary | ICD-10-CM | POA: Diagnosis not present

## 2016-09-29 HISTORY — DX: Personal history of urinary (tract) infections: Z87.440

## 2016-09-29 HISTORY — DX: Cardiac arrhythmia, unspecified: I49.9

## 2016-09-29 HISTORY — DX: Unspecified asthma, uncomplicated: J45.909

## 2016-09-29 HISTORY — DX: Stress incontinence (female) (male): N39.3

## 2016-09-29 LAB — BLOOD GAS, ARTERIAL
Acid-Base Excess: 2.6 mmol/L — ABNORMAL HIGH (ref 0.0–2.0)
Bicarbonate: 26.9 mmol/L (ref 20.0–28.0)
Drawn by: 242311
FIO2: 21
O2 Saturation: 93.9 %
Patient temperature: 98.6
pCO2 arterial: 43.5 mmHg (ref 32.0–48.0)
pH, Arterial: 7.408 (ref 7.350–7.450)
pO2, Arterial: 70 mmHg — ABNORMAL LOW (ref 83.0–108.0)

## 2016-09-29 LAB — SURGICAL PCR SCREEN
MRSA, PCR: NEGATIVE
Staphylococcus aureus: NEGATIVE

## 2016-09-29 LAB — CBC
HCT: 37.7 % (ref 36.0–46.0)
Hemoglobin: 12.5 g/dL (ref 12.0–15.0)
MCH: 29.8 pg (ref 26.0–34.0)
MCHC: 33.2 g/dL (ref 30.0–36.0)
MCV: 90 fL (ref 78.0–100.0)
Platelets: 256 10*3/uL (ref 150–400)
RBC: 4.19 MIL/uL (ref 3.87–5.11)
RDW: 13.3 % (ref 11.5–15.5)
WBC: 7.8 10*3/uL (ref 4.0–10.5)

## 2016-09-29 LAB — URINALYSIS, ROUTINE W REFLEX MICROSCOPIC
Bilirubin Urine: NEGATIVE
Glucose, UA: NEGATIVE mg/dL
Hgb urine dipstick: NEGATIVE
Ketones, ur: NEGATIVE mg/dL
Leukocytes, UA: NEGATIVE
Nitrite: NEGATIVE
Protein, ur: NEGATIVE mg/dL
Specific Gravity, Urine: 1.009 (ref 1.005–1.030)
pH: 6.5 (ref 5.0–8.0)

## 2016-09-29 LAB — APTT: aPTT: 33 seconds (ref 24–36)

## 2016-09-29 LAB — TYPE AND SCREEN
ABO/RH(D): O POS
Antibody Screen: NEGATIVE

## 2016-09-29 LAB — ABO/RH: ABO/RH(D): O POS

## 2016-09-29 LAB — PROTIME-INR
INR: 1.25
Prothrombin Time: 15.8 seconds — ABNORMAL HIGH (ref 11.4–15.2)

## 2016-09-29 NOTE — Pre-Procedure Instructions (Signed)
    Julie Gill  09/29/2016      [facility] [place], [address] [place] [place] [address] Phone: [phone] Fax: [phone]  [facility] [address], [address] [place] [address] Phone: [phone] Fax: [phone]  [facility] - [address], [place] [address] Phone: [phone] Fax: [phone]    Your procedure is scheduled on : Thursday, November 2nd   Report to [facility] Admitting at 11:30 AM             (posted surgery time 1:30 pm - 3:57 pm)   Call this number if you have problems the morning of surgery:  [phone]   Remember:  Do not eat food or drink liquids after midnight Wednesday.   Take these medicines the morning of surgery with A SIP OF WATER : Metoprolol, Protonix, Klonopin             Please use any and all inhalers that morning.             4-5 days prior to surgery, STOP TAKING any vitamins, herbal supplements, anti-inflammatories.              (Xarelto to be stopped                             )   Do not wear jewelry, make-up or nail polish.  Do not wear lotions, powders, perfumes, or deoderant.   Do not shave 48 hours prior to surgery.    Do not bring valuables to the hospital.  [facility] is not responsible for any belongings or valuables.  Contacts, dentures or bridgework may not be worn into surgery.  Leave your suitcase in the car.  After surgery it may be brought to your room.  For patients admitted to the hospital, discharge time will be determined by your treatment team.  Please read over the following fact sheets that you were given. Pain Booklet, MRSA Information and Surgical Site Infection Prevention

## 2016-09-29 NOTE — Progress Notes (Signed)
Lab called today, and needs the CMP to be redrawn "because it hemolyzed".    Order placed for DOS.

## 2016-09-29 NOTE — Progress Notes (Addendum)
PCP is Dr. Amedeo Gory     EP MD is Dr. Rayann Heman.  LOV 08/2016 She states she had sleep study done approx  7-8 yrs ago.   "mild case"...doesn't use equipment, and they believe it was RLS Has history of A-fib, which she had an ablation (by Dr. Rayann Heman) back in 2014 she thinks. Denies any current heart problems. She has been instructed by Dr. Rayann Heman to take her last dose of Xarelto tonight (Monday 10/30)

## 2016-09-30 NOTE — Progress Notes (Signed)
Patient called to ask if she could take potassium. I advised patient that she could take potassium but not to take it the day of surgery. Answer additional question about medications.

## 2016-10-01 ENCOUNTER — Encounter: Payer: Self-pay | Admitting: *Deleted

## 2016-10-02 ENCOUNTER — Inpatient Hospital Stay (HOSPITAL_COMMUNITY): Payer: Commercial Managed Care - HMO | Admitting: Certified Registered"

## 2016-10-02 ENCOUNTER — Ambulatory Visit: Admit: 2016-10-02 | Payer: Commercial Managed Care - HMO | Admitting: Thoracic Surgery (Cardiothoracic Vascular Surgery)

## 2016-10-02 ENCOUNTER — Inpatient Hospital Stay (HOSPITAL_COMMUNITY): Payer: Commercial Managed Care - HMO

## 2016-10-02 ENCOUNTER — Inpatient Hospital Stay (HOSPITAL_COMMUNITY)
Admission: RE | Admit: 2016-10-02 | Discharge: 2016-10-07 | DRG: 164 | Disposition: A | Discharge status: Home / Self Care | Payer: Commercial Managed Care - HMO | Source: Ambulatory Visit | Attending: Thoracic Surgery (Cardiothoracic Vascular Surgery) | Admitting: Thoracic Surgery (Cardiothoracic Vascular Surgery)

## 2016-10-02 ENCOUNTER — Encounter (HOSPITAL_COMMUNITY): Payer: Self-pay | Admitting: *Deleted

## 2016-10-02 ENCOUNTER — Encounter (HOSPITAL_COMMUNITY)
Admission: RE | Disposition: A | Discharge status: Home / Self Care | Payer: Self-pay | Source: Ambulatory Visit | Attending: Thoracic Surgery (Cardiothoracic Vascular Surgery)

## 2016-10-02 DIAGNOSIS — Z79899 Other long term (current) drug therapy: Secondary | ICD-10-CM | POA: Diagnosis not present

## 2016-10-02 DIAGNOSIS — Z7901 Long term (current) use of anticoagulants: Secondary | ICD-10-CM | POA: Diagnosis not present

## 2016-10-02 DIAGNOSIS — Z800 Family history of malignant neoplasm of digestive organs: Secondary | ICD-10-CM | POA: Diagnosis not present

## 2016-10-02 DIAGNOSIS — M62838 Other muscle spasm: Secondary | ICD-10-CM | POA: Diagnosis not present

## 2016-10-02 DIAGNOSIS — R001 Bradycardia, unspecified: Secondary | ICD-10-CM | POA: Diagnosis not present

## 2016-10-02 DIAGNOSIS — I480 Paroxysmal atrial fibrillation: Secondary | ICD-10-CM | POA: Diagnosis present

## 2016-10-02 DIAGNOSIS — D62 Acute posthemorrhagic anemia: Secondary | ICD-10-CM | POA: Diagnosis not present

## 2016-10-02 DIAGNOSIS — Z801 Family history of malignant neoplasm of trachea, bronchus and lung: Secondary | ICD-10-CM

## 2016-10-02 DIAGNOSIS — F419 Anxiety disorder, unspecified: Secondary | ICD-10-CM | POA: Diagnosis present

## 2016-10-02 DIAGNOSIS — I340 Nonrheumatic mitral (valve) insufficiency: Secondary | ICD-10-CM | POA: Diagnosis present

## 2016-10-02 DIAGNOSIS — R42 Dizziness and giddiness: Secondary | ICD-10-CM | POA: Diagnosis not present

## 2016-10-02 DIAGNOSIS — Z4682 Encounter for fitting and adjustment of non-vascular catheter: Secondary | ICD-10-CM

## 2016-10-02 DIAGNOSIS — Z87891 Personal history of nicotine dependence: Secondary | ICD-10-CM

## 2016-10-02 DIAGNOSIS — Z6833 Body mass index (BMI) 33.0-33.9, adult: Secondary | ICD-10-CM

## 2016-10-02 DIAGNOSIS — C3490 Malignant neoplasm of unspecified part of unspecified bronchus or lung: Secondary | ICD-10-CM

## 2016-10-02 DIAGNOSIS — K219 Gastro-esophageal reflux disease without esophagitis: Secondary | ICD-10-CM | POA: Diagnosis present

## 2016-10-02 DIAGNOSIS — C3491 Malignant neoplasm of unspecified part of right bronchus or lung: Secondary | ICD-10-CM | POA: Diagnosis not present

## 2016-10-02 DIAGNOSIS — R05 Cough: Secondary | ICD-10-CM | POA: Diagnosis not present

## 2016-10-02 DIAGNOSIS — E669 Obesity, unspecified: Secondary | ICD-10-CM | POA: Diagnosis present

## 2016-10-02 DIAGNOSIS — I2510 Atherosclerotic heart disease of native coronary artery without angina pectoris: Secondary | ICD-10-CM | POA: Diagnosis present

## 2016-10-02 DIAGNOSIS — R911 Solitary pulmonary nodule: Secondary | ICD-10-CM

## 2016-10-02 DIAGNOSIS — I10 Essential (primary) hypertension: Secondary | ICD-10-CM | POA: Diagnosis present

## 2016-10-02 DIAGNOSIS — J939 Pneumothorax, unspecified: Secondary | ICD-10-CM

## 2016-10-02 DIAGNOSIS — Z8249 Family history of ischemic heart disease and other diseases of the circulatory system: Secondary | ICD-10-CM

## 2016-10-02 DIAGNOSIS — M1990 Unspecified osteoarthritis, unspecified site: Secondary | ICD-10-CM | POA: Diagnosis present

## 2016-10-02 DIAGNOSIS — J45909 Unspecified asthma, uncomplicated: Secondary | ICD-10-CM | POA: Diagnosis present

## 2016-10-02 DIAGNOSIS — E785 Hyperlipidemia, unspecified: Secondary | ICD-10-CM | POA: Diagnosis present

## 2016-10-02 DIAGNOSIS — Z86010 Personal history of colonic polyps: Secondary | ICD-10-CM

## 2016-10-02 DIAGNOSIS — Z833 Family history of diabetes mellitus: Secondary | ICD-10-CM

## 2016-10-02 DIAGNOSIS — G4733 Obstructive sleep apnea (adult) (pediatric): Secondary | ICD-10-CM | POA: Diagnosis present

## 2016-10-02 DIAGNOSIS — R918 Other nonspecific abnormal finding of lung field: Secondary | ICD-10-CM

## 2016-10-02 DIAGNOSIS — Z90710 Acquired absence of both cervix and uterus: Secondary | ICD-10-CM | POA: Diagnosis not present

## 2016-10-02 DIAGNOSIS — Z87440 Personal history of urinary (tract) infections: Secondary | ICD-10-CM

## 2016-10-02 DIAGNOSIS — R51 Headache: Secondary | ICD-10-CM | POA: Diagnosis not present

## 2016-10-02 DIAGNOSIS — C3411 Malignant neoplasm of upper lobe, right bronchus or lung: Secondary | ICD-10-CM | POA: Diagnosis present

## 2016-10-02 DIAGNOSIS — Z09 Encounter for follow-up examination after completed treatment for conditions other than malignant neoplasm: Secondary | ICD-10-CM

## 2016-10-02 DIAGNOSIS — G2581 Restless legs syndrome: Secondary | ICD-10-CM | POA: Diagnosis present

## 2016-10-02 DIAGNOSIS — Z9889 Other specified postprocedural states: Secondary | ICD-10-CM

## 2016-10-02 HISTORY — PX: VIDEO ASSISTED THORACOSCOPY (VATS)/WEDGE RESECTION: SHX6174

## 2016-10-02 LAB — COMPREHENSIVE METABOLIC PANEL
ALT: 25 U/L (ref 14–54)
AST: 26 U/L (ref 15–41)
Albumin: 3.7 g/dL (ref 3.5–5.0)
Alkaline Phosphatase: 78 U/L (ref 38–126)
Anion gap: 9 (ref 5–15)
BUN: 12 mg/dL (ref 6–20)
CO2: 26 mmol/L (ref 22–32)
Calcium: 9.4 mg/dL (ref 8.9–10.3)
Chloride: 105 mmol/L (ref 101–111)
Creatinine, Ser: 0.8 mg/dL (ref 0.44–1.00)
GFR calc Af Amer: >60 mL/min (ref >60)
GFR calc non Af Amer: >60 mL/min (ref >60)
Glucose, Bld: 94 mg/dL (ref 65–99)
Potassium: 3.8 mmol/L (ref 3.5–5.1)
Sodium: 140 mmol/L (ref 135–145)
Total Bilirubin: 0.5 mg/dL (ref 0.3–1.2)
Total Protein: 6 g/dL — ABNORMAL LOW (ref 6.5–8.1)

## 2016-10-02 SURGERY — VIDEO ASSISTED THORACOSCOPY (VATS)/WEDGE RESECTION
Anesthesia: General | Site: Chest | Laterality: Right

## 2016-10-02 SURGERY — VIDEO BRONCHOSCOPY WITH ENDOBRONCHIAL NAVIGATION
Anesthesia: General

## 2016-10-02 MED ORDER — ACETAMINOPHEN 160 MG/5ML PO SOLN
1000.0000 mg | Freq: Four times a day (QID) | ORAL | Status: DC
  Filled 2016-10-02: qty 40.6

## 2016-10-02 MED ORDER — HYDROMORPHONE HCL 2 MG/ML IJ SOLN
INTRAMUSCULAR | Status: AC
  Filled 2016-10-02: qty 1

## 2016-10-02 MED ORDER — ONDANSETRON HCL 4 MG/2ML IJ SOLN
INTRAMUSCULAR | Status: AC
  Filled 2016-10-02: qty 2

## 2016-10-02 MED ORDER — FENTANYL CITRATE (PF) 100 MCG/2ML IJ SOLN
INTRAMUSCULAR | Status: AC
  Administered 2016-10-02: 100 ug
  Filled 2016-10-02: qty 2

## 2016-10-02 MED ORDER — DILTIAZEM HCL 100 MG IV SOLR
5.0000 mg/h | INTRAVENOUS | Status: DC
  Administered 2016-10-02: 5 mg/h via INTRAVENOUS
  Filled 2016-10-02: qty 100

## 2016-10-02 MED ORDER — FENTANYL 40 MCG/ML IV SOLN
INTRAVENOUS | Status: AC
  Filled 2016-10-02: qty 25

## 2016-10-02 MED ORDER — NALOXONE HCL 0.4 MG/ML IJ SOLN: 0.4000 mg | INTRAMUSCULAR | Status: DC | PRN

## 2016-10-02 MED ORDER — METOPROLOL TARTRATE 12.5 MG HALF TABLET
12.5000 mg | ORAL_TABLET | Freq: Two times a day (BID) | ORAL | Status: DC
  Administered 2016-10-04 – 2016-10-05 (×4): 12.5 mg via ORAL
  Filled 2016-10-02 (×5): qty 1

## 2016-10-02 MED ORDER — BUPIVACAINE HCL (PF) 0.5 % IJ SOLN
INTRAMUSCULAR | Status: DC | PRN
Start: 2016-10-02 — End: 2016-10-02
  Administered 2016-10-02: 10 mL

## 2016-10-02 MED ORDER — OXYCODONE HCL 5 MG PO TABS
5.0000 mg | ORAL_TABLET | ORAL | Status: DC | PRN
  Administered 2016-10-05 – 2016-10-06 (×3): 10 mg via ORAL
  Filled 2016-10-02 (×3): qty 2

## 2016-10-02 MED ORDER — MAGNESIUM OXIDE 400 (241.3 MG) MG PO TABS
200.0000 mg | ORAL_TABLET | Freq: Every day | ORAL | Status: DC
Start: 2016-10-03 — End: 2016-10-07
  Administered 2016-10-03 – 2016-10-07 (×5): 200 mg via ORAL
  Filled 2016-10-02 (×5): qty 1

## 2016-10-02 MED ORDER — ONDANSETRON HCL 4 MG/2ML IJ SOLN
INTRAMUSCULAR | Status: DC | PRN
  Administered 2016-10-02: 4 mg via INTRAVENOUS

## 2016-10-02 MED ORDER — 0.9 % SODIUM CHLORIDE (POUR BTL) OPTIME
TOPICAL | Status: DC | PRN
  Administered 2016-10-02: 2000 mL

## 2016-10-02 MED ORDER — BUPIVACAINE 0.5 % ON-Q PUMP SINGLE CATH 400 ML
400.0000 mL | INJECTION | Status: DC
  Filled 2016-10-02: qty 400

## 2016-10-02 MED ORDER — BISACODYL 5 MG PO TBEC
10.0000 mg | DELAYED_RELEASE_TABLET | Freq: Every day | ORAL | Status: DC
  Administered 2016-10-03 – 2016-10-07 (×5): 10 mg via ORAL
  Filled 2016-10-02 (×5): qty 2

## 2016-10-02 MED ORDER — NITROGLYCERIN 0.4 MG SL SUBL: 0.4000 mg | SUBLINGUAL_TABLET | SUBLINGUAL | Status: DC | PRN

## 2016-10-02 MED ORDER — SODIUM CHLORIDE 0.9 % IV SOLN
INTRAVENOUS | Status: DC
  Administered 2016-10-02: 20:00:00 via INTRAVENOUS

## 2016-10-02 MED ORDER — TRAMADOL HCL 50 MG PO TABS
50.0000 mg | ORAL_TABLET | Freq: Four times a day (QID) | ORAL | Status: DC | PRN
  Administered 2016-10-06 – 2016-10-07 (×3): 100 mg via ORAL
  Filled 2016-10-02 (×3): qty 2

## 2016-10-02 MED ORDER — LIDOCAINE HCL (CARDIAC) 20 MG/ML IV SOLN
INTRAVENOUS | Status: DC | PRN
  Administered 2016-10-02: 100 mg via INTRAVENOUS

## 2016-10-02 MED ORDER — ACETAMINOPHEN 500 MG PO TABS
1000.0000 mg | ORAL_TABLET | Freq: Four times a day (QID) | ORAL | Status: DC
  Administered 2016-10-02 – 2016-10-06 (×16): 1000 mg via ORAL
  Filled 2016-10-02 (×18): qty 2

## 2016-10-02 MED ORDER — CEFUROXIME SODIUM 1.5 G IJ SOLR
1.5000 g | Freq: Two times a day (BID) | INTRAMUSCULAR | Status: AC
  Administered 2016-10-03 (×2): 1.5 g via INTRAVENOUS
  Filled 2016-10-02 (×2): qty 1.5

## 2016-10-02 MED ORDER — PANTOPRAZOLE SODIUM 40 MG PO TBEC
40.0000 mg | DELAYED_RELEASE_TABLET | Freq: Every evening | ORAL | Status: DC
  Administered 2016-10-02 – 2016-10-06 (×5): 40 mg via ORAL
  Filled 2016-10-02 (×5): qty 1

## 2016-10-02 MED ORDER — SUGAMMADEX SODIUM 200 MG/2ML IV SOLN
INTRAVENOUS | Status: DC | PRN
  Administered 2016-10-02: 180 mg via INTRAVENOUS

## 2016-10-02 MED ORDER — SUGAMMADEX SODIUM 200 MG/2ML IV SOLN
INTRAVENOUS | Status: AC
  Filled 2016-10-02: qty 2

## 2016-10-02 MED ORDER — DIPHENHYDRAMINE HCL 12.5 MG/5ML PO ELIX: 12.5000 mg | ORAL_SOLUTION | Freq: Four times a day (QID) | ORAL | Status: DC | PRN

## 2016-10-02 MED ORDER — ALBUTEROL SULFATE (2.5 MG/3ML) 0.083% IN NEBU: 3.0000 mL | INHALATION_SOLUTION | Freq: Four times a day (QID) | RESPIRATORY_TRACT | Status: DC | PRN

## 2016-10-02 MED ORDER — SODIUM CHLORIDE 0.9% FLUSH
9.0000 mL | INTRAVENOUS | Status: DC | PRN
Start: 2016-10-02 — End: 2016-10-03

## 2016-10-02 MED ORDER — DIPHENHYDRAMINE HCL 50 MG/ML IJ SOLN: 12.5000 mg | Freq: Four times a day (QID) | INTRAMUSCULAR | Status: DC | PRN

## 2016-10-02 MED ORDER — MIDAZOLAM HCL 2 MG/2ML IJ SOLN
INTRAMUSCULAR | Status: AC
  Administered 2016-10-02: 2 mg
  Filled 2016-10-02: qty 2

## 2016-10-02 MED ORDER — DEXTROSE 5 % IV SOLN
INTRAVENOUS | Status: AC
  Filled 2016-10-02: qty 1.5

## 2016-10-02 MED ORDER — MIDAZOLAM HCL 2 MG/2ML IJ SOLN
INTRAMUSCULAR | Status: AC
Start: 2016-10-02 — End: 2016-10-02
  Filled 2016-10-02: qty 2

## 2016-10-02 MED ORDER — ONDANSETRON HCL 4 MG/2ML IJ SOLN: 4.0000 mg | Freq: Four times a day (QID) | INTRAMUSCULAR | Status: DC | PRN

## 2016-10-02 MED ORDER — ENOXAPARIN SODIUM 40 MG/0.4ML ~~LOC~~ SOLN
40.0000 mg | Freq: Every day | SUBCUTANEOUS | Status: AC
  Administered 2016-10-03: 40 mg via SUBCUTANEOUS
  Filled 2016-10-02: qty 0.4

## 2016-10-02 MED ORDER — FENTANYL CITRATE (PF) 100 MCG/2ML IJ SOLN
INTRAMUSCULAR | Status: DC | PRN
  Administered 2016-10-02: 50 ug via INTRAVENOUS
  Administered 2016-10-02 (×2): 100 ug via INTRAVENOUS

## 2016-10-02 MED ORDER — DILTIAZEM LOAD VIA INFUSION
20.0000 mg | Freq: Once | INTRAVENOUS | Status: AC
  Administered 2016-10-02: 20 mg via INTRAVENOUS
  Filled 2016-10-02: qty 20

## 2016-10-02 MED ORDER — ONDANSETRON HCL 4 MG/2ML IJ SOLN: 4.0000 mg | Freq: Once | INTRAMUSCULAR | Status: DC | PRN

## 2016-10-02 MED ORDER — ROCURONIUM BROMIDE 100 MG/10ML IV SOLN
INTRAVENOUS | Status: DC | PRN
  Administered 2016-10-02: 30 mg via INTRAVENOUS
  Administered 2016-10-02: 50 mg via INTRAVENOUS

## 2016-10-02 MED ORDER — DEXTROSE 5 % IV SOLN
1.5000 g | INTRAVENOUS | Status: AC
  Administered 2016-10-02: 1.5 g via INTRAVENOUS

## 2016-10-02 MED ORDER — BUPIVACAINE 0.5 % ON-Q PUMP SINGLE CATH 400 ML
INJECTION | Status: DC | PRN
  Administered 2016-10-02: 400 mL

## 2016-10-02 MED ORDER — LACTATED RINGERS IV SOLN
INTRAVENOUS | Status: DC | PRN
  Administered 2016-10-02 (×2): via INTRAVENOUS

## 2016-10-02 MED ORDER — PHENYLEPHRINE HCL 10 MG/ML IJ SOLN
INTRAVENOUS | Status: DC | PRN
  Administered 2016-10-02: 20 ug/min via INTRAVENOUS

## 2016-10-02 MED ORDER — DILTIAZEM HCL 25 MG/5ML IV SOLN
20.0000 mg | Freq: Once | INTRAVENOUS | Status: DC
  Filled 2016-10-02: qty 5

## 2016-10-02 MED ORDER — MIDAZOLAM HCL 5 MG/5ML IJ SOLN
INTRAMUSCULAR | Status: DC | PRN
  Administered 2016-10-02: 2 mg via INTRAVENOUS

## 2016-10-02 MED ORDER — FENTANYL CITRATE (PF) 250 MCG/5ML IJ SOLN
INTRAMUSCULAR | Status: AC
  Filled 2016-10-02: qty 5

## 2016-10-02 MED ORDER — PROPOFOL 10 MG/ML IV BOLUS
INTRAVENOUS | Status: AC
  Filled 2016-10-02: qty 20

## 2016-10-02 MED ORDER — BUPIVACAINE HCL (PF) 0.5 % IJ SOLN
INTRAMUSCULAR | Status: AC
  Filled 2016-10-02: qty 10

## 2016-10-02 MED ORDER — FENTANYL 40 MCG/ML IV SOLN
INTRAVENOUS | Status: DC
  Administered 2016-10-02: 17:00:00 via INTRAVENOUS
  Administered 2016-10-02: 20 ug via INTRAVENOUS
  Administered 2016-10-02: 30 ug via INTRAVENOUS
  Administered 2016-10-03: 60 ug via INTRAVENOUS
  Administered 2016-10-03: 30 ug via INTRAVENOUS

## 2016-10-02 MED ORDER — LACTATED RINGERS IV SOLN
INTRAVENOUS | Status: DC
  Administered 2016-10-02: 12:00:00 via INTRAVENOUS

## 2016-10-02 MED ORDER — POTASSIUM CHLORIDE 10 MEQ/50ML IV SOLN: 10.0000 meq | Freq: Every day | INTRAVENOUS | Status: DC | PRN

## 2016-10-02 MED ORDER — PRAVASTATIN SODIUM 40 MG PO TABS
40.0000 mg | ORAL_TABLET | Freq: Every day | ORAL | Status: DC
  Administered 2016-10-02 – 2016-10-06 (×5): 40 mg via ORAL
  Filled 2016-10-02 (×5): qty 1

## 2016-10-02 MED ORDER — HYDROMORPHONE HCL 1 MG/ML IJ SOLN
0.2500 mg | INTRAMUSCULAR | Status: DC | PRN
  Administered 2016-10-02 (×2): 0.5 mg via INTRAVENOUS

## 2016-10-02 MED ORDER — ONDANSETRON HCL 4 MG/2ML IJ SOLN
4.0000 mg | Freq: Four times a day (QID) | INTRAMUSCULAR | Status: DC | PRN
Start: 2016-10-02 — End: 2016-10-03

## 2016-10-02 MED ORDER — MEPERIDINE HCL 25 MG/ML IJ SOLN: 6.2500 mg | INTRAMUSCULAR | Status: DC | PRN

## 2016-10-02 MED ORDER — CLONAZEPAM 0.5 MG PO TABS
0.2500 mg | ORAL_TABLET | Freq: Two times a day (BID) | ORAL | Status: DC | PRN
  Administered 2016-10-07 (×2): 0.25 mg via ORAL
  Filled 2016-10-02 (×2): qty 1

## 2016-10-02 MED ORDER — SENNOSIDES-DOCUSATE SODIUM 8.6-50 MG PO TABS
1.0000 | ORAL_TABLET | Freq: Every day | ORAL | Status: DC
  Administered 2016-10-02 – 2016-10-06 (×4): 1 via ORAL
  Filled 2016-10-02 (×4): qty 1

## 2016-10-02 MED ORDER — PROPOFOL 10 MG/ML IV BOLUS
INTRAVENOUS | Status: DC | PRN
  Administered 2016-10-02: 150 mg via INTRAVENOUS

## 2016-10-02 SURGICAL SUPPLY — 93 items
ADH SKN CLS APL DERMABOND .7 (GAUZE/BANDAGES/DRESSINGS) ×1
APL SKNCLS STERI-STRIP NONHPOA (GAUZE/BANDAGES/DRESSINGS) ×1
BAG SPEC RTRVL LRG 6X4 10 (ENDOMECHANICALS)
BENZOIN TINCTURE PRP APPL 2/3 (GAUZE/BANDAGES/DRESSINGS) ×3 IMPLANT
CANISTER SUCTION 2500CC (MISCELLANEOUS) ×6 IMPLANT
CATH KIT ON Q 5IN SLV (PAIN MANAGEMENT) IMPLANT
CATH KIT ON-Q SILVERSOAK 5 (CATHETERS) IMPLANT
CATH KIT ON-Q SILVERSOAK 5IN (CATHETERS) ×3 IMPLANT
CATH THORACIC 28FR (CATHETERS) ×2 IMPLANT
CATH THORACIC 36FR (CATHETERS) IMPLANT
CATH THORACIC 36FR RT ANG (CATHETERS) IMPLANT
CLIP TI MEDIUM 6 (CLIP) ×3 IMPLANT
CONN ST 1/4X3/8  BEN (MISCELLANEOUS)
CONN ST 1/4X3/8 BEN (MISCELLANEOUS) IMPLANT
CONN Y 3/8X3/8X3/8  BEN (MISCELLANEOUS)
CONN Y 3/8X3/8X3/8 BEN (MISCELLANEOUS) IMPLANT
CONT SPEC 4OZ CLIKSEAL STRL BL (MISCELLANEOUS) ×6 IMPLANT
COVER SURGICAL LIGHT HANDLE (MISCELLANEOUS) ×3 IMPLANT
DECANTER SPIKE VIAL GLASS SM (MISCELLANEOUS) ×2 IMPLANT
DERMABOND ADVANCED (GAUZE/BANDAGES/DRESSINGS) ×2
DERMABOND ADVANCED .7 DNX12 (GAUZE/BANDAGES/DRESSINGS) IMPLANT
DRAIN CHANNEL 28F RND 3/8 FF (WOUND CARE) IMPLANT
DRAIN CHANNEL 32F RND 10.7 FF (WOUND CARE) IMPLANT
DRAPE LAPAROSCOPIC ABDOMINAL (DRAPES) ×3 IMPLANT
DRAPE WARM FLUID 44X44 (DRAPE) ×3 IMPLANT
ELECT BLADE 6.5 EXT (BLADE) ×3 IMPLANT
ELECT REM PT RETURN 9FT ADLT (ELECTROSURGICAL) ×3
ELECTRODE REM PT RTRN 9FT ADLT (ELECTROSURGICAL) ×1 IMPLANT
GAUZE SPONGE 4X4 12PLY STRL (GAUZE/BANDAGES/DRESSINGS) ×3 IMPLANT
GLOVE SURG SIGNA 7.5 PF LTX (GLOVE) ×6 IMPLANT
GOWN STRL REUS W/ TWL LRG LVL3 (GOWN DISPOSABLE) ×2 IMPLANT
GOWN STRL REUS W/ TWL XL LVL3 (GOWN DISPOSABLE) ×2 IMPLANT
GOWN STRL REUS W/TWL LRG LVL3 (GOWN DISPOSABLE) ×6
GOWN STRL REUS W/TWL XL LVL3 (GOWN DISPOSABLE) ×6
HANDLE STAPLE ENDO GIA SHORT (STAPLE)
HEMOSTAT SURGICEL 2X14 (HEMOSTASIS) IMPLANT
KIT BASIN OR (CUSTOM PROCEDURE TRAY) ×3 IMPLANT
KIT ROOM TURNOVER OR (KITS) ×3 IMPLANT
KIT SUCTION CATH 14FR (SUCTIONS) ×3 IMPLANT
NS IRRIG 1000ML POUR BTL (IV SOLUTION) ×9 IMPLANT
PACK CHEST (CUSTOM PROCEDURE TRAY) ×3 IMPLANT
PAD ARMBOARD 7.5X6 YLW CONV (MISCELLANEOUS) ×6 IMPLANT
POUCH ENDO CATCH II 15MM (MISCELLANEOUS) IMPLANT
POUCH SPECIMEN RETRIEVAL 10MM (ENDOMECHANICALS) IMPLANT
RELOAD STAPLE 60 3.8 GOLD REG (STAPLE) IMPLANT
RELOAD STAPLER GOLD 60MM (STAPLE) ×5 IMPLANT
SCISSORS ENDO CVD 5DCS (MISCELLANEOUS) IMPLANT
SEALANT PROGEL (MISCELLANEOUS) IMPLANT
SEALANT SURG COSEAL 4ML (VASCULAR PRODUCTS) IMPLANT
SEALANT SURG COSEAL 8ML (VASCULAR PRODUCTS) IMPLANT
SOLUTION ANTI FOG 6CC (MISCELLANEOUS) ×3 IMPLANT
SPECIMEN JAR MEDIUM (MISCELLANEOUS) ×3 IMPLANT
SPONGE TONSIL 1 RF SGL (DISPOSABLE) ×3 IMPLANT
STAPLE ECHEON FLEX 60 POW ENDO (STAPLE) ×2 IMPLANT
STAPLER ENDO GIA 12 SHRT THIN (STAPLE) IMPLANT
STAPLER ENDO GIA 12MM SHORT (STAPLE) IMPLANT
STAPLER RELOAD GOLD 60MM (STAPLE) ×15
SUT PROLENE 4 0 RB 1 (SUTURE)
SUT PROLENE 4-0 RB1 .5 CRCL 36 (SUTURE) IMPLANT
SUT SILK  1 MH (SUTURE) ×2
SUT SILK 1 MH (SUTURE) ×1 IMPLANT
SUT SILK 1 TIES 10X30 (SUTURE) ×3 IMPLANT
SUT SILK 2 0 SH (SUTURE) IMPLANT
SUT SILK 2 0SH CR/8 30 (SUTURE) IMPLANT
SUT SILK 3 0 SH 30 (SUTURE) IMPLANT
SUT SILK 3 0SH CR/8 30 (SUTURE) ×2 IMPLANT
SUT VIC AB 0 CTX 27 (SUTURE) IMPLANT
SUT VIC AB 1 CTX 27 (SUTURE) ×3 IMPLANT
SUT VIC AB 1 CTX 36 (SUTURE) ×3
SUT VIC AB 1 CTX36XBRD ANBCTR (SUTURE) IMPLANT
SUT VIC AB 2-0 CT1 27 (SUTURE)
SUT VIC AB 2-0 CT1 TAPERPNT 27 (SUTURE) IMPLANT
SUT VIC AB 2-0 CTX 36 (SUTURE) ×3 IMPLANT
SUT VIC AB 3-0 MH 27 (SUTURE) IMPLANT
SUT VIC AB 3-0 SH 27 (SUTURE)
SUT VIC AB 3-0 SH 27X BRD (SUTURE) IMPLANT
SUT VIC AB 3-0 X1 27 (SUTURE) ×5 IMPLANT
SUT VICRYL 0 UR6 27IN ABS (SUTURE) ×6 IMPLANT
SUT VICRYL 2 TP 1 (SUTURE) IMPLANT
SWAB COLLECTION DEVICE MRSA (MISCELLANEOUS) IMPLANT
SYRINGE 10CC LL (SYRINGE) ×3 IMPLANT
SYSTEM SAHARA CHEST DRAIN ATS (WOUND CARE) ×3 IMPLANT
TAPE CLOTH SURG 4X10 WHT LF (GAUZE/BANDAGES/DRESSINGS) ×2 IMPLANT
TIP APPLICATOR SPRAY EXTEND 16 (VASCULAR PRODUCTS) IMPLANT
TOWEL OR 17X24 6PK STRL BLUE (TOWEL DISPOSABLE) ×3 IMPLANT
TOWEL OR 17X26 10 PK STRL BLUE (TOWEL DISPOSABLE) ×6 IMPLANT
TRAP SPECIMEN MUCOUS 40CC (MISCELLANEOUS) IMPLANT
TRAY FOLEY CATH 16FRSI W/METER (SET/KITS/TRAYS/PACK) ×3 IMPLANT
TROCAR XCEL BLADELESS 5X75MML (TROCAR) ×3 IMPLANT
TROCAR XCEL NON-BLD 5MMX100MML (ENDOMECHANICALS) IMPLANT
TUBE ANAEROBIC SPECIMEN COL (MISCELLANEOUS) IMPLANT
TUNNELER SHEATH ON-Q 11GX8 DSP (PAIN MANAGEMENT) ×2 IMPLANT
WATER STERILE IRR 1000ML POUR (IV SOLUTION) ×3 IMPLANT

## 2016-10-02 NOTE — Brief Op Note (Addendum)
10/02/2016  4:50 PM  PATIENT:  Julie Gill  75 y.o. female  PRE-OPERATIVE DIAGNOSIS:  BILATERAL LUNG NODULES  POST-OPERATIVE DIAGNOSIS:  BILATERAL LUNG NODULES  PROCEDURE:  Procedure(s): VIDEO ASSISTED THORACOSCOPY (VATS)/WEDGE RESECTION (Right) RUL PLACEMENT OF ON-Q local anesthetic catheter  SURGEON:  Surgeon(s) and Role:    * [name], MD - Primary  PHYSICIAN ASSISTANT: WAYNE GOLD PA-C  ASSISTANTS: none   ANESTHESIA:   general  EBL:  Total I/O In: 1300 [I.V.:1300] Out: 650 [Urine:600; Blood:50]  BLOOD ADMINISTERED:none  DRAINS: 1  Chest Tube(s) in the RIGHT HEMITHORAX   LOCAL MEDICATIONS USED:  MARCAINE     SPECIMEN:  Source of Specimen:  RUL WEDGE  DISPOSITION OF SPECIMEN:  PATHOLOGY  COUNTS:  YES  PLAN OF CARE: Admit to inpatient   PATIENT DISPOSITION:  PACU - hemodynamically stable.   Delay start of Pharmacological VTE agent (>24hrs) due to surgical blood loss or risk of bleeding: yes  FROZEN: Adenocarcinoma, margins negative

## 2016-10-02 NOTE — OR Nursing (Signed)
Pt transported on monitor to 3S14. During transport pt went into AFIB HR 110-140s at 1821. Dr. Roxan Hockey called when arrived to pt room and will be coming to bedside. EKG done by staff on 3S. Dr. Roxan Hockey arrived at bedside and gave orders to 3S RN.

## 2016-10-02 NOTE — Anesthesia Procedure Notes (Addendum)
Central Venous Catheter Insertion Performed by: anesthesiologist 10/02/2016 2:15 PM Patient location: Pre-op. Preanesthetic checklist: patient identified, IV checked, site marked, risks and benefits discussed, surgical consent, monitors and equipment checked, pre-op evaluation, timeout performed and anesthesia consent Position: Trendelenburg Lidocaine 1% used for infiltration Landmarks identified and Seldinger technique used Catheter size: 7.5 Fr Central line was placed.Double lumen Procedure performed using ultrasound guided technique. Attempts: 1 Following insertion, line sutured. Post procedure assessment: blood return through all ports, free fluid flow and no air. Patient tolerated the procedure well with no immediate complications.

## 2016-10-02 NOTE — Anesthesia Preprocedure Evaluation (Addendum)
Anesthesia Evaluation  Patient identified by MRN, date of birth, ID band Patient awake    Reviewed: Allergy & Precautions, NPO status , Patient's Chart, lab work & pertinent test results  Airway Mallampati: II  TM Distance: >3 FB Neck ROM: Full    Dental  (+) Teeth Intact, Dental Advisory Given   Pulmonary asthma , sleep apnea , former smoker,    Pulmonary exam normal        Cardiovascular hypertension, Pt. on medications Normal cardiovascular exam+ dysrhythmias  Rhythm:Regular Rate:Normal     Neuro/Psych    GI/Hepatic GERD  ,  Endo/Other    Renal/GU      Musculoskeletal   Abdominal   Peds  Hematology   Anesthesia Other Findings   Reproductive/Obstetrics                            Anesthesia Physical Anesthesia Plan  ASA: III  Anesthesia Plan: General   Post-op Pain Management:    Induction: Intravenous  Airway Management Planned: Double Lumen EBT  Additional Equipment: Arterial line, CVP and Ultrasound Guidance Line Placement  Intra-op Plan:   Post-operative Plan: Extubation in OR  Informed Consent: I have reviewed the patients History and Physical, chart, labs and discussed the procedure including the risks, benefits and alternatives for the proposed anesthesia with the patient or authorized representative who has indicated his/her understanding and acceptance.     Plan Discussed with: CRNA and Surgeon  Anesthesia Plan Comments:         Anesthesia Quick Evaluation

## 2016-10-02 NOTE — H&P (View-Only) (Signed)
PCP is MCNEILL,[name], MD Referring Provider is [name], MD  Chief Complaint  Patient presents with  . Lung Lesion    multiple...CT CHEST 09/09/16...BILATERAL APICES    HPI: Mrs. Schreier is sent for consultation regarding multiple lung nodules.  Mrs. Mcconathy is a 75 year old woman with a past medical history significant for atrial fibrillation status post ablation, arthritis, gastroesophageal reflux, hyperlipidemia, anxiety, diastolic dysfunction, hyperlipidemia, hypertension, and obesity. Back in July she was having trouble with left flank pain and hematuria. She had a CT to evaluate for kidney stones. She was noted to have a groundglass opacity at the base of the right lower lobe. A CT of the chest was done which showed multiple groundglass opacities bilaterally. The largest was a 2.8 cm nodule in the apex of the right lung. She had a follow-up CT done on October 10 which showed persistence of the nodules. She was also noted to have a 6 mm soft tissue component of the right apical nodule.  She had smoked approximately 5 cigarettes a day for a few years prior to quitting altogether in 1986. She denies cough, but does have wheezing when it is cold. Activities are somewhat limited by arthritis. She had an ablation for atrial fibrillation but remains on Xarelto. She has not had any significant appetite change or weight loss. She denies any unusual headaches or visual changes. She is not restricted in activities by shortness of breath.  Zubrod Score: At the time of surgery this patient's most appropriate activity status/level should be described as: '[x]'$     0    Normal activity, no symptoms '[]'$     1    Restricted in physical strenuous activity but ambulatory, able to do out light work '[]'$     2    Ambulatory and capable of self care, unable to do work activities, up and about >50 % of waking hours                              '[]'$     3    Only limited self care, in bed greater than 50% of waking  hours '[]'$     4    Completely disabled, no self care, confined to bed or chair '[]'$     5    Moribund   Past Medical History:  Diagnosis Date  . Anxiety   . Arthritis   . Atrial fibrillation (HCC)    CHADS VASC score 3. Intolerant to flecainide. s/p afib ablation 02/12/12  . Colon polyps   . Diastolic dysfunction   . GERD (gastroesophageal reflux disease)   . Hyperlipidemia   . Hypertension   . Kidney stones   . Mild sleep apnea   . Mitral regurgitation    mild  . Neuromuscular disorder ([place])   . Obesity   . Plantar fasciitis   . Restless leg syndrome   . Sinus bradycardia   . Sleep apnea     Past Surgical History:  Procedure Laterality Date  . ABDOMINAL HYSTERECTOMY  1992   BSO  . APPENDECTOMY    . atrial fibrillation ablation  02/12/12   PV[facility] by Dr Rayann Heman  . ATRIAL FIBRILLATION ABLATION N/A 02/12/2012   Procedure: ATRIAL FIBRILLATION ABLATION;  Surgeon: Thompson Grayer, MD;  Location: [facility] CATH LAB;  Service: Cardiovascular;  Laterality: N/A;  . BLADDER SUSPENSION    . [place], [place],2004, 2007, 05/06/2011   adenomas  . heels spurs  x 2  . TEE WITHOUT CARDIOVERSION  02/11/2012   Procedure: TRANSESOPHAGEAL ECHOCARDIOGRAM (TEE);  Surgeon: Larey Dresser, MD;  Location: [place];  Service: Cardiovascular;  Laterality: N/A;  . TONSILLECTOMY AND ADENOIDECTOMY    . VARICOSE VEIN SURGERY      Family History  Problem Relation Age of Onset  . Colon cancer Maternal Uncle   . Colon cancer Maternal Grandmother   . Congestive Heart Failure Mother   . Lung cancer Father   . Diabetes Brother   . Hypertension Brother     Social History Social History  Substance Use Topics  . Smoking status: Former Smoker    Types: Cigarettes    Quit date: 09/17/1985  . Smokeless tobacco: Never Used     Comment: 5 CIG /DAY  . Alcohol use No    Current Outpatient Prescriptions  Medication Sig Dispense Refill  . calcium carbonate (OS-CAL) 600 MG TABS Take 600 mg by  mouth daily.     . Cholecalciferol (VITAMIN D PO) Take 4,000 Units by mouth daily.      . clonazePAM (KLONOPIN) 0.5 MG tablet Take 0.25 mg by mouth 2 (two) times daily as needed for anxiety.     . fish oil-omega-3 fatty acids 1000 MG capsule Take 1 g by mouth 2 (two) times daily.     . hydrochlorothiazide (HYDRODIURIL) 25 MG tablet TAKE 1/2 TABLET EVERY DAY (NEED AN APPT WITH LORI SWNIOEVO) (Patient taking differently: TAKE 12.5 MG BY MOUTH EVERY DAY (NEED AN APPT WITH LORI GERHARDT)) 45 tablet 3  . lisinopril (PRINIVIL,ZESTRIL) 40 MG tablet Take 40 mg by mouth daily.    Marland Kitchen lovastatin (MEVACOR) 40 MG tablet Take 40 mg by mouth daily.     Marland Kitchen MAGNESIUM PO Take 250 mg by mouth daily.    . metoprolol tartrate (LOPRESSOR) 25 MG tablet TAKE 1/2 TABLET(12.5 MG) BY MOUTH TWICE DAILY 90 tablet 2  . Multiple Vitamins-Minerals (MULTIVITAMIN WITH MINERALS) tablet Take 1 tablet by mouth daily.      . nitroGLYCERIN (NITROSTAT) 0.4 MG SL tablet Place 1 tablet (0.4 mg total) under the tongue every 5 (five) minutes as needed for chest pain. 25 tablet 3  . pantoprazole (PROTONIX) 40 MG tablet Take 1 tablet (40 mg total) by mouth daily. 30 tablet 11  . pantoprazole (PROTONIX) 40 MG tablet     . potassium chloride SA (K-DUR,KLOR-CON) 20 MEQ tablet Take 20 mEq by mouth daily.    Marland Kitchen senna (SENOKOT) 8.6 MG tablet Take 1-2 tablets by mouth daily as needed for constipation.     . VENTOLIN HFA 108 (90 Base) MCG/ACT inhaler Inhale 2 puffs into the lungs every 6 (six) hours as needed for wheezing or shortness of breath.     . vitamin E (VITAMIN E) 400 UNIT capsule Take 800 Units by mouth daily.    Alveda Reasons 20 MG TABS tablet Take 20 mg by mouth daily.     Current Facility-Administered Medications  Medication Dose Route Frequency Provider Last Rate Last Dose  . 0.9 %  sodium chloride infusion  500 mL Intravenous Continuous Gatha Mayer, MD      . 0.9 %  sodium chloride infusion  500 mL Intravenous Continuous Gatha Mayer,  MD        Allergies  Allergen Reactions  . Codeine Other (See Comments)    Skin crawls, jittery, agitated  . Prednisone Other (See Comments)    Tachycardia, light headed, rash    Review of Systems  Constitutional: Negative for activity change, appetite change, chills, fever and unexpected weight change.  HENT: Negative for trouble swallowing and voice change.   Eyes: Negative for visual disturbance.  Respiratory: Positive for wheezing. Negative for cough and shortness of breath.   Cardiovascular: Negative for chest pain and palpitations.  Gastrointestinal: Negative for abdominal pain and blood in stool.  Genitourinary: Positive for hematuria (In July, resolved). Negative for dysuria.  Musculoskeletal: Positive for arthralgias, gait problem (Plantar fasciitis) and joint swelling.  Hematological: Negative for adenopathy. Bruises/bleeds easily (On Xarelto).  Psychiatric/Behavioral: The patient is nervous/anxious.   All other systems reviewed and are negative.   BP (!) 153/63   Pulse 64   Resp 18   Ht '5\' 3"'$  (1.6 m)   Wt 190 lb (86.2 kg)   SpO2 98% Comment: ON RA  BMI 33.66 kg/m  Physical Exam  Constitutional: She is oriented to person, place, and time. No distress.  Obese  HENT:  Head: Normocephalic and atraumatic.  Mouth/Throat: No oropharyngeal exudate.  Eyes: Conjunctivae and EOM are normal. No scleral icterus.  Neck: Neck supple. No thyromegaly present.  Cardiovascular: Normal rate, regular rhythm, normal heart sounds and intact distal pulses.  Exam reveals no gallop and no friction rub.   No murmur heard. Pulmonary/Chest: Effort normal and breath sounds normal. No respiratory distress. She has no wheezes. She has no rales.  Abdominal: Soft. She exhibits no distension. There is no tenderness.  Musculoskeletal: She exhibits no edema.  Lymphadenopathy:    She has no cervical adenopathy.  Neurological: She is alert and oriented to person, place, and time. No cranial nerve  deficit.  No focal motor deficit  Skin: Skin is warm and dry.  Vitals reviewed.    Diagnostic Tests: CT CHEST WITHOUT CONTRAST  TECHNIQUE: Multidetector CT imaging of the chest was performed following the standard protocol without IV contrast.  COMPARISON:  Chest CT 06/10/2016.  FINDINGS: Cardiovascular: Heart size is borderline enlarged. There is no significant pericardial fluid, thickening or pericardial calcification. There is aortic atherosclerosis, as well as atherosclerosis of the great vessels of the mediastinum and the coronary arteries, including calcified atherosclerotic plaque in the left main, left anterior descending, left circumflex and right coronary arteries. Calcifications of the mitral annulus.  Mediastinum/Nodes: No pathologically enlarged mediastinal or hilar lymph nodes. Please note that accurate exclusion of hilar adenopathy is limited on noncontrast CT scans. Esophagus is unremarkable in appearance. No axillary lymphadenopathy.  Lungs/Pleura: Several sub solid pulmonary nodules are again noted, most pronounced in the lung apices. Low largest of these is a 2.8 x 2.4 x 2.2 cm ground-glass attenuation nodule (axial image 20 of series 7 and coronal image 88 of series 5), which has a 6 mm solid component in the periphery of the lesion, best appreciated on coronal image 90 of series 5, where there is clear contact with the overlying pleura. Other 6 mm ground-glass attenuation nodules in the left upper lobe are noted on images 20 and 25 of series 7. 9 mm right lower lobe nodule (image 76 of series 7) is unchanged. 10 mm right lower lobe ground-glass attenuation nodule (image 101 of series 7) is also unchanged. Several other smaller sub cm ground-glass attenuation nodules are also noted and unchanged. No acute consolidative airspace disease. No pleural effusions. Extreme lung bases were incompletely imaged.  Upper Abdomen: 12 mm low-attenuation lesion  in segment 3 of the liver is incompletely characterized on today's noncontrast CT examination, but is the was previously characterized as a  simple cyst. Calcified granulomas in the liver. Aortic atherosclerosis.  Musculoskeletal: There are no aggressive appearing lytic or blastic lesions noted in the visualized portions of the skeleton.  IMPRESSION: 1. Although the previously noted sub solid pulmonary nodules appear generally unchanged compared to the prior examination, the largest of these nodules in the apex of the right upper lobe has a 6 mm solid component, and overall measures 2.8 x 2.4 x 2.2 cm. This is considered highly suspicious, and because of this, the overall appearance of the lungs is highly concerning for possible multifocal synchronous bronchogenic adenocarcinoma. PET-CT could be considered at this time, but may be falsely negative given that the solid component of the largest nodule is less than 8 mm in size. Alternatively, biopsy of this apical nodule could be considered. Surgical resection of lesions like this can also be considered, however, given the multifocality of findings on today's examination, surgical resection is not recommended for this patient. This recommendation follows the consensus statement: Guidelines for Management of Incidental Pulmonary Nodules Detected on CT Images: From the Fleischner Society 2017; Radiology 2017; 284:228-243. 2. Aortic atherosclerosis, in addition to left main and 3 vessel coronary artery disease. Assessment for potential risk factor modification, dietary therapy or pharmacologic therapy may be warranted, if clinically indicated. These results were called by telephone at the time of interpretation on 09/09/2016 at 1:23 pm to Dr. Abigail Butts MCNEILL, who verbally acknowledged these results.   Electronically Signed   By: Vinnie Langton M.D.   On: 09/09/2016 13:25 I personally reviewed the CT chest and concur with the  findings noted above  Impression: Mrs. Thoen is a 75 year old woman with multiple groundglass opacities in both lungs. The dominant GGO is a 2.8 x 2.4 x 2.2 cm nodule in the apex of the right lung. There is a likely 6 mm soft tissue component. This is highly suspicious for multifocal adenocarcinoma in situ. Infectious and inflammatory etiologies are also a possibility. She does not have any travel or exposure history that is pertinent.  She has a very light smoking history and she quit over 30 years ago. So I doubt this is in anyway smoking related.  There are 2 different ways we can go with this nodule. One would be just to proceed with VATS and wedge resection for definitive diagnosis and treatment. The other option would be to attempt to biopsy at first. The primary reason for doing that would be that if it turns out to be infectious or inflammatory she could avoid the need for surgery altogether. It would have to be a definitive diagnosis to rule out the possibility of adenocarcinoma in situ. I do think this is approachable with a navigational bronchoscopy.  I discussed these various options with the patient and her family. They understand that either approach is valid. After discussing the pros and cons of each of those approaches we decided to proceed with navigational bronchoscopy for biopsy. If that is not definitive for that shows cancer then a wedge resection would be the treatment of choice. An alternative to wedge resection would be stereotactic radiation.  I described the nature of the procedure to her this would be done in the operating room under general anesthesia. The procedure would be done endoscopically with no incisions. We will plan to do it on an outpatient basis. I reviewed the indications, risks, benefits, and alternatives. She understands the possibility of a nondiagnostic biopsy. She understands risks include those associated with general anesthesia including MI, stroke, DVT,  PE,  as well as those directly associated with the biopsy including bleeding and pneumothorax. She accepts the risk and agrees to proceed.  She will need to have her Xarelto held for 2 days prior to biopsy. I will clear that with Dr. Rayann Heman.  Plan: Electromagnetic navigational bronchoscopy on Thursday, 10/02/2016  [name], MD [facility] [phone]

## 2016-10-02 NOTE — Anesthesia Procedure Notes (Signed)
Procedure Name: Intubation Date/Time: 10/02/2016 3:19 PM Performed by: Sampson Si E Pre-anesthesia Checklist: Patient identified, Emergency Drugs available, Suction available, Patient being monitored and Timeout performed Patient Re-evaluated:Patient Re-evaluated prior to inductionOxygen Delivery Method: Circle system utilized Preoxygenation: Pre-oxygenation with 100% oxygen Intubation Type: IV induction Ventilation: Mask ventilation without difficulty Laryngoscope Size: Mac and 3 Grade View: Grade I Endobronchial tube: Left and Double lumen EBT and 37 Fr Number of attempts: 1 Airway Equipment and Method: Stylet and Fiberoptic brochoscope Placement Confirmation: ETT inserted through vocal cords under direct vision,  positive ETCO2 and breath sounds checked- equal and bilateral Secured at: 29 cm Tube secured with: Tape Dental Injury: Teeth and Oropharynx as per pre-operative assessment

## 2016-10-02 NOTE — Interval H&P Note (Signed)
History and Physical Interval Note:  See in addition note from 10/24 which has discussion of risks  10/02/2016 1:30 PM  Julie Gill  has presented today for surgery, with the diagnosis of BILATERAL LUNG NODULES  The various methods of treatment have been discussed with the patient and family. After consideration of risks, benefits and other options for treatment, the patient has consented to  Procedure(s): VIDEO ASSISTED THORACOSCOPY (VATS)/WEDGE RESECTION (Right) as a surgical intervention .  The patient's history has been reviewed, patient examined, no change in status, stable for surgery.  I have reviewed the patient's chart and labs.  Questions were answered to the patient's satisfaction.     [name]

## 2016-10-02 NOTE — Transfer of Care (Signed)
Immediate Anesthesia Transfer of Care Note  Patient: Julie Gill  Procedure(s) Performed: Procedure(s): VIDEO ASSISTED THORACOSCOPY (VATS)/WEDGE RESECTION (Right)  Patient Location: PACU  Anesthesia Type:General  Level of Consciousness: patient cooperative, lethargic and responds to stimulation  Airway & Oxygen Therapy: Patient Spontanous Breathing and Patient connected to nasal cannula oxygen  Post-op Assessment: Report given to RN  Post vital signs: Reviewed and stable  Last Vitals:  Vitals:   10/02/16 1205  BP: (!) 135/52  Pulse: (!) 57  Resp: 18  Temp: 36.7 C    Last Pain:  Vitals:   10/02/16 1205  TempSrc: Oral         Complications: No apparent anesthesia complications

## 2016-10-02 NOTE — Anesthesia Postprocedure Evaluation (Signed)
Anesthesia Post Note  Patient: Julie Gill  Procedure(s) Performed: Procedure(s) (LRB): VIDEO ASSISTED THORACOSCOPY (VATS)/WEDGE RESECTION (Right)  Patient location during evaluation: PACU Anesthesia Type: General Level of consciousness: awake and alert Pain management: pain level controlled Vital Signs Assessment: post-procedure vital signs reviewed and stable Respiratory status: spontaneous breathing, nonlabored ventilation, respiratory function stable and patient connected to nasal cannula oxygen Cardiovascular status: blood pressure returned to baseline and stable Postop Assessment: no signs of nausea or vomiting Anesthetic complications: no    Last Vitals:  Vitals:   10/02/16 1740 10/02/16 1810  BP: (!) 141/68   Pulse: 70 67  Resp: 16 16  Temp:      Last Pain:  Vitals:   10/02/16 1810  TempSrc:   PainSc: Loraine DAVID

## 2016-10-03 ENCOUNTER — Inpatient Hospital Stay (HOSPITAL_COMMUNITY): Payer: Commercial Managed Care - HMO

## 2016-10-03 ENCOUNTER — Encounter (HOSPITAL_COMMUNITY): Payer: Self-pay | Admitting: Thoracic Surgery (Cardiothoracic Vascular Surgery)

## 2016-10-03 DIAGNOSIS — I480 Paroxysmal atrial fibrillation: Secondary | ICD-10-CM

## 2016-10-03 DIAGNOSIS — C3491 Malignant neoplasm of unspecified part of right bronchus or lung: Secondary | ICD-10-CM

## 2016-10-03 LAB — BLOOD GAS, ARTERIAL
Acid-Base Excess: 1.8 mmol/L (ref 0.0–2.0)
Bicarbonate: 26.6 mmol/L (ref 20.0–28.0)
Drawn by: 345601
FIO2: 28
O2 Saturation: 97.8 %
Patient temperature: 98.6
pCO2 arterial: 46.8 mmHg (ref 32.0–48.0)
pH, Arterial: 7.373 (ref 7.350–7.450)
pO2, Arterial: 104 mmHg (ref 83.0–108.0)

## 2016-10-03 LAB — CBC
HCT: 33.5 % — ABNORMAL LOW (ref 36.0–46.0)
Hemoglobin: 10.6 g/dL — ABNORMAL LOW (ref 12.0–15.0)
MCH: 28.9 pg (ref 26.0–34.0)
MCHC: 31.6 g/dL (ref 30.0–36.0)
MCV: 91.3 fL (ref 78.0–100.0)
Platelets: 219 10*3/uL (ref 150–400)
RBC: 3.67 MIL/uL — ABNORMAL LOW (ref 3.87–5.11)
RDW: 13.4 % (ref 11.5–15.5)
WBC: 12 10*3/uL — ABNORMAL HIGH (ref 4.0–10.5)

## 2016-10-03 LAB — BASIC METABOLIC PANEL
Anion gap: 8 (ref 5–15)
BUN: 11 mg/dL (ref 6–20)
CO2: 26 mmol/L (ref 22–32)
Calcium: 8.3 mg/dL — ABNORMAL LOW (ref 8.9–10.3)
Chloride: 103 mmol/L (ref 101–111)
Creatinine, Ser: 0.82 mg/dL (ref 0.44–1.00)
GFR calc Af Amer: >60 mL/min (ref >60)
GFR calc non Af Amer: >60 mL/min (ref >60)
Glucose, Bld: 131 mg/dL — ABNORMAL HIGH (ref 65–99)
Potassium: 4.1 mmol/L (ref 3.5–5.1)
Sodium: 137 mmol/L (ref 135–145)

## 2016-10-03 MED ORDER — OXYBUTYNIN CHLORIDE 5 MG PO TABS
5.0000 mg | ORAL_TABLET | Freq: Three times a day (TID) | ORAL | Status: DC | PRN
  Administered 2016-10-03: 5 mg via ORAL
  Filled 2016-10-03 (×3): qty 1

## 2016-10-03 MED ORDER — CALCIUM CARBONATE 1250 (500 CA) MG PO TABS
600.0000 mg | ORAL_TABLET | Freq: Every day | ORAL | Status: DC
  Administered 2016-10-03: 625 mg via ORAL
  Administered 2016-10-04: 500 mg via ORAL
  Administered 2016-10-05: 1 via ORAL
  Filled 2016-10-03 (×6): qty 1

## 2016-10-03 MED ORDER — DIPHENHYDRAMINE HCL 12.5 MG/5ML PO ELIX: 12.5000 mg | ORAL_SOLUTION | Freq: Four times a day (QID) | ORAL | Status: DC | PRN

## 2016-10-03 MED ORDER — SODIUM CHLORIDE 0.9% FLUSH: 9.0000 mL | INTRAVENOUS | Status: DC | PRN

## 2016-10-03 MED ORDER — DIPHENHYDRAMINE HCL 50 MG/ML IJ SOLN: 12.5000 mg | Freq: Four times a day (QID) | INTRAMUSCULAR | Status: DC | PRN

## 2016-10-03 MED ORDER — NALOXONE HCL 0.4 MG/ML IJ SOLN: 0.4000 mg | INTRAMUSCULAR | Status: DC | PRN

## 2016-10-03 MED ORDER — FENTANYL 40 MCG/ML IV SOLN
INTRAVENOUS | Status: DC
  Administered 2016-10-03: 70 ug via INTRAVENOUS
  Administered 2016-10-03: 45 ug via INTRAVENOUS
  Administered 2016-10-03: 75 ug via INTRAVENOUS
  Administered 2016-10-04: 0 ug via INTRAVENOUS
  Administered 2016-10-04 (×2): 30 ug via INTRAVENOUS
  Administered 2016-10-04: 60 ug via INTRAVENOUS
  Administered 2016-10-04 (×2): 45 ug via INTRAVENOUS
  Administered 2016-10-04: 0 ug via INTRAVENOUS
  Administered 2016-10-05: 45 ug via INTRAVENOUS

## 2016-10-03 MED ORDER — RIVAROXABAN 20 MG PO TABS
20.0000 mg | ORAL_TABLET | Freq: Every day | ORAL | Status: DC
  Administered 2016-10-04 – 2016-10-06 (×3): 20 mg via ORAL
  Filled 2016-10-03 (×3): qty 1

## 2016-10-03 NOTE — Progress Notes (Signed)
Patient converted to SB, Cardizem gtt stopped. Notified Dr. Nils Pyle.  BP stable.  Will assess need to start PO dose in the morning per MD.  Will continue to monitor patient.

## 2016-10-03 NOTE — Progress Notes (Addendum)
      [address]       [place] [address]             [phone]       1 Day Post-Op Procedure(s) (LRB): VIDEO ASSISTED THORACOSCOPY (VATS)/WEDGE RESECTION (Right)  Subjective: Patient sitting in chair. She has incisional pain and muscle spasm.  Objective: Vital signs in last 24 hours: Temp:  [97.2 F (36.2 C)-98.2 F (36.8 C)] 98.1 F (36.7 C) (11/03 0700) Pulse Rate:  [48-123] 48 (11/03 0700) Cardiac Rhythm: Sinus bradycardia (11/03 0400) Resp:  [13-23] 16 (11/03 0700) BP: (95-142)/(43-74) 95/43 (11/03 0700) SpO2:  [90 %-100 %] 92 % (11/03 0700) Arterial Line BP: (70-169)/(31-72) 136/55 (11/03 0500) Weight:  [196 lb (88.9 kg)-207 lb 7.3 oz (94.1 kg)] 207 lb 7.3 oz (94.1 kg) (11/03 0329)      Intake/Output from previous day: 11/02 0701 - 11/03 0700 In: 3122.5 [P.O.:720; I.V.:2352.5; IV Piggyback:50] Out: 1278 [Urine:1200; Blood:50; Chest Tube:28]   Physical Exam:  Cardiovascular: RRR Pulmonary: Clear to auscultation on left and slightly diminished right base Abdomen: Soft, non tender, bowel sounds present. Extremities: Mild bilateral lower extremity edema. Wounds: Dressing is clean and dry.   Chest Tube: to suction, no air leak.  Lab Results: CBC: Recent Labs  10/03/16 0405  WBC 12.0*  HGB 10.6*  HCT 33.5*  PLT 219   BMET:  Recent Labs  10/02/16 1208 10/03/16 0405  NA 140 137  K 3.8 4.1  CL 105 103  CO2 26 26  GLUCOSE 94 131*  BUN 12 11  CREATININE 0.80 0.82  CALCIUM 9.4 8.3*    PT/INR: No results for input(s): LABPROT, INR in the last 72 hours. ABG:  INR: Will add last result for INR, ABG once components are confirmed Will add last 4 CBG results once components are confirmed  Assessment/Plan:  1. CV -She has a history of a afib  went into a fib yesterday. Put on Cardizem drip. She is  SR/SB with HR in the 50's and Cardizem drip was stopped earlier this am. On  Lopressor 12.5 mg bid. Will put parameters on Lopressor.  Will discuss with Dr. Roxan Hockey about changing to oral Cardizem as is bradycardic this am. On Xarelto pre op and will restart soon. 2.  Pulmonary - Chest tube with 28 cc of output since surgery? Chest tube is to suction and there is no air leak. CXR this am appears to show  subcutaneous emphysema right lateral chest wall, questionable trace right apical ptx. Hope to place chest tube to water seal soon. On 2 liters of oxygen via [place]. Will wean over next few days. Encouarge incentive spirometer. 3. Remove a line 4. Tylenol and heating pad PRN per patient request for muscle spasms 5. Advance diet and decrease IVF 6. Anemia-H and H 10.6 and 33.5. Will recheck in am   ZIMMERMAN,DONIELLE MPA-C 10/03/2016,7:44 AM

## 2016-10-03 NOTE — Progress Notes (Signed)
1 Day Post-Op Procedure(s) (LRB): VIDEO ASSISTED THORACOSCOPY (VATS)/WEDGE RESECTION (Right) Subjective: C/o pain  Objective: Vital signs in last 24 hours: Temp:  [97.2 F (36.2 C)-98.2 F (36.8 C)] 98.1 F (36.7 C) (11/03 0700) Pulse Rate:  [48-123] 48 (11/03 0700) Cardiac Rhythm: Sinus bradycardia (11/03 0753) Resp:  [13-23] 14 (11/03 0813) BP: (95-142)/(43-74) 95/43 (11/03 0700) SpO2:  [90 %-100 %] 95 % (11/03 0813) Arterial Line BP: (70-169)/(31-72) 136/55 (11/03 0500) Weight:  [196 lb (88.9 kg)-207 lb 7.3 oz (94.1 kg)] 207 lb 7.3 oz (94.1 kg) (11/03 0329)  Hemodynamic parameters for last 24 hours:    Intake/Output from previous day: 11/02 0701 - 11/03 0700 In: 3122.5 [P.O.:720; I.V.:2352.5; IV Piggyback:50] Out: 1278 [Urine:1200; Blood:50; Chest Tube:28] Intake/Output this shift: No intake/output data recorded.  General appearance: alert, cooperative and no distress Neurologic: intact Heart: brady, regular Lungs: diminished breath sounds bibasilar Abdomen: mildly distended, nontender no air leak, minimal drainage from CT  Lab Results:  Recent Labs  10/03/16 0405  WBC 12.0*  HGB 10.6*  HCT 33.5*  PLT 219   BMET:  Recent Labs  10/02/16 1208 10/03/16 0405  NA 140 137  K 3.8 4.1  CL 105 103  CO2 26 26  GLUCOSE 94 131*  BUN 12 11  CREATININE 0.80 0.82  CALCIUM 9.4 8.3*    PT/INR: No results for input(s): LABPROT, INR in the last 72 hours. ABG    Component Value Date/Time   PHART 7.373 10/03/2016 0400   HCO3 26.6 10/03/2016 0400   O2SAT 97.8 10/03/2016 0400   CBG (last 3)  No results for input(s): GLUCAP in the last 72 hours.  Assessment/Plan: S/P Procedure(s) (LRB): VIDEO ASSISTED THORACOSCOPY (VATS)/WEDGE RESECTION (Right) -POD # 1 wedge resection No air leak- CT to water seal Pain control- On-Q, PCA (change to full dose), acetaminophen Atrial fib last night, converted to SB with diltiazem drip- continue lopressor, will ask Cardiology to  see Restart Xarelto tomorrow Advance diet as tolerated Ambulate Anemia secondary to ABL- mild, follow   LOS: 1 day    [name] 10/03/2016

## 2016-10-03 NOTE — Consult Note (Signed)
Patient ID: Julie Gill MRN: [id number], DOB/AGE: 73-28-1942   Admit date: 10/02/2016   Reason for Consult: Post-operative Atrial Fibrillation  Requesting MD: Dr. Roxan Hockey    Primary Physician: [name], MD Primary Cardiologist: Dr. Rayann Heman   Pt. Profile:  75 y/o female with h/o atrial fibrillation s/p ablation in 2013, chronic anticoagulation therapy with Xarelto, OSA, bradycardia, HTN, HLD and bilateral pulmonary nodules admitted for VATS + wedge resection with Dr. Roxan Hockey given suspicion for possible adenocarcinoma. Post procedural recovery complicated by recurrent atrial fibrillation.   Problem List  Past Medical History:  Diagnosis Date  . Anxiety   . Arthritis   . Asthma    dx 10 yrs ago  . Atrial fibrillation (HCC)    CHADS VASC score 3. Intolerant to flecainide. s/p afib ablation 02/12/12  . Colon polyps   . Diastolic dysfunction   . Dysrhythmia    a fib   dx 2014  . GERD (gastroesophageal reflux disease)   . History of kidney infection     last one was approx 2009  . Hyperlipidemia   . Hypertension   . Mild sleep apnea    wears NO equipment  . Mitral regurgitation    mild  . Neuromuscular disorder ([place])   . Obesity   . Plantar fasciitis   . Restless leg syndrome   . Sinus bradycardia   . Sleep apnea   . Stress bladder incontinence, female    WEARS SMALL PADS    Past Surgical History:  Procedure Laterality Date  . ABDOMINAL HYSTERECTOMY  1992   BSO  . APPENDECTOMY    . atrial fibrillation ablation  02/12/12   PVI by Dr Rayann Heman  . ATRIAL FIBRILLATION ABLATION N/A 02/12/2012   Procedure: ATRIAL FIBRILLATION ABLATION;  Surgeon: Thompson Grayer, MD;  Location: [facility] CATH LAB;  Service: Cardiovascular;  Laterality: N/A;  . BLADDER SUSPENSION    . [place], 200,2004, 2007, 05/06/2011   adenomas  . heels spurs     x 2  . TEE WITHOUT CARDIOVERSION  02/11/2012   Procedure: TRANSESOPHAGEAL ECHOCARDIOGRAM (TEE);  Surgeon: Larey Dresser, MD;  Location: [place];  Service: Cardiovascular;  Laterality: N/A;  . TONSILLECTOMY AND ADENOIDECTOMY    . VARICOSE VEIN SURGERY    . VIDEO ASSISTED THORACOSCOPY (VATS)/WEDGE RESECTION Right 10/02/2016   Procedure: VIDEO ASSISTED THORACOSCOPY (VATS)/WEDGE RESECTION;  Surgeon: [name], MD;  Location: [place];  Service: Thoracic;  Laterality: Right;     Allergies  Allergies  Allergen Reactions  . Codeine Other (See Comments)    Skin crawls, jittery, agitated  . Prednisone Other (See Comments)    Tachycardia, light headed, rash    HPI  The patient is a 75 y/o female, followed by Dr. Rayann Heman, with a h/o atrial fibrillation s/p ablation in 2013, on chronic anticoagulation with Xarelto, h/o OSA, bradycardia, HTN, HLD and GERD. She has no known CAD. She had a negative Myoview in December 2012. Her most recent 2D echo was in July of this year which showed normal LVEF at 55-60%. No WMA.   She also has h/o lung nodules, suspicious for multifocal adenocarcinoma. She was referred to Dr. Roxan Hockey and was admitted for electromagnetic navigational bronchoscopy with wedge resection, performed on 10/02/16. Her Xarelto was held for 2 days prior to procedure. Post-operative diagnosis is well-differentiated adenocarcinoma. Post procedural recovery complicated by atrial fibrillation. She was initially placed on a IV Cardizem drip, however developed bradycardia and Cardizem was discontinued. She  is currently on Lopressor 12.5 mg BID, with hold parameters. Her Xarelto is scheduled to be restarted tonight. Cardiology consulted for further recommendations regarding management of her atrial fibrillation.   It now appears that she is in SR/ sinus brady with HR in the upper 50s/ low 60s. Her RN reports that she has been in NSR all day today. She has not received any beta blocker today given bradycardia and soft BP.    Home Medications  Prior to Admission medications   Medication Sig Start  Date End Date Taking? Authorizing Provider  calcium carbonate (OS-CAL) 600 MG TABS Take 600 mg by mouth daily.    Yes Historical Provider, MD  Cholecalciferol (VITAMIN D PO) Take 4,000 Units by mouth daily.     Yes Historical Provider, MD  clonazePAM (KLONOPIN) 0.5 MG tablet Take 0.25 mg by mouth 2 (two) times daily as needed for anxiety.    Yes Historical Provider, MD  fish oil-omega-3 fatty acids 1000 MG capsule Take 1 g by mouth 2 (two) times daily.  11/20/11  Yes Deboraha Sprang, MD  hydrochlorothiazide (HYDRODIURIL) 25 MG tablet TAKE 1/2 TABLET EVERY DAY (NEED AN APPT WITH LORI CBULAGTX) Patient taking differently: TAKE 12.5 MG BY MOUTH EVERY DAY (NEED AN APPT WITH LORI GERHARDT) 04/11/16  Yes Thompson Grayer, MD  lisinopril (PRINIVIL,ZESTRIL) 40 MG tablet Take 40 mg by mouth daily.   Yes Historical Provider, MD  lovastatin (MEVACOR) 40 MG tablet Take 80 mg by mouth daily.  09/11/12  Yes Historical Provider, MD  MAGNESIUM PO Take 250 mg by mouth daily.   Yes Historical Provider, MD  metoprolol tartrate (LOPRESSOR) 25 MG tablet TAKE 1/2 TABLET(12.5 MG) BY MOUTH TWICE DAILY 08/08/16  Yes Thompson Grayer, MD  Multiple Vitamins-Minerals (MULTIVITAMIN WITH MINERALS) tablet Take 1 tablet by mouth daily.     Yes Historical Provider, MD  nitroGLYCERIN (NITROSTAT) 0.4 MG SL tablet Place 1 tablet (0.4 mg total) under the tongue every 5 (five) minutes as needed for chest pain. 10/11/12  Yes Burtis Junes, NP  potassium chloride SA (K-DUR,KLOR-CON) 20 MEQ tablet Take 20 mEq by mouth daily.   Yes Historical Provider, MD  senna (SENOKOT) 8.6 MG tablet Take 1 tablet by mouth daily.    Yes Historical Provider, MD  VENTOLIN HFA 108 (90 Base) MCG/ACT inhaler Inhale 2 puffs into the lungs every 6 (six) hours as needed for wheezing or shortness of breath.  05/28/16  Yes Historical Provider, MD  vitamin E (VITAMIN E) 400 UNIT capsule Take 400 Units by mouth daily.    Yes Historical Provider, MD  XARELTO 20 MG TABS tablet  Take 20 mg by mouth daily with supper.  04/11/16  Yes Historical Provider, MD  pantoprazole (PROTONIX) 40 MG tablet Take 1 tablet (40 mg total) by mouth daily. Patient taking differently: Take 40 mg by mouth every evening.  03/23/12 09/17/16  Thompson Grayer, MD   Scheduled Meds: . acetaminophen  1,000 mg Oral Q6H   Or  . acetaminophen (TYLENOL) oral liquid 160 mg/5 mL  1,000 mg Oral Q6H  . bisacodyl  10 mg Oral Daily  . calcium carbonate  625 mg Oral Daily  . fentaNYL   Intravenous Q4H  . magnesium oxide  200 mg Oral Daily  . metoprolol tartrate  12.5 mg Oral BID  . pantoprazole  40 mg Oral QPM  . pravastatin  40 mg Oral q1800  . [START ON 10/04/2016] rivaroxaban  20 mg Oral Q supper  . senna-docusate  1 tablet Oral QHS   Continuous Infusions: . sodium chloride 50 mL/hr at 10/03/16 0758   PRN Meds:.albuterol, clonazePAM, diphenhydrAMINE **OR** diphenhydrAMINE, naloxone **AND** sodium chloride flush, nitroGLYCERIN, ondansetron (ZOFRAN) IV, oxybutynin, oxyCODONE, potassium chloride, traMADol  Family History  Family History  Problem Relation Age of Onset  . Colon cancer Maternal Uncle   . Colon cancer Maternal Grandmother   . Congestive Heart Failure Mother   . Lung cancer Father   . Diabetes Brother   . Hypertension Brother     Social History  Social History   Social History  . Marital status: Married    Spouse name: N/A  . Number of children: 2  . Years of education: N/A   Occupational History  . retired    Social History Main Topics  . Smoking status: Former Smoker    Types: Cigarettes    Quit date: 09/17/1985  . Smokeless tobacco: Never Used     Comment: 5 CIG /DAY  . Alcohol use No  . Drug use: No  . Sexual activity: Yes    Birth control/ protection: Post-menopausal   Other Topics Concern  . Not on file   Social History Narrative   Pt lives in [place] with spouse.  2 grown children.   Retired Multimedia programmer.     Review of Systems General:  No chills,  fever, night sweats or weight changes.  Cardiovascular:  No chest pain, dyspnea on exertion, edema, orthopnea, palpitations, paroxysmal nocturnal dyspnea. Dermatological: No rash, lesions/masses Respiratory: No cough, dyspnea Urologic: No hematuria, dysuria Abdominal:   No nausea, vomiting, diarrhea, bright red blood per rectum, melena, or hematemesis Neurologic:  No visual changes, wkns, changes in mental status. All other systems reviewed and are otherwise negative except as noted above.  Physical Exam  Blood pressure (!) 95/43, pulse (!) 48, temperature 98.1 F (36.7 C), temperature source Oral, resp. rate 18, height '5\' 3"'$  (1.6 m), weight 207 lb 7.3 oz (94.1 kg), SpO2 97 %.  General: Pleasant, NAD Psych: Normal affect. Neuro: Alert and oriented X 3. Moves all extremities spontaneously. HEENT: Normal  Neck: Supple without bruits or JVD. Lungs:  Resp regular and unlabored, CTA. Heart: RRR no s3, s4, or murmurs. Abdomen: Soft, non-tender, non-distended, BS + x 4.  Extremities: No clubbing, cyanosis or edema. DP/PT/Radials 2+ and equal bilaterally.  Labs  Troponin (Point of Care Test) No results for input(s): TROPIPOC in the last 72 hours. No results for input(s): CKTOTAL, CKMB, TROPONINI in the last 72 hours. Lab Results  Component Value Date   WBC 12.0 (H) 10/03/2016   HGB 10.6 (L) 10/03/2016   HCT 33.5 (L) 10/03/2016   MCV 91.3 10/03/2016   PLT 219 10/03/2016    Recent Labs Lab 10/02/16 1208 10/03/16 0405  NA 140 137  K 3.8 4.1  CL 105 103  CO2 26 26  BUN 12 11  CREATININE 0.80 0.82  CALCIUM 9.4 8.3*  PROT 6.0*  --   BILITOT 0.5  --   ALKPHOS 78  --   ALT 25  --   AST 26  --   GLUCOSE 94 131*   No results found for: CHOL, HDL, LDLCALC, TRIG No results found for: DDIMER   Radiology/Studies  Dg Chest 2 View  Result Date: 10/02/2016 CLINICAL DATA:  Pre-op respiratory exam for RIGHT VIDEO ASSISTED THORACOSCOPY (VATS) and /WEDGE RESECTION for Bilateral Lung  Nodules.Dry cough. Hx HTN, A-Fib with Ablation. Asthma. Social smoker for 5-6 yrs - quit completely in 1986. EXAM:  CHEST  2 VIEW COMPARISON:  05/28/2016 FINDINGS: The cardiac silhouette is normal in size and configuration. No mediastinal or hilar masses. No evidence of adenopathy. Lungs are mildly hyperexpanded but clear. No pleural effusion or pneumothorax. Skeletal structures are intact. IMPRESSION: No active cardiopulmonary disease. Electronically Signed   By: Lajean Manes M.D.   On: 10/02/2016 11:57   Ct Chest Wo Contrast  Result Date: 09/09/2016 CLINICAL DATA:  75 year old female with history of pulmonary nodules. Followup study. EXAM: CT CHEST WITHOUT CONTRAST TECHNIQUE: Multidetector CT imaging of the chest was performed following the standard protocol without IV contrast. COMPARISON:  Chest CT 06/10/2016. FINDINGS: Cardiovascular: Heart size is borderline enlarged. There is no significant pericardial fluid, thickening or pericardial calcification. There is aortic atherosclerosis, as well as atherosclerosis of the great vessels of the mediastinum and the coronary arteries, including calcified atherosclerotic plaque in the left main, left anterior descending, left circumflex and right coronary arteries. Calcifications of the mitral annulus. Mediastinum/Nodes: No pathologically enlarged mediastinal or hilar lymph nodes. Please note that accurate exclusion of hilar adenopathy is limited on noncontrast CT scans. Esophagus is unremarkable in appearance. No axillary lymphadenopathy. Lungs/Pleura: Several sub solid pulmonary nodules are again noted, most pronounced in the lung apices. Low largest of these is a 2.8 x 2.4 x 2.2 cm ground-glass attenuation nodule (axial image 20 of series 7 and coronal image 88 of series 5), which has a 6 mm solid component in the periphery of the lesion, best appreciated on coronal image 90 of series 5, where there is clear contact with the overlying pleura. Other 6 mm  ground-glass attenuation nodules in the left upper lobe are noted on images 20 and 25 of series 7. 9 mm right lower lobe nodule (image 76 of series 7) is unchanged. 10 mm right lower lobe ground-glass attenuation nodule (image 101 of series 7) is also unchanged. Several other smaller sub cm ground-glass attenuation nodules are also noted and unchanged. No acute consolidative airspace disease. No pleural effusions. Extreme lung bases were incompletely imaged. Upper Abdomen: 12 mm low-attenuation lesion in segment 3 of the liver is incompletely characterized on today's noncontrast CT examination, but is the was previously characterized as a simple cyst. Calcified granulomas in the liver. Aortic atherosclerosis. Musculoskeletal: There are no aggressive appearing lytic or blastic lesions noted in the visualized portions of the skeleton. IMPRESSION: 1. Although the previously noted sub solid pulmonary nodules appear generally unchanged compared to the prior examination, the largest of these nodules in the apex of the right upper lobe has a 6 mm solid component, and overall measures 2.8 x 2.4 x 2.2 cm. This is considered highly suspicious, and because of this, the overall appearance of the lungs is highly concerning for possible multifocal synchronous bronchogenic adenocarcinoma. PET-CT could be considered at this time, but may be falsely negative given that the solid component of the largest nodule is less than 8 mm in size. Alternatively, biopsy of this apical nodule could be considered. Surgical resection of lesions like this can also be considered, however, given the multifocality of findings on today's examination, surgical resection is not recommended for this patient. This recommendation follows the consensus statement: Guidelines for Management of Incidental Pulmonary Nodules Detected on CT Images: From the Fleischner Society 2017; Radiology 2017; 284:228-243. 2. Aortic atherosclerosis, in addition to left main  and 3 vessel coronary artery disease. Assessment for potential risk factor modification, dietary therapy or pharmacologic therapy may be warranted, if clinically indicated. These results were called by  telephone at the time of interpretation on 09/09/2016 at 1:23 pm to Dr. Abigail Butts MCNEILL, who verbally acknowledged these results. Electronically Signed   By: Vinnie Langton M.D.   On: 09/09/2016 13:25   Dg Chest Port 1 View  Result Date: 10/03/2016 CLINICAL DATA:  Postoperative for lung carcinoma. Chest tube in place EXAM: PORTABLE CHEST 1 VIEW COMPARISON:  October 02, 2016 FINDINGS: There is postoperative change on the right. There is soft tissue air on the right but no evident pneumothorax. Chest tube position is unchanged. Central catheter tip is in the superior vena cava. There is new atelectasis in the right base. The lungs elsewhere are clear. Heart is mildly prominent with pulmonary vascularity within normal limits. No adenopathy. There is atherosclerotic calcification in the aorta. 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 IMPRESSION: Tube and catheter positions as described without pneumothorax. There is soft tissue air on the right. There is new atelectasis right base. Lungs elsewhere clear. Stable cardiac silhouette. There is aortic atherosclerosis. Electronically Signed   By: Lowella Grip III M.D.   On: 10/03/2016 08:01   Dg Chest Port 1 View  Result Date: 10/02/2016 CLINICAL DATA:  Follow-up lung adenocarcinoma.  Initial encounter. EXAM: PORTABLE CHEST 1 VIEW COMPARISON:  Chest radiograph performed earlier today at 11:39 a.m. FINDINGS: Vascular congestion is noted. Focal right upper lobe opacity is seen, status post video-assisted thoracoscopy and wedge resection. A small left pleural effusion is suggested. A right apical chest tube is noted. The patient's right IJ line is noted ending about the mid to distal SVC. The cardiomediastinal silhouette is mildly enlarged. No acute  osseous abnormalities are seen. IMPRESSION: 1. Focal right upper lobe opacity, status post video-assisted thoracoscopy and wedge resection. Small left pleural effusion suggested. 2. Vascular congestion and mild cardiomegaly. Electronically Signed   By: Garald Balding M.D.   On: 10/02/2016 18:20    ECG  Atrial fibrillation w/ RVR   ASSESSMENT AND PLAN  Active Problems:   Adenocarcinoma of lung ([place])  1. Atrial Fibrillation w/ RVR: patient with h/o atrial fibrillation s/p afib ablation per Dr. Rayann Heman in 2013, on chronic anticoagulation w/ Xarelto. Per office notes, she was doing well w/o much recurrence of her arrhythmia. Now s/p VATS procedure for pulmonary nodules with post operative afib w/ RVR. Chest tube in place. Unable to tolerate IV Cardizem given bradycardia. Currently on PO metoprolol, low dose at 12.5 mg BID K is stable. Mild anemia is present with Hgb at 10.6. She has converted to NSR/SB. Asymptomatic at present. Continue low dose BB, if HR and BP allows. Resume Xarelto, once ok per Dr. Roxan Hockey. Continue to monitor on telemetry.   2. Bilateral Pulmonary Nodules: s/p VATS w/ wedge resection. Post-operative diagnosis is adenocarcinoma.    Signed, Lyda Jester, PA-C 10/03/2016, 2:11 PM

## 2016-10-03 NOTE — Op Note (Signed)
NAME:  LEVANWilmer, [place]                ACCOUNT NO.:  [id number]  MEDICAL RECORD NO.:  [id number]  LOCATION:  [id number]                        FACILITY:  [facility]  PHYSICIAN:  Revonda Standard. Roxan Hockey, M.D.DATE OF BIRTH:  August 20, 1941  DATE OF PROCEDURE:  10/02/2016 DATE OF DISCHARGE:                              OPERATIVE REPORT   PREOPERATIVE DIAGNOSIS:  Multiple ground-glass opacities with dominant nodule in right upper lobe.  POSTOPERATIVE DIAGNOSIS:  Well-differentiated adenocarcinoma.  PROCEDURE:   Right video-assisted thoracoscopy,  Wedge resection right upper lobe.   On-Q local anesthetic catheter placement.  SURGEON:  Revonda Standard. Roxan Hockey, M.D.  ASSISTANT:  John Giovanni, P.A.-C.  ANESTHESIA:  General.  FINDINGS:  Nodule in the right upper lobe vaguely palpable, resected with > 1 cm gross margin.  Frozen section showed adenocarcinoma with clear stapled margin.  No easily accessible lymph nodes.  CLINICAL NOTE:  Julie Gill is a 75 year old woman, who was being evaluated for left flank pain in July.  A CT showed a ground-glass opacity at the base of the right lower lobe.  A chest CT was done and showed multiple ground-glass opacities bilaterally, the largest was a 2.8 cm nodule in the apex of the right lung.  There was a questionable small solid component to the lesion.  We discussed resection versus bronchoscopy for sampling.  She strongly preferred to proceed with resection for definitive diagnosis and treatment.  The indications, risks, benefits, and alternatives were discussed in detail with the patient.  She understood and accepted the risks and agreed to proceed.  OPERATIVE NOTE:  Julie Gill was brought to the preoperative holding area on October 02, 2016.  Anesthesia placed an arterial blood pressure monitoring line and a central line.  She was taken to the operating room, anesthetized, and intubated with a double-lumen endotracheal tube. Intravenous antibiotics were  administered.  Sequential compression devices were placed on the calves for DVT prophylaxis.  A Foley catheter was placed.  She was placed in a left lateral decubitus position and the right chest was prepped and draped in usual sterile fashion.  Single lung ventilation of the left lung was initiated and was tolerated well throughout the procedure. After performing a time-out, an incision was made in the seventh interspace in the midaxillary line.  A 5 mm port was inserted into the chest and the thoracoscope was advanced into the chest.  The fissures were incomplete.  The lung was very slow to deflate.  Suction was applied and that did help, but the lung never did completely deflate.  A working incision was made in the fourth interspace anterolaterally.  No rib spreading was performed during the procedure.  The upper lobe was grasped.  The apex was palpated.  There was a faintly palpable nodule corresponding to the CT finding.  A wedge resection was performed with multiple firings of the Ethicon Echelon 60 mm stapler with gold cartridges keeping at least a 1 cm gross margin on the palpable nodule. The specimen was removed and sent for frozen section.  There was good hemostasis at the staple lines.  An On-Q local anesthetic catheter was placed through a separate stab incision posteriorly.  An attempt was  made to advance this into a subpleural position, but was not successful. It was placed along the outside of the rib cage and primed with 5 mL of 0.5% Marcaine.  Inspection revealed no obvious lymph nodes.  Due to the difficulty with the body habitus and the lung not completely deflating, a lymph node dissection was not attempted.  There were no suspicious nodes on the patient's scans.  The frozen section returned showing well-differentiated adenocarcinoma. The margin was clear.  A final inspection was made of the staple line. There was good hemostasis.  A 28-French chest tube was placed  through the original port incision and secured to skin with #1 silk suture.  The lung was reinflated.  The working incision was closed with a #1 Vicryl fascial suture.  The subcutaneous tissue and skin were closed in standard fashion.  All sponge, needle, and instrument counts were correct at the end of the procedure.  The patient was placed back in supine position.  The chest tubes were placed to suction.  She was then extubated in the operating room and taken to the postanesthetic care unit in good condition.     Revonda Standard Roxan Hockey, M.D.     SCH/MEDQ  D:  10/02/2016  T:  10/03/2016  Job:  [id number]

## 2016-10-03 NOTE — Discharge Summary (Signed)
Physician Discharge Summary       Julie Gill       Julie Gill,Julie Gill             [phone]    Patient ID: Julie Gill MRN: [id number] DOB/AGE: 06/30/1941 75 y.o.  Admit date: 10/02/2016 Discharge date: 10/07/2016  Admission Diagnoses: Multiple ground-glass opacities with dominant nodule in right upper lobe.  Active Diagnoses:  1. Adenocarcinoma of lung ([place]) 2. Tobacco abuse 3. Atrial fibrillation (HCC)-CHADS VASC score 3. Intolerant to flecainide. s/p afib ablation 02/12/12 4. GERD (gastroesophageal reflux disease) 5. Hyperlipidemia 6. Hypertension 7 Mild sleep apnea 8.Neuromuscular disorder ([place]) 9. Obesity 10. Restless leg syndrome 11. Kidney stones 12. Mitral regurgitation-mild  Consults: cardiology  Procedure (s):  Right video-assisted thoracoscopy, wedge resection, right upper lobe.  On-Q local anesthetic catheter placement by Dr. Roxan Hockey on 10/03/2016.   Pathology: Lung, wedge biopsy/resection, Right upper lobe - INVASIVE ADENOCARCINOMA, WELL DIFFERENTIATED, SPANNING 1.7 CM. - ADENOCARCINOMA INVOLVES THE PLEURAL SURFACE. - THE SURGICAL RESECTION MARGINS ARE NEGATIVE FOR CARCINOMA. - SEE ONCOLOGY TABLE BELOW.  History of Presenting Illness: This is a 75 year old woman with a past medical history significant for atrial fibrillation status post ablation, arthritis, gastroesophageal reflux, hyperlipidemia, anxiety, diastolic dysfunction, hyperlipidemia, hypertension, and obesity. Back in July she was having trouble with left flank pain and hematuria. She had a CT to evaluate for kidney stones. She was noted to have a groundglass opacity at the base of the right lower lobe. A CT of the chest was done which showed multiple groundglass opacities bilaterally. The largest was a 2.8 cm nodule in the apex of the right lung. She had a follow-up CT done on October 10 which showed persistence of the nodules. She was also noted to have a 6 mm soft  tissue component of the right apical nodule.  She had smoked approximately 5 cigarettes a day for a few years prior to quitting altogether in 1986. She denies cough, but does have wheezing when it is cold. Activities are somewhat limited by arthritis. She had an ablation for atrial fibrillation but remains on Xarelto. She has not had any significant appetite change or weight loss. She denies any unusual headaches or visual changes. She is not restricted in activities by shortness of breath.  Dr. Roxan Hockey and the patient discussed diagnostic and treatment options at her last visit on 09/17/2016. They discussed whether to do a navigational bronchoscopy or just proceed with a wedge resection.  Originally, it was planned to proceed with a navigational bronchoscopy. However, after having time to think it over and discuss with her family, patient  would like to proceed with surgical resection for definitive diagnosis and treatment of the right upper lobe apical groundglass opacity.  They had questions regarding the ability to remove the other groundglass opacities time surgery. Dr. Roxan Hockey did not think that was possible based on their location. These will be amorphous and very difficult to identify and would require resection of a significant amount of the lower lobe to ensure complete resection. Therefore, the plan will be to do a wedge resection of the apical nodule alone. Patient was admitted on 09/25/2016 in order to undergo a right VATS, wedge resection of RUL, and On Q placement.  Brief Hospital Course:  The patient remained afebrile and hemodynamically stable. She has a history of atrial fibrillation. She went into a fib post op and was put on a Cardizem drip. She converted to sinus rhythm/sinus bradycardia. She was on  Lopressor as take pre op. A cardiology consult was obtained. A line and foley were removed early in the post operative course. Foley was removed on post operative day two. Chest  tube output was minimal.Daily chest x rays were obtained and remained stable. There was no air leak.  Chest tube was placed to water seal on 11/03. It was removed on 10/04/2016. Follow up chest x ray showed stable right chest wall subcutaneous emphysema, questionable tiny right apical pneumothorax, and small right pleural effusion. Patient is ambulating on room air. Patient is tolerating a diet and has had a bowel movement. Wounds are clean and dry. She felt tired and somewhat lightheaded. As a result, Dr. Percival Spanish stopped her Lopressor. She is feeling better.She will be continued on HCTZ 12.5 mg daily, Lisinopril 40 mg daily, and Xarelto as take pre op. Patient is felt surgically stable for discharge today.   Latest Vital Signs: Blood pressure (!) 145/53, pulse 72, temperature 98 F (36.7 C), temperature source Oral, resp. rate 18, height '5\' 3"'$  (1.6 m), weight 207 lb 7.3 oz (94.1 kg), SpO2 96 %.  Physical Exam: Cardiovascular: RRR Pulmonary: Clear to auscultation bilaterally Abdomen: Soft, non tender, bowel sounds present. Wounds: Clean and dry.  No erythema or signs of infection.    Discharge Condition: Stable and discharged to home.  Recent laboratory studies:  Lab Results  Component Value Date   WBC 9.0 10/04/2016   HGB 10.4 (L) 10/04/2016   HCT 33.3 (L) 10/04/2016   MCV 93.5 10/04/2016   PLT 180 10/04/2016   Lab Results  Component Value Date   NA 137 10/04/2016   K 3.7 10/04/2016   CL 104 10/04/2016   CO2 28 10/04/2016   CREATININE 0.75 10/04/2016   GLUCOSE 110 (H) 10/04/2016    Diagnostic Studies:   EXAM: CHEST  2 VIEW  COMPARISON:  10/06/2016, 10/05/2016, 10/04/2016 and 10/03/2016  FINDINGS: Interval removal of right IJ central venous catheter. Lungs are adequately inflated and demonstrate mild stable bibasilar opacification likely atelectasis although cannot exclude infection. Stable postsurgical change over the medial right upper lobe. Again noted is a small  right apical pneumothorax improved from post chest tube removal film 10/04/2016 and unchanged from 10/05/2016. Stable small right effusion. Cardiomediastinal silhouette and remainder of the exam is unchanged.  IMPRESSION: Small right apical pneumothorax unchanged from 10/05/2016. Stable postsurgical change over the medial right upper lobe.  Stable mild bibasilar opacification likely atelectasis, although cannot exclude infection. Stable small right pleural effusion.  These results will be called to the ordering clinician or representative by the Radiologist Assistant, and communication documented in the PACS or zVision Dashboard.   Electronically Signed   By: Marin Olp M.D.   On: 10/07/2016 09:35  Ct Chest Wo Contrast  Result Date: 09/09/2016 CLINICAL DATA:  75 year old female with history of pulmonary nodules. Followup study. EXAM: CT CHEST WITHOUT CONTRAST TECHNIQUE: Multidetector CT imaging of the chest was performed following the standard protocol without IV contrast. COMPARISON:  Chest CT 06/10/2016. FINDINGS: Cardiovascular: Heart size is borderline enlarged. There is no significant pericardial fluid, thickening or pericardial calcification. There is aortic atherosclerosis, as well as atherosclerosis of the great vessels of the mediastinum and the coronary arteries, including calcified atherosclerotic plaque in the left main, left anterior descending, left circumflex and right coronary arteries. Calcifications of the mitral annulus. Mediastinum/Nodes: No pathologically enlarged mediastinal or hilar lymph nodes. Please note that accurate exclusion of hilar adenopathy is limited on noncontrast CT scans. Esophagus is unremarkable in  appearance. No axillary lymphadenopathy. Lungs/Pleura: Several sub solid pulmonary nodules are again noted, most pronounced in the lung apices. Low largest of these is a 2.8 x 2.4 x 2.2 cm ground-glass attenuation nodule (axial image 20 of series 7 and  coronal image 88 of series 5), which has a 6 mm solid component in the periphery of the lesion, best appreciated on coronal image 90 of series 5, where there is clear contact with the overlying pleura. Other 6 mm ground-glass attenuation nodules in the left upper lobe are noted on images 20 and 25 of series 7. 9 mm right lower lobe nodule (image 76 of series 7) is unchanged. 10 mm right lower lobe ground-glass attenuation nodule (image 101 of series 7) is also unchanged. Several other smaller sub cm ground-glass attenuation nodules are also noted and unchanged. No acute consolidative airspace disease. No pleural effusions. Extreme lung bases were incompletely imaged. Upper Abdomen: 12 mm low-attenuation lesion in segment 3 of the liver is incompletely characterized on today's noncontrast CT examination, but is the was previously characterized as a simple cyst. Calcified granulomas in the liver. Aortic atherosclerosis. Musculoskeletal: There are no aggressive appearing lytic or blastic lesions noted in the visualized portions of the skeleton. IMPRESSION: 1. Although the previously noted sub solid pulmonary nodules appear generally unchanged compared to the prior examination, the largest of these nodules in the apex of the right upper lobe has a 6 mm solid component, and overall measures 2.8 x 2.4 x 2.2 cm. This is considered highly suspicious, and because of this, the overall appearance of the lungs is highly concerning for possible multifocal synchronous bronchogenic adenocarcinoma. PET-CT could be considered at this time, but may be falsely negative given that the solid component of the largest nodule is less than 8 mm in size. Alternatively, biopsy of this apical nodule could be considered. Surgical resection of lesions like this can also be considered, however, given the multifocality of findings on today's examination, surgical resection is not recommended for this patient. This recommendation follows the  consensus statement: Guidelines for Management of Incidental Pulmonary Nodules Detected on CT Images: From the Fleischner Society 2017; Radiology 2017; 284:228-243. 2. Aortic atherosclerosis, in addition to left main and 3 vessel coronary artery disease. Assessment for potential risk factor modification, dietary therapy or pharmacologic therapy may be warranted, if clinically indicated. These results were called by telephone at the time of interpretation on 09/09/2016 at 1:23 pm to Dr. Abigail Butts MCNEILL, who verbally acknowledged these results. Electronically Signed   By: Vinnie Langton M.D.   On: 09/09/2016 13:25   Discharge Medications:   Medication List    STOP taking these medications   metoprolol tartrate 25 MG tablet Commonly known as:  LOPRESSOR     TAKE these medications   calcium carbonate 600 MG Tabs tablet Commonly known as:  OS-CAL Take 600 mg by mouth daily.   clonazePAM 0.5 MG tablet Commonly known as:  KLONOPIN Take 0.25 mg by mouth 2 (two) times daily as needed for anxiety.   fish oil-omega-3 fatty acids 1000 MG capsule Take 1 g by mouth 2 (two) times daily.   hydrochlorothiazide 25 MG tablet Commonly known as:  HYDRODIURIL TAKE 1/2 TABLET EVERY DAY (NEED AN APPT WITH LORI QZRAQTMA) What changed:  See the new instructions.   lisinopril 40 MG tablet Commonly known as:  PRINIVIL,ZESTRIL Take 40 mg by mouth daily.   lovastatin 40 MG tablet Commonly known as:  MEVACOR Take 80 mg by mouth daily.  MAGNESIUM PO Take 250 mg by mouth daily.   multivitamin with minerals tablet Take 1 tablet by mouth daily.   nitroGLYCERIN 0.4 MG SL tablet Commonly known as:  NITROSTAT Place 1 tablet (0.4 mg total) under the tongue every 5 (five) minutes as needed for chest pain.   pantoprazole 40 MG tablet Commonly known as:  PROTONIX Take 1 tablet (40 mg total) by mouth daily. What changed:  when to take this   potassium chloride SA 20 MEQ tablet Commonly known as:   K-DUR,KLOR-CON Take 20 mEq by mouth daily.   senna 8.6 MG tablet Commonly known as:  SENOKOT Take 1 tablet by mouth daily.   traMADol 50 MG tablet Commonly known as:  ULTRAM Take 1 tablet (50 mg total) by mouth every 6 (six) hours as needed for moderate pain (mild pain).   VENTOLIN HFA 108 (90 Base) MCG/ACT inhaler Generic drug:  albuterol Inhale 2 puffs into the lungs every 6 (six) hours as needed for wheezing or shortness of breath.   VITAMIN D PO Take 4,000 Units by mouth daily.   vitamin E 400 UNIT capsule Generic drug:  vitamin E Take 400 Units by mouth daily.   XARELTO 20 MG Tabs tablet Generic drug:  rivaroxaban Take 20 mg by mouth daily with supper.       Follow Up Appointments: Follow-up Information    [name], MD Follow up on 10/21/2016.   Specialty:  Cardiothoracic Surgery Why:  PA/LAT CXR to be taken (at [place] which is in the same building as Dr. Leonarda Salon office) on 10/21/2016 at 9:30 am ;Appointment time is at 10:00 am Contact information: [place] [address] [phone]        LORI GERHARDT, NP Follow up on 11/12/2016.   Specialties:  Nurse Practitioner, Interventional Cardiology, Cardiology, Radiology Why:  Appointment time is at 2:00 pm Contact information: [place]. [address] [place] [address] [phone]           Signed: Lars Pinks MPA-C 10/07/2016, 10:19 AM

## 2016-10-04 ENCOUNTER — Inpatient Hospital Stay (HOSPITAL_COMMUNITY): Payer: Commercial Managed Care - HMO

## 2016-10-04 LAB — CBC
HCT: 33.3 % — ABNORMAL LOW (ref 36.0–46.0)
Hemoglobin: 10.4 g/dL — ABNORMAL LOW (ref 12.0–15.0)
MCH: 29.2 pg (ref 26.0–34.0)
MCHC: 31.2 g/dL (ref 30.0–36.0)
MCV: 93.5 fL (ref 78.0–100.0)
Platelets: 180 10*3/uL (ref 150–400)
RBC: 3.56 MIL/uL — ABNORMAL LOW (ref 3.87–5.11)
RDW: 14 % (ref 11.5–15.5)
WBC: 9 10*3/uL (ref 4.0–10.5)

## 2016-10-04 LAB — COMPREHENSIVE METABOLIC PANEL
ALT: 16 U/L (ref 14–54)
AST: 18 U/L (ref 15–41)
Albumin: 2.6 g/dL — ABNORMAL LOW (ref 3.5–5.0)
Alkaline Phosphatase: 58 U/L (ref 38–126)
Anion gap: 5 (ref 5–15)
BUN: 9 mg/dL (ref 6–20)
CO2: 28 mmol/L (ref 22–32)
Calcium: 8.1 mg/dL — ABNORMAL LOW (ref 8.9–10.3)
Chloride: 104 mmol/L (ref 101–111)
Creatinine, Ser: 0.75 mg/dL (ref 0.44–1.00)
GFR calc Af Amer: >60 mL/min (ref >60)
GFR calc non Af Amer: >60 mL/min (ref >60)
Glucose, Bld: 110 mg/dL — ABNORMAL HIGH (ref 65–99)
Potassium: 3.7 mmol/L (ref 3.5–5.1)
Sodium: 137 mmol/L (ref 135–145)
Total Bilirubin: 0.5 mg/dL (ref 0.3–1.2)
Total Protein: 5.2 g/dL — ABNORMAL LOW (ref 6.5–8.1)

## 2016-10-04 NOTE — Progress Notes (Signed)
    Dr Blenda Mounts consult reviewed. Patient is back in NSR, doing well on lopressor 12.'5mg'$  bid. Restart anticoag when ok from surgical standpoint. No additional cardiology recs at this time, we will f/u telemetry tomorrow. Call with questions         Carlyle Dolly, M.D., F.A.C.C.Patient ID: [name], female   DOB: 1941/11/25, 75 y.o.   MRN: [id number]

## 2016-10-04 NOTE — Progress Notes (Addendum)
      [address]       [place] [address]             [phone]      2 Days Post-Op Procedure(s) (LRB): VIDEO ASSISTED THORACOSCOPY (VATS)/WEDGE RESECTION (Right) Subjective: Feeling okay  Objective: Vital signs in last 24 hours: Temp:  [97.7 F (36.5 C)-101.7 F (38.7 C)] 97.7 F (36.5 C) (11/04 0800) Pulse Rate:  [57-74] 74 (11/04 1000) Cardiac Rhythm: Normal sinus rhythm (11/04 0800) Resp:  [15-26] 16 (11/04 1142) BP: (114-135)/(48-64) 135/53 (11/04 1000) SpO2:  [93 %-99 %] 99 % (11/04 1142)     Intake/Output from previous day: 11/03 0701 - 11/04 0700 In: 1854.2 [P.O.:480; I.V.:1374.2] Out: 2108 [Urine:2000; Chest Tube:108] Intake/Output this shift: No intake/output data recorded.  General appearance: alert, cooperative and no distress Heart: regular rate and rhythm Lungs: clear to auscultation bilaterally Abdomen: soft, non-tender; bowel sounds normal; no masses,  no organomegaly Extremities: extremities normal, atraumatic, no cyanosis or edema Wound: c/d/i without drainage  Lab Results:  Recent Labs  10/03/16 0405 10/04/16 0428  WBC 12.0* 9.0  HGB 10.6* 10.4*  HCT 33.5* 33.3*  PLT 219 180   BMET:  Recent Labs  10/03/16 0405 10/04/16 0428  NA 137 137  K 4.1 3.7  CL 103 104  CO2 26 28  GLUCOSE 131* 110*  BUN 11 9  CREATININE 0.82 0.75  CALCIUM 8.3* 8.1*    PT/INR: No results for input(s): LABPROT, INR in the last 72 hours. ABG    Component Value Date/Time   PHART 7.373 10/03/2016 0400   HCO3 26.6 10/03/2016 0400   O2SAT 97.8 10/03/2016 0400   CBG (last 3)  No results for input(s): GLUCAP in the last 72 hours.  Assessment/Plan: S/P Procedure(s) (LRB): VIDEO ASSISTED THORACOSCOPY (VATS)/WEDGE RESECTION (Right)  POD 2 wedge resection CXR reviewed. No pneumothorax. Stable image. Chest tube to suction. + fluid wave but no clear air leak. 128m/24 hours Recurrent atrial fibrillation which converted with diltiazem  drip. Cardiology following.  Restart Xarelto today Ambulate.  Acute blood loss anemia stable. H and H 10.4/33.3  Plan: Can likely remove the chest tube today. Still having some pain in her back. Some drainage coming from around the chest tube that is clear. Rhythm management per cardiology, currently sinus brady rate 59.    LOS: 2 days    TElgie Collard11/03/2016   Chart reviewed, patient examined, agree with above. CXR looks ok with no ptx. There is no air leak. Will remove chest tube and get follow up CXR tomorrow.

## 2016-10-05 ENCOUNTER — Inpatient Hospital Stay (HOSPITAL_COMMUNITY): Payer: Commercial Managed Care - HMO

## 2016-10-05 NOTE — Progress Notes (Signed)
Telemetry reviewed, patient maintaining SR, had gone into afib postop. Occasional low HR's in early AM hours but overall rates look good. Restart anticoag per surgery. Continue lopressor 12.'5mg'$  bid for rate control. No additional recs over the weekend, please call with questions.    Zandra Abts MD

## 2016-10-05 NOTE — Discharge Instructions (Signed)
Thoracoscopy, Care After Refer to this sheet in the next few weeks. These instructions provide you with information about caring for yourself after your procedure. Your health care provider may also give you more specific instructions. Your treatment has been planned according to current medical practices, but problems sometimes occur. Call your health care provider if you have any problems or questions after your procedure. WHAT TO EXPECT AFTER THE PROCEDURE: After your procedure, it is common to feel sore for up to two weeks. HOME CARE INSTRUCTIONS  There are many different ways to close and cover an incision, including stitches (sutures), skin glue, and adhesive strips. Follow your health care provider's instructions about:  Incision care.  Bandage (dressing) changes and removal.  Incision closure removal.  Check your incision area every day for signs of infection. Watch for:  Redness, swelling, or pain.  Fluid, blood, or pus.  Take medicines only as directed by your health care provider.  Try to cough often. Coughing helps to protect against lung infection (pneumonia). It may hurt to cough. If this happens, hold a pillow against your chest when you cough.  Take deep breaths. This also helps to protect against pneumonia.  If you were given an incentive spirometer, use it as directed by your health care provider.  Do not take baths, swim, or use a hot tub until your health care provider approves. You may take showers.  Avoid lifting until your health care provider approves.  Avoid driving until your health care provider approves.  Do not travel by airplane after the chest tube is removed until your health care provider approves. SEEK MEDICAL CARE IF:  You have a fever.  Pain medicines do not ease your pain.  You have redness, swelling, or increasing pain in your incision area.  You develop a cough that does not go away, or you are coughing up mucus that is yellow or  green. SEEK IMMEDIATE MEDICAL CARE IF:  You have fluid, blood, or pus coming from your incision.  There is a bad smell coming from your incision or dressing.  You develop a rash.  You have difficulty breathing.  You cough up blood.  You develop light-headedness or you feel faint.  You develop chest pain.  Your heartbeat feels irregular or very fast.   This information is not intended to replace advice given to you by your health care provider. Make sure you discuss any questions you have with your health care provider.   Document Released: 06/06/2005 Document Revised: 12/08/2014 Document Reviewed: 08/02/2014 Elsevier Interactive Patient Education 2016 [place] on my medicine - XARELTO (Rivaroxaban)  This medication education was reviewed with me or my healthcare representative as part of my discharge preparation.    Why was Xarelto prescribed for you? Xarelto was prescribed for you to reduce the risk of a blood clot forming that can cause a stroke if you have a medical condition called atrial fibrillation (a type of irregular heartbeat).  What do you need to know about xarelto ? Take your Xarelto ONCE DAILY at the same time every day with your evening meal. If you have difficulty swallowing the tablet whole, you may crush it and mix in applesauce just prior to taking your dose.  Take Xarelto exactly as prescribed by your doctor and DO NOT stop taking Xarelto without talking to the doctor who prescribed the medication.  Stopping without other stroke prevention medication to take the place of Xarelto may increase your risk of developing  a clot that causes a stroke.  Refill your prescription before you run out.  After discharge, you should have regular check-up appointments with your healthcare provider that is prescribing your Xarelto.  In the future your dose may need to be changed if your kidney function or weight changes by a significant  amount.  What do you do if you miss a dose? If you are taking Xarelto ONCE DAILY and you miss a dose, take it as soon as you remember on the same day then continue your regularly scheduled once daily regimen the next day. Do not take two doses of Xarelto at the same time or on the same day.   Important Safety Information A possible side effect of Xarelto is bleeding. You should call your healthcare provider right away if you experience any of the following: ? Bleeding from an injury or your nose that does not stop. ? Unusual colored urine (red or dark brown) or unusual colored stools (red or black). ? Unusual bruising for unknown reasons. ? A serious fall or if you hit your head (even if there is no bleeding).  Some medicines may interact with Xarelto and might increase your risk of bleeding while on Xarelto. To help avoid this, consult your healthcare provider or pharmacist prior to using any new prescription or non-prescription medications, including herbals, vitamins, non-steroidal anti-inflammatory drugs (NSAIDs) and supplements.  This website has more information on Xarelto: [email or web address]-benson.[email or web address]

## 2016-10-05 NOTE — Progress Notes (Signed)
3 Days Post-Op Procedure(s) (LRB): VIDEO ASSISTED THORACOSCOPY (VATS)/WEDGE RESECTION (Right) Subjective:  Doesn't feel as good as yesterday. Has a headache. Maintaining sinus rhythm  Objective: Vital signs in last 24 hours: Temp:  [98 F (36.7 C)-98.8 F (37.1 C)] 98 F (36.7 C) (11/05 0900) Pulse Rate:  [54-67] 54 (11/05 0900) Cardiac Rhythm: Normal sinus rhythm (11/05 0930) Resp:  [15-28] 20 (11/05 0900) BP: (100-150)/(42-89) 132/45 (11/05 0900) SpO2:  [91 %-100 %] 92 % (11/05 0900)  Hemodynamic parameters for last 24 hours:    Intake/Output from previous day: 11/04 0701 - 11/05 0700 In: 664 [I.V.:664] Out: 1 [Stool:1] Intake/Output this shift: No intake/output data recorded.  General appearance: alert and cooperative Heart: regular rate and rhythm, S1, S2 normal, no murmur, click, rub or gallop Lungs: clear to auscultation bilaterally Wound: incisions ok  Lab Results:  Recent Labs  10/03/16 0405 10/04/16 0428  WBC 12.0* 9.0  HGB 10.6* 10.4*  HCT 33.5* 33.3*  PLT 219 180   BMET:  Recent Labs  10/03/16 0405 10/04/16 0428  NA 137 137  K 4.1 3.7  CL 103 104  CO2 26 28  GLUCOSE 131* 110*  BUN 11 9  CREATININE 0.82 0.75  CALCIUM 8.3* 8.1*    PT/INR: No results for input(s): LABPROT, INR in the last 72 hours. ABG    Component Value Date/Time   PHART 7.373 10/03/2016 0400   HCO3 26.6 10/03/2016 0400   O2SAT 97.8 10/03/2016 0400   CBG (last 3)  No results for input(s): GLUCAP in the last 72 hours.  CLINICAL DATA:  Minor chest pain in mid sternal recent with shortness of breath, chest tube removal, history asthma, hypertension, atrial fibrillation, post VATS with breads resection 10/02/2016  EXAM: CHEST  2 VIEW  COMPARISON:  10/04/2016  FINDINGS: RIGHT jugular central venous catheter with tip projecting over SVC.  Persistent small RIGHT-sided pneumothorax.  Enlargement of cardiac silhouette.  Mediastinal contours and pulmonary  vascularity normal.  Atelectasis and postsurgical changes in the medial RIGHT upper lobe.  Mild RIGHT basilar atelectasis.  LEFT lung clear.  Bones demineralized.  IMPRESSION: Persistent small RIGHT pneumothorax.  Enlargement of cardiac silhouette.   Electronically Signed   By: Lavonia Dana M.D.   On: 10/05/2016 09:11  Assessment/Plan: S/P Procedure(s) (LRB): VIDEO ASSISTED THORACOSCOPY (VATS)/WEDGE RESECTION (Right)  She is doing well POD 3. CXR after chest tube removal yesterday showed small right apical ptx and it is unchanged today and insignificant. Will repeat CXR in am.  Continue mobilization, IS  Maintaining sinus on lopressor. Xarelto started.  Transfer to 2W   Should be ready to go home in the next day or two if no changes.  LOS: 3 days    Gaye Pollack 10/05/2016

## 2016-10-06 ENCOUNTER — Inpatient Hospital Stay (HOSPITAL_COMMUNITY): Payer: Commercial Managed Care - HMO

## 2016-10-06 DIAGNOSIS — R001 Bradycardia, unspecified: Secondary | ICD-10-CM

## 2016-10-06 MED ORDER — CALCIUM CARBONATE 1250 (500 CA) MG PO TABS
1.0000 | ORAL_TABLET | Freq: Every day | ORAL | Status: DC
  Administered 2016-10-06 – 2016-10-07 (×2): 500 mg via ORAL
  Filled 2016-10-06: qty 1

## 2016-10-06 MED ORDER — HYDROCHLOROTHIAZIDE 12.5 MG PO CAPS
12.5000 mg | ORAL_CAPSULE | Freq: Every day | ORAL | Status: DC
  Administered 2016-10-06 – 2016-10-07 (×2): 12.5 mg via ORAL
  Filled 2016-10-06 (×2): qty 1

## 2016-10-06 MED ORDER — TRAMADOL HCL 50 MG PO TABS: 50.0000 mg | ORAL_TABLET | Freq: Four times a day (QID) | ORAL | 0 refills | Status: DC | PRN

## 2016-10-06 MED ORDER — LISINOPRIL 10 MG PO TABS
20.0000 mg | ORAL_TABLET | Freq: Every day | ORAL | Status: DC
  Administered 2016-10-06 – 2016-10-07 (×2): 20 mg via ORAL
  Filled 2016-10-06 (×2): qty 2

## 2016-10-06 NOTE — Progress Notes (Signed)
Central line removed without complication.  Patient on flat bed rest for 30 minutes.  Will continue to monitor.

## 2016-10-06 NOTE — Care Management Important Message (Signed)
Important Message  Patient Details  Name: Julie Gill MRN: [id number] Date of Birth: 10-Jun-1941   Medicare Important Message Given:  Yes    Nathen May 10/06/2016, 11:46 AM

## 2016-10-06 NOTE — Progress Notes (Addendum)
      [address]       [place] [address]             [phone]       4 Days Post-Op Procedure(s) (LRB): VIDEO ASSISTED THORACOSCOPY (VATS)/WEDGE RESECTION (Right)  Subjective: Patient has complaints of fatigue and burning at incision. She "doesn't think she can go home".  Objective: Vital signs in last 24 hours: Temp:  [98 F (36.7 C)-98.9 F (37.2 C)] 98.3 F (36.8 C) (11/06 0607) Pulse Rate:  [54-64] 59 (11/06 0607) Cardiac Rhythm: Normal sinus rhythm (11/05 2051) Resp:  [18-25] 18 (11/06 0607) BP: (132-153)/(45-55) 141/47 (11/06 0607) SpO2:  [91 %-96 %] 93 % (11/06 0607)   Intake/Output from previous day: No intake/output data recorded.   Physical Exam:  Cardiovascular: RRR Pulmonary: Clear to auscultation bilaterally Abdomen: Soft, non tender, bowel sounds present. Wounds: Clean and dry.  No erythema or signs of infection.   Lab Results: CBC: Recent Labs  10/04/16 0428  WBC 9.0  HGB 10.4*  HCT 33.3*  PLT 180   BMET:  Recent Labs  10/04/16 0428  NA 137  K 3.7  CL 104  CO2 28  GLUCOSE 110*  BUN 9  CREATININE 0.75  CALCIUM 8.1*    PT/INR: No results for input(s): LABPROT, INR in the last 72 hours. ABG:  INR: Will add last result for INR, ABG once components are confirmed Will add last 4 CBG results once components are confirmed  Assessment/Plan:  1. CV - SB/SR. On Xarelto 20 mg daily and Lopressor 12.5 mg bid-per patient,  Dr. Rayann Heman prescribed. May need to stop and have patient follow up with him. Will restart HCTZ and Lisinopril at discharge 2.  Pulmonary - On room air. Encourage incentive spirometer. CXR this am shows probable small right apical pneumothorax, small right pleural effusion, and stable right chest wall subcutaneous emphysema. Encourage incentive spirometer. 3. Remove central line 4.Disposition per Dr. Burman Blacksmith MPA-C 10/06/2016,7:54 AM

## 2016-10-06 NOTE — Progress Notes (Signed)
Patient Name: Julie Gill Date of Encounter: 10/06/2016  Hospital Problem List     Active Problems:   Adenocarcinoma of lung [facility])    Patient Profile     75 y/o female with h/o atrial fibrillation s/p ablation in 2013, chronic anticoagulation therapy with Xarelto, OSA, bradycardia, HTN, HLD and bilateral pulmonary nodules admitted for VATS + wedge resection with Dr. Roxan Hockey given suspicion for possible adenocarcinoma. Post procedural recovery complicated by recurrent atrial fibrillation.   Subjective   She feels light headed with ambulation.  She has had incisional pain.   Inpatient Medications    . acetaminophen  1,000 mg Oral Q6H   Or  . acetaminophen (TYLENOL) oral liquid 160 mg/5 mL  1,000 mg Oral Q6H  . bisacodyl  10 mg Oral Daily  . calcium carbonate  625 mg Oral Daily  . magnesium oxide  200 mg Oral Daily  . metoprolol tartrate  12.5 mg Oral BID  . pantoprazole  40 mg Oral QPM  . pravastatin  40 mg Oral q1800  . rivaroxaban  20 mg Oral Q supper  . senna-docusate  1 tablet Oral QHS    Vital Signs    Vitals:   10/05/16 1100 10/05/16 1518 10/05/16 2100 10/06/16 0607  BP: (!) 150/55 (!) 134/53 (!) 153/52 (!) 141/47  Pulse: 62 (!) 57 64 (!) 59  Resp: (!) '25 20 20 18  '$ Temp: 98.1 F (36.7 C) 98.9 F (37.2 C) 98.8 F (37.1 C) 98.3 F (36.8 C)  TempSrc: Oral Oral Oral Oral  SpO2: 94% 96% 93% 93%  Weight:      Height:        Intake/Output Summary (Last 24 hours) at 10/06/16 0929 Last data filed at 10/06/16 9678  Gross per 24 hour  Intake              240 ml  Output                0 ml  Net              240 ml   Filed Weights   10/02/16 1159 10/03/16 0329  Weight: 196 lb (88.9 kg) 207 lb 7.3 oz (94.1 kg)    Physical Exam    GEN: Well nourished, well developed, in no acute distress.  Neck: Supple, no JVD, carotid bruits, or masses. Cardiac: RRR, no rubs, or gallops. No clubbing, cyanosis, no edema.  Radials/DP/PT 2+ and equal bilaterally.    Respiratory:  Respirations no regular and unlabored, clear to auscultation bilaterally. GI: Soft, nontender, nondistended, BS + x 4. Neuro:  Strength and sensation are intact.   Labs    CBC  Recent Labs  10/04/16 0428  WBC 9.0  HGB 10.4*  HCT 33.3*  MCV 93.5  PLT 938   Basic Metabolic Panel  Recent Labs  10/04/16 0428  NA 137  K 3.7  CL 104  CO2 28  GLUCOSE 110*  BUN 9  CREATININE 0.75  CALCIUM 8.1*   Liver Function Tests  Recent Labs  10/04/16 0428  AST 18  ALT 16  ALKPHOS 58  BILITOT 0.5  PROT 5.2*  ALBUMIN 2.6*   No results for input(s): LIPASE, AMYLASE in the last 72 hours. Cardiac Enzymes No results for input(s): CKTOTAL, CKMB, CKMBINDEX, TROPONINI in the last 72 hours. BNP Invalid input(s): POCBNP D-Dimer No results for input(s): DDIMER in the last 72 hours. Hemoglobin A1C No results for input(s): HGBA1C in the last 72 hours. Fasting  Lipid Panel No results for input(s): CHOL, HDL, LDLCALC, TRIG, CHOLHDL, LDLDIRECT in the last 72 hours. Thyroid Function Tests No results for input(s): TSH, T4TOTAL, T3FREE, THYROIDAB in the last 72 hours.  Invalid input(s): FREET3  Telemetry    Sinus bradycardia  ECG    NA  Radiology    Dg Chest 2 View  Result Date: 10/06/2016 CLINICAL DATA:  Thoracotomy. EXAM: CHEST  2 VIEW COMPARISON:  10/05/2016. FINDINGS: Right IJ line stable position. Mediastinum is stable. Heart size stable. Persistent postsurgical changes right upper lobe. Previously identified right apical pneumothorax is not definitely identified. Right apical pleural thickening is again noted and is consistent with scarring. Bibasilar atelectasis. Reference made to prior CT report for discussion of other tiny pulmonary nodules present . Tiny right pleural effusion. Right chest wall subcutaneous emphysema again noted. IMPRESSION: 1.  Right IJ line stable position. 2. Postsurgical changes right upper lobe again noted. Previously identified tiny  right apical pneumothorax is not definitely identified on today's exam. Stable right apical pleural thickening noted consistent scarring. Small right pleural effusion noted. 3. Stable right chest wall subcutaneous emphysema . Electronically Signed   By: Marcello Moores  Register   On: 10/06/2016 07:25   Dg Chest 2 View  Result Date: 10/05/2016 CLINICAL DATA:  Minor chest pain in mid sternal recent with shortness of breath, chest tube removal, history asthma, hypertension, atrial fibrillation, post VATS with breads resection 10/02/2016 EXAM: CHEST  2 VIEW COMPARISON:  10/04/2016 FINDINGS: RIGHT jugular central venous catheter with tip projecting over SVC. Persistent small RIGHT-sided pneumothorax. Enlargement of cardiac silhouette. Mediastinal contours and pulmonary vascularity normal. Atelectasis and postsurgical changes in the medial RIGHT upper lobe. Mild RIGHT basilar atelectasis. LEFT lung clear. Bones demineralized. IMPRESSION: Persistent small RIGHT pneumothorax. Enlargement of cardiac silhouette. Electronically Signed   By: Lavonia Dana M.D.   On: 10/05/2016 09:11   Dg Chest 2 View  Result Date: 10/02/2016 CLINICAL DATA:  Pre-op respiratory exam for RIGHT VIDEO ASSISTED THORACOSCOPY (VATS) and /WEDGE RESECTION for Bilateral Lung Nodules.Dry cough. Hx HTN, A-Fib with Ablation. Asthma. Social smoker for 5-6 yrs - quit completely in 1986. EXAM: CHEST  2 VIEW COMPARISON:  05/28/2016 FINDINGS: The cardiac silhouette is normal in size and configuration. No mediastinal or hilar masses. No evidence of adenopathy. Lungs are mildly hyperexpanded but clear. No pleural effusion or pneumothorax. Skeletal structures are intact. IMPRESSION: No active cardiopulmonary disease. Electronically Signed   By: Lajean Manes M.D.   On: 10/02/2016 11:57   Ct Chest Wo Contrast  Result Date: 09/09/2016 CLINICAL DATA:  75 year old female with history of pulmonary nodules. Followup study. EXAM: CT CHEST WITHOUT CONTRAST TECHNIQUE:  Multidetector CT imaging of the chest was performed following the standard protocol without IV contrast. COMPARISON:  Chest CT 06/10/2016. FINDINGS: Cardiovascular: Heart size is borderline enlarged. There is no significant pericardial fluid, thickening or pericardial calcification. There is aortic atherosclerosis, as well as atherosclerosis of the great vessels of the mediastinum and the coronary arteries, including calcified atherosclerotic plaque in the left main, left anterior descending, left circumflex and right coronary arteries. Calcifications of the mitral annulus. Mediastinum/Nodes: No pathologically enlarged mediastinal or hilar lymph nodes. Please note that accurate exclusion of hilar adenopathy is limited on noncontrast CT scans. Esophagus is unremarkable in appearance. No axillary lymphadenopathy. Lungs/Pleura: Several sub solid pulmonary nodules are again noted, most pronounced in the lung apices. Low largest of these is a 2.8 x 2.4 x 2.2 cm ground-glass attenuation nodule (axial image 20 of series 7  and coronal image 88 of series 5), which has a 6 mm solid component in the periphery of the lesion, best appreciated on coronal image 90 of series 5, where there is clear contact with the overlying pleura. Other 6 mm ground-glass attenuation nodules in the left upper lobe are noted on images 20 and 25 of series 7. 9 mm right lower lobe nodule (image 76 of series 7) is unchanged. 10 mm right lower lobe ground-glass attenuation nodule (image 101 of series 7) is also unchanged. Several other smaller sub cm ground-glass attenuation nodules are also noted and unchanged. No acute consolidative airspace disease. No pleural effusions. Extreme lung bases were incompletely imaged. Upper Abdomen: 12 mm low-attenuation lesion in segment 3 of the liver is incompletely characterized on today's noncontrast CT examination, but is the was previously characterized as a simple cyst. Calcified granulomas in the liver. Aortic  atherosclerosis. Musculoskeletal: There are no aggressive appearing lytic or blastic lesions noted in the visualized portions of the skeleton. IMPRESSION: 1. Although the previously noted sub solid pulmonary nodules appear generally unchanged compared to the prior examination, the largest of these nodules in the apex of the right upper lobe has a 6 mm solid component, and overall measures 2.8 x 2.4 x 2.2 cm. This is considered highly suspicious, and because of this, the overall appearance of the lungs is highly concerning for possible multifocal synchronous bronchogenic adenocarcinoma. PET-CT could be considered at this time, but may be falsely negative given that the solid component of the largest nodule is less than 8 mm in size. Alternatively, biopsy of this apical nodule could be considered. Surgical resection of lesions like this can also be considered, however, given the multifocality of findings on today's examination, surgical resection is not recommended for this patient. This recommendation follows the consensus statement: Guidelines for Management of Incidental Pulmonary Nodules Detected on CT Images: From the Fleischner Society 2017; Radiology 2017; 284:228-243. 2. Aortic atherosclerosis, in addition to left main and 3 vessel coronary artery disease. Assessment for potential risk factor modification, dietary therapy or pharmacologic therapy may be warranted, if clinically indicated. These results were called by telephone at the time of interpretation on 09/09/2016 at 1:23 pm to Dr. Abigail Butts MCNEILL, who verbally acknowledged these results. Electronically Signed   By: Vinnie Langton M.D.   On: 09/09/2016 13:25   Dg Chest Port 1 View  Result Date: 10/04/2016 CLINICAL DATA:  Postop from right lung wedge resection. Chest tube removal. EXAM: PORTABLE CHEST 1 VIEW COMPARISON:  Prior today FINDINGS: Right jugular central venous catheter remains in place. Right chest tube is been removed since previous study.  A 10-15% right apical pneumothorax is now seen. Atelectasis or contusion is again seen in the medial right upper lobe in area of surgical staples. Lung fields are otherwise clear. Heart size is stable. IMPRESSION: 10-15% right apical pneumothorax following right chest tube removal. Electronically Signed   By: Earle Gell M.D.   On: 10/04/2016 17:20   Dg Chest Port 1 View  Result Date: 10/04/2016 CLINICAL DATA:  Cough and shortness of Breath EXAM: PORTABLE CHEST 1 VIEW COMPARISON:  10/03/2016 FINDINGS: Cardiac shadow is mildly enlarged but stable. Right jugular central line and right-sided chest tube are again noted. No pneumothorax is seen. The lungs are well aerated with minimal basilar atelectasis bilaterally. No bony abnormality is noted. IMPRESSION: Stable appearance of the chest.  No acute abnormality noted. Electronically Signed   By: Inez Catalina M.D.   On: 10/04/2016 08:14  Dg Chest Port 1 View  Result Date: 10/03/2016 CLINICAL DATA:  Postoperative for lung carcinoma. Chest tube in place EXAM: PORTABLE CHEST 1 VIEW COMPARISON:  October 02, 2016 FINDINGS: There is postoperative change on the right. There is soft tissue air on the right but no evident pneumothorax. Chest tube position is unchanged. Central catheter tip is in the superior vena cava. There is new atelectasis in the right base. The lungs elsewhere are clear. Heart is mildly prominent with pulmonary vascularity within normal limits. No adenopathy. There is atherosclerotic calcification in the aorta. 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 IMPRESSION: Tube and catheter positions as described without pneumothorax. There is soft tissue air on the right. There is new atelectasis right base. Lungs elsewhere clear. Stable cardiac silhouette. There is aortic atherosclerosis. Electronically Signed   By: Lowella Grip III M.D.   On: 10/03/2016 08:01   Dg Chest Port 1 View  Result Date: 10/02/2016 CLINICAL DATA:  Follow-up  lung adenocarcinoma.  Initial encounter. EXAM: PORTABLE CHEST 1 VIEW COMPARISON:  Chest radiograph performed earlier today at 11:39 a.m. FINDINGS: Vascular congestion is noted. Focal right upper lobe opacity is seen, status post video-assisted thoracoscopy and wedge resection. A small left pleural effusion is suggested. A right apical chest tube is noted. The patient's right IJ line is noted ending about the mid to distal SVC. The cardiomediastinal silhouette is mildly enlarged. No acute osseous abnormalities are seen. IMPRESSION: 1. Focal right upper lobe opacity, status post video-assisted thoracoscopy and wedge resection. Small left pleural effusion suggested. 2. Vascular congestion and mild cardiomegaly. Electronically Signed   By: Garald Balding M.D.   On: 10/02/2016 18:20    Assessment & Plan    SINUS BRADYCARDIA:    She does feel somewhat light headed.  I will discontinue the beta blocker.   ATRIAL FIB:  On Xarelto.  Now in NSR.  Was in atrial fib post up.    STATUS POST VATS:    Disposition per Dr. Roxan Hockey.    Signed, Minus Breeding, MD  10/06/2016, 9:29 AM

## 2016-10-07 ENCOUNTER — Ambulatory Visit: Payer: Commercial Managed Care - HMO | Admitting: Nurse Practitioner

## 2016-10-07 ENCOUNTER — Inpatient Hospital Stay (HOSPITAL_COMMUNITY): Payer: Commercial Managed Care - HMO

## 2016-10-07 DIAGNOSIS — R42 Dizziness and giddiness: Secondary | ICD-10-CM

## 2016-10-07 NOTE — Progress Notes (Signed)
Order received to discharge patient.  Patient expresses readiness to discharge.  Discharge instructions, follow up, medications and instructions for their use were discussed with patient and patient voiced understanding.  Telemetry monitor removed and CCMD notified.  PIV access removed without complications

## 2016-10-07 NOTE — Progress Notes (Signed)
Patient Name: Julie Gill Date of Encounter: 10/07/2016  Hospital Problem List     Active Problems:   Adenocarcinoma of lung [facility])    Patient Profile     75 y/o female with h/o atrial fibrillation s/p ablation in 2013, chronic anticoagulation therapy with Xarelto, OSA, bradycardia, HTN, HLD and bilateral pulmonary nodules admitted for VATS + wedge resection with Dr. Roxan Hockey given suspicion for possible adenocarcinoma. Post procedural recovery complicated by recurrent atrial fibrillation.   Subjective   Feels better off of beta blocker.   Inpatient Medications    . acetaminophen  1,000 mg Oral Q6H   Or  . acetaminophen (TYLENOL) oral liquid 160 mg/5 mL  1,000 mg Oral Q6H  . bisacodyl  10 mg Oral Daily  . calcium carbonate  1 tablet Oral Daily  . hydrochlorothiazide  12.5 mg Oral Daily  . lisinopril  20 mg Oral Daily  . magnesium oxide  200 mg Oral Daily  . pantoprazole  40 mg Oral QPM  . pravastatin  40 mg Oral q1800  . rivaroxaban  20 mg Oral Q supper  . senna-docusate  1 tablet Oral QHS    Vital Signs    Vitals:   10/06/16 1409 10/06/16 1943 10/07/16 0503 10/07/16 0529  BP: (!) 150/50 (!) 124/49 (!) 170/55 (!) 145/53  Pulse:  65 72 72  Resp:  18 18   Temp:  98.1 F (36.7 C) 98 F (36.7 C)   TempSrc:  Oral Oral   SpO2:  96% 96%   Weight:      Height:        Intake/Output Summary (Last 24 hours) at 10/07/16 1237 Last data filed at 10/06/16 1848  Gross per 24 hour  Intake              600 ml  Output                0 ml  Net              600 ml   Filed Weights   10/02/16 1159 10/03/16 0329  Weight: 196 lb (88.9 kg) 207 lb 7.3 oz (94.1 kg)     Labs    CBC No results for input(s): WBC, NEUTROABS, HGB, HCT, MCV, PLT in the last 72 hours. Basic Metabolic Panel No results for input(s): NA, K, CL, CO2, GLUCOSE, BUN, CREATININE, CALCIUM, MG, PHOS in the last 72 hours. Liver Function Tests No results for input(s): AST, ALT, ALKPHOS, BILITOT, PROT,  ALBUMIN in the last 72 hours. No results for input(s): LIPASE, AMYLASE in the last 72 hours. Cardiac Enzymes No results for input(s): CKTOTAL, CKMB, CKMBINDEX, TROPONINI in the last 72 hours. BNP Invalid input(s): POCBNP D-Dimer No results for input(s): DDIMER in the last 72 hours. Hemoglobin A1C No results for input(s): HGBA1C in the last 72 hours. Fasting Lipid Panel No results for input(s): CHOL, HDL, LDLCALC, TRIG, CHOLHDL, LDLDIRECT in the last 72 hours. Thyroid Function Tests No results for input(s): TSH, T4TOTAL, T3FREE, THYROIDAB in the last 72 hours.  Invalid input(s): FREET3  Telemetry    Sinus bradycardia  ECG    NA  Radiology    Dg Chest 2 View  Result Date: 10/07/2016 CLINICAL DATA:  Chest tube removal, assess pneumothorax. EXAM: CHEST  2 VIEW COMPARISON:  10/06/2016, 10/05/2016, 10/04/2016 and 10/03/2016 FINDINGS: Interval removal of right IJ central venous catheter. Lungs are adequately inflated and demonstrate mild stable bibasilar opacification likely atelectasis although cannot exclude infection. Stable  postsurgical change over the medial right upper lobe. Again noted is a small right apical pneumothorax improved from post chest tube removal film 10/04/2016 and unchanged from 10/05/2016. Stable small right effusion. Cardiomediastinal silhouette and remainder of the exam is unchanged. IMPRESSION: Small right apical pneumothorax unchanged from 10/05/2016. Stable postsurgical change over the medial right upper lobe. Stable mild bibasilar opacification likely atelectasis, although cannot exclude infection. Stable small right pleural effusion. These results will be called to the ordering clinician or representative by the Radiologist Assistant, and communication documented in the PACS or zVision Dashboard. Electronically Signed   By: Marin Olp M.D.   On: 10/07/2016 09:35   Dg Chest 2 View  Result Date: 10/06/2016 CLINICAL DATA:  Thoracotomy. EXAM: CHEST  2 VIEW  COMPARISON:  10/05/2016. FINDINGS: Right IJ line stable position. Mediastinum is stable. Heart size stable. Persistent postsurgical changes right upper lobe. Previously identified right apical pneumothorax is not definitely identified. Right apical pleural thickening is again noted and is consistent with scarring. Bibasilar atelectasis. Reference made to prior CT report for discussion of other tiny pulmonary nodules present . Tiny right pleural effusion. Right chest wall subcutaneous emphysema again noted. IMPRESSION: 1.  Right IJ line stable position. 2. Postsurgical changes right upper lobe again noted. Previously identified tiny right apical pneumothorax is not definitely identified on today's exam. Stable right apical pleural thickening noted consistent scarring. Small right pleural effusion noted. 3. Stable right chest wall subcutaneous emphysema . Electronically Signed   By: Marcello Moores  Register   On: 10/06/2016 07:25   Dg Chest 2 View  Result Date: 10/05/2016 CLINICAL DATA:  Minor chest pain in mid sternal recent with shortness of breath, chest tube removal, history asthma, hypertension, atrial fibrillation, post VATS with breads resection 10/02/2016 EXAM: CHEST  2 VIEW COMPARISON:  10/04/2016 FINDINGS: RIGHT jugular central venous catheter with tip projecting over SVC. Persistent small RIGHT-sided pneumothorax. Enlargement of cardiac silhouette. Mediastinal contours and pulmonary vascularity normal. Atelectasis and postsurgical changes in the medial RIGHT upper lobe. Mild RIGHT basilar atelectasis. LEFT lung clear. Bones demineralized. IMPRESSION: Persistent small RIGHT pneumothorax. Enlargement of cardiac silhouette. Electronically Signed   By: Lavonia Dana M.D.   On: 10/05/2016 09:11   Dg Chest 2 View  Result Date: 10/02/2016 CLINICAL DATA:  Pre-op respiratory exam for RIGHT VIDEO ASSISTED THORACOSCOPY (VATS) and /WEDGE RESECTION for Bilateral Lung Nodules.Dry cough. Hx HTN, A-Fib with Ablation. Asthma.  Social smoker for 5-6 yrs - quit completely in 1986. EXAM: CHEST  2 VIEW COMPARISON:  05/28/2016 FINDINGS: The cardiac silhouette is normal in size and configuration. No mediastinal or hilar masses. No evidence of adenopathy. Lungs are mildly hyperexpanded but clear. No pleural effusion or pneumothorax. Skeletal structures are intact. IMPRESSION: No active cardiopulmonary disease. Electronically Signed   By: Lajean Manes M.D.   On: 10/02/2016 11:57   Ct Chest Wo Contrast  Result Date: 09/09/2016 CLINICAL DATA:  75 year old female with history of pulmonary nodules. Followup study. EXAM: CT CHEST WITHOUT CONTRAST TECHNIQUE: Multidetector CT imaging of the chest was performed following the standard protocol without IV contrast. COMPARISON:  Chest CT 06/10/2016. FINDINGS: Cardiovascular: Heart size is borderline enlarged. There is no significant pericardial fluid, thickening or pericardial calcification. There is aortic atherosclerosis, as well as atherosclerosis of the great vessels of the mediastinum and the coronary arteries, including calcified atherosclerotic plaque in the left main, left anterior descending, left circumflex and right coronary arteries. Calcifications of the mitral annulus. Mediastinum/Nodes: No pathologically enlarged mediastinal or hilar lymph nodes.  Please note that accurate exclusion of hilar adenopathy is limited on noncontrast CT scans. Esophagus is unremarkable in appearance. No axillary lymphadenopathy. Lungs/Pleura: Several sub solid pulmonary nodules are again noted, most pronounced in the lung apices. Low largest of these is a 2.8 x 2.4 x 2.2 cm ground-glass attenuation nodule (axial image 20 of series 7 and coronal image 88 of series 5), which has a 6 mm solid component in the periphery of the lesion, best appreciated on coronal image 90 of series 5, where there is clear contact with the overlying pleura. Other 6 mm ground-glass attenuation nodules in the left upper lobe are noted  on images 20 and 25 of series 7. 9 mm right lower lobe nodule (image 76 of series 7) is unchanged. 10 mm right lower lobe ground-glass attenuation nodule (image 101 of series 7) is also unchanged. Several other smaller sub cm ground-glass attenuation nodules are also noted and unchanged. No acute consolidative airspace disease. No pleural effusions. Extreme lung bases were incompletely imaged. Upper Abdomen: 12 mm low-attenuation lesion in segment 3 of the liver is incompletely characterized on today's noncontrast CT examination, but is the was previously characterized as a simple cyst. Calcified granulomas in the liver. Aortic atherosclerosis. Musculoskeletal: There are no aggressive appearing lytic or blastic lesions noted in the visualized portions of the skeleton. IMPRESSION: 1. Although the previously noted sub solid pulmonary nodules appear generally unchanged compared to the prior examination, the largest of these nodules in the apex of the right upper lobe has a 6 mm solid component, and overall measures 2.8 x 2.4 x 2.2 cm. This is considered highly suspicious, and because of this, the overall appearance of the lungs is highly concerning for possible multifocal synchronous bronchogenic adenocarcinoma. PET-CT could be considered at this time, but may be falsely negative given that the solid component of the largest nodule is less than 8 mm in size. Alternatively, biopsy of this apical nodule could be considered. Surgical resection of lesions like this can also be considered, however, given the multifocality of findings on today's examination, surgical resection is not recommended for this patient. This recommendation follows the consensus statement: Guidelines for Management of Incidental Pulmonary Nodules Detected on CT Images: From the Fleischner Society 2017; Radiology 2017; 284:228-243. 2. Aortic atherosclerosis, in addition to left main and 3 vessel coronary artery disease. Assessment for potential risk  factor modification, dietary therapy or pharmacologic therapy may be warranted, if clinically indicated. These results were called by telephone at the time of interpretation on 09/09/2016 at 1:23 pm to Dr. Abigail Butts MCNEILL, who verbally acknowledged these results. Electronically Signed   By: Vinnie Langton M.D.   On: 09/09/2016 13:25   Dg Chest Port 1 View  Result Date: 10/04/2016 CLINICAL DATA:  Postop from right lung wedge resection. Chest tube removal. EXAM: PORTABLE CHEST 1 VIEW COMPARISON:  Prior today FINDINGS: Right jugular central venous catheter remains in place. Right chest tube is been removed since previous study. A 10-15% right apical pneumothorax is now seen. Atelectasis or contusion is again seen in the medial right upper lobe in area of surgical staples. Lung fields are otherwise clear. Heart size is stable. IMPRESSION: 10-15% right apical pneumothorax following right chest tube removal. Electronically Signed   By: Earle Gell M.D.   On: 10/04/2016 17:20   Dg Chest Port 1 View  Result Date: 10/04/2016 CLINICAL DATA:  Cough and shortness of Breath EXAM: PORTABLE CHEST 1 VIEW COMPARISON:  10/03/2016 FINDINGS: Cardiac shadow is mildly enlarged  but stable. Right jugular central line and right-sided chest tube are again noted. No pneumothorax is seen. The lungs are well aerated with minimal basilar atelectasis bilaterally. No bony abnormality is noted. IMPRESSION: Stable appearance of the chest.  No acute abnormality noted. Electronically Signed   By: Inez Catalina M.D.   On: 10/04/2016 08:14   Dg Chest Port 1 View  Result Date: 10/03/2016 CLINICAL DATA:  Postoperative for lung carcinoma. Chest tube in place EXAM: PORTABLE CHEST 1 VIEW COMPARISON:  October 02, 2016 FINDINGS: There is postoperative change on the right. There is soft tissue air on the right but no evident pneumothorax. Chest tube position is unchanged. Central catheter tip is in the superior vena cava. There is new atelectasis in  the right base. The lungs elsewhere are clear. Heart is mildly prominent with pulmonary vascularity within normal limits. No adenopathy. There is atherosclerotic calcification in the aorta. 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 IMPRESSION: Tube and catheter positions as described without pneumothorax. There is soft tissue air on the right. There is new atelectasis right base. Lungs elsewhere clear. Stable cardiac silhouette. There is aortic atherosclerosis. Electronically Signed   By: Lowella Grip III M.D.   On: 10/03/2016 08:01   Dg Chest Port 1 View  Result Date: 10/02/2016 CLINICAL DATA:  Follow-up lung adenocarcinoma.  Initial encounter. EXAM: PORTABLE CHEST 1 VIEW COMPARISON:  Chest radiograph performed earlier today at 11:39 a.m. FINDINGS: Vascular congestion is noted. Focal right upper lobe opacity is seen, status post video-assisted thoracoscopy and wedge resection. A small left pleural effusion is suggested. A right apical chest tube is noted. The patient's right IJ line is noted ending about the mid to distal SVC. The cardiomediastinal silhouette is mildly enlarged. No acute osseous abnormalities are seen. IMPRESSION: 1. Focal right upper lobe opacity, status post video-assisted thoracoscopy and wedge resection. Small left pleural effusion suggested. 2. Vascular congestion and mild cardiomegaly. Electronically Signed   By: Garald Balding M.D.   On: 10/02/2016 18:20    Assessment & Plan    SINUS BRADYCARDIA:    Beta blocker discontinued.  Has appt with December with [name].  Follow up long term with Dr. Rayann Heman.    ATRIAL FIB:  On Xarelto.  Now in NSR.  Was in atrial fib post up.    STATUS POST VATS:    Disposition per Dr. Roxan Hockey.    Signed, Minus Breeding, MD  10/07/2016, 12:37 PM

## 2016-10-07 NOTE — Consult Note (Signed)
            [facility] CM Primary Care Navigator  10/07/2016  Julie Gill [birth year] [id number]   Patient seen at the bedside to identify possible discharge needs.  Patient stated that a CT to evaluate her kidney had revealed abnormalities to base of her right lung that resulted to further tests showing an abnormal growth to right upper lung; she was also having dry coughing and feeling fatigued which all led to this admission/ surgery. Plan for discharge is to go home per patient.  Patient confirms that primary care provider is Dr. [name] with [facility]. Patient verbalized being independent with care prior to admission and is able to drive as well.  Transportation to doctors' appointments will be provided by husband [name]) as stated by patient.  Patient states using [facility] for regular prescriptions and [facility] at [facility] to obtain medications with no difficulty. She states doing her own medication management at home using "pill box" system. Husband will be the primary caregiver at home and daughter [name]) can also assist with care if needed.  Patient had expressed understanding to call primary care provider's office once discharged, for a post discharge follow-up appointment within a week or sooner if needs arise.  Patient letter provided as her reminder.  Patient denies any further needs or concerns at this time.    For additional questions please contact:  Edwena Felty A. Masai Kidd, BSN, RN-BC [facility] PRIMARY CARE Navigator Cell: [phone]

## 2016-10-07 NOTE — Care Management Note (Signed)
Case Management Note  Patient Details  Name: Julie Gill MRN: [id number] Date of Birth: 1941-10-25  Subjective/Objective:     Right VATS/wedge resection              Action/Plan: Discharge Planning: AVS reviewed: NCM spoke to pt at bedside. Pt's husband at home to assist with care. Dtr, Graciella Freer will assist also. Can afford medications. Husband will pick up shower bench from [facility].  PCP Theadore Nan MD   Expected Discharge Date:  10/07/2016              Expected Discharge Plan:  Home/Self Care  In-House Referral:  NA  Discharge planning Services  CM Consult  Post Acute Care Choice:  NA Choice offered to:  NA  DME Arranged:  N/A DME Agency:  NA  HH Arranged:  NA HH Agency:  NA  Status of Service:  Completed, signed off  If discussed at [place] of Stay Meetings, dates discussed:    Additional Comments:  Erenest Rasher, RN 10/07/2016, 11:11 AM

## 2016-10-07 NOTE — Progress Notes (Addendum)
      [address]       [place] [address]             [phone]       5 Days Post-Op Procedure(s) (LRB): VIDEO ASSISTED THORACOSCOPY (VATS)/WEDGE RESECTION (Right)  Subjective: Patient informed her cancer was "invasive" and she thought it meant it spread everywhere. I tried to explain what this means as she was very upset about it.  Objective: Vital signs in last 24 hours: Temp:  [97.7 F (36.5 C)-98.1 F (36.7 C)] 98 F (36.7 C) (11/07 0503) Pulse Rate:  [65-72] 72 (11/07 0529) Cardiac Rhythm: Normal sinus rhythm (11/06 2048) Resp:  [18-19] 18 (11/07 0503) BP: (124-170)/(49-55) 145/53 (11/07 0529) SpO2:  [96 %] 96 % (11/07 0503)   Intake/Output from previous day: 11/06 0701 - 11/07 0700 In: 840 [P.O.:840] Out: -    Physical Exam:  Cardiovascular: RRR Pulmonary: Clear to auscultation bilaterally Abdomen: Soft, non tender, bowel sounds present. Wounds: Clean and dry.  No erythema or signs of infection.   Lab Results: CBC:No results for input(s): WBC, HGB, HCT, PLT in the last 72 hours. BMET: No results for input(s): NA, K, CL, CO2, GLUCOSE, BUN, CREATININE, CALCIUM in the last 72 hours.  PT/INR: No results for input(s): LABPROT, INR in the last 72 hours. ABG:  INR: Will add last result for INR, ABG once components are confirmed Will add last 4 CBG results once components are confirmed  Assessment/Plan:  1. CV - SB/SR. On HCTZ 12.5 mg daily, Lisinopril 20 mg, and Xarelto 20 mg daily. Will increase Lisinopril to pre op dose of 40 mg daily for better BP control. 2.  Pulmonary - On room air. Encourage incentive spirometer. CXR this am shows appears stable. Encourage incentive spirometer. 3. If CXR stable, discharge    ZIMMERMAN,DONIELLE MPA-C 10/07/2016,8:12 AM Patient seen and examined, agree with above Again explained the difference between invasive and metastatic She is medically ready for [employer] C. Roxan Hockey, MD [facility] [phone]

## 2016-10-10 ENCOUNTER — Encounter: Payer: Self-pay | Admitting: *Deleted

## 2016-10-10 NOTE — Progress Notes (Signed)
Oncology Nurse Navigator Documentation  Oncology Nurse Navigator Flowsheets 10/10/2016  Navigator Encounter Type Other/per cancer conference, tissue requested to be sent for foundation one testing.   Barriers/Navigation Needs Coordination of Care  Interventions Coordination of Care  Coordination of Care Other  Acuity Level 1  Time Spent with Patient 30

## 2016-10-14 ENCOUNTER — Other Ambulatory Visit: Payer: Self-pay

## 2016-10-14 DIAGNOSIS — G8918 Other acute postprocedural pain: Secondary | ICD-10-CM

## 2016-10-14 MED ORDER — TRAMADOL HCL 50 MG PO TABS: 50.0000 mg | ORAL_TABLET | Freq: Four times a day (QID) | ORAL | 0 refills | Status: DC | PRN

## 2016-10-14 NOTE — Telephone Encounter (Signed)
RX refill for Tramadol was FAX to [facility]

## 2016-10-15 ENCOUNTER — Telehealth: Payer: Self-pay | Admitting: Internal Medicine

## 2016-10-15 ENCOUNTER — Encounter: Payer: Self-pay | Admitting: Internal Medicine

## 2016-10-15 NOTE — Telephone Encounter (Signed)
A tc was originally made to the pt to scheduled. Left a vm on her phone. Cld the pt's daughter to schedule her mom an appt. Appt scheduled w/Mohamed on 11/27, 145pm labs, 215pm MD appt. She agreed. She will notify her mother of the appt.

## 2016-10-21 ENCOUNTER — Ambulatory Visit: Payer: Commercial Managed Care - HMO | Admitting: Thoracic Surgery (Cardiothoracic Vascular Surgery)

## 2016-10-22 ENCOUNTER — Other Ambulatory Visit: Payer: Self-pay | Admitting: Medical Oncology

## 2016-10-22 DIAGNOSIS — C3491 Malignant neoplasm of unspecified part of right bronchus or lung: Secondary | ICD-10-CM

## 2016-10-27 ENCOUNTER — Ambulatory Visit (HOSPITAL_BASED_OUTPATIENT_CLINIC_OR_DEPARTMENT_OTHER): Payer: Commercial Managed Care - HMO | Admitting: Internal Medicine

## 2016-10-27 ENCOUNTER — Telehealth: Payer: Self-pay | Admitting: Internal Medicine

## 2016-10-27 ENCOUNTER — Encounter: Payer: Self-pay | Admitting: Internal Medicine

## 2016-10-27 ENCOUNTER — Other Ambulatory Visit (HOSPITAL_BASED_OUTPATIENT_CLINIC_OR_DEPARTMENT_OTHER): Payer: Commercial Managed Care - HMO

## 2016-10-27 DIAGNOSIS — C3491 Malignant neoplasm of unspecified part of right bronchus or lung: Secondary | ICD-10-CM | POA: Diagnosis not present

## 2016-10-27 DIAGNOSIS — G8918 Other acute postprocedural pain: Secondary | ICD-10-CM | POA: Diagnosis not present

## 2016-10-27 DIAGNOSIS — I10 Essential (primary) hypertension: Secondary | ICD-10-CM | POA: Diagnosis not present

## 2016-10-27 DIAGNOSIS — I4891 Unspecified atrial fibrillation: Secondary | ICD-10-CM | POA: Diagnosis not present

## 2016-10-27 LAB — COMPREHENSIVE METABOLIC PANEL
ALT: 30 U/L (ref 0–55)
AST: 28 U/L (ref 5–34)
Albumin: 3.6 g/dL (ref 3.5–5.0)
Alkaline Phosphatase: 97 U/L (ref 40–150)
Anion Gap: 11 mEq/L (ref 3–11)
BUN: 19.4 mg/dL (ref 7.0–26.0)
CO2: 24 mEq/L (ref 22–29)
Calcium: 10 mg/dL (ref 8.4–10.4)
Chloride: 105 mEq/L (ref 98–109)
Creatinine: 1.2 mg/dL — ABNORMAL HIGH (ref 0.6–1.1)
EGFR: 47 mL/min/{1.73_m2} — ABNORMAL LOW (ref >90)
Glucose: 141 mg/dl — ABNORMAL HIGH (ref 70–140)
Potassium: 4.7 mEq/L (ref 3.5–5.1)
Sodium: 140 mEq/L (ref 136–145)
Total Bilirubin: 0.25 mg/dL (ref 0.20–1.20)
Total Protein: 6.9 g/dL (ref 6.4–8.3)

## 2016-10-27 LAB — CBC WITH DIFFERENTIAL/PLATELET
BASO%: 0.1 % (ref 0.0–2.0)
Basophils Absolute: 0 10*3/uL (ref 0.0–0.1)
EOS%: 2.2 % (ref 0.0–7.0)
Eosinophils Absolute: 0.2 10*3/uL (ref 0.0–0.5)
HCT: 37.3 % (ref 34.8–46.6)
HGB: 12.2 g/dL (ref 11.6–15.9)
LYMPH%: 34.8 % (ref 14.0–49.7)
MCH: 29.7 pg (ref 25.1–34.0)
MCHC: 32.7 g/dL (ref 31.5–36.0)
MCV: 90.8 fL (ref 79.5–101.0)
MONO#: 0.3 10*3/uL (ref 0.1–0.9)
MONO%: 5 % (ref 0.0–14.0)
NEUT#: 4 10*3/uL (ref 1.5–6.5)
NEUT%: 57.9 % (ref 38.4–76.8)
Platelets: 266 10*3/uL (ref 145–400)
RBC: 4.11 10*6/uL (ref 3.70–5.45)
RDW: 13.6 % (ref 11.2–14.5)
WBC: 6.8 10*3/uL (ref 3.9–10.3)
lymph#: 2.4 10*3/uL (ref 0.9–3.3)

## 2016-10-27 MED ORDER — TRAMADOL HCL 50 MG PO TABS: 50.0000 mg | ORAL_TABLET | Freq: Four times a day (QID) | ORAL | 0 refills | Status: DC | PRN

## 2016-10-27 NOTE — Progress Notes (Signed)
[place] [phone]   [phone]  CONSULT NOTE  REFERRING PHYSICIAN: Dr. Modesto Charon.  REASON FOR CONSULTATION:  75 years old white female recently diagnosed with lung cancer.  HPI Julie Gill is a 75 y.o. female with past medical history significant for atrial fibrillation, hypertension, colon polyps, dyslipidemia, GERD as well as restless leg syndrome. The patient also has a remote history of smoking for few years. The patient mentions that in July 2017 she has hematuria and left flank pain. She had CT scanning of the abdomen for evaluation of renal stone performed on 05/31/2016 and it showed no urinary tract calculi but there was 1.2 cm solid nodule in the right lower lobe. This was followed by CT scan of the chest with contrast on 06/10/2016 and it showed multiple groundglass attenuation nodules including groundglass nodule in the right upper lobe measuring 2.8 cm in diameter, 1.0 cm groundglass nodule in the right lung base, 0.9 cm right lower lobe ground glass nodule, 0.5 cm Solid nodule in the left lower lobe as well as a subpleural nodule in the left lower lobe measuring 0.5 cm and a groundglass nodule in the left upper lobe measuring 0.6 cm. The patient was followed by observation and repeat CT scan of the chest without contrast on 09/09/2016 showed the previously noted subcentimeter pulmonary nodules appear generally unchanged compared to the prior examination but the largest of these nodules in the apex of the left upper lobe had a 0.6 cm solid component and although measures 2.8 x 2.4 x 2.2 cm. This is considered highly suspicious for malignancy and the overall appearance of the lung is highly concerning for possible multifocal synchronous bronchogenic adenocarcinoma. The patient was referred to Dr. Roxan Hockey and on 10/02/2016 she underwent a right VATS with wedge resection of the right upper lobe. The final pathology ([id number])  showed invasive well-differentiated adenocarcinoma spanning 1.7 cm. There was adenocarcinoma involving the pleural surface. The surgical resection margins were negative for carcinoma. Dr. Roxan Hockey kindly referred the patient to me today for further evaluation and recommendation regarding her condition. When seen today she is feeling fine except for soreness in the right side of the chest after the surgical resection. She has no shortness breath but continues to have mild dry cough with no hemoptysis. She denied having any nausea, vomiting, diarrhea but has constipation secondary to narcotic treatment. She lost around 12 pounds in the last few weeks after the surgery because of lack of appetite. She has some anxiety but denied having any headache or visual changes. Family history significant for mother with congestive heart failure, father had lung cancer at age 43. The patient is married and has 2 children. She was accompanied today by her husband Julie Gill and her daughter Julie Gill. She is currently retired and used to work at [employer] and also for [employer]. She has a remote history of smoking for few years but quit long time ago. She has no history of alcohol or drug abuse.   HPI  Past Medical History:  Diagnosis Date  . Anxiety   . Arthritis   . Asthma    dx 10 yrs ago  . Atrial fibrillation (HCC)    CHADS VASC score 3. Intolerant to flecainide. s/p afib ablation 02/12/12  . Colon polyps   . Diastolic dysfunction   . Dysrhythmia    a fib   dx 2014  . GERD (gastroesophageal reflux disease)   . History of kidney infection  last one was approx 2009  . Hyperlipidemia   . Hypertension   . Mild sleep apnea    wears NO equipment  . Mitral regurgitation    mild  . Neuromuscular disorder ([place])   . Obesity   . Plantar fasciitis   . Restless leg syndrome   . Sinus bradycardia   . Sleep apnea   . Stress bladder incontinence, female    WEARS SMALL PADS    Past Surgical History:    Procedure Laterality Date  . ABDOMINAL HYSTERECTOMY  1992   BSO  . APPENDECTOMY    . atrial fibrillation ablation  02/12/12   PVI by Dr Rayann Heman  . ATRIAL FIBRILLATION ABLATION N/A 02/12/2012   Procedure: ATRIAL FIBRILLATION ABLATION;  Surgeon: Thompson Grayer, MD;  Location[facility] CATH LAB;  Service: Cardiovascular;  Laterality: N/A;  . BLADDER SUSPENSION    . [place], 200,2004, 2007, 05/06/2011   adenomas  . heels spurs     x 2  . TEE WITHOUT CARDIOVERSION  02/11/2012   Procedure: TRANSESOPHAGEAL ECHOCARDIOGRAM (TEE);  Surgeon: Larey Dresser, MD;  Location: [place];  Service: Cardiovascular;  Laterality: N/A;  . TONSILLECTOMY AND ADENOIDECTOMY    . VARICOSE VEIN SURGERY    . VIDEO ASSISTED THORACOSCOPY (VATS)/WEDGE RESECTION Right 10/02/2016   Procedure: VIDEO ASSISTED THORACOSCOPY (VATS)/WEDGE RESECTION;  Surgeon: [name], MD;  Location: [place];  Service: Thoracic;  Laterality: Right;    Family History  Problem Relation Age of Onset  . Colon cancer Maternal Uncle   . Colon cancer Maternal Grandmother   . Congestive Heart Failure Mother   . Lung cancer Father   . Diabetes Brother   . Hypertension Brother     Social History Social History  Substance Use Topics  . Smoking status: Former Smoker    Types: Cigarettes    Quit date: 09/17/1985  . Smokeless tobacco: Never Used     Comment: 5 CIG /DAY  . Alcohol use No    Allergies  Allergen Reactions  . Codeine Other (See Comments)    Skin crawls, jittery, agitated  . Prednisone Other (See Comments)    Tachycardia, light headed, rash    Current Outpatient Prescriptions  Medication Sig Dispense Refill  . calcium carbonate (OS-CAL) 600 MG TABS Take 600 mg by mouth daily.     . Cholecalciferol (VITAMIN D PO) Take 4,000 Units by mouth daily.      . clonazePAM (KLONOPIN) 0.5 MG tablet Take 0.25 mg by mouth 2 (two) times daily as needed for anxiety.     . fish oil-omega-3 fatty acids 1000 MG  capsule Take 1 g by mouth 2 (two) times daily.     . hydrochlorothiazide (HYDRODIURIL) 25 MG tablet TAKE 1/2 TABLET EVERY DAY (NEED AN APPT WITH LORI EHUDJSHF) (Patient taking differently: TAKE 12.5 MG BY MOUTH EVERY DAY (NEED AN APPT WITH LORI GERHARDT)) 45 tablet 3  . lisinopril (PRINIVIL,ZESTRIL) 40 MG tablet Take 40 mg by mouth daily.    Marland Kitchen lovastatin (MEVACOR) 40 MG tablet Take 80 mg by mouth daily.     Marland Kitchen MAGNESIUM PO Take 250 mg by mouth daily.    . Multiple Vitamins-Minerals (MULTIVITAMIN WITH MINERALS) tablet Take 1 tablet by mouth daily.      . potassium chloride SA (K-DUR,KLOR-CON) 20 MEQ tablet Take 20 mEq by mouth daily.    Marland Kitchen senna (SENOKOT) 8.6 MG tablet Take 1 tablet by mouth daily.     . traMADol Veatrice Bourbon)  50 MG tablet Take 1 tablet (50 mg total) by mouth every 6 (six) hours as needed for moderate pain (mild pain). 30 tablet 0  . VENTOLIN HFA 108 (90 Base) MCG/ACT inhaler Inhale 2 puffs into the lungs every 6 (six) hours as needed for wheezing or shortness of breath.     . vitamin E (VITAMIN E) 400 UNIT capsule Take 400 Units by mouth daily.     Alveda Reasons 20 MG TABS tablet Take 20 mg by mouth daily with supper.     . nitroGLYCERIN (NITROSTAT) 0.4 MG SL tablet Place 1 tablet (0.4 mg total) under the tongue every 5 (five) minutes as needed for chest pain. (Patient not taking: Reported on 10/27/2016) 25 tablet 3  . pantoprazole (PROTONIX) 40 MG tablet Take 1 tablet (40 mg total) by mouth daily. (Patient taking differently: Take 40 mg by mouth every evening. ) 30 tablet 11   No current facility-administered medications for this visit.     Review of Systems  Constitutional: positive for anorexia and weight loss Eyes: negative Ears, nose, mouth, throat, and face: negative Respiratory: positive for cough Cardiovascular: negative Gastrointestinal: negative Genitourinary:negative Integument/breast: negative Hematologic/lymphatic: negative Musculoskeletal:negative Neurological:  negative Behavioral/Psych: positive for anxiety Endocrine: negative Allergic/Immunologic: negative  Physical Exam  DPO:EUMPN, healthy, no distress, well nourished, well developed and anxious SKIN: skin color, texture, turgor are normal, no rashes or significant lesions HEAD: Normocephalic, No masses, lesions, tenderness or abnormalities EYES: normal, PERRLA, Conjunctiva are pink and non-injected EARS: External ears normal, Canals clear OROPHARYNX:no exudate, no erythema and lips, buccal mucosa, and tongue normal  NECK: supple, no adenopathy, no JVD LYMPH:  no palpable lymphadenopathy, no hepatosplenomegaly BREAST:not examined LUNGS: clear to auscultation , and palpation HEART: regular rate & rhythm, no murmurs and no gallops ABDOMEN:abdomen soft and non-tender BACK: Back symmetric, no curvature., No CVA tenderness EXTREMITIES:no joint deformities, effusion, or inflammation, no edema, no skin discoloration  NEURO: alert & oriented x 3 with fluent speech, no focal motor/sensory deficits  PERFORMANCE STATUS: ECOG 1  LABORATORY DATA: Lab Results  Component Value Date   WBC 6.8 10/27/2016   HGB 12.2 10/27/2016   HCT 37.3 10/27/2016   MCV 90.8 10/27/2016   PLT 266 10/27/2016      Chemistry      Component Value Date/Time   NA 140 10/27/2016 1344   K 4.7 10/27/2016 1344   CL 104 10/04/2016 0428   CO2 24 10/27/2016 1344   BUN 19.4 10/27/2016 1344   CREATININE 1.2 (H) 10/27/2016 1344      Component Value Date/Time   CALCIUM 10.0 10/27/2016 1344   ALKPHOS 97 10/27/2016 1344   AST 28 10/27/2016 1344   ALT 30 10/27/2016 1344   BILITOT 0.25 10/27/2016 1344       RADIOGRAPHIC STUDIES: Dg Chest 2 View  Result Date: 10/07/2016 CLINICAL DATA:  Chest tube removal, assess pneumothorax. EXAM: CHEST  2 VIEW COMPARISON:  10/06/2016, 10/05/2016, 10/04/2016 and 10/03/2016 FINDINGS: Interval removal of right IJ central venous catheter. Lungs are adequately inflated and demonstrate  mild stable bibasilar opacification likely atelectasis although cannot exclude infection. Stable postsurgical change over the medial right upper lobe. Again noted is a small right apical pneumothorax improved from post chest tube removal film 10/04/2016 and unchanged from 10/05/2016. Stable small right effusion. Cardiomediastinal silhouette and remainder of the exam is unchanged. IMPRESSION: Small right apical pneumothorax unchanged from 10/05/2016. Stable postsurgical change over the medial right upper lobe. Stable mild bibasilar opacification likely atelectasis, although cannot  exclude infection. Stable small right pleural effusion. These results will be called to the ordering clinician or representative by the Radiologist Assistant, and communication documented in the PACS or zVision Dashboard. Electronically Signed   By: Marin Olp M.D.   On: 10/07/2016 09:35   Dg Chest 2 View  Result Date: 10/06/2016 CLINICAL DATA:  Thoracotomy. EXAM: CHEST  2 VIEW COMPARISON:  10/05/2016. FINDINGS: Right IJ line stable position. Mediastinum is stable. Heart size stable. Persistent postsurgical changes right upper lobe. Previously identified right apical pneumothorax is not definitely identified. Right apical pleural thickening is again noted and is consistent with scarring. Bibasilar atelectasis. Reference made to prior CT report for discussion of other tiny pulmonary nodules present . Tiny right pleural effusion. Right chest wall subcutaneous emphysema again noted. IMPRESSION: 1.  Right IJ line stable position. 2. Postsurgical changes right upper lobe again noted. Previously identified tiny right apical pneumothorax is not definitely identified on today's exam. Stable right apical pleural thickening noted consistent scarring. Small right pleural effusion noted. 3. Stable right chest wall subcutaneous emphysema . Electronically Signed   By: Marcello Moores  Register   On: 10/06/2016 07:25   Dg Chest 2 View  Result Date:  10/05/2016 CLINICAL DATA:  Minor chest pain in mid sternal recent with shortness of breath, chest tube removal, history asthma, hypertension, atrial fibrillation, post VATS with breads resection 10/02/2016 EXAM: CHEST  2 VIEW COMPARISON:  10/04/2016 FINDINGS: RIGHT jugular central venous catheter with tip projecting over SVC. Persistent small RIGHT-sided pneumothorax. Enlargement of cardiac silhouette. Mediastinal contours and pulmonary vascularity normal. Atelectasis and postsurgical changes in the medial RIGHT upper lobe. Mild RIGHT basilar atelectasis. LEFT lung clear. Bones demineralized. IMPRESSION: Persistent small RIGHT pneumothorax. Enlargement of cardiac silhouette. Electronically Signed   By: Lavonia Dana M.D.   On: 10/05/2016 09:11   Dg Chest 2 View  Result Date: 10/02/2016 CLINICAL DATA:  Pre-op respiratory exam for RIGHT VIDEO ASSISTED THORACOSCOPY (VATS) and /WEDGE RESECTION for Bilateral Lung Nodules.Dry cough. Hx HTN, A-Fib with Ablation. Asthma. Social smoker for 5-6 yrs - quit completely in 1986. EXAM: CHEST  2 VIEW COMPARISON:  05/28/2016 FINDINGS: The cardiac silhouette is normal in size and configuration. No mediastinal or hilar masses. No evidence of adenopathy. Lungs are mildly hyperexpanded but clear. No pleural effusion or pneumothorax. Skeletal structures are intact. IMPRESSION: No active cardiopulmonary disease. Electronically Signed   By: Lajean Manes M.D.   On: 10/02/2016 11:57   Dg Chest Port 1 View  Result Date: 10/04/2016 CLINICAL DATA:  Postop from right lung wedge resection. Chest tube removal. EXAM: PORTABLE CHEST 1 VIEW COMPARISON:  Prior today FINDINGS: Right jugular central venous catheter remains in place. Right chest tube is been removed since previous study. A 10-15% right apical pneumothorax is now seen. Atelectasis or contusion is again seen in the medial right upper lobe in area of surgical staples. Lung fields are otherwise clear. Heart size is stable. IMPRESSION:  10-15% right apical pneumothorax following right chest tube removal. Electronically Signed   By: Earle Gell M.D.   On: 10/04/2016 17:20   Dg Chest Port 1 View  Result Date: 10/04/2016 CLINICAL DATA:  Cough and shortness of Breath EXAM: PORTABLE CHEST 1 VIEW COMPARISON:  10/03/2016 FINDINGS: Cardiac shadow is mildly enlarged but stable. Right jugular central line and right-sided chest tube are again noted. No pneumothorax is seen. The lungs are well aerated with minimal basilar atelectasis bilaterally. No bony abnormality is noted. IMPRESSION: Stable appearance of the chest.  No  acute abnormality noted. Electronically Signed   By: Inez Catalina M.D.   On: 10/04/2016 08:14   Dg Chest Port 1 View  Result Date: 10/03/2016 CLINICAL DATA:  Postoperative for lung carcinoma. Chest tube in place EXAM: PORTABLE CHEST 1 VIEW COMPARISON:  October 02, 2016 FINDINGS: There is postoperative change on the right. There is soft tissue air on the right but no evident pneumothorax. Chest tube position is unchanged. Central catheter tip is in the superior vena cava. There is new atelectasis in the right base. The lungs elsewhere are clear. Heart is mildly prominent with pulmonary vascularity within normal limits. No adenopathy. There is atherosclerotic calcification in the aorta. 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 IMPRESSION: Tube and catheter positions as described without pneumothorax. There is soft tissue air on the right. There is new atelectasis right base. Lungs elsewhere clear. Stable cardiac silhouette. There is aortic atherosclerosis. Electronically Signed   By: Lowella Grip III M.D.   On: 10/03/2016 08:01   Dg Chest Port 1 View  Result Date: 10/02/2016 CLINICAL DATA:  Follow-up lung adenocarcinoma.  Initial encounter. EXAM: PORTABLE CHEST 1 VIEW COMPARISON:  Chest radiograph performed earlier today at 11:39 a.m. FINDINGS: Vascular congestion is noted. Focal right upper lobe opacity is  seen, status post video-assisted thoracoscopy and wedge resection. A small left pleural effusion is suggested. A right apical chest tube is noted. The patient's right IJ line is noted ending about the mid to distal SVC. The cardiomediastinal silhouette is mildly enlarged. No acute osseous abnormalities are seen. IMPRESSION: 1. Focal right upper lobe opacity, status post video-assisted thoracoscopy and wedge resection. Small left pleural effusion suggested. 2. Vascular congestion and mild cardiomegaly. Electronically Signed   By: Garald Balding M.D.   On: 10/02/2016 18:20    ASSESSMENT: This is a very pleasant 75 years old white female recently diagnosed with a stage Ib (T1c, N0, M0) non-small cell lung cancer, adenocarcinoma but the patient also has multifocal groundglass opacities in the lung bilaterally highly concerning for low-grade adenocarcinoma as well. She is status post wedge resection of the right upper lobe on 10/02/2016   PLAN: I had a lengthy discussion with the patient and her family about her current disease stage, prognosis and treatment options. I showed them the images of the CT scan of the chest and the multifocal abnormalities in the lungs bilaterally. The tissue block was sent to [place] one for molecular studies but the results are still pending. I recommended for the patient continuous observation for now and close monitoring of the multifocal groundglass opacities in the lungs. If the patient has any evidence for disease progression in any of the other pulmonary nodules she may be considered for systemic therapy either with targeted therapy if she has an actionable mutation or stereotactic body radiotherapy for metastatic disease. The patient and her family agreed to the current plan. I will see her back for follow-up visit in 6 months with repeat CT scan of the chest. For pain management, I gave her a refill of tramadol for now until she sees Dr. Roxan Hockey. She was advised  to call immediately if she has any concerning symptoms in the interval. The patient voices understanding of current disease status and treatment options and is in agreement with the current care plan.  All questions were answered. The patient knows to call the clinic with any problems, questions or concerns. We can certainly see the patient much sooner if necessary.  Thank you so much for  allowing me to participate in the care of Julie Gill. I will continue to follow up the patient with you and assist in her care.  I spent 40 minutes counseling the patient face to face. The total time spent in the appointment was 60 minutes.  Disclaimer: This note was dictated with voice recognition software. Similar sounding words can inadvertently be transcribed and may not be corrected upon review.   Anorah Trias K. October 27, 2016, 3:29 PM

## 2016-10-27 NOTE — Telephone Encounter (Signed)
Appointments scheduled per 11/27 LOS. Patient given AVS report and calendars with future scheduled appointments.

## 2016-10-31 ENCOUNTER — Encounter (HOSPITAL_COMMUNITY): Payer: Self-pay

## 2016-10-31 DIAGNOSIS — F419 Anxiety disorder, unspecified: Secondary | ICD-10-CM | POA: Diagnosis not present

## 2016-10-31 DIAGNOSIS — C3491 Malignant neoplasm of unspecified part of right bronchus or lung: Secondary | ICD-10-CM | POA: Diagnosis not present

## 2016-11-03 ENCOUNTER — Other Ambulatory Visit: Payer: Self-pay | Admitting: Thoracic Surgery (Cardiothoracic Vascular Surgery)

## 2016-11-03 ENCOUNTER — Encounter: Payer: Self-pay | Admitting: *Deleted

## 2016-11-03 DIAGNOSIS — C3490 Malignant neoplasm of unspecified part of unspecified bronchus or lung: Secondary | ICD-10-CM

## 2016-11-03 NOTE — Progress Notes (Signed)
Oncology Nurse Navigator Documentation  Oncology Nurse Navigator Flowsheets 11/03/2016  Navigator Encounter Type Other/Updated Dr. Julien Nordmann on [place] [facility] results.   Treatment Phase Post-Tx Follow-up  Barriers/Navigation Needs Coordination of Care  Interventions Coordination of Care  Coordination of Care Other  Acuity Level 1  Time Spent with Patient 15

## 2016-11-04 ENCOUNTER — Ambulatory Visit
Admission: RE | Admit: 2016-11-04 | Discharge: 2016-11-04 | Disposition: A | Discharge status: Home / Self Care | Payer: Commercial Managed Care - HMO | Source: Ambulatory Visit | Attending: Thoracic Surgery (Cardiothoracic Vascular Surgery) | Admitting: Thoracic Surgery (Cardiothoracic Vascular Surgery)

## 2016-11-04 ENCOUNTER — Ambulatory Visit: Payer: Commercial Managed Care - HMO | Admitting: Nurse Practitioner

## 2016-11-04 ENCOUNTER — Ambulatory Visit (INDEPENDENT_AMBULATORY_CARE_PROVIDER_SITE_OTHER): Payer: Self-pay | Admitting: Thoracic Surgery (Cardiothoracic Vascular Surgery)

## 2016-11-04 ENCOUNTER — Encounter: Payer: Self-pay | Admitting: Thoracic Surgery (Cardiothoracic Vascular Surgery)

## 2016-11-04 VITALS — BP 138/70 | HR 84 | Resp 16 | Ht 63.0 in | Wt 195.0 lb

## 2016-11-04 DIAGNOSIS — Z09 Encounter for follow-up examination after completed treatment for conditions other than malignant neoplasm: Secondary | ICD-10-CM

## 2016-11-04 DIAGNOSIS — R918 Other nonspecific abnormal finding of lung field: Secondary | ICD-10-CM | POA: Diagnosis not present

## 2016-11-04 DIAGNOSIS — C73 Malignant neoplasm of thyroid gland: Secondary | ICD-10-CM

## 2016-11-04 DIAGNOSIS — C3490 Malignant neoplasm of unspecified part of unspecified bronchus or lung: Secondary | ICD-10-CM

## 2016-11-04 NOTE — Progress Notes (Signed)
  HPI:  Patient returns for routine postoperative follow-up having undergone  The patient's early postoperative recovery while in the hospital was notable for Since hospital discharge the patient reports   Current Outpatient Prescriptions  Medication Sig Dispense Refill  . calcium carbonate (OS-CAL) 600 MG TABS Take 600 mg by mouth daily.     . Cholecalciferol (VITAMIN D PO) Take 4,000 Units by mouth daily.      . clonazePAM (KLONOPIN) 0.5 MG tablet Take 0.25 mg by mouth 2 (two) times daily as needed for anxiety.     . fish oil-omega-3 fatty acids 1000 MG capsule Take 1 g by mouth 2 (two) times daily.     . hydrochlorothiazide (HYDRODIURIL) 25 MG tablet TAKE 1/2 TABLET EVERY DAY (NEED AN APPT WITH LORI [id number]) (Patient taking differently: TAKE 12.5 MG BY MOUTH EVERY DAY (NEED AN APPT WITH LORI GERHARDT)) 45 tablet 3  . lisinopril (PRINIVIL,ZESTRIL) 40 MG tablet Take 40 mg by mouth daily.    Marland Kitchen lovastatin (MEVACOR) 40 MG tablet Take 80 mg by mouth daily.     Marland Kitchen MAGNESIUM PO Take 250 mg by mouth daily.    . Multiple Vitamins-Minerals (MULTIVITAMIN WITH MINERALS) tablet Take 1 tablet by mouth daily.      . nitroGLYCERIN (NITROSTAT) 0.4 MG SL tablet Place 1 tablet (0.4 mg total) under the tongue every 5 (five) minutes as needed for chest pain. 25 tablet 3  . pantoprazole (PROTONIX) 40 MG tablet Take 1 tablet (40 mg total) by mouth daily. (Patient taking differently: Take 40 mg by mouth every evening. ) 30 tablet 11  . potassium chloride SA (K-DUR,KLOR-CON) 20 MEQ tablet Take 20 mEq by mouth daily.    Marland Kitchen senna (SENOKOT) 8.6 MG tablet Take 1 tablet by mouth daily.     . traMADol (ULTRAM) 50 MG tablet Take 1 tablet (50 mg total) by mouth every 6 (six) hours as needed for moderate pain (mild pain). 30 tablet 0  . VENTOLIN HFA 108 (90 Base) MCG/ACT inhaler Inhale 2 puffs into the lungs every 6 (six) hours as needed for wheezing or shortness of breath.     . vitamin E (VITAMIN E) 400 UNIT capsule Take  400 Units by mouth daily.     Alveda Reasons 20 MG TABS tablet Take 20 mg by mouth daily with supper.      No current facility-administered medications for this visit.     Physical Exam  Diagnostic Tests:   Impression:  Plan:   [name], MD [facility] [phone]

## 2016-11-04 NOTE — Progress Notes (Signed)
[address]       ,[place] [address]             [phone]       HPI: Julie Gill returns today for scheduled postop follow-up visit.  She is a 75 year old woman with a remote history of light of tobacco use and a past medical history of atrial fibrillation, arthritis, gastroesophageal reflux, anxiety, diastolic dysfunction, hyperlipidemia, hypertension, and obesity. She has CT as part of her workup for kidney stones and was found to have a groundglass opacity at the base of right lower lobe. A chest CT was done which showed multiple groundglass opacities. The dominant one was 2.8 cm and apex of the right lung.  I did a right VATS and wedge resection on 10/02/2016. She had some atrial fibrillation postop the converted back into sinus prior to discharge. She's been on chronic anticoagulation.  She saw Dr. Julien Nordmann last week. He plans observation.  She continues to have incisional pain and some referred pain into her right breast. She's not able to wear a bra. She's taking Ultram 50 mg about 4 times a day. She says the pain is getting better. She has not had any problems with her breathing.  Past Medical History:  Diagnosis Date  . Anxiety   . Arthritis   . Asthma    dx 10 yrs ago  . Atrial fibrillation (HCC)    CHADS VASC score 3. Intolerant to flecainide. s/p afib ablation 02/12/12  . Colon polyps   . Diastolic dysfunction   . Dysrhythmia    a fib   dx 2014  . GERD (gastroesophageal reflux disease)   . History of kidney infection     last one was approx 2009  . Hyperlipidemia   . Hypertension   . Mild sleep apnea    wears NO equipment  . Mitral regurgitation    mild  . Neuromuscular disorder ([place])   . Obesity   . Plantar fasciitis   . Restless leg syndrome   . Sinus bradycardia   . Sleep apnea   . Stress bladder incontinence, female    WEARS SMALL PADS      Current Outpatient Prescriptions  Medication Sig Dispense Refill  . calcium  carbonate (OS-CAL) 600 MG TABS Take 600 mg by mouth daily.     . Cholecalciferol (VITAMIN D PO) Take 4,000 Units by mouth daily.      . clonazePAM (KLONOPIN) 0.5 MG tablet Take 0.25 mg by mouth 2 (two) times daily as needed for anxiety.     . fish oil-omega-3 fatty acids 1000 MG capsule Take 1 g by mouth 2 (two) times daily.     . hydrochlorothiazide (HYDRODIURIL) 25 MG tablet TAKE 1/2 TABLET EVERY DAY (NEED AN APPT WITH LORI FXTKWIOX) (Patient taking differently: TAKE 12.5 MG BY MOUTH EVERY DAY (NEED AN APPT WITH LORI GERHARDT)) 45 tablet 3  . lisinopril (PRINIVIL,ZESTRIL) 40 MG tablet Take 40 mg by mouth daily.    Marland Kitchen lovastatin (MEVACOR) 40 MG tablet Take 80 mg by mouth daily.     Marland Kitchen MAGNESIUM PO Take 250 mg by mouth daily.    . Multiple Vitamins-Minerals (MULTIVITAMIN WITH MINERALS) tablet Take 1 tablet by mouth daily.      . nitroGLYCERIN (NITROSTAT) 0.4 MG SL tablet Place 1 tablet (0.4 mg total) under the tongue every 5 (five) minutes as needed for chest pain. 25 tablet 3  . pantoprazole (PROTONIX) 40 MG tablet Take 1  tablet (40 mg total) by mouth daily. (Patient taking differently: Take 40 mg by mouth every evening. ) 30 tablet 11  . potassium chloride SA (K-DUR,KLOR-CON) 20 MEQ tablet Take 20 mEq by mouth daily.    Marland Kitchen senna (SENOKOT) 8.6 MG tablet Take 1 tablet by mouth daily.     . traMADol (ULTRAM) 50 MG tablet Take 1 tablet (50 mg total) by mouth every 6 (six) hours as needed for moderate pain (mild pain). 30 tablet 0  . VENTOLIN HFA 108 (90 Base) MCG/ACT inhaler Inhale 2 puffs into the lungs every 6 (six) hours as needed for wheezing or shortness of breath.     . vitamin E (VITAMIN E) 400 UNIT capsule Take 400 Units by mouth daily.     Alveda Reasons 20 MG TABS tablet Take 20 mg by mouth daily with supper.      No current facility-administered medications for this visit.     Physical Exam BP 138/70 (BP Location: Right Arm, Patient Position: Sitting, Cuff Size: Large)   Pulse 84   Resp 16    Ht '5\' 3"'$  (1.6 m)   Wt 195 lb (88.5 kg)   SpO2 94% Comment: RA  BMI 34.47 kg/m  75 year old woman in no acute distress Alert and oriented 3 with no focal deficits Has full range of motion and right shoulder Incisions clean dry and intact Lungs clear with breath sounds bilaterally  Diagnostic Tests: I reviewed her chest x-ray. It shows no active disease.  Impression: 75 year old woman with a stage IA well-differentiated adenocarcinoma. She is now about a month out from surgery. She does still have some incisional pain, which is not surprising. That she continue to improve over time. She's currently well managed and internal see any indication for additional medication this point. She does require additional medication something on the lines of Lyrica or gabapentin might be better than a stronger narcotic.  She should not attempt to drive until her pain improves. Otherwise, there are no restrictions on her activities, but she was advised to build into new activities gradually.   Dr. Julien Nordmann plans to see her back in 6 months with a CT of the chest.  Plan: I will see her back in 2 months with a chest x-ray to check on her progress.  Julie Nakayama, MD [facility] [phone]

## 2016-11-11 NOTE — Progress Notes (Signed)
CARDIOLOGY OFFICE NOTE  Date:  11/12/2016    Julie Gill Date of Birth: [birth year] Medical Record #[id number]  PCP:  [name], MD  Cardiologist:  [facility]    Chief Complaint  Patient presents with  . Atrial Fibrillation    6 month check - seen for Dr. Rayann Heman    History of Present Illness: Julie Gill is a 75 y.o. female who presents today for a follow up visit. Seen for Dr. Rayann Heman.   She has no known CAD. Does have PAF with prior ablation. Committed to long term anticoagulation - previously on Coumadin - now on Xarelto. Other issues include GERD, HLD, HTN, mild sleep apnea, bradycardia, restless legs, anxiety and obesity. She had a negative Myoview last December 2012. Last echo in 2013. Negative GXT in 2015. Labs are checked by primary care.   Last seen by Dr. Rayann Heman back in July of 2017 - was doing well.  Echo was updated.   Since last visit - got diagnosed with lung cancer - found incidentally on a scan for her kidney due to possible stone. She has had R VATS by Dr. Roxan Hockey in early November. Her beta blocker was stopped during that admission due to symptomatic bradycardia. Now on Xarelto in place of Coumadin.   Comes back today. Here alone. Doing ok. Little depressed with recent events. Trying to be more active. No palpitations. No longer on her Metoprolol. Not dizzy or lightheaded. BP looks good. Lost a few pounds. Now followed by oncology. Currently treatment is just evaluation. Apparently her cancer is very slow growing. She admits she has been depressed - was given some type of antidepressant - sounds like it made her jittery - she has stopped.   Past Medical History:  Diagnosis Date  . Anxiety   . Arthritis   . Asthma    dx 10 yrs ago  . Atrial fibrillation (HCC)    CHADS VASC score 3. Intolerant to flecainide. s/p afib ablation 02/12/12  . Colon polyps   . Diastolic dysfunction   . Dysrhythmia    a fib   dx 2014  . GERD  (gastroesophageal reflux disease)   . History of kidney infection     last one was approx 2009  . Hyperlipidemia   . Hypertension   . Mild sleep apnea    wears NO equipment  . Mitral regurgitation    mild  . Neuromuscular disorder ([place])   . Obesity   . Plantar fasciitis   . Restless leg syndrome   . Sinus bradycardia   . Sleep apnea   . Stress bladder incontinence, female    WEARS SMALL PADS    Past Surgical History:  Procedure Laterality Date  . ABDOMINAL HYSTERECTOMY  1992   BSO  . APPENDECTOMY    . atrial fibrillation ablation  02/12/12   PVI by Dr Rayann Heman  . ATRIAL FIBRILLATION ABLATION N/A 02/12/2012   Procedure: ATRIAL FIBRILLATION ABLATION;  Surgeon: Thompson Grayer, MD;  Location: [facility] CATH LAB;  Service: Cardiovascular;  Laterality: N/A;  . BLADDER SUSPENSION    . [place], 200,2004, 2007, 05/06/2011   adenomas  . heels spurs     x 2  . TEE WITHOUT CARDIOVERSION  02/11/2012   Procedure: TRANSESOPHAGEAL ECHOCARDIOGRAM (TEE);  Surgeon: Larey Dresser, MD;  Location: [facility];  Service: Cardiovascular;  Laterality: N/A;  . TONSILLECTOMY AND ADENOIDECTOMY    . VARICOSE VEIN SURGERY    . VIDEO  ASSISTED THORACOSCOPY (VATS)/WEDGE RESECTION Right 10/02/2016   Procedure: VIDEO ASSISTED THORACOSCOPY (VATS)/WEDGE RESECTION;  Surgeon: [name], MD;  Location: [facility];  Service: Thoracic;  Laterality: Right;     Medications: Current Outpatient Prescriptions  Medication Sig Dispense Refill  . calcium carbonate (OS-CAL) 600 MG TABS Take 600 mg by mouth daily.     . Cholecalciferol (VITAMIN D PO) Take 4,000 Units by mouth daily.      . clonazePAM (KLONOPIN) 0.5 MG tablet Take 0.25 mg by mouth 2 (two) times daily as needed for anxiety.     . fish oil-omega-3 fatty acids 1000 MG capsule Take 1 g by mouth 2 (two) times daily.     . hydrochlorothiazide (HYDRODIURIL) 25 MG tablet TAKE 1/2 TABLET EVERY DAY (NEED AN APPT WITH Brennah Quraishi YOVZCHYI) (Patient  taking differently: TAKE 12.5 MG BY MOUTH EVERY DAY (NEED AN APPT WITH Nyashia Raney)) 45 tablet 3  . lisinopril (PRINIVIL,ZESTRIL) 40 MG tablet Take 40 mg by mouth daily.    Marland Kitchen lovastatin (MEVACOR) 40 MG tablet Take 80 mg by mouth daily.     Marland Kitchen MAGNESIUM PO Take 250 mg by mouth daily.    . Multiple Vitamins-Minerals (MULTIVITAMIN WITH MINERALS) tablet Take 1 tablet by mouth daily.      . nitroGLYCERIN (NITROSTAT) 0.4 MG SL tablet Place 1 tablet (0.4 mg total) under the tongue every 5 (five) minutes as needed for chest pain. 25 tablet 3  . potassium chloride SA (K-DUR,KLOR-CON) 20 MEQ tablet Take 20 mEq by mouth daily.    Marland Kitchen senna (SENOKOT) 8.6 MG tablet Take 1 tablet by mouth daily.     . traMADol (ULTRAM) 50 MG tablet Take 1 tablet (50 mg total) by mouth every 6 (six) hours as needed for moderate pain (mild pain). 30 tablet 0  . VENTOLIN HFA 108 (90 Base) MCG/ACT inhaler Inhale 2 puffs into the lungs every 6 (six) hours as needed for wheezing or shortness of breath.     . vitamin E (VITAMIN E) 400 UNIT capsule Take 400 Units by mouth daily.     Alveda Reasons 20 MG TABS tablet Take 20 mg by mouth daily with supper.     . pantoprazole (PROTONIX) 40 MG tablet Take 1 tablet (40 mg total) by mouth daily. (Patient taking differently: Take 40 mg by mouth every evening. ) 30 tablet 11   No current facility-administered medications for this visit.     Allergies: Allergies  Allergen Reactions  . Codeine Other (See Comments)    Skin crawls, jittery, agitated  . Prednisone Other (See Comments)    Tachycardia, light headed, rash    Social History: The patient  reports that she quit smoking about 31 years ago. Her smoking use included Cigarettes. She has never used smokeless tobacco. She reports that she does not drink alcohol or use drugs.   Family History: The patient's family history includes Colon cancer in her maternal grandmother and maternal uncle; Congestive Heart Failure in her mother; Diabetes  in her brother; Hypertension in her brother; Lung cancer in her father.   Review of Systems: Please see the history of present illness.   Otherwise, the review of systems is positive for none.   All other systems are reviewed and negative.   Physical Exam: VS:  BP 130/80   Pulse 76   Ht '5\' 2"'$  (1.575 m)   Wt 194 lb 12.8 oz (88.4 kg)   SpO2 96% Comment: at rest  BMI 35.63 kg/m  .  BMI Body mass index is 35.63 kg/m.  Wt Readings from Last 3 Encounters:  11/12/16 194 lb 12.8 oz (88.4 kg)  11/04/16 195 lb (88.5 kg)  10/27/16 194 lb 4.8 oz (88.1 kg)    General: Pleasant. Well developed, well nourished and in no acute distress.   HEENT: Normal.  Neck: Supple, no JVD, carotid bruits, or masses noted.  Cardiac: Regular rate and rhythm. No murmurs, rubs, or gallops. No edema.  Respiratory:  Lungs are clear to auscultation bilaterally with normal work of breathing.  GI: Soft and nontender.  MS: No deformity or atrophy. Gait and ROM intact.  Skin: Warm and dry. Color is normal.  Neuro:  Strength and sensation are intact and no gross focal deficits noted.  Psych: Alert, appropriate and with normal affect.   LABORATORY DATA:  EKG:  EKG is not ordered today.  Lab Results  Component Value Date   WBC 6.8 10/27/2016   HGB 12.2 10/27/2016   HCT 37.3 10/27/2016   PLT 266 10/27/2016   GLUCOSE 141 (H) 10/27/2016   ALT 30 10/27/2016   AST 28 10/27/2016   NA 140 10/27/2016   K 4.7 10/27/2016   CL 104 10/04/2016   CREATININE 1.2 (H) 10/27/2016   BUN 19.4 10/27/2016   CO2 24 10/27/2016   INR 1.25 09/29/2016    BNP (last 3 results) No results for input(s): BNP in the last 8760 hours.  ProBNP (last 3 results) No results for input(s): PROBNP in the last 8760 hours.   Other Studies Reviewed Today:  Echo Study Conclusions from 05/2016  - Left ventricle: The cavity size was normal. There was moderate   focal basal hypertrophy. Systolic function was normal. The   estimated ejection  fraction was in the range of 55% to 60%. Wall   motion was normal; there were no regional wall motion   abnormalities. The transmitral flow pattern was normal. Left   ventricular diastolic function parameters were normal. - Mitral valve: Calcified annulus. - Right ventricle: The cavity size was mildly dilated. Wall   thickness was normal.  GXT Comments from May 2015: Patient presents today for routine GXT. Has had chest pain - no known CAD. Has HTN, HLD and obesity. She held 2 doses of her beta blocker prior to this study.  Today the patient exercised on the standard Bruce protocol for a total of 6:30 minutes.  Reduced exercise tolerance.  Adequate blood pressure response.  Clinically negative for chest pain. Test was stopped due to fatigue/reported very mild chest pain that resolved quickly in recovery.  EKG negative for ischemia. Noted to have occasional PACs and PVCs. More PVC's noted at peak of exercise.    Assessment/Plan:   1. PAF - intolerant to flecainide and has had prior ablation - did have recurrence with recent lung surgery - not surprising - no longer on beta blocker due to symptomatic bradycardia - currently doing very well with very little recurrence. Remains in sinus by exam today. She will let me know if she starts to have breakthrough or palpitations.   2. HTN - BP ok on her current regimen. She monitors at home.   3. Chronic anticoagulation - now on Xarelto - no problems noted. Needs follow up labs  4. Obesity - not really talked about today in too much depth  5. Recent VATS for lung cancer   6. Depression - discussed at length.     Current medicines are reviewed with the patient today.  The patient  does not have concerns regarding medicines other than what has been noted above.  The following changes have been made:  See above.  Labs/ tests ordered today include:   No orders of the defined types were placed in this encounter.    Disposition:    FU with me in 6 months.    Patient is agreeable to this plan and will call if any problems develop in the interim.   Signed: Burtis Junes, RN, ANP-C 11/12/2016 2:38 PM  [facility] [address] [place], [place]  [address] Phone: [phone] Fax: [phone]

## 2016-11-12 ENCOUNTER — Ambulatory Visit (INDEPENDENT_AMBULATORY_CARE_PROVIDER_SITE_OTHER): Payer: Commercial Managed Care - HMO | Admitting: Nurse Practitioner

## 2016-11-12 ENCOUNTER — Encounter: Payer: Self-pay | Admitting: Nurse Practitioner

## 2016-11-12 VITALS — BP 130/80 | HR 76 | Ht 62.0 in | Wt 194.8 lb

## 2016-11-12 DIAGNOSIS — I480 Paroxysmal atrial fibrillation: Secondary | ICD-10-CM

## 2016-11-12 DIAGNOSIS — I10 Essential (primary) hypertension: Secondary | ICD-10-CM

## 2016-11-12 LAB — CBC
HCT: 37.6 % (ref 35.0–45.0)
Hemoglobin: 12.2 g/dL (ref 11.7–15.5)
MCH: 29.2 pg (ref 27.0–33.0)
MCHC: 32.4 g/dL (ref 32.0–36.0)
MCV: 90 fL (ref 80.0–100.0)
MPV: 10.5 fL (ref 7.5–12.5)
Platelets: 284 10*3/uL (ref 140–400)
RBC: 4.18 MIL/uL (ref 3.80–5.10)
RDW: 13.7 % (ref 11.0–15.0)
WBC: 7.3 10*3/uL (ref 3.8–10.8)

## 2016-11-12 NOTE — Patient Instructions (Addendum)
We will be checking the following labs today - CBC and BMET  Medication Instructions:    Continue with your current medicines.   Let me know if we need to add back the metoprolol for palpitations or recurrent spells of atrial fib    Testing/Procedures To Be Arranged:  N/A  Follow-Up:   See me in about 4 months    Other Special Instructions:   Think about what we talked about today    If you need a refill on your cardiac medications before your next appointment, please call your pharmacy.   Call the [place] office at [phone] if you have any questions, problems or concerns.

## 2016-11-13 LAB — BASIC METABOLIC PANEL
BUN: 15 mg/dL (ref 7–25)
CO2: 30 mmol/L (ref 20–31)
Calcium: 9.6 mg/dL (ref 8.6–10.4)
Chloride: 103 mmol/L (ref 98–110)
Creat: 0.88 mg/dL (ref 0.60–0.93)
Glucose, Bld: 94 mg/dL (ref 65–99)
Potassium: 4.4 mmol/L (ref 3.5–5.3)
Sodium: 141 mmol/L (ref 135–146)

## 2016-12-04 DIAGNOSIS — H25013 Cortical age-related cataract, bilateral: Secondary | ICD-10-CM | POA: Diagnosis not present

## 2016-12-04 DIAGNOSIS — H2513 Age-related nuclear cataract, bilateral: Secondary | ICD-10-CM | POA: Diagnosis not present

## 2016-12-05 DIAGNOSIS — F419 Anxiety disorder, unspecified: Secondary | ICD-10-CM | POA: Diagnosis not present

## 2016-12-05 DIAGNOSIS — G2581 Restless legs syndrome: Secondary | ICD-10-CM | POA: Diagnosis not present

## 2016-12-05 DIAGNOSIS — E782 Mixed hyperlipidemia: Secondary | ICD-10-CM | POA: Diagnosis not present

## 2016-12-05 DIAGNOSIS — C3491 Malignant neoplasm of unspecified part of right bronchus or lung: Secondary | ICD-10-CM | POA: Diagnosis not present

## 2016-12-05 DIAGNOSIS — Z86010 Personal history of colonic polyps: Secondary | ICD-10-CM | POA: Diagnosis not present

## 2016-12-05 DIAGNOSIS — Z7189 Other specified counseling: Secondary | ICD-10-CM | POA: Diagnosis not present

## 2016-12-05 DIAGNOSIS — D508 Other iron deficiency anemias: Secondary | ICD-10-CM | POA: Diagnosis not present

## 2016-12-05 DIAGNOSIS — I10 Essential (primary) hypertension: Secondary | ICD-10-CM | POA: Diagnosis not present

## 2016-12-23 ENCOUNTER — Other Ambulatory Visit: Payer: Self-pay | Admitting: Family Medicine

## 2016-12-23 DIAGNOSIS — Z1231 Encounter for screening mammogram for malignant neoplasm of breast: Secondary | ICD-10-CM

## 2016-12-24 DIAGNOSIS — J329 Chronic sinusitis, unspecified: Secondary | ICD-10-CM | POA: Diagnosis not present

## 2017-01-05 ENCOUNTER — Other Ambulatory Visit: Payer: Self-pay | Admitting: Thoracic Surgery (Cardiothoracic Vascular Surgery)

## 2017-01-05 DIAGNOSIS — C3490 Malignant neoplasm of unspecified part of unspecified bronchus or lung: Secondary | ICD-10-CM

## 2017-01-06 ENCOUNTER — Encounter: Payer: Self-pay | Admitting: Thoracic Surgery (Cardiothoracic Vascular Surgery)

## 2017-01-14 ENCOUNTER — Ambulatory Visit
Admission: RE | Admit: 2017-01-14 | Discharge: 2017-01-14 | Disposition: A | Discharge status: Home / Self Care | Payer: PPO | Source: Ambulatory Visit | Attending: Family Medicine | Admitting: Family Medicine

## 2017-01-14 DIAGNOSIS — Z1231 Encounter for screening mammogram for malignant neoplasm of breast: Secondary | ICD-10-CM | POA: Diagnosis not present

## 2017-01-20 ENCOUNTER — Encounter: Payer: Self-pay | Admitting: Thoracic Surgery (Cardiothoracic Vascular Surgery)

## 2017-01-27 ENCOUNTER — Ambulatory Visit
Admission: RE | Admit: 2017-01-27 | Discharge: 2017-01-27 | Disposition: A | Discharge status: Home / Self Care | Payer: PPO | Source: Ambulatory Visit | Attending: Thoracic Surgery (Cardiothoracic Vascular Surgery) | Admitting: Thoracic Surgery (Cardiothoracic Vascular Surgery)

## 2017-01-27 ENCOUNTER — Encounter: Payer: Self-pay | Admitting: Thoracic Surgery (Cardiothoracic Vascular Surgery)

## 2017-01-27 ENCOUNTER — Ambulatory Visit (INDEPENDENT_AMBULATORY_CARE_PROVIDER_SITE_OTHER): Payer: PPO | Admitting: Thoracic Surgery (Cardiothoracic Vascular Surgery)

## 2017-01-27 VITALS — BP 135/61 | HR 77 | Resp 16 | Ht 62.0 in | Wt 185.0 lb

## 2017-01-27 DIAGNOSIS — Z09 Encounter for follow-up examination after completed treatment for conditions other than malignant neoplasm: Secondary | ICD-10-CM

## 2017-01-27 DIAGNOSIS — C3490 Malignant neoplasm of unspecified part of unspecified bronchus or lung: Secondary | ICD-10-CM

## 2017-01-27 DIAGNOSIS — C73 Malignant neoplasm of thyroid gland: Secondary | ICD-10-CM | POA: Diagnosis not present

## 2017-01-27 NOTE — Progress Notes (Signed)
[address]       [place] [address]             [phone]    HPI: Julie Gill returns today for a scheduled follow-up visit  She is a 76 year old woman with a remote history of light tobacco abuse who was being evaluated for kidney stones. A CT scan showed a groundglass opacity at the base the right lower lobe. CT of the chest showed multiple groundglass opacities, the dominant one being a 2.8 cm opacity in the apex of the right lung. I did a wedge resection on 10/02/2016. Her postop course was remarkable for some atrial fibrillation which converted back to sinus prior to discharge.  Because of technical issues intraoperatively no lymph node dissection was done.  She saw Dr. Julien Nordmann in November. He did not recommend any adjuvant treatment.  She has some occasional discomfort related to her incision but does not take anything for it. She has not had any problems with her breathing. She continues to be very anxious about the remaining small groundglass opacities in her lungs.  Past Medical History:  Diagnosis Date  . Anxiety   . Arthritis   . Asthma    dx 10 yrs ago  . Atrial fibrillation (HCC)    CHADS VASC score 3. Intolerant to flecainide. s/p afib ablation 02/12/12  . Colon polyps   . Diastolic dysfunction   . Dysrhythmia    a fib   dx 2014  . GERD (gastroesophageal reflux disease)   . History of kidney infection     last one was approx 2009  . Hyperlipidemia   . Hypertension   . Mild sleep apnea    wears NO equipment  . Mitral regurgitation    mild  . Neuromuscular disorder ([place])   . Obesity   . Plantar fasciitis   . Restless leg syndrome   . Sinus bradycardia   . Sleep apnea   . Stress bladder incontinence, female    WEARS SMALL PADS     Current Outpatient Prescriptions  Medication Sig Dispense Refill  . calcium carbonate (OS-CAL) 600 MG TABS Take 600 mg by mouth daily.     . Cholecalciferol (VITAMIN D PO) Take 4,000 Units by mouth  daily.      . clonazePAM (KLONOPIN) 0.5 MG tablet Take 0.25 mg by mouth 2 (two) times daily as needed for anxiety.     . fish oil-omega-3 fatty acids 1000 MG capsule Take 1 g by mouth 2 (two) times daily.     . hydrochlorothiazide (HYDRODIURIL) 25 MG tablet TAKE 1/2 TABLET EVERY DAY (NEED AN APPT WITH LORI OYDXAJOI) (Patient taking differently: TAKE 12.5 MG BY MOUTH EVERY DAY (NEED AN APPT WITH LORI GERHARDT)) 45 tablet 3  . lisinopril (PRINIVIL,ZESTRIL) 40 MG tablet Take 40 mg by mouth daily.    Marland Kitchen lovastatin (MEVACOR) 40 MG tablet Take 80 mg by mouth daily.     Marland Kitchen MAGNESIUM PO Take 250 mg by mouth daily.    . Multiple Vitamins-Minerals (MULTIVITAMIN WITH MINERALS) tablet Take 1 tablet by mouth daily.      . nitroGLYCERIN (NITROSTAT) 0.4 MG SL tablet Place 1 tablet (0.4 mg total) under the tongue every 5 (five) minutes as needed for chest pain. 25 tablet 3  . pantoprazole (PROTONIX) 40 MG tablet Take 1 tablet (40 mg total) by mouth daily. (Patient taking differently: Take 40 mg by mouth every evening. ) 30 tablet 11  . potassium  chloride SA (K-DUR,KLOR-CON) 20 MEQ tablet Take 20 mEq by mouth daily.    Marland Kitchen senna (SENOKOT) 8.6 MG tablet Take 1 tablet by mouth daily.     . VENTOLIN HFA 108 (90 Base) MCG/ACT inhaler Inhale 2 puffs into the lungs every 6 (six) hours as needed for wheezing or shortness of breath.     . vitamin E (VITAMIN E) 400 UNIT capsule Take 400 Units by mouth daily.     Alveda Reasons 20 MG TABS tablet Take 20 mg by mouth daily with supper.      No current facility-administered medications for this visit.     Physical Exam BP 135/61 (BP Location: Right Arm, Patient Position: Sitting, Cuff Size: Large)   Pulse 77   Resp 16   Ht '5\' 2"'$  (1.575 m)   Wt 185 lb (83.9 kg)   SpO2 95% Comment: ON RA  BMI 33.84 kg/m  Obese 76 year old woman in no acute distress Alert and oriented 3 with no focal deficits Incisions well healed Cardiac regular rate and rhythm normal S1 and S2 Lungs clear  with equal breath sounds bilaterally  Diagnostic Tests: CHEST  2 VIEW  COMPARISON:  11/04/2016  FINDINGS: The heart size and mediastinal contours are within normal limits. Both lungs are clear. The visualized skeletal structures are unremarkable.  IMPRESSION: No active cardiopulmonary disease.   Electronically Signed   By: Kerby Moors M.D.   On: 01/27/2017 14:12 I personally reviewed the chest x-ray images and concur with the findings noted above.  Impression: 76 year old woman who had a wedge resection for a groundglass opacity it turned out to be a well-differentiated adenocarcinoma back in November. She has recovered from the surgery without incident.  She has multiple small GGOs bilaterally on CT and remains very anxious about them. I tried to reassure her that even if these are low-grade tumors at any change is noted they can be treated early.  She has a follow-up appointment with Dr. Julien Nordmann in May and will have a CT at that time.  Plan: Follow-up is scheduled with Dr. Julien Nordmann  I will be happy to see Mrs. Kerekes back at any time if I can be of any further assistance with her care  [name], MD [facility] [phone]

## 2017-02-02 DIAGNOSIS — I10 Essential (primary) hypertension: Secondary | ICD-10-CM | POA: Diagnosis not present

## 2017-02-02 DIAGNOSIS — Z0000 Encounter for general adult medical examination without abnormal findings: Secondary | ICD-10-CM | POA: Diagnosis not present

## 2017-02-02 DIAGNOSIS — E782 Mixed hyperlipidemia: Secondary | ICD-10-CM | POA: Diagnosis not present

## 2017-02-02 DIAGNOSIS — Z01419 Encounter for gynecological examination (general) (routine) without abnormal findings: Secondary | ICD-10-CM | POA: Diagnosis not present

## 2017-02-02 DIAGNOSIS — C3491 Malignant neoplasm of unspecified part of right bronchus or lung: Secondary | ICD-10-CM | POA: Diagnosis not present

## 2017-02-02 DIAGNOSIS — F324 Major depressive disorder, single episode, in partial remission: Secondary | ICD-10-CM | POA: Diagnosis not present

## 2017-02-02 DIAGNOSIS — I480 Paroxysmal atrial fibrillation: Secondary | ICD-10-CM | POA: Diagnosis not present

## 2017-02-02 DIAGNOSIS — K219 Gastro-esophageal reflux disease without esophagitis: Secondary | ICD-10-CM | POA: Diagnosis not present

## 2017-02-02 DIAGNOSIS — G2581 Restless legs syndrome: Secondary | ICD-10-CM | POA: Diagnosis not present

## 2017-02-02 DIAGNOSIS — R7303 Prediabetes: Secondary | ICD-10-CM | POA: Diagnosis not present

## 2017-02-02 DIAGNOSIS — Z79899 Other long term (current) drug therapy: Secondary | ICD-10-CM | POA: Diagnosis not present

## 2017-02-02 DIAGNOSIS — E669 Obesity, unspecified: Secondary | ICD-10-CM | POA: Diagnosis not present

## 2017-02-05 DIAGNOSIS — R05 Cough: Secondary | ICD-10-CM | POA: Diagnosis not present

## 2017-02-07 ENCOUNTER — Emergency Department (HOSPITAL_COMMUNITY)
Admission: EM | Admit: 2017-02-07 | Discharge: 2017-02-07 | Disposition: A | Discharge status: Home / Self Care | Payer: PPO | Attending: Emergency Medicine | Admitting: Emergency Medicine

## 2017-02-07 ENCOUNTER — Emergency Department (HOSPITAL_COMMUNITY): Payer: PPO

## 2017-02-07 ENCOUNTER — Encounter (HOSPITAL_COMMUNITY): Payer: Self-pay | Admitting: Nurse Practitioner

## 2017-02-07 DIAGNOSIS — R200 Anesthesia of skin: Secondary | ICD-10-CM | POA: Diagnosis not present

## 2017-02-07 DIAGNOSIS — Z79899 Other long term (current) drug therapy: Secondary | ICD-10-CM | POA: Diagnosis not present

## 2017-02-07 DIAGNOSIS — Z87891 Personal history of nicotine dependence: Secondary | ICD-10-CM | POA: Insufficient documentation

## 2017-02-07 DIAGNOSIS — I10 Essential (primary) hypertension: Secondary | ICD-10-CM | POA: Diagnosis not present

## 2017-02-07 DIAGNOSIS — R202 Paresthesia of skin: Secondary | ICD-10-CM | POA: Diagnosis not present

## 2017-02-07 DIAGNOSIS — R531 Weakness: Secondary | ICD-10-CM | POA: Diagnosis not present

## 2017-02-07 DIAGNOSIS — J45909 Unspecified asthma, uncomplicated: Secondary | ICD-10-CM | POA: Insufficient documentation

## 2017-02-07 DIAGNOSIS — N289 Disorder of kidney and ureter, unspecified: Secondary | ICD-10-CM | POA: Diagnosis not present

## 2017-02-07 LAB — CBC
HCT: 39.3 % (ref 36.0–46.0)
Hemoglobin: 12.4 g/dL (ref 12.0–15.0)
MCH: 28.8 pg (ref 26.0–34.0)
MCHC: 31.6 g/dL (ref 30.0–36.0)
MCV: 91.4 fL (ref 78.0–100.0)
Platelets: 205 10*3/uL (ref 150–400)
RBC: 4.3 MIL/uL (ref 3.87–5.11)
RDW: 13.5 % (ref 11.5–15.5)
WBC: 5.9 10*3/uL (ref 4.0–10.5)

## 2017-02-07 LAB — BASIC METABOLIC PANEL
Anion gap: 11 (ref 5–15)
BUN: 19 mg/dL (ref 6–20)
CO2: 23 mmol/L (ref 22–32)
Calcium: 9 mg/dL (ref 8.9–10.3)
Chloride: 101 mmol/L (ref 101–111)
Creatinine, Ser: 1.34 mg/dL — ABNORMAL HIGH (ref 0.44–1.00)
GFR calc Af Amer: 44 mL/min — ABNORMAL LOW (ref >60)
GFR calc non Af Amer: 38 mL/min — ABNORMAL LOW (ref >60)
Glucose, Bld: 155 mg/dL — ABNORMAL HIGH (ref 65–99)
Potassium: 3.9 mmol/L (ref 3.5–5.1)
Sodium: 135 mmol/L (ref 135–145)

## 2017-02-07 LAB — TROPONIN I: Troponin I: <0.03 ng/mL (ref <0.03)

## 2017-02-07 MED ORDER — LORAZEPAM 2 MG/ML IJ SOLN
0.5000 mg | Freq: Once | INTRAMUSCULAR | Status: AC
  Administered 2017-02-07: 0.5 mg via INTRAVENOUS
  Filled 2017-02-07: qty 1

## 2017-02-07 MED ORDER — ACETAMINOPHEN 500 MG PO TABS
1000.0000 mg | ORAL_TABLET | Freq: Once | ORAL | Status: AC
  Administered 2017-02-07: 1000 mg via ORAL
  Filled 2017-02-07: qty 2

## 2017-02-07 MED ORDER — GADOBENATE DIMEGLUMINE 529 MG/ML IV SOLN
15.0000 mL | Freq: Once | INTRAVENOUS | Status: AC
  Administered 2017-02-07: 12 mL via INTRAVENOUS

## 2017-02-07 NOTE — Discharge Instructions (Signed)
Take Tylenol as directed for pain. Call Dr. Adah Salvage office in 2 days to schedule an office visit. Tell office staff that you had an MRI scan of your spine performed here. Call Dr.McNeill in 2 days to schedule office visit. Your kidney function is not quite normal. Blood creatinine was 1.34. Dr. Addison Lank should recheck your blood creatinine within the next one or 2 weeks. Turn if you feel worse for any reason.

## 2017-02-07 NOTE — ED Provider Notes (Signed)
[facility] DEPT Provider Note   CSN: [id number] Arrival date & time: 02/07/17  1123     History   Chief Complaint Chief Complaint  Patient presents with  . Numbness    HPI Julie Gill is a 76 y.o. female.  HPI patient developed numbness over her entire body, both arms both legs and trunk approximate 3 days ago, and she feels weak in both arms and both legs she also developed posterior neck pain 3 days ago which is constant. Numbness has resolved without treatment. She continues to complain of weakness and neck pain. Other associated symptoms include "flulike symptoms" to include cough and diffuse myalgias with temperature 100.9 3 days ago. She saw an urgent care physician who treated her with Tamiflu and another medication which she does not recall. Pharmacy technician believes the medication to be Levaquin She does report that she had a chest x-ray which showed no evidence of pneumonia.  Past Medical History:  Diagnosis Date  . Anxiety   . Arthritis   . Asthma    dx 10 yrs ago  . Atrial fibrillation (HCC)    CHADS VASC score 3. Intolerant to flecainide. s/p afib ablation 02/12/12  . Colon polyps   . Diastolic dysfunction   . Dysrhythmia    a fib   dx 2014  . GERD (gastroesophageal reflux disease)   . History of kidney infection     last one was approx 2009  . Hyperlipidemia   . Hypertension   . Mild sleep apnea    wears NO equipment  . Mitral regurgitation    mild  . Neuromuscular disorder ([place])   . Obesity   . Plantar fasciitis   . Restless leg syndrome   . Sinus bradycardia   . Sleep apnea   . Stress bladder incontinence, female    WEARS SMALL PADS    Patient Active Problem List   Diagnosis Date Noted  . Adenocarcinoma of right lung, stage 1 ([place]) 10/27/2016  . Adenocarcinoma of lung ([place]) 10/02/2016  . Edema 04/02/2016  . Headache(784.0) 01/13/2012  . Essential hypertension 01/06/2012  . Long term current use of anticoagulant 11/19/2011  . Dyspnea  on exertion 11/13/2011  . Sinus bradycardia   . Paroxysmal atrial fibrillation (HCC)   . History of colonic polyps 05/06/2011    Past Surgical History:  Procedure Laterality Date  . ABDOMINAL HYSTERECTOMY  1992   BSO  . APPENDECTOMY    . atrial fibrillation ablation  02/12/12   PVI by Dr Rayann Heman  . ATRIAL FIBRILLATION ABLATION N/A 02/12/2012   Procedure: ATRIAL FIBRILLATION ABLATION;  Surgeon: Thompson Grayer, MD;  Location: [facility] CATH LAB;  Service: Cardiovascular;  Laterality: N/A;  . BLADDER SUSPENSION    . [place], 200,2004, 2007, 05/06/2011   adenomas  . heels spurs     x 2  . TEE WITHOUT CARDIOVERSION  02/11/2012   Procedure: TRANSESOPHAGEAL ECHOCARDIOGRAM (TEE);  Surgeon: Larey Dresser, MD;  Location: [facility];  Service: Cardiovascular;  Laterality: N/A;  . TONSILLECTOMY AND ADENOIDECTOMY    . VARICOSE VEIN SURGERY    . VIDEO ASSISTED THORACOSCOPY (VATS)/WEDGE RESECTION Right 10/02/2016   Procedure: VIDEO ASSISTED THORACOSCOPY (VATS)/WEDGE RESECTION;  Surgeon: [name], MD;  Location: [facility];  Service: Thoracic;  Laterality: Right;    OB History    No data available       Home Medications    Prior to Admission medications   Medication Sig Start Date End Date  Taking? Authorizing Provider  calcium carbonate (OS-CAL) 600 MG TABS Take 600 mg by mouth daily.     Historical Provider, MD  Cholecalciferol (VITAMIN D PO) Take 4,000 Units by mouth daily.      Historical Provider, MD  clonazePAM (KLONOPIN) 0.5 MG tablet Take 0.25 mg by mouth 2 (two) times daily as needed for anxiety.     Historical Provider, MD  fish oil-omega-3 fatty acids 1000 MG capsule Take 1 g by mouth 2 (two) times daily.  11/20/11   Deboraha Sprang, MD  hydrochlorothiazide (HYDRODIURIL) 25 MG tablet TAKE 1/2 TABLET EVERY DAY (NEED AN APPT WITH LORI [id number]) Patient taking differently: TAKE 12.5 MG BY MOUTH EVERY DAY (NEED AN APPT WITH LORI GERHARDT) 04/11/16   Thompson Grayer, MD   lisinopril (PRINIVIL,ZESTRIL) 40 MG tablet Take 40 mg by mouth daily.    Historical Provider, MD  lovastatin (MEVACOR) 40 MG tablet Take 80 mg by mouth daily.  09/11/12   Historical Provider, MD  MAGNESIUM PO Take 250 mg by mouth daily.    Historical Provider, MD  Multiple Vitamins-Minerals (MULTIVITAMIN WITH MINERALS) tablet Take 1 tablet by mouth daily.      Historical Provider, MD  nitroGLYCERIN (NITROSTAT) 0.4 MG SL tablet Place 1 tablet (0.4 mg total) under the tongue every 5 (five) minutes as needed for chest pain. 10/11/12   Burtis Junes, NP  pantoprazole (PROTONIX) 40 MG tablet Take 1 tablet (40 mg total) by mouth daily. Patient taking differently: Take 40 mg by mouth every evening.  03/23/12 01/27/17  Thompson Grayer, MD  potassium chloride SA (K-DUR,KLOR-CON) 20 MEQ tablet Take 20 mEq by mouth daily.    Historical Provider, MD  senna (SENOKOT) 8.6 MG tablet Take 1 tablet by mouth daily.     Historical Provider, MD  VENTOLIN HFA 108 (90 Base) MCG/ACT inhaler Inhale 2 puffs into the lungs every 6 (six) hours as needed for wheezing or shortness of breath.  05/28/16   Historical Provider, MD  vitamin E (VITAMIN E) 400 UNIT capsule Take 400 Units by mouth daily.     Historical Provider, MD  XARELTO 20 MG TABS tablet Take 20 mg by mouth daily with supper.  04/11/16   Historical Provider, MD    Family History Family History  Problem Relation Age of Onset  . Colon cancer Maternal Uncle   . Colon cancer Maternal Grandmother   . Congestive Heart Failure Mother   . Lung cancer Father   . Diabetes Brother   . Hypertension Brother     Social History Social History  Substance Use Topics  . Smoking status: Former Smoker    Types: Cigarettes    Quit date: 09/17/1985  . Smokeless tobacco: Never Used     Comment: 5 CIG /DAY  . Alcohol use No     Allergies   Codeine and Prednisone   Review of Systems Review of Systems  Constitutional: Positive for fever.  HENT: Negative.     Respiratory: Positive for cough.   Cardiovascular: Negative.   Gastrointestinal: Negative.   Musculoskeletal: Negative.   Skin: Negative.   Neurological: Positive for weakness and numbness.       Generalized weakness  Psychiatric/Behavioral: Negative.   All other systems reviewed and are negative.    Physical Exam Updated Vital Signs BP (!) 106/50   Pulse 71   Temp 97.9 F (36.6 C) (Oral)   Resp 13   SpO2 94%   Physical Exam  Constitutional: She appears well-developed and  well-nourished.  HENT:  Head: Normocephalic and atraumatic.  Eyes: Conjunctivae are normal. Pupils are equal, round, and reactive to light.  Neck: Neck supple. No tracheal deviation present. No thyromegaly present.  Cardiovascular: Normal rate and regular rhythm.   No murmur heard. Pulmonary/Chest: Effort normal and breath sounds normal.  Abdominal: Soft. Bowel sounds are normal. She exhibits no distension. There is no tenderness.  Obese  Musculoskeletal: Normal range of motion. She exhibits no edema or tenderness.  Tire spine is nontender. All 4 extremity is without redness or tenderness neurovascular intact  Neurological: She is alert. Coordination normal.  Strength 5 over 5 overall gait normal Romberg normal pronator drift normal DTR symmetric bilaterally at knee jerk ankle jerk and biceps toes downward going bilaterally  Skin: Skin is warm and dry. No rash noted.  Psychiatric: She has a normal mood and affect.  Nursing note and vitals reviewed.  Results for orders placed or performed during the hospital encounter of 16/10/96  Basic metabolic panel  Result Value Ref Range   Sodium 135 135 - 145 mmol/L   Potassium 3.9 3.5 - 5.1 mmol/L   Chloride 101 101 - 111 mmol/L   CO2 23 22 - 32 mmol/L   Glucose, Bld 155 (H) 65 - 99 mg/dL   BUN 19 6 - 20 mg/dL   Creatinine, Ser 1.34 (H) 0.44 - 1.00 mg/dL   Calcium 9.0 8.9 - 10.3 mg/dL   GFR calc non Af Amer 38 (L) >60 mL/min   GFR calc Af Amer 44 (L) >60  mL/min   Anion gap 11 5 - 15  CBC  Result Value Ref Range   WBC 5.9 4.0 - 10.5 K/uL   RBC 4.30 3.87 - 5.11 MIL/uL   Hemoglobin 12.4 12.0 - 15.0 g/dL   HCT 39.3 36.0 - 46.0 %   MCV 91.4 78.0 - 100.0 fL   MCH 28.8 26.0 - 34.0 pg   MCHC 31.6 30.0 - 36.0 g/dL   RDW 13.5 11.5 - 15.5 %   Platelets 205 150 - 400 K/uL  Troponin I  Result Value Ref Range   Troponin I <0.03 <0.03 ng/mL   Dg Chest 2 View  Result Date: 01/27/2017 CLINICAL DATA:  Malignant neoplasm of the lung. EXAM: CHEST  2 VIEW COMPARISON:  11/04/2016 FINDINGS: The heart size and mediastinal contours are within normal limits. Both lungs are clear. The visualized skeletal structures are unremarkable. IMPRESSION: No active cardiopulmonary disease. Electronically Signed   By: Kerby Moors M.D.   On: 01/27/2017 14:12   Mr Cervical Spine W Wo Contrast  Result Date: 02/07/2017 CLINICAL DATA:  76 year old female with numbness and tingling in the left arm. Bilateral upper and lower extremity paresthesias for 2 months, worse today. EXAM: MRI CERVICAL SPINE WITHOUT AND WITH CONTRAST TECHNIQUE: Multiplanar and multiecho pulse sequences of the cervical spine, to include the craniocervical junction and cervicothoracic junction, were obtained without and with intravenous contrast. CONTRAST:  78m MULTIHANCE GADOBENATE DIMEGLUMINE 529 MG/ML IV SOLN COMPARISON:  None. FINDINGS: Alignment: Straightening of cervical lordosis. Vertebrae: No marrow edema or evidence of acute osseous abnormality. Visualized bone marrow signal is within normal limits. Cord: Despite multilevel cervical spinal stenosis no spinal cord signal abnormality is identified. No abnormal intradural enhancement. Posterior Fossa, vertebral arteries, paraspinal tissues: Cervicomedullary junction is within normal limits. Visible brain parenchyma is normal for age. Preserved major vascular flow voids in the neck. Negative neck soft tissues. Disc levels: C2-C3: Mild uncovertebral  hypertrophy. Mild ligament flavum hypertrophy. No  significant stenosis. C3-C4: Circumferential disc bulge with broad-based posterior component and bulky anterior disc osteophyte complex. Ligament flavum hypertrophy. Spinal stenosis with mild spinal cord flattening (series 5, image 13). Moderate left and moderate to severe right C4 foraminal stenosis. C4-C5: Circumferential disc bulge with broad-based posterior component and bulky anterior disc osteophyte complex. Mild ligament flavum hypertrophy. Spinal stenosis with mild if any spinal cord mass effect. Uncovertebral hypertrophy. Facet hypertrophy greater on the right. Moderate to severe bilateral C5 foraminal stenosis. C5-C6: Circumferential disc bulge with broad-based posterior component and bulky anterior disc osteophyte complex. Mild ligament flavum hypertrophy. Borderline to mild spinal stenosis. Uncovertebral hypertrophy. Moderate bilateral C6 foraminal stenosis. C6-C7: Circumferential disc bulge with left eccentric broad-based posterior component. Mild ligament flavum hypertrophy. No significant spinal stenosis. Mild uncovertebral hypertrophy. Mild left C7 foraminal stenosis. C7-T1: Moderate bilateral facet hypertrophy. Mild uncovertebral hypertrophy. Moderate left and mild right C8 foraminal stenosis. No upper thoracic spinal stenosis. Mild upper thoracic facet hypertrophy and left T1 foraminal stenosis. IMPRESSION: 1. Chronic cervical disc and endplate degeneration with probable diffuse idiopathic skeletal hyperostosis. 2. Degenerative spinal stenosis C3-C4 through C5-C6 with up to mild spinal cord mass effect. No cord signal abnormality identified. 3. Moderate or severe neural foraminal stenosis at the right greater than left C4, bilateral C5, bilateral C6, and left C8 nerve levels. Electronically Signed   By: Genevie Ann M.D.   On: 02/07/2017 15:03   Mm Screening Breast Tomo Bilateral  Result Date: 01/14/2017 CLINICAL DATA:  Screening. EXAM: 2D DIGITAL  SCREENING BILATERAL MAMMOGRAM WITH CAD AND ADJUNCT TOMO COMPARISON:  Previous exam(s). ACR Breast Density Category b: There are scattered areas of fibroglandular density. FINDINGS: There are no findings suspicious for malignancy. Images were processed with CAD. IMPRESSION: No mammographic evidence of malignancy. A result letter of this screening mammogram will be mailed directly to the patient. RECOMMENDATION: Screening mammogram in one year. (Code:SM-B-01Y) BI-RADS CATEGORY  1: Negative. Electronically Signed   By: Lillia Mountain M.D.   On: 01/14/2017 16:38    ED Treatments / Results  Labs (all labs ordered are listed, but only abnormal results are displayed) Labs Reviewed  BASIC METABOLIC PANEL  CBC  TROPONIN I    EKG  EKG Interpretation  Date/Time:  Saturday February 07 2017 11:38:37 EST Ventricular Rate:  79 PR Interval:  138 QRS Duration: 84 QT Interval:  398 QTC Calculation: 456 R Axis:   60 Text Interpretation:  Normal sinus rhythm Normal ECG Since last tracing rate slower Confirmed by Winfred Leeds  MD, Terin Dierolf [phone]) on 02/07/2017 12:13:16 PM       Radiology No results found.  Procedures Procedures (including critical care time)  Medications Ordered in ED Medications  LORazepam (ATIVAN) injection 0.5 mg (not administered)  acetaminophen (TYLENOL) tablet 1,000 mg (not administered)     Initial Impression / Assessment and Plan / ED Course  I have reviewed the triage vital signs and the nursing notes.  Pertinent labs & imaging results that were available during my care of the patient were reviewed by me and considered in my medical decision making (see chart for details).     MRI scan of cervical spine ordered in light of complaint of neck pain, fever, patient on blood thinners, and patient complained of numbness and weakness. I see no objective findings on physical exam 4: 20 p.m. patient feels improved after treatment with Tylenol. Numbness has resolved however she still  complains of mild pain in her neck. I discussed case with Dr. Ronnald Ramp from neurosurgery  who reviewed patient's MRI scan. He suggests that she follow-up in his office. No further treatment needed. Patient has mild renal insufficiency which she's had in the past Final Clinical Impressions(s) / ED Diagnoses  Diagnosis #1 generalized weakness #2 paresthesias Final diagnoses:  None  #3 mild renal insufficiency  New Prescriptions New Prescriptions   No medications on file     Orlie Dakin, MD 02/07/17 1636

## 2017-02-07 NOTE — ED Notes (Signed)
Patient denies any numbness or tingling at this time.

## 2017-02-07 NOTE — ED Notes (Signed)
MD at bedside,

## 2017-02-07 NOTE — ED Triage Notes (Addendum)
Pt presents with c/o tingling in her entire body. The tingling began this morning after she had sudden onset of neck and back pain. The tingling lasted briefly and resolved with her lying down to rest. Shes been taking tamiflu and an antibiotic since Thursday after she saw her doctor for flu like symptoms. She feels her flu symptoms have improved since thrusday

## 2017-02-07 NOTE — ED Notes (Signed)
Patient off the floor for scan  

## 2017-02-23 ENCOUNTER — Other Ambulatory Visit: Payer: Self-pay | Admitting: Nurse Practitioner

## 2017-02-23 MED ORDER — HYDROCHLOROTHIAZIDE 25 MG PO TABS: ORAL_TABLET | ORAL | 2 refills | Status: DC

## 2017-02-25 DIAGNOSIS — L918 Other hypertrophic disorders of the skin: Secondary | ICD-10-CM | POA: Diagnosis not present

## 2017-02-25 DIAGNOSIS — L820 Inflamed seborrheic keratosis: Secondary | ICD-10-CM | POA: Diagnosis not present

## 2017-02-25 DIAGNOSIS — D225 Melanocytic nevi of trunk: Secondary | ICD-10-CM | POA: Diagnosis not present

## 2017-02-25 DIAGNOSIS — D1801 Hemangioma of skin and subcutaneous tissue: Secondary | ICD-10-CM | POA: Diagnosis not present

## 2017-02-25 DIAGNOSIS — L821 Other seborrheic keratosis: Secondary | ICD-10-CM | POA: Diagnosis not present

## 2017-02-26 ENCOUNTER — Encounter: Payer: Self-pay | Admitting: Internal Medicine

## 2017-03-06 ENCOUNTER — Encounter: Payer: Self-pay | Admitting: Nurse Practitioner

## 2017-03-24 ENCOUNTER — Encounter: Payer: Self-pay | Admitting: Internal Medicine

## 2017-03-24 ENCOUNTER — Ambulatory Visit (INDEPENDENT_AMBULATORY_CARE_PROVIDER_SITE_OTHER): Payer: PPO | Admitting: Nurse Practitioner

## 2017-03-24 ENCOUNTER — Encounter (INDEPENDENT_AMBULATORY_CARE_PROVIDER_SITE_OTHER): Payer: Self-pay

## 2017-03-24 VITALS — BP 140/72 | HR 60 | Ht 62.5 in | Wt 192.0 lb

## 2017-03-24 DIAGNOSIS — I480 Paroxysmal atrial fibrillation: Secondary | ICD-10-CM | POA: Diagnosis not present

## 2017-03-24 DIAGNOSIS — I10 Essential (primary) hypertension: Secondary | ICD-10-CM | POA: Diagnosis not present

## 2017-03-24 NOTE — Progress Notes (Signed)
CARDIOLOGY OFFICE NOTE  Date:  03/24/2017    [name] Date of Birth: 09/05/1941 Medical Record #[id number]  PCP:  [name], MD  Cardiologist:  [facility]  Chief Complaint  Patient presents with  . Atrial Fibrillation    Follow up visit -seen for Dr. Rayann Heman    History of Present Illness: JIALI LINNEY is a 76 y.o. female who presents today for a follow up visit. Seen for Dr. Rayann Heman.   She has no known CAD. Does have PAF with prior ablation. Committed to long term anticoagulation - previously on Coumadin - now on Xarelto. Other issues include GERD, HLD, HTN, mild sleep apnea, bradycardia, restless legs, anxiety and obesity. She had a negative Myoview last December 2012. Last echo in 2013. Negative GXT in 2015. Labs are checked by primary care.   Last seen by Dr. Rayann Heman back in July of 2017 - was doing well. Echo was updated.   Since last visit - got diagnosed with lung cancer - found incidentally on a scan for her kidney due to possible stone. She has had R VATS by Dr. Roxan Hockey in early November. Her beta blocker was stopped during that admission due to symptomatic bradycardia. Now on Xarelto in place of Coumadin. I then saw her in December - little depressed due to the events. Following with oncology. Did not tolerate antidepressant therapy.   ER visit last month due to generalized weakness/fever. Had cervical spine MRI - was to see neurosurgery for discussion.   Comes back today. Here alone. She is doing ok. She is back on some anti depressant - but feels like this is making her restless leg worse - she is going to address this with her PCP. Still very anxious about her situation with her lungs.Notes she just thinks about this "all the time". CT scan next month and will see Dr. Earlie Server in follow up. Rhythm has been ok. She has not been as active. No chest pain.   Past Medical History:  Diagnosis Date  . Anxiety   . Arthritis   . Asthma    dx 10 yrs  ago  . Atrial fibrillation (HCC)    CHADS VASC score 3. Intolerant to flecainide. s/p afib ablation 02/12/12  . Colon polyps   . Diastolic dysfunction   . Dysrhythmia    a fib   dx 2014  . GERD (gastroesophageal reflux disease)   . History of kidney infection     last one was approx 2009  . Hyperlipidemia   . Hypertension   . Mild sleep apnea    wears NO equipment  . Mitral regurgitation    mild  . Neuromuscular disorder ([place])   . Obesity   . Plantar fasciitis   . Restless leg syndrome   . Sinus bradycardia   . Sleep apnea   . Stress bladder incontinence, female    WEARS SMALL PADS    Past Surgical History:  Procedure Laterality Date  . ABDOMINAL HYSTERECTOMY  1992   BSO  . APPENDECTOMY    . atrial fibrillation ablation  02/12/12   PVI by Dr Rayann Heman  . ATRIAL FIBRILLATION ABLATION N/A 02/12/2012   Procedure: ATRIAL FIBRILLATION ABLATION;  Surgeon: Thompson Grayer, MD;  Location[facility] CATH LAB;  Service: Cardiovascular;  Laterality: N/A;  . BLADDER SUSPENSION    . [place], 200,2004, 2007, 05/06/2011   adenomas  . heels spurs     x 2  . TEE WITHOUT  CARDIOVERSION  02/11/2012   Procedure: TRANSESOPHAGEAL ECHOCARDIOGRAM (TEE);  Surgeon: Larey Dresser, MD;  Location: [facility];  Service: Cardiovascular;  Laterality: N/A;  . TONSILLECTOMY AND ADENOIDECTOMY    . VARICOSE VEIN SURGERY    . VIDEO ASSISTED THORACOSCOPY (VATS)/WEDGE RESECTION Right 10/02/2016   Procedure: VIDEO ASSISTED THORACOSCOPY (VATS)/WEDGE RESECTION;  Surgeon: [name], MD;  Location: [facility];  Service: Thoracic;  Laterality: Right;     Medications: Current Outpatient Prescriptions  Medication Sig Dispense Refill  . calcium carbonate (OS-CAL) 600 MG TABS Take 600 mg by mouth daily.     . Cholecalciferol (VITAMIN D PO) Take 4,000 Units by mouth daily.      . clonazePAM (KLONOPIN) 0.5 MG tablet Take 0.25 mg by mouth 2 (two) times daily as needed for anxiety.     Marland Kitchen escitalopram  (LEXAPRO) 20 MG tablet     . fish oil-omega-3 fatty acids 1000 MG capsule Take 1 g by mouth 2 (two) times daily.     . hydrochlorothiazide (HYDRODIURIL) 25 MG tablet TAKE 1/2 TABLET EVERY DAY 45 tablet 2  . lisinopril (PRINIVIL,ZESTRIL) 40 MG tablet Take 40 mg by mouth daily.    Marland Kitchen lovastatin (MEVACOR) 40 MG tablet Take 80 mg by mouth daily.     Marland Kitchen MAGNESIUM PO Take 250 mg by mouth daily.    . Multiple Vitamins-Minerals (MULTIVITAMIN WITH MINERALS) tablet Take 1 tablet by mouth daily.      . nitroGLYCERIN (NITROSTAT) 0.4 MG SL tablet Place 1 tablet (0.4 mg total) under the tongue every 5 (five) minutes as needed for chest pain. 25 tablet 3  . potassium chloride SA (K-DUR,KLOR-CON) 20 MEQ tablet Take 20 mEq by mouth daily.    . ranitidine (ZANTAC) 150 MG capsule Take 150 mg by mouth 2 (two) times daily.    Marland Kitchen senna (SENOKOT) 8.6 MG tablet Take 1 tablet by mouth daily.     . VENTOLIN HFA 108 (90 Base) MCG/ACT inhaler Inhale 2 puffs into the lungs every 6 (six) hours as needed for wheezing or shortness of breath.     . vitamin E (VITAMIN E) 400 UNIT capsule Take 400 Units by mouth daily.     Alveda Reasons 20 MG TABS tablet Take 20 mg by mouth daily with supper.     . pantoprazole (PROTONIX) 40 MG tablet Take 1 tablet (40 mg total) by mouth daily. (Patient taking differently: Take 40 mg by mouth every evening. ) 30 tablet 11   No current facility-administered medications for this visit.     Allergies: Allergies  Allergen Reactions  . Codeine Other (See Comments)    Skin crawls, jittery, agitated  . Prednisone Other (See Comments)    Tachycardia, light headed, rash    Social History: The patient  reports that she quit smoking about 31 years ago. Her smoking use included Cigarettes. She has never used smokeless tobacco. She reports that she does not drink alcohol or use drugs.   Family History: The patient's family history includes Colon cancer in her maternal grandmother and maternal uncle;  Congestive Heart Failure in her mother; Diabetes in her brother; Hypertension in her brother; Lung cancer in her father.   Review of Systems: Please see the history of present illness.   Otherwise, the review of systems is positive for none.   All other systems are reviewed and negative.   Physical Exam: VS:  BP 140/72 (BP Location: Left Arm, Patient Position: Sitting, Cuff Size: Large)   Pulse 60  Ht 5' 2.5" (1.588 m)   Wt 192 lb (87.1 kg)   SpO2 98% Comment: at rest  BMI 34.56 kg/m  .  BMI Body mass index is 34.56 kg/m.  Wt Readings from Last 3 Encounters:  03/24/17 192 lb (87.1 kg)  01/27/17 185 lb (83.9 kg)  11/12/16 194 lb 12.8 oz (88.4 kg)    General: Pleasant. Obese. She is alert and in no acute distress. Little anxious.  HEENT: Normal.  Neck: Supple, no JVD, carotid bruits, or masses noted.  Cardiac: Regular rate and rhythm. No murmurs, rubs, or gallops. No edema.  Respiratory:  Lungs are clear to auscultation bilaterally with normal work of breathing.  GI: Soft and nontender.  MS: No deformity or atrophy. Gait and ROM intact.  Skin: Warm and dry. Color is normal.  Neuro:  Strength and sensation are intact and no gross focal deficits noted.  Psych: Alert, appropriate and with normal affect.   LABORATORY DATA:  EKG:  EKG is not ordered today.  Lab Results  Component Value Date   WBC 5.9 02/07/2017   HGB 12.4 02/07/2017   HCT 39.3 02/07/2017   PLT 205 02/07/2017   GLUCOSE 155 (H) 02/07/2017   ALT 30 10/27/2016   AST 28 10/27/2016   NA 135 02/07/2017   K 3.9 02/07/2017   CL 101 02/07/2017   CREATININE 1.34 (H) 02/07/2017   BUN 19 02/07/2017   CO2 23 02/07/2017   INR 1.25 09/29/2016    BNP (last 3 results) No results for input(s): BNP in the last 8760 hours.  ProBNP (last 3 results) No results for input(s): PROBNP in the last 8760 hours.   Other Studies Reviewed Today:  Echo Study Conclusions from 05/2016  - Left ventricle: The cavity size was  normal. There was moderate focal basal hypertrophy. Systolic function was normal. The estimated ejection fraction was in the range of 55% to 60%. Wall motion was normal; there were no regional wall motion abnormalities. The transmitral flow pattern was normal. Left ventricular diastolic function parameters were normal. - Mitral valve: Calcified annulus. - Right ventricle: The cavity size was mildly dilated. Wall thickness was normal.  GXT Comments from May 2015: Patient presents today for routine GXT. Has had chest pain - no known CAD. Has HTN, HLD and obesity. She held 2 doses of her beta blocker prior to this study.  Today the patient exercised on the standard Bruce protocol for a total of 6:30 minutes.  Reduced exercise tolerance.  Adequate blood pressure response.  Clinically negative for chest pain. Test was stopped due to fatigue/reported very mild chest pain that resolved quickly in recovery.  EKG negative for ischemia. Noted to have occasional PACs and PVCs. More PVC's noted at peak of exercise.    Assessment/Plan:   1. PAF - intolerant to flecainide and has had prior ablation - did have recurrence with recent lung surgery - not surprising - no longer on beta blocker due to symptomatic bradycardia - currently without recurrence. Remains in sinus by exam today and by EKG last month. She will let me know if she starts to have breakthrough or palpitations.   2. HTN - BP ok on her current regimen. She monitors at home.   3. Chronic anticoagulation - now on Xarelto - no problems noted. Recent labs noted  4. Obesity - discussed today - really tried to encourage her to let this anxiety helto motivate her to focus on her exercise/weight/diet   5. Prior VATS for lung  cancer - she has seen Dr. Earlie Server who has not recommended any adjuvant treatment. She has multiple small ground glass opacities - which will be followed by CT scan - scheduled for next month. Even  if low grade tumor it is felt that this could be treated early. Hopefully if this is stable this will help alleviate some of her worries.   6. Depression - discussed at length again today.    Current medicines are reviewed with the patient today.  The patient does not have concerns regarding medicines other than what has been noted above.  The following changes have been made:  See above.  Labs/ tests ordered today include:   No orders of the defined types were placed in this encounter.    Disposition:   FU with me in 6 months. She has a visit next month with Dr. Rayann Heman - do not feel that we need to keep this.    Patient is agreeable to this plan and will call if any problems develop in the interim.   SignedTruitt Merle, NP  03/24/2017 1:59 PM  [facility] [address] [place], [place]  [address] Phone: [phone] Fax: [phone]

## 2017-03-24 NOTE — Patient Instructions (Addendum)
We will be checking the following labs today - NONE   Medication Instructions:    Continue with your current medicines.     Testing/Procedures To Be Arranged:  N/A  Follow-Up:   See me in 6 months.    Other Special Instructions:   N/A    If you need a refill on your cardiac medications before your next appointment, please call your pharmacy.   Call the [facility] office at [phone] if you have any questions, problems or concerns.      

## 2017-04-03 ENCOUNTER — Ambulatory Visit: Payer: PPO | Admitting: Internal Medicine

## 2017-04-07 ENCOUNTER — Telehealth: Payer: Self-pay | Admitting: Internal Medicine

## 2017-04-07 NOTE — Telephone Encounter (Signed)
gaxed records to [facility]

## 2017-04-13 ENCOUNTER — Ambulatory Visit: Payer: PPO | Admitting: Internal Medicine

## 2017-04-15 IMAGING — CT CT CHEST W/ CM
2 of 4 series · 15 of 36 positions shown, 18 images · IV contrast (iopamidol)
Comparison: CT abdomen and pelvis 05/31/2016. PA and lateral chest
05/28/2016.

CLINICAL DATA: Pulmonary nodule identified on CT abdomen and pelvis
05/31/2016.

EXAM:
CT CHEST WITH CONTRAST
TECHNIQUE: Multidetector CT imaging of the chest was performed during
intravenous contrast administration.
CONTRAST:  75 ml [id number] IOPAMIDOL ([id number]) INJECTION 61%

[Series 2: chest w/cm · axial · 0.63mm/px · z∈[+613,+877]mm · 12 of 156 slices shown, 15 images]
[im 12/156  mediastinal]
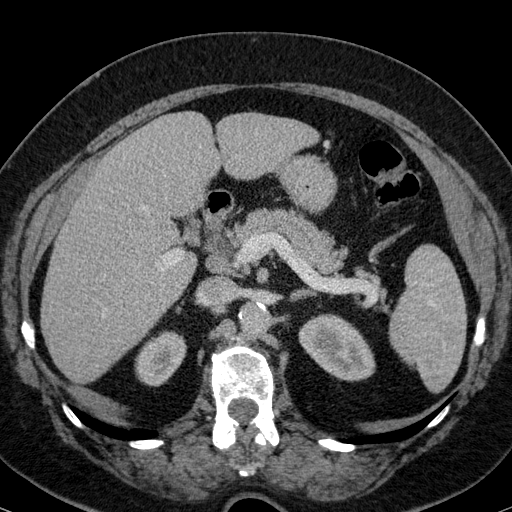
[im 12/156  lung]
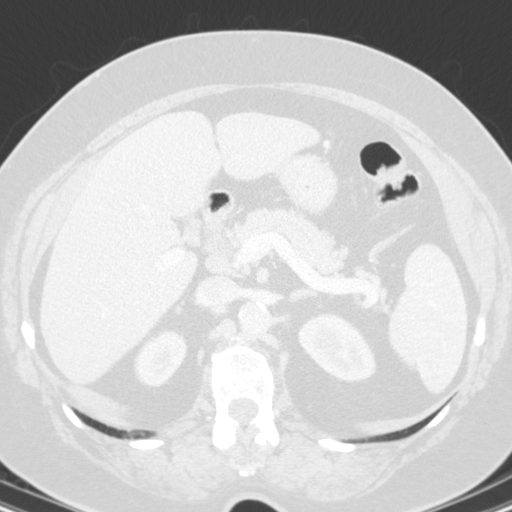
[im 23/156  lung]
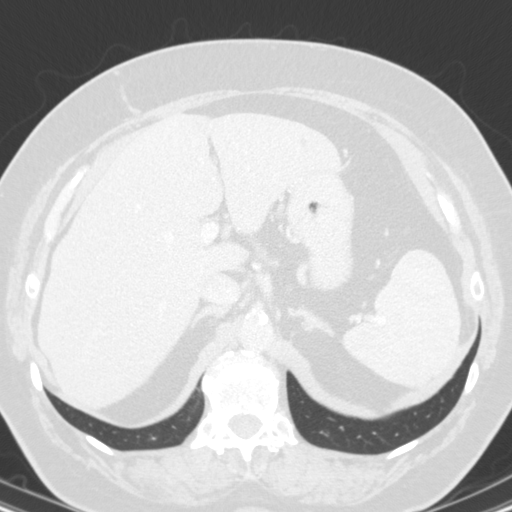
[im 34/156  lung]
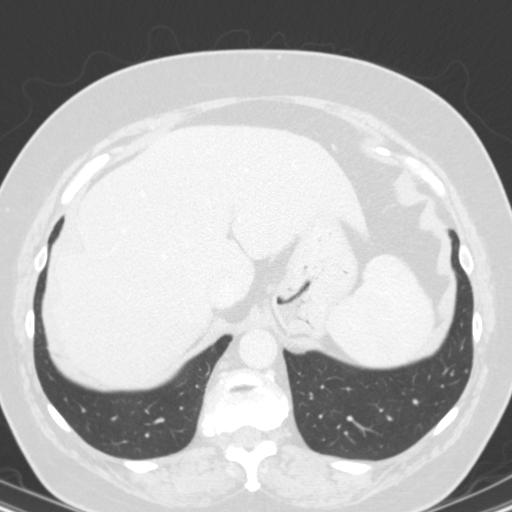
[im 45/156  lung]
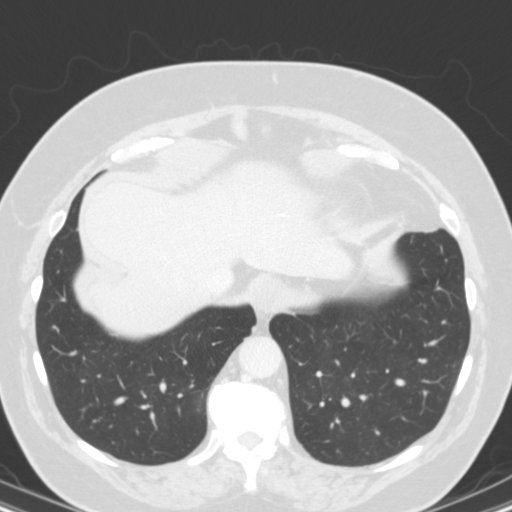
[im 56/156  mediastinal]
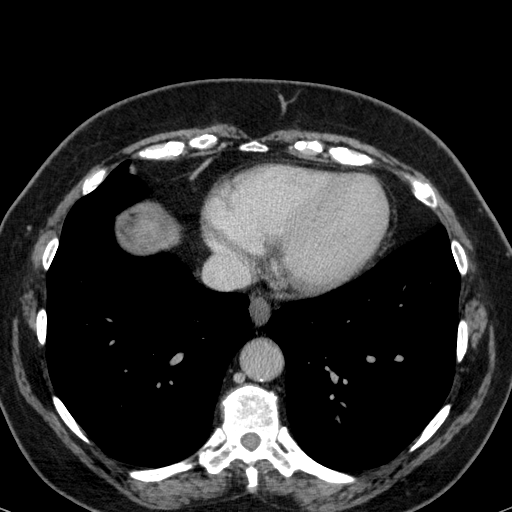
[im 56/156  lung]
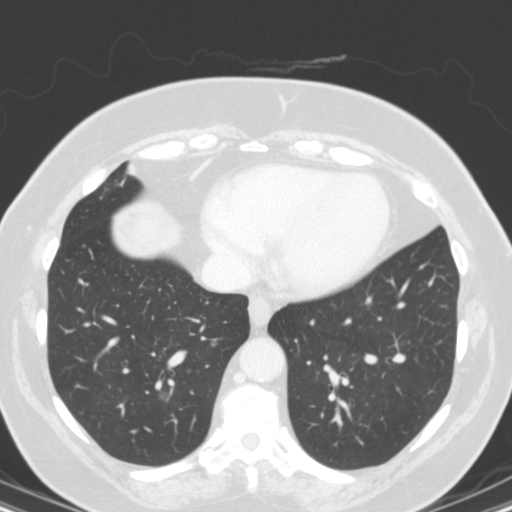
[im 67/156  lung]
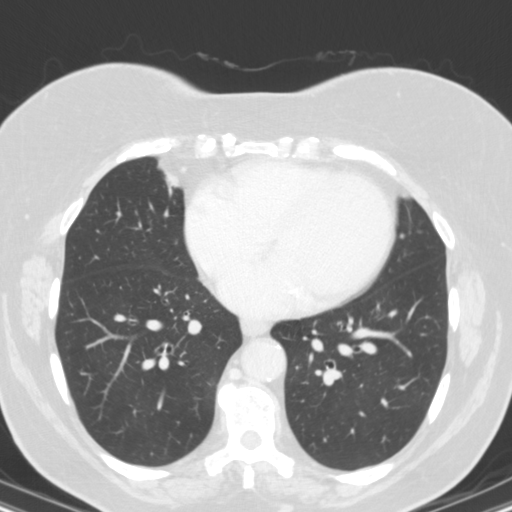
[im 89/156  lung]
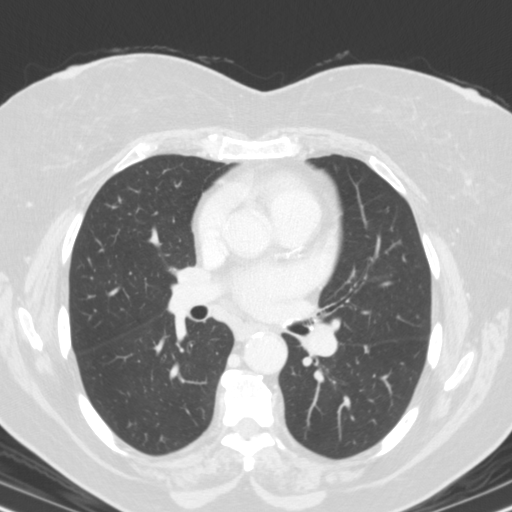
[im 100/156  lung]
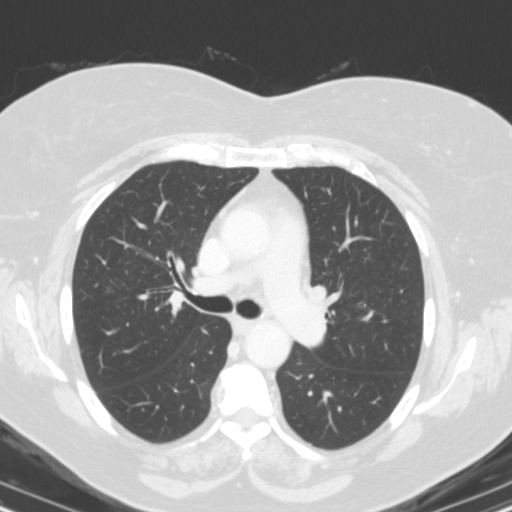
[im 111/156  mediastinal]
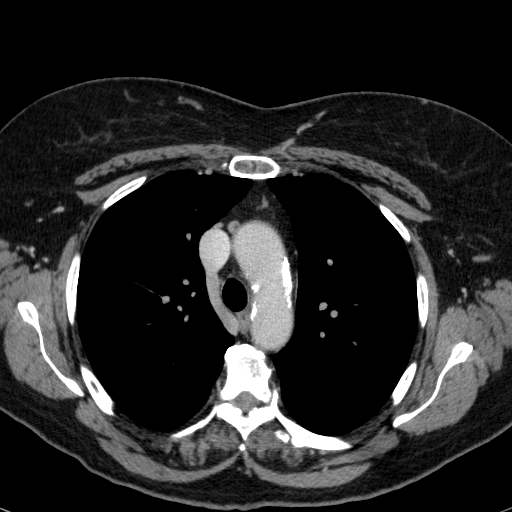
[im 111/156  lung]
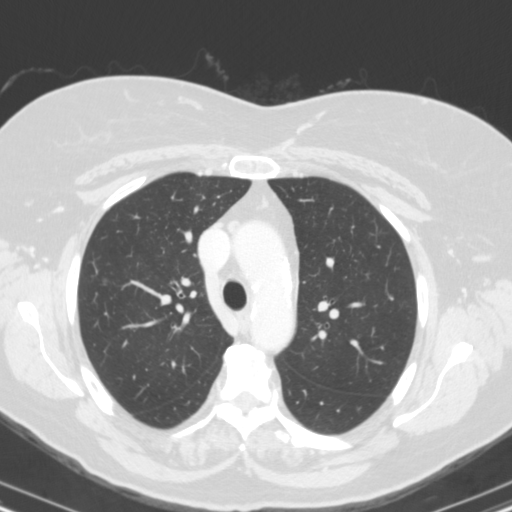
[im 122/156  lung]
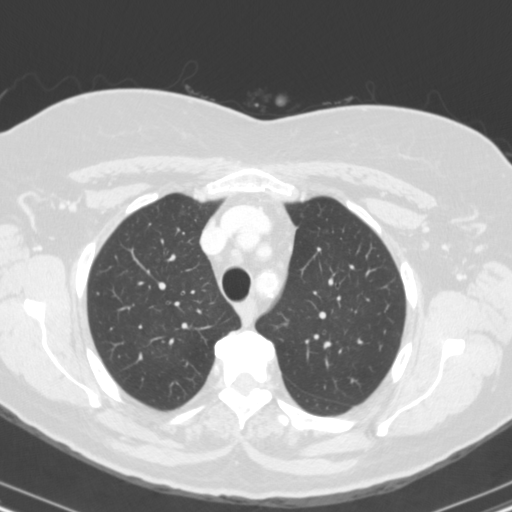
[im 133/156  lung]
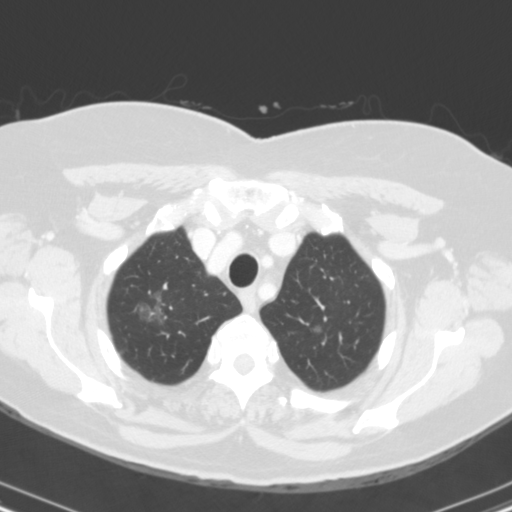
[im 144/156  lung]
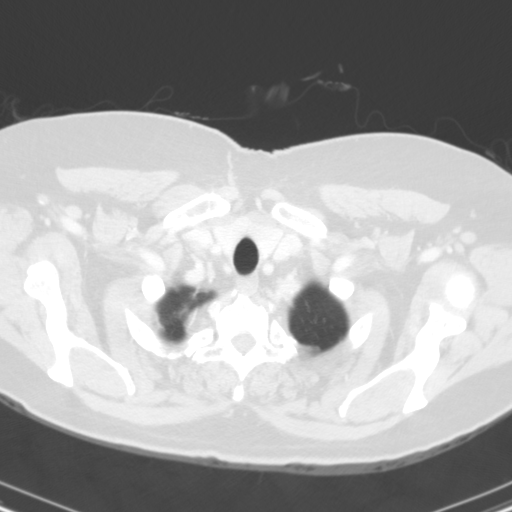

[Series 3: cor · coronal · 0.61mm/px · 3 of 129 slices shown]
[im 26/129  lung]
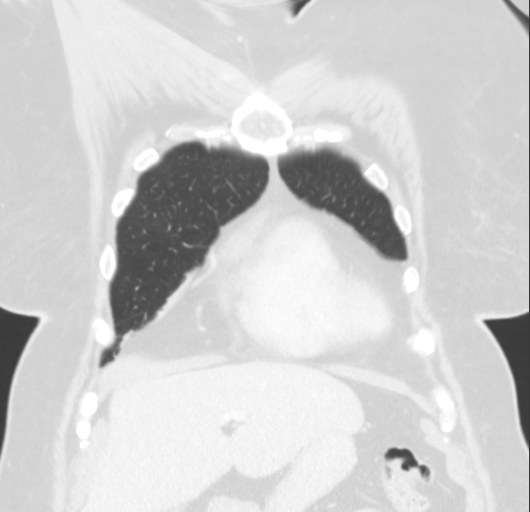
[im 52/129  lung]
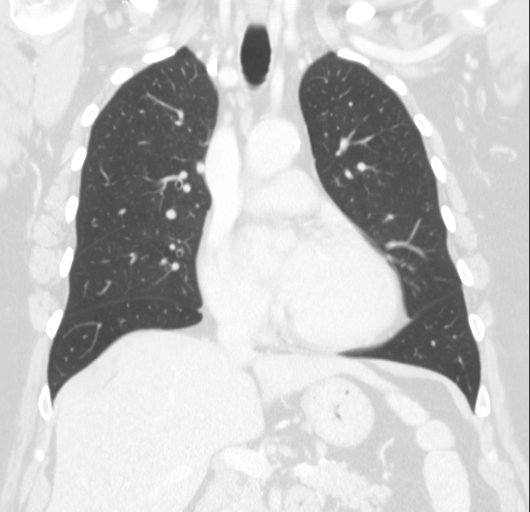
[im 77/129  lung]
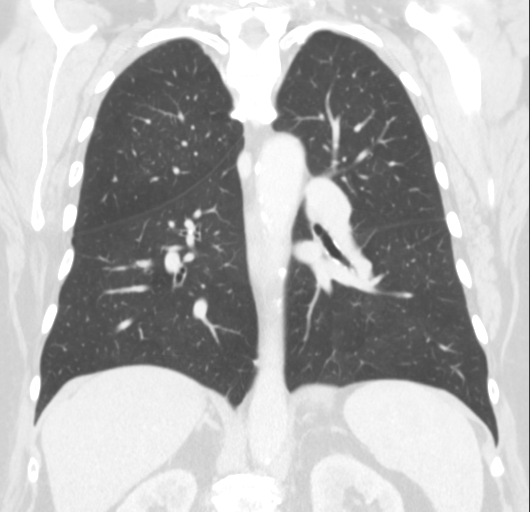

[15 of 36 positions shown; findings below may reference images not displayed]

FINDINGS: There is no axillary, hilar or mediastinal lymphadenopathy. Heart
size is upper normal. Calcific aortic and coronary atherosclerosis
is identified. No pleural or pericardial effusion. A ground-glass
nodule is seen in the right upper lobe on image 20 measuring 2.8 cm
in diameter. 1 cm ground-glass nodule in the right lung base on
image 99 is unchanged since the prior abdomen and pelvis CT scan.
There is also 0.9 cm right lower lobe ground-glass nodule on image
72. 0.5 cm subsolid nodule seen in the left lower lobe on image 67.
A subpleural nodule in the left lower lobe on image 103 measures
cm. Finally, ground-glass nodule in the left upper lobe on image 23
measures 0.6 cm.

Incidentally imaged upper abdomen demonstrates no focal abnormality.
No lytic or sclerotic bony lesion is identified.
IMPRESSION: Multiple ground-glass attenuating nodules. Non-contrast chest CT at
3-6 months is recommended. If nodules persist, subsequent management
will be based upon the most suspicious nodule(s). This
recommendation follows the consensus statement: Guidelines for
Management of Incidental Pulmonary Nodules Detected on CT
Images:From the [HOSPITAL] 3409; published online before
print (10.1148/radiol.[id number]).

## 2017-04-23 ENCOUNTER — Telehealth: Payer: Self-pay | Admitting: Internal Medicine

## 2017-04-23 NOTE — Telephone Encounter (Signed)
Left message on VM about appointment changes and a call back number

## 2017-04-24 ENCOUNTER — Other Ambulatory Visit: Payer: Commercial Managed Care - HMO

## 2017-04-28 ENCOUNTER — Ambulatory Visit: Payer: Commercial Managed Care - HMO | Admitting: Internal Medicine

## 2017-04-28 ENCOUNTER — Telehealth: Payer: Self-pay | Admitting: Internal Medicine

## 2017-04-28 NOTE — Telephone Encounter (Signed)
Scheduled lab appt per sch message from [place] - left message for patient with appt date and time.

## 2017-05-07 ENCOUNTER — Other Ambulatory Visit (HOSPITAL_BASED_OUTPATIENT_CLINIC_OR_DEPARTMENT_OTHER): Payer: PPO

## 2017-05-07 ENCOUNTER — Ambulatory Visit (HOSPITAL_COMMUNITY)
Admission: RE | Admit: 2017-05-07 | Discharge: 2017-05-07 | Disposition: A | Discharge status: Home / Self Care | Payer: PPO | Source: Ambulatory Visit | Attending: Internal Medicine | Admitting: Internal Medicine

## 2017-05-07 ENCOUNTER — Telehealth: Payer: Self-pay | Admitting: Medical Oncology

## 2017-05-07 DIAGNOSIS — C3491 Malignant neoplasm of unspecified part of right bronchus or lung: Secondary | ICD-10-CM | POA: Diagnosis not present

## 2017-05-07 DIAGNOSIS — K760 Fatty (change of) liver, not elsewhere classified: Secondary | ICD-10-CM | POA: Diagnosis not present

## 2017-05-07 DIAGNOSIS — G8918 Other acute postprocedural pain: Secondary | ICD-10-CM | POA: Insufficient documentation

## 2017-05-07 DIAGNOSIS — I2510 Atherosclerotic heart disease of native coronary artery without angina pectoris: Secondary | ICD-10-CM | POA: Insufficient documentation

## 2017-05-07 DIAGNOSIS — R918 Other nonspecific abnormal finding of lung field: Secondary | ICD-10-CM | POA: Diagnosis not present

## 2017-05-07 DIAGNOSIS — I700 Atherosclerosis of aorta: Secondary | ICD-10-CM | POA: Insufficient documentation

## 2017-05-07 DIAGNOSIS — Z9889 Other specified postprocedural states: Secondary | ICD-10-CM | POA: Insufficient documentation

## 2017-05-07 LAB — CBC WITH DIFFERENTIAL/PLATELET
BASO%: 0.4 % (ref 0.0–2.0)
Basophils Absolute: 0 10*3/uL (ref 0.0–0.1)
EOS%: 1.7 % (ref 0.0–7.0)
Eosinophils Absolute: 0.1 10*3/uL (ref 0.0–0.5)
HCT: 37.3 % (ref 34.8–46.6)
HGB: 12.4 g/dL (ref 11.6–15.9)
LYMPH%: 33 % (ref 14.0–49.7)
MCH: 29.3 pg (ref 25.1–34.0)
MCHC: 33.2 g/dL (ref 31.5–36.0)
MCV: 88.2 fL (ref 79.5–101.0)
MONO#: 0.5 10*3/uL (ref 0.1–0.9)
MONO%: 8.5 % (ref 0.0–14.0)
NEUT#: 3.4 10*3/uL (ref 1.5–6.5)
NEUT%: 56.4 % (ref 38.4–76.8)
Platelets: 245 10*3/uL (ref 145–400)
RBC: 4.23 10*6/uL (ref 3.70–5.45)
RDW: 14.2 % (ref 11.2–14.5)
WBC: 6 10*3/uL (ref 3.9–10.3)
lymph#: 2 10*3/uL (ref 0.9–3.3)

## 2017-05-07 LAB — COMPREHENSIVE METABOLIC PANEL
ALT: 21 U/L (ref 0–55)
AST: 20 U/L (ref 5–34)
Albumin: 3.8 g/dL (ref 3.5–5.0)
Alkaline Phosphatase: 90 U/L (ref 40–150)
Anion Gap: 10 mEq/L (ref 3–11)
BUN: 19.2 mg/dL (ref 7.0–26.0)
CO2: 28 mEq/L (ref 22–29)
Calcium: 9.8 mg/dL (ref 8.4–10.4)
Chloride: 106 mEq/L (ref 98–109)
Creatinine: 0.8 mg/dL (ref 0.6–1.1)
EGFR: 73 mL/min/{1.73_m2} — ABNORMAL LOW (ref >90)
Glucose: 103 mg/dl (ref 70–140)
Potassium: 4.2 mEq/L (ref 3.5–5.1)
Sodium: 143 mEq/L (ref 136–145)
Total Bilirubin: 0.35 mg/dL (ref 0.20–1.20)
Total Protein: 6.5 g/dL (ref 6.4–8.3)

## 2017-05-07 MED ORDER — IOPAMIDOL (ISOVUE-300) INJECTION 61%
75.0000 mL | Freq: Once | INTRAVENOUS | Status: AC | PRN
  Administered 2017-05-07: 75 mL via INTRAVENOUS

## 2017-05-07 MED ORDER — IOPAMIDOL (ISOVUE-300) INJECTION 61%
INTRAVENOUS | Status: AC
  Filled 2017-05-07: qty 75

## 2017-05-07 NOTE — Telephone Encounter (Signed)
I left a message for pt to return my call and specify what scan she thinks she is supposed to have.

## 2017-05-08 ENCOUNTER — Telehealth: Payer: Self-pay | Admitting: Medical Oncology

## 2017-05-08 NOTE — Telephone Encounter (Signed)
I returned call from on call documentation. I left message for pt to call me back with her concerns about tests done yesterday.

## 2017-05-12 ENCOUNTER — Ambulatory Visit (HOSPITAL_BASED_OUTPATIENT_CLINIC_OR_DEPARTMENT_OTHER): Payer: PPO | Admitting: Internal Medicine

## 2017-05-12 ENCOUNTER — Encounter: Payer: Self-pay | Admitting: Internal Medicine

## 2017-05-12 ENCOUNTER — Telehealth: Payer: Self-pay | Admitting: Internal Medicine

## 2017-05-12 VITALS — BP 152/59 | HR 78 | Temp 98.4°F | Resp 19 | Ht 62.5 in | Wt 192.6 lb

## 2017-05-12 DIAGNOSIS — C3491 Malignant neoplasm of unspecified part of right bronchus or lung: Secondary | ICD-10-CM

## 2017-05-12 DIAGNOSIS — C3411 Malignant neoplasm of upper lobe, right bronchus or lung: Secondary | ICD-10-CM

## 2017-05-12 DIAGNOSIS — I10 Essential (primary) hypertension: Secondary | ICD-10-CM

## 2017-05-12 NOTE — Progress Notes (Signed)
[facility] [phone]   [phone]  OFFICE PROGRESS NOTE  [name], [place] [address]  DIAGNOSIS: Stage Ib (T1c, N0, M0) non-small cell lung cancer, adenocarcinoma but the patient also has multifocal groundglass opacities in the lung bilaterally highly concerning for low-grade adenocarcinoma as well.  Genomic Alteration Identified? KRAS G12V Additional Findings? Microsatellite status MS-Stable Tumor Mutation Burden TMB-Intermediate; 11 Muts/Mb Additional Disease-relevant Genes with No Reportable Alterations Identified? EGFR ALK BRAF MET RET ERBB2 ROS1  PRIOR THERAPY: Status post wedge resection of the right upper lobe on 10/02/2016 under the care of Dr. Roxan Hockey.  CURRENT THERAPY: Observation.  INTERVAL HISTORY: Julie Gill 76 y.o. female returns to the clinic today for this is a clinic today for follow-up visit accompanied by her husband and daughter. The patient is feeling fine today with no specific complaints. She denied having any chest pain, shortness of breath, cough or hemoptysis. She denied having any fatigue or weakness. She has no nausea, vomiting, diarrhea or constipation. She denied having any recent weight loss or night sweats. The patient had repeat CT scan of the chest performed recently and she is here for evaluation and discussion of her scan results. She had medical studies performed by [facility] one after her surgical resection and it was positive only for KRAS mutation.  MEDICAL HISTORY: Past Medical History:  Diagnosis Date  . Anxiety   . Arthritis   . Asthma    dx 10 yrs ago  . Atrial fibrillation (HCC)    CHADS VASC score 3. Intolerant to flecainide. s/p afib ablation 02/12/12  . Colon polyps   . Diastolic dysfunction   . Dysrhythmia    a fib   dx 2014  . GERD (gastroesophageal reflux disease)   . History of kidney infection     last one was approx 2009  .  Hyperlipidemia   . Hypertension   . Mild sleep apnea    wears NO equipment  . Mitral regurgitation    mild  . Neuromuscular disorder ([place])   . Obesity   . Plantar fasciitis   . Restless leg syndrome   . Sinus bradycardia   . Sleep apnea   . Stress bladder incontinence, female    WEARS SMALL PADS    ALLERGIES:  is allergic to codeine and prednisone.  MEDICATIONS:  Current Outpatient Prescriptions  Medication Sig Dispense Refill  . calcium carbonate (OS-CAL) 600 MG TABS Take 600 mg by mouth daily.     . Cholecalciferol (VITAMIN D PO) Take 4,000 Units by mouth daily.      . clonazePAM (KLONOPIN) 0.5 MG tablet Take 0.25 mg by mouth 2 (two) times daily as needed for anxiety.     Marland Kitchen escitalopram (LEXAPRO) 20 MG tablet     . fish oil-omega-3 fatty acids 1000 MG capsule Take 1 g by mouth 2 (two) times daily.     . hydrochlorothiazide (HYDRODIURIL) 25 MG tablet TAKE 1/2 TABLET EVERY DAY 45 tablet 2  . lisinopril (PRINIVIL,ZESTRIL) 40 MG tablet Take 40 mg by mouth daily.    Marland Kitchen lovastatin (MEVACOR) 40 MG tablet Take 80 mg by mouth daily.     Marland Kitchen MAGNESIUM PO Take 250 mg by mouth daily.    . Multiple Vitamins-Minerals (MULTIVITAMIN WITH MINERALS) tablet Take 1 tablet by mouth daily.      . nitroGLYCERIN (NITROSTAT) 0.4 MG SL tablet Place 1 tablet (0.4 mg total) under the tongue every 5 (  five) minutes as needed for chest pain. 25 tablet 3  . pantoprazole (PROTONIX) 40 MG tablet Take 1 tablet (40 mg total) by mouth daily. (Patient taking differently: Take 40 mg by mouth every evening. ) 30 tablet 11  . potassium chloride SA (K-DUR,KLOR-CON) 20 MEQ tablet Take 20 mEq by mouth daily.    . ranitidine (ZANTAC) 150 MG capsule Take 150 mg by mouth 2 (two) times daily.    Marland Kitchen senna (SENOKOT) 8.6 MG tablet Take 1 tablet by mouth daily.     . VENTOLIN HFA 108 (90 Base) MCG/ACT inhaler Inhale 2 puffs into the lungs every 6 (six) hours as needed for wheezing or shortness of breath.     . vitamin E (VITAMIN E)  400 UNIT capsule Take 400 Units by mouth daily.     Alveda Reasons 20 MG TABS tablet Take 20 mg by mouth daily with supper.      No current facility-administered medications for this visit.     SURGICAL HISTORY:  Past Surgical History:  Procedure Laterality Date  . ABDOMINAL HYSTERECTOMY  1992   BSO  . APPENDECTOMY    . atrial fibrillation ablation  02/12/12   [facility] by Dr Rayann Heman  . ATRIAL FIBRILLATION ABLATION N/A 02/12/2012   Procedure: ATRIAL FIBRILLATION ABLATION;  Surgeon: Thompson Grayer, MD;  Location: [facility];  Service: Cardiovascular;  Laterality: N/A;  . BLADDER SUSPENSION    . [place], 200,2004, 2007, 05/06/2011   adenomas  . heels spurs     x 2  . TEE WITHOUT CARDIOVERSION  02/11/2012   Procedure: TRANSESOPHAGEAL ECHOCARDIOGRAM (TEE);  Surgeon: Larey Dresser, MD;  Location: [facility];  Service: Cardiovascular;  Laterality: N/A;  . TONSILLECTOMY AND ADENOIDECTOMY    . VARICOSE VEIN SURGERY    . VIDEO ASSISTED THORACOSCOPY (VATS)/WEDGE RESECTION Right 10/02/2016   Procedure: VIDEO ASSISTED THORACOSCOPY (VATS)/WEDGE RESECTION;  Surgeon: [name], MD;  Location: [facility];  Service: Thoracic;  Laterality: Right;    REVIEW OF SYSTEMS:  Constitutional: negative Eyes: negative Ears, nose, mouth, throat, and face: negative Respiratory: negative Cardiovascular: negative Gastrointestinal: negative Genitourinary:negative Integument/breast: negative Hematologic/lymphatic: negative Musculoskeletal:negative Neurological: negative Behavioral/Psych: negative Endocrine: negative Allergic/Immunologic: negative   PHYSICAL EXAMINATION: General appearance: alert, cooperative and no distress Head: Normocephalic, without obvious abnormality, atraumatic Neck: no adenopathy, no JVD, supple, symmetrical, trachea midline and thyroid not enlarged, symmetric, no tenderness/mass/nodules Lymph nodes: Cervical, supraclavicular, and axillary nodes normal. Resp:  clear to auscultation bilaterally Back: symmetric, no curvature. ROM normal. No CVA tenderness. Cardio: regular rate and rhythm, S1, S2 normal, no murmur, click, rub or gallop GI: soft, non-tender; bowel sounds normal; no masses,  no organomegaly Extremities: extremities normal, atraumatic, no cyanosis or edema Neurologic: Alert and oriented X 3, normal strength and tone. Normal symmetric reflexes. Normal coordination and gait  ECOG PERFORMANCE STATUS: 1 - Symptomatic but completely ambulatory  Blood pressure (!) 152/59, pulse 78, temperature 98.4 F (36.9 C), temperature source Oral, resp. rate 19, height 5' 2.5" (1.588 m), weight 192 lb 9.6 oz (87.4 kg), SpO2 100 %.  LABORATORY DATA: Lab Results  Component Value Date   WBC 6.0 05/07/2017   HGB 12.4 05/07/2017   HCT 37.3 05/07/2017   MCV 88.2 05/07/2017   PLT 245 05/07/2017      Chemistry      Component Value Date/Time   NA 143 05/07/2017 0904   K 4.2 05/07/2017 0904   CL 101 02/07/2017 1220   CO2 28 05/07/2017  0904   BUN 19.2 05/07/2017 0904   CREATININE 0.8 05/07/2017 0904      Component Value Date/Time   CALCIUM 9.8 05/07/2017 0904   ALKPHOS 90 05/07/2017 0904   AST 20 05/07/2017 0904   ALT 21 05/07/2017 0904   BILITOT 0.35 05/07/2017 0904       RADIOGRAPHIC STUDIES: Ct Chest W Contrast  Result Date: 05/07/2017 CLINICAL DATA:  76 year old female with history of lung cancer diagnosed in October 2017 treated with right upper lobectomy. Followup study. EXAM: CT CHEST WITH CONTRAST TECHNIQUE: Multidetector CT imaging of the chest was performed during intravenous contrast administration. CONTRAST:  24m ISOVUE-300 IOPAMIDOL (ISOVUE-300) INJECTION 61% COMPARISON:  Chest CT 09/09/2016. FINDINGS: Cardiovascular: Heart size is mildly enlarged. There is no significant pericardial fluid, thickening or pericardial calcification. There is aortic atherosclerosis, as well as atherosclerosis of the great vessels of the mediastinum and  the coronary arteries, including calcified atherosclerotic plaque in the left main and left anterior descending coronary arteries. Calcifications of the mitral annulus. Mediastinum/Nodes: There are several borderline enlarged and mildly enlarged right hilar and mediastinal lymph nodes. Specific examples are an 11 mm right hilar lymph node (axial image 61 of series 2) and 11 mm subcarinal lymph node (image 68 of series 2). Esophagus is unremarkable in appearance. No axillary lymphadenopathy. Lungs/Pleura: Compared to the prior examination there has been interval wedge resection in the right upper lobe. There is a suture line in the right upper lobe surrounded by a mass-like area of soft tissue thickening which measures approximately 3.5 x 2.0 cm (axial image 34 of series 5). Again noted are several tiny pulmonary nodules scattered throughout the lungs bilaterally, the majority of which are ground-glass attenuation in appearance. The largest of these is in the right lower lobe measuring 12 mm on today's examination (axial image 94 of series 5), previously only 10 mm on 09/09/2016. No acute consolidative airspace disease. No pleural effusions. Upper Abdomen: Diffuse low attenuation throughout the visualized hepatic parenchyma, compatible with hepatic steatosis. 11 mm simple cyst in segment 3 of the liver. Aortic atherosclerosis. Musculoskeletal: There are no aggressive appearing lytic or blastic lesions noted in the visualized portions of the skeleton. IMPRESSION: 1. Status post wedge resection in the right upper lobe. There is a mass-like area of soft tissue thickening along the suture line in the right upper lobe. While this could conceivably represent an area of evolving postoperative scarring, the possibility of residual/recurrent disease in this region should be considered, and further evaluation with PET-CT is recommended at this time. There also several borderline to mildly enlarged right hilar and mediastinal  lymph nodes which should be evaluated by PET-CT. 2. There continue to be numerous pulmonary nodules scattered throughout the lungs bilaterally, the majority of which are ground-glass attenuation in appearance, including some that show interval growth (most notably the 12 mm right lower lobe lesion discussed above). The overall appearance is concerning for multifocal adenocarcinoma (either multicentric disease or so-called "aerogenous" metastases). Attention on followup studies is recommended. 3. Aortic atherosclerosis, in addition to left main and left anterior descending coronary artery disease. Assessment for potential risk factor modification, dietary therapy or pharmacologic therapy may be warranted, if clinically indicated. 4. Hepatic steatosis. 5. Additional incidental findings, as above. Electronically Signed   By: DVinnie LangtonM.D.   On: 05/07/2017 13:35    ASSESSMENT AND PLAN: This is a very pleasant 76years old white female with stage IB non-small cell lung cancer, adenocarcinoma with no actionable mutations that  the patient has several other groundglass opacities and suspicious mediastinal lymph nodes. She status post wedge resection of the right upper lobe and has been observation since that time. The patient is feeling fine today. She had a recent CT scan of the chest. I personally and independently reviewed the scan images and discuss the results and showed the images to the patient and her family. The scan is definitely showed some suspicious finding with the bilateral groundglass opacities as well as the enlarged hilar and mediastinal lymph nodes. I strongly recommended for the patient to have a PET scan performed for further evaluation of her disease. I will see her back for follow-up visit in 2 weeks for reevaluation and discussion of her treatment options based on the PET scan results. If the PET scan is suspicious for malignancy, I may consider the patient for treatment with  systemic chemotherapy plus/minus immunotherapy. The patient was advised to call immediately if she has any concerning symptoms in the interval. The patient voices understanding of current disease status and treatment options and is in agreement with the current care plan.  All questions were answered. The patient knows to call the clinic with any problems, questions or concerns. We can certainly see the patient much sooner if necessary.  I spent 15 minutes counseling the patient face to face. The total time spent in the appointment was 25 minutes.  Disclaimer: This note was dictated with voice recognition software. Similar sounding words can inadvertently be transcribed and may not be corrected upon review.

## 2017-05-12 NOTE — Telephone Encounter (Signed)
Scheduled appt per 6/12 los - Gave patient AVS and calender per 6/12 - [facility] to contact patient with CT schedule.

## 2017-05-15 ENCOUNTER — Encounter: Payer: Self-pay | Admitting: *Deleted

## 2017-05-15 NOTE — Progress Notes (Signed)
Oncology Nurse Navigator Documentation  Oncology Nurse Navigator Flowsheets 05/15/2017  Navigator Encounter Type Other/per Dr. Worthy Flank request, I asked pathology dept to send tissue obtained on 10/02/16 for PDL 1 testing.  Per Dr. Julien Nordmann, he would like patient to have PET scan.  I will follow up with authorization coordinator to help expedite.  Acuity Level 2  Acuity Level 2 Other  Time Spent with Patient 15

## 2017-05-18 ENCOUNTER — Other Ambulatory Visit (HOSPITAL_COMMUNITY)
Admission: RE | Admit: 2017-05-18 | Discharge: 2017-05-18 | Disposition: A | Discharge status: Home / Self Care | Payer: PPO | Source: Ambulatory Visit | Attending: Internal Medicine | Admitting: Internal Medicine

## 2017-05-18 DIAGNOSIS — C3411 Malignant neoplasm of upper lobe, right bronchus or lung: Secondary | ICD-10-CM | POA: Insufficient documentation

## 2017-05-19 ENCOUNTER — Encounter: Payer: Self-pay | Admitting: *Deleted

## 2017-05-19 NOTE — Progress Notes (Signed)
Oncology Nurse Navigator Documentation  Oncology Nurse Navigator Flowsheets 05/19/2017  Navigator Location [facility]  Navigator Encounter Type Other/I was updated by [facility] dept, PDL 1 testing went out on 05/18/17  Treatment Phase Pre-Tx/Tx Discussion  Barriers/Navigation Needs Coordination of Care  Interventions Coordination of Care  Coordination of Care Other  Acuity Level 1  Time Spent with Patient 15

## 2017-05-25 ENCOUNTER — Encounter (HOSPITAL_COMMUNITY): Payer: Self-pay

## 2017-05-27 ENCOUNTER — Ambulatory Visit (HOSPITAL_COMMUNITY)
Admission: RE | Admit: 2017-05-27 | Discharge: 2017-05-27 | Disposition: A | Discharge status: Home / Self Care | Payer: PPO | Source: Ambulatory Visit | Attending: Internal Medicine | Admitting: Internal Medicine

## 2017-05-27 DIAGNOSIS — I2510 Atherosclerotic heart disease of native coronary artery without angina pectoris: Secondary | ICD-10-CM | POA: Diagnosis not present

## 2017-05-27 DIAGNOSIS — I700 Atherosclerosis of aorta: Secondary | ICD-10-CM | POA: Insufficient documentation

## 2017-05-27 DIAGNOSIS — C3490 Malignant neoplasm of unspecified part of unspecified bronchus or lung: Secondary | ICD-10-CM | POA: Diagnosis not present

## 2017-05-27 DIAGNOSIS — C3491 Malignant neoplasm of unspecified part of right bronchus or lung: Secondary | ICD-10-CM | POA: Insufficient documentation

## 2017-05-27 DIAGNOSIS — K5730 Diverticulosis of large intestine without perforation or abscess without bleeding: Secondary | ICD-10-CM | POA: Insufficient documentation

## 2017-05-27 DIAGNOSIS — I10 Essential (primary) hypertension: Secondary | ICD-10-CM | POA: Diagnosis not present

## 2017-05-27 DIAGNOSIS — R918 Other nonspecific abnormal finding of lung field: Secondary | ICD-10-CM | POA: Insufficient documentation

## 2017-05-27 LAB — GLUCOSE, CAPILLARY: Glucose-Capillary: 104 mg/dL — ABNORMAL HIGH (ref 65–99)

## 2017-05-27 MED ORDER — FLUDEOXYGLUCOSE F - 18 (FDG) INJECTION
9.4600 | Freq: Once | INTRAVENOUS | Status: AC | PRN
  Administered 2017-05-27: 9.46 via INTRAVENOUS

## 2017-06-01 ENCOUNTER — Encounter: Payer: Self-pay | Admitting: Internal Medicine

## 2017-06-01 ENCOUNTER — Ambulatory Visit (HOSPITAL_BASED_OUTPATIENT_CLINIC_OR_DEPARTMENT_OTHER): Payer: PPO | Admitting: Internal Medicine

## 2017-06-01 VITALS — BP 152/62 | HR 79 | Temp 99.0°F | Resp 18 | Ht 62.5 in | Wt 190.1 lb

## 2017-06-01 DIAGNOSIS — I10 Essential (primary) hypertension: Secondary | ICD-10-CM | POA: Diagnosis not present

## 2017-06-01 DIAGNOSIS — C3491 Malignant neoplasm of unspecified part of right bronchus or lung: Secondary | ICD-10-CM | POA: Diagnosis not present

## 2017-06-01 NOTE — Progress Notes (Signed)
[place] [phone]   [phone]  OFFICE PROGRESS NOTE  [name], [place] [address]  DIAGNOSIS: Stage Ib (T1c, N0, M0) non-small cell lung cancer, adenocarcinoma but the patient also has multifocal groundglass opacities in the lung bilaterally highly concerning for low-grade adenocarcinoma as well.  Genomic Alteration Identified? KRAS G12V Additional Findings? Microsatellite status MS-Stable Tumor Mutation Burden TMB-Intermediate; 11 Muts/Mb Additional Disease-relevant Genes with No Reportable Alterations Identified? EGFR ALK BRAF MET RET ERBB2 ROS1   PDL1 expression 5%.  PRIOR THERAPY: Status post wedge resection of the right upper lobe on 10/02/2016 under the care of Dr. Roxan Hockey.  CURRENT THERAPY: Observation.  INTERVAL HISTORY: Julie Gill 76 y.o. female returns to the clinic today for follow-up visit accompanied by her husband and daughter. The patient is feeling fine today with no specific complaints but she is very anxious about the results of her PET scan. She denied having any chest pain, shortness breath, cough or hemoptysis. She denied having any fever or chills. She has no nausea, vomiting, diarrhea or constipation. The patient denied having any recent weight loss or night sweats. She is here today for evaluation and discussion of the PET scan results and treatment options.  MEDICAL HISTORY: Past Medical History:  Diagnosis Date  . Anxiety   . Arthritis   . Asthma    dx 10 yrs ago  . Atrial fibrillation (HCC)    CHADS VASC score 3. Intolerant to flecainide. s/p afib ablation 02/12/12  . Colon polyps   . Diastolic dysfunction   . Dysrhythmia    a fib   dx 2014  . GERD (gastroesophageal reflux disease)   . History of kidney infection     last one was approx 2009  . Hyperlipidemia   . Hypertension   . Mild sleep apnea    wears NO equipment  . Mitral regurgitation    mild    . Neuromuscular disorder ([place])   . Obesity   . Plantar fasciitis   . Restless leg syndrome   . Sinus bradycardia   . Sleep apnea   . Stress bladder incontinence, female    WEARS SMALL PADS    ALLERGIES:  is allergic to codeine and prednisone.  MEDICATIONS:  Current Outpatient Prescriptions  Medication Sig Dispense Refill  . calcium carbonate (OS-CAL) 600 MG TABS Take 600 mg by mouth daily.     . Cholecalciferol (VITAMIN D PO) Take 4,000 Units by mouth daily.      . clonazePAM (KLONOPIN) 0.5 MG tablet Take 0.25 mg by mouth 2 (two) times daily as needed for anxiety.     . fish oil-omega-3 fatty acids 1000 MG capsule Take 1 g by mouth 2 (two) times daily.     . hydrochlorothiazide (HYDRODIURIL) 25 MG tablet TAKE 1/2 TABLET EVERY DAY 45 tablet 2  . lisinopril (PRINIVIL,ZESTRIL) 40 MG tablet Take 40 mg by mouth daily.    Marland Kitchen lovastatin (MEVACOR) 40 MG tablet Take 80 mg by mouth daily.     Marland Kitchen MAGNESIUM PO Take 250 mg by mouth daily.    . Multiple Vitamins-Minerals (MULTIVITAMIN WITH MINERALS) tablet Take 1 tablet by mouth daily.      . nitroGLYCERIN (NITROSTAT) 0.4 MG SL tablet Place 1 tablet (0.4 mg total) under the tongue every 5 (five) minutes as needed for chest pain. 25 tablet 3  . potassium chloride SA (K-DUR,KLOR-CON) 20 MEQ tablet Take 20 mEq by mouth daily.    Marland Kitchen  ranitidine (ZANTAC) 150 MG capsule Take 150 mg by mouth 2 (two) times daily.    Marland Kitchen senna (SENOKOT) 8.6 MG tablet Take 1 tablet by mouth daily.     . VENTOLIN HFA 108 (90 Base) MCG/ACT inhaler Inhale 2 puffs into the lungs every 6 (six) hours as needed for wheezing or shortness of breath.     . vitamin E (VITAMIN E) 400 UNIT capsule Take 400 Units by mouth daily.     Alveda Reasons 20 MG TABS tablet Take 20 mg by mouth daily with supper.     . pantoprazole (PROTONIX) 40 MG tablet Take 1 tablet (40 mg total) by mouth daily. (Patient taking differently: Take 40 mg by mouth every evening. ) 30 tablet 11   No current  facility-administered medications for this visit.     SURGICAL HISTORY:  Past Surgical History:  Procedure Laterality Date  . ABDOMINAL HYSTERECTOMY  1992   BSO  . APPENDECTOMY    . atrial fibrillation ablation  02/12/12   PVI by Dr Rayann Heman  . ATRIAL FIBRILLATION ABLATION N/A 02/12/2012   Procedure: ATRIAL FIBRILLATION ABLATION;  Surgeon: Thompson Grayer, MD;  Location: [facility] CATH LAB;  Service: Cardiovascular;  Laterality: N/A;  . BLADDER SUSPENSION    . [place], 200,2004, 2007, 05/06/2011   adenomas  . heels spurs     x 2  . TEE WITHOUT CARDIOVERSION  02/11/2012   Procedure: TRANSESOPHAGEAL ECHOCARDIOGRAM (TEE);  Surgeon: Larey Dresser, MD;  Location: [place];  Service: Cardiovascular;  Laterality: N/A;  . TONSILLECTOMY AND ADENOIDECTOMY    . VARICOSE VEIN SURGERY    . VIDEO ASSISTED THORACOSCOPY (VATS)/WEDGE RESECTION Right 10/02/2016   Procedure: VIDEO ASSISTED THORACOSCOPY (VATS)/WEDGE RESECTION;  Surgeon: [name], MD;  Location: [place];  Service: Thoracic;  Laterality: Right;    REVIEW OF SYSTEMS:  Constitutional: negative Eyes: negative Ears, nose, mouth, throat, and face: negative Respiratory: negative Cardiovascular: negative Gastrointestinal: negative Genitourinary:negative Integument/breast: negative Hematologic/lymphatic: negative Musculoskeletal:negative Neurological: negative Behavioral/Psych: negative Endocrine: negative Allergic/Immunologic: negative   PHYSICAL EXAMINATION: General appearance: alert, cooperative and no distress Head: Normocephalic, without obvious abnormality, atraumatic Neck: no adenopathy, no JVD, supple, symmetrical, trachea midline and thyroid not enlarged, symmetric, no tenderness/mass/nodules Lymph nodes: Cervical, supraclavicular, and axillary nodes normal. Resp: clear to auscultation bilaterally Back: symmetric, no curvature. ROM normal. No CVA tenderness. Cardio: regular rate and rhythm, S1, S2  normal, no murmur, click, rub or gallop GI: soft, non-tender; bowel sounds normal; no masses,  no organomegaly Extremities: extremities normal, atraumatic, no cyanosis or edema Neurologic: Alert and oriented X 3, normal strength and tone. Normal symmetric reflexes. Normal coordination and gait  ECOG PERFORMANCE STATUS: 1 - Symptomatic but completely ambulatory  Blood pressure (!) 152/62, pulse 79, temperature 99 F (37.2 C), temperature source Oral, resp. rate 18, height 5' 2.5" (1.588 m), weight 190 lb 1.6 oz (86.2 kg), SpO2 99 %.  LABORATORY DATA: Lab Results  Component Value Date   WBC 6.0 05/07/2017   HGB 12.4 05/07/2017   HCT 37.3 05/07/2017   MCV 88.2 05/07/2017   PLT 245 05/07/2017      Chemistry      Component Value Date/Time   NA 143 05/07/2017 0904   K 4.2 05/07/2017 0904   CL 101 02/07/2017 1220   CO2 28 05/07/2017 0904   BUN 19.2 05/07/2017 0904   CREATININE 0.8 05/07/2017 0904      Component Value Date/Time   CALCIUM 9.8 05/07/2017 0904  ALKPHOS 90 05/07/2017 0904   AST 20 05/07/2017 0904   ALT 21 05/07/2017 0904   BILITOT 0.35 05/07/2017 3244       RADIOGRAPHIC STUDIES: Ct Chest W Contrast  Result Date: 05/07/2017 CLINICAL DATA:  76 year old female with history of lung cancer diagnosed in October 2017 treated with right upper lobectomy. Followup study. EXAM: CT CHEST WITH CONTRAST TECHNIQUE: Multidetector CT imaging of the chest was performed during intravenous contrast administration. CONTRAST:  57m ISOVUE-300 IOPAMIDOL (ISOVUE-300) INJECTION 61% COMPARISON:  Chest CT 09/09/2016. FINDINGS: Cardiovascular: Heart size is mildly enlarged. There is no significant pericardial fluid, thickening or pericardial calcification. There is aortic atherosclerosis, as well as atherosclerosis of the great vessels of the mediastinum and the coronary arteries, including calcified atherosclerotic plaque in the left main and left anterior descending coronary arteries.  Calcifications of the mitral annulus. Mediastinum/Nodes: There are several borderline enlarged and mildly enlarged right hilar and mediastinal lymph nodes. Specific examples are an 11 mm right hilar lymph node (axial image 61 of series 2) and 11 mm subcarinal lymph node (image 68 of series 2). Esophagus is unremarkable in appearance. No axillary lymphadenopathy. Lungs/Pleura: Compared to the prior examination there has been interval wedge resection in the right upper lobe. There is a suture line in the right upper lobe surrounded by a mass-like area of soft tissue thickening which measures approximately 3.5 x 2.0 cm (axial image 34 of series 5). Again noted are several tiny pulmonary nodules scattered throughout the lungs bilaterally, the majority of which are ground-glass attenuation in appearance. The largest of these is in the right lower lobe measuring 12 mm on today's examination (axial image 94 of series 5), previously only 10 mm on 09/09/2016. No acute consolidative airspace disease. No pleural effusions. Upper Abdomen: Diffuse low attenuation throughout the visualized hepatic parenchyma, compatible with hepatic steatosis. 11 mm simple cyst in segment 3 of the liver. Aortic atherosclerosis. Musculoskeletal: There are no aggressive appearing lytic or blastic lesions noted in the visualized portions of the skeleton. IMPRESSION: 1. Status post wedge resection in the right upper lobe. There is a mass-like area of soft tissue thickening along the suture line in the right upper lobe. While this could conceivably represent an area of evolving postoperative scarring, the possibility of residual/recurrent disease in this region should be considered, and further evaluation with PET-CT is recommended at this time. There also several borderline to mildly enlarged right hilar and mediastinal lymph nodes which should be evaluated by PET-CT. 2. There continue to be numerous pulmonary nodules scattered throughout the lungs  bilaterally, the majority of which are ground-glass attenuation in appearance, including some that show interval growth (most notably the 12 mm right lower lobe lesion discussed above). The overall appearance is concerning for multifocal adenocarcinoma (either multicentric disease or so-called "aerogenous" metastases). Attention on followup studies is recommended. 3. Aortic atherosclerosis, in addition to left main and left anterior descending coronary artery disease. Assessment for potential risk factor modification, dietary therapy or pharmacologic therapy may be warranted, if clinically indicated. 4. Hepatic steatosis. 5. Additional incidental findings, as above. Electronically Signed   By: DVinnie LangtonM.D.   On: 05/07/2017 13:35   Nm Pet Image Initial (pi) Skull Base To Thigh  Result Date: 05/27/2017 CLINICAL DATA:  Initial treatment strategy for right lung adenocarcinoma. EXAM: NUCLEAR MEDICINE PET SKULL BASE TO THIGH TECHNIQUE: 9.5 mCi F-18 FDG was injected intravenously. Full-ring PET imaging was performed from the skull base to thigh after the radiotracer. CT data was  obtained and used for attenuation correction and anatomic localization. FASTING BLOOD GLUCOSE:  Value: 104 mg/dl COMPARISON:  Multiple exams, including 05/07/2017 FINDINGS: NECK No hypermetabolic lymph nodes in the neck. CHEST The previously described nodularity along the right upper lobe staple line has a similar appearance to the 05/07/2017 exam. Maximum SUV of this process is 2.9. No hypermetabolic adenopathy in the chest. The ground-glass pulmonary nodules described on recent chest CTA higher mostly small, although 1 of these lesions in the right lower lobe measures up to 1.2 cm. None of these ground-glass density nodules are hypermetabolic, although low-grade adenocarcinoma can often be negative on PET-CT. Coronary, aortic arch, and branch vessel atherosclerotic vascular disease. ABDOMEN/PELVIS No abnormal hypermetabolic activity  within the liver, pancreas, adrenal glands, or spleen. No hypermetabolic lymph nodes in the abdomen or pelvis. Physiologic activity observed in loops of bowel, particularly the large bowel. There is some focal accentuated metabolic activity in the rectum, maximum SUV 9.0, without a definite CT correlate, this may well simply be physiologic. Photopenic hypodense lesion inferiorly in segment 3 of the liver. Aortoiliac atherosclerotic vascular disease. Sigmoid colon diverticulosis. SKELETON No focal hypermetabolic activity to suggest skeletal metastasis. IMPRESSION: 1. The nodularity along the right upper lobe staple line has a maximum SUV of 2.9. Although this is only low-grade activity, I do note that the original tumor was a ground-glass density lesion which presumably was also a low-grade malignancy. Accordingly I am uncertain whether this activity along the staple line represents scarring or recurrent low-grade malignancy. Workup possibilities include surveillance or biopsy. 2. There are multiple additional small ground-glass density nodules which are not currently hypermetabolic, although please note that ground-glass density nodules can often be falsely negative fat PET-CT, an these likely also warrant careful surveillance. 3. Physiologic activity in much of the large bowel. There is some focal activity in the rectum without CT correlate, probably physiologic, less likely to be due to CT-occult rectal tumor. 4.  Aortic Atherosclerosis (ICD10-I70.0).  Coronary atherosclerosis. 5. Sigmoid colon diverticulosis. 6. Probable cyst in segment 3 of the liver. Electronically Signed   By: Van Clines M.D.   On: 05/27/2017 11:30    ASSESSMENT AND PLAN:  This is a very pleasant 76 years old white female with stage IB non-small cell lung cancer, adenocarcinoma with no actionable mutations and several other groundglass opacities bilaterally. Her last CT scan of the chest showed concerning findings including the  bilateral groundglass opacities as well as enlarged hilar and mediastinal lymph nodes. She had a recent PET scan. I personally and independently reviewed the scans and discussed the results with the patient and her family gives him copy of the report. Her scan showed faint metabolic activity in the ground glass opacities and no significant metabolic activity in the hilar or mediastinal lymph nodes. I explained to the patient that the groundglass opacities are still highly suspicious for low-grade adenocarcinoma and will need continuous observation.  I recommended for her to continue on observation for now with repeat CT scan of the chest in 4 months for restaging of her disease. She was advised to call immediately if she has any concerning symptoms in the interval. The patient and her daughter had several questions and I answered them completely to their satisfactions. She was advised to call immediately if she has any concerning symptoms in the interval. The patient voices understanding of current disease status and treatment options and is in agreement with the current care plan.  All questions were answered. The patient knows  to call the clinic with any problems, questions or concerns. We can certainly see the patient much sooner if necessary.  I spent 15 minutes counseling the patient face to face. The total time spent in the appointment was 25 minutes.  Disclaimer: This note was dictated with voice recognition software. Similar sounding words can inadvertently be transcribed and may not be corrected upon review.

## 2017-08-10 DIAGNOSIS — Z7901 Long term (current) use of anticoagulants: Secondary | ICD-10-CM | POA: Diagnosis not present

## 2017-08-10 DIAGNOSIS — J309 Allergic rhinitis, unspecified: Secondary | ICD-10-CM | POA: Diagnosis not present

## 2017-08-10 DIAGNOSIS — G2581 Restless legs syndrome: Secondary | ICD-10-CM | POA: Diagnosis not present

## 2017-08-10 DIAGNOSIS — E782 Mixed hyperlipidemia: Secondary | ICD-10-CM | POA: Diagnosis not present

## 2017-08-10 DIAGNOSIS — I480 Paroxysmal atrial fibrillation: Secondary | ICD-10-CM | POA: Diagnosis not present

## 2017-08-10 DIAGNOSIS — K219 Gastro-esophageal reflux disease without esophagitis: Secondary | ICD-10-CM | POA: Diagnosis not present

## 2017-08-10 DIAGNOSIS — Z79899 Other long term (current) drug therapy: Secondary | ICD-10-CM | POA: Diagnosis not present

## 2017-08-10 DIAGNOSIS — F324 Major depressive disorder, single episode, in partial remission: Secondary | ICD-10-CM | POA: Diagnosis not present

## 2017-08-10 DIAGNOSIS — Z0000 Encounter for general adult medical examination without abnormal findings: Secondary | ICD-10-CM | POA: Diagnosis not present

## 2017-08-10 DIAGNOSIS — I10 Essential (primary) hypertension: Secondary | ICD-10-CM | POA: Diagnosis not present

## 2017-08-10 DIAGNOSIS — R7303 Prediabetes: Secondary | ICD-10-CM | POA: Diagnosis not present

## 2017-08-10 DIAGNOSIS — C3491 Malignant neoplasm of unspecified part of right bronchus or lung: Secondary | ICD-10-CM | POA: Diagnosis not present

## 2017-08-12 DIAGNOSIS — J309 Allergic rhinitis, unspecified: Secondary | ICD-10-CM | POA: Diagnosis not present

## 2017-08-12 DIAGNOSIS — I10 Essential (primary) hypertension: Secondary | ICD-10-CM | POA: Diagnosis not present

## 2017-08-12 DIAGNOSIS — C3491 Malignant neoplasm of unspecified part of right bronchus or lung: Secondary | ICD-10-CM | POA: Diagnosis not present

## 2017-08-12 DIAGNOSIS — F419 Anxiety disorder, unspecified: Secondary | ICD-10-CM | POA: Diagnosis not present

## 2017-08-12 DIAGNOSIS — Z23 Encounter for immunization: Secondary | ICD-10-CM | POA: Diagnosis not present

## 2017-08-12 DIAGNOSIS — I480 Paroxysmal atrial fibrillation: Secondary | ICD-10-CM | POA: Diagnosis not present

## 2017-08-12 DIAGNOSIS — G2581 Restless legs syndrome: Secondary | ICD-10-CM | POA: Diagnosis not present

## 2017-08-12 DIAGNOSIS — Z7901 Long term (current) use of anticoagulants: Secondary | ICD-10-CM | POA: Diagnosis not present

## 2017-08-12 DIAGNOSIS — K219 Gastro-esophageal reflux disease without esophagitis: Secondary | ICD-10-CM | POA: Diagnosis not present

## 2017-08-12 DIAGNOSIS — E782 Mixed hyperlipidemia: Secondary | ICD-10-CM | POA: Diagnosis not present

## 2017-08-12 DIAGNOSIS — E669 Obesity, unspecified: Secondary | ICD-10-CM | POA: Diagnosis not present

## 2017-08-12 DIAGNOSIS — R7301 Impaired fasting glucose: Secondary | ICD-10-CM | POA: Diagnosis not present

## 2017-08-21 ENCOUNTER — Encounter: Payer: Self-pay | Admitting: Internal Medicine

## 2017-08-21 ENCOUNTER — Ambulatory Visit (INDEPENDENT_AMBULATORY_CARE_PROVIDER_SITE_OTHER): Payer: PPO | Admitting: Internal Medicine

## 2017-08-21 VITALS — BP 150/76 | HR 81 | Ht 62.5 in | Wt 199.6 lb

## 2017-08-21 DIAGNOSIS — I10 Essential (primary) hypertension: Secondary | ICD-10-CM | POA: Diagnosis not present

## 2017-08-21 DIAGNOSIS — I480 Paroxysmal atrial fibrillation: Secondary | ICD-10-CM

## 2017-08-21 NOTE — Progress Notes (Signed)
PCP: [name], MD  Primary EP: Dr Rayann Heman  Julie Gill is a 76 y.o. female who presents today for routine electrophysiology followup.  Since last being seen in our clinic, the patient reports doing very well.  She has very rare palpitations.  Today, she denies symptoms of chest pain, shortness of breath,  lower extremity edema, dizziness, presyncope, or syncope.  The patient is otherwise without complaint today.   Past Medical History:  Diagnosis Date  . Anxiety   . Arthritis   . Asthma    dx 10 yrs ago  . Atrial fibrillation (HCC)    CHADS VASC score 3. Intolerant to flecainide. s/p afib ablation 02/12/12  . Colon polyps   . Diastolic dysfunction   . Dysrhythmia    a fib   dx 2014  . GERD (gastroesophageal reflux disease)   . History of kidney infection     last one was approx 2009  . Hyperlipidemia   . Hypertension   . Mild sleep apnea    wears NO equipment  . Mitral regurgitation    mild  . Neuromuscular disorder ([place])   . Obesity   . Plantar fasciitis   . Restless leg syndrome   . Sinus bradycardia   . Sleep apnea   . Stress bladder incontinence, female    WEARS SMALL PADS   Past Surgical History:  Procedure Laterality Date  . ABDOMINAL HYSTERECTOMY  1992   BSO  . APPENDECTOMY    . atrial fibrillation ablation  02/12/12   PVI by Dr Rayann Heman  . ATRIAL FIBRILLATION ABLATION N/A 02/12/2012   Procedure: ATRIAL FIBRILLATION ABLATION;  Surgeon: Thompson Grayer, MD;  Location: [facility] CATH LAB;  Service: Cardiovascular;  Laterality: N/A;  . BLADDER SUSPENSION    . [address], [address],2004, 2007, 05/06/2011   adenomas  . heels spurs     x 2  . TEE WITHOUT CARDIOVERSION  02/11/2012   Procedure: TRANSESOPHAGEAL ECHOCARDIOGRAM (TEE);  Surgeon: Larey Dresser, MD;  Location: [place];  Service: Cardiovascular;  Laterality: N/A;  . TONSILLECTOMY AND ADENOIDECTOMY    . VARICOSE VEIN SURGERY    . VIDEO ASSISTED THORACOSCOPY (VATS)/WEDGE RESECTION Right  10/02/2016   Procedure: VIDEO ASSISTED THORACOSCOPY (VATS)/WEDGE RESECTION;  Surgeon: [name], MD;  Location: [place];  Service: Thoracic;  Laterality: Right;    ROS- all systems are reviewed and negatives except as per HPI above  Current Outpatient Prescriptions  Medication Sig Dispense Refill  . calcium carbonate (OS-CAL) 600 MG TABS Take 600 mg by mouth daily.     . Cholecalciferol (VITAMIN D PO) Take 4,000 Units by mouth daily.      . clonazePAM (KLONOPIN) 0.5 MG tablet Take 0.25 mg by mouth 2 (two) times daily as needed for anxiety.     . fish oil-omega-3 fatty acids 1000 MG capsule Take 1 g by mouth 2 (two) times daily.     . hydrochlorothiazide (HYDRODIURIL) 25 MG tablet TAKE 1/2 TABLET EVERY DAY 45 tablet 2  . lisinopril (PRINIVIL,ZESTRIL) 40 MG tablet Take 40 mg by mouth daily.    Marland Kitchen lovastatin (MEVACOR) 40 MG tablet Take 80 mg by mouth daily.     Marland Kitchen MAGNESIUM PO Take 250 mg by mouth daily.    . Multiple Vitamins-Minerals (MULTIVITAMIN WITH MINERALS) tablet Take 1 tablet by mouth daily.      . nitroGLYCERIN (NITROSTAT) 0.4 MG SL tablet Place 1 tablet (0.4 mg total) under the tongue every 5 (five)  minutes as needed for chest pain. 25 tablet 3  . pantoprazole (PROTONIX) 40 MG tablet Take 40 mg by mouth daily.    . potassium chloride SA (K-DUR,KLOR-CON) 20 MEQ tablet Take 20 mEq by mouth daily.    . pramipexole (MIRAPEX) 0.25 MG tablet Take 0.25 mg by mouth at bedtime.    . ranitidine (ZANTAC) 150 MG capsule Take 150 mg by mouth 2 (two) times daily.    Marland Kitchen senna (SENOKOT) 8.6 MG tablet Take 1 tablet by mouth daily.     . VENTOLIN HFA 108 (90 Base) MCG/ACT inhaler Inhale 2 puffs into the lungs every 6 (six) hours as needed for wheezing or shortness of breath.     . vitamin E (VITAMIN E) 400 UNIT capsule Take 400 Units by mouth daily.     Alveda Reasons 20 MG TABS tablet Take 20 mg by mouth daily with supper.      No current facility-administered medications for this visit.      Physical Exam: Vitals:   08/21/17 1456  BP: (!) 150/76  Pulse: 81  SpO2: 96%  Weight: 199 lb 9.6 oz (90.5 kg)  Height: 5' 2.5" (1.588 m)    GEN- The patient is well appearing, alert and oriented x 3 today.   Head- normocephalic, atraumatic Eyes-  Sclera clear, conjunctiva pink Ears- hearing intact Oropharynx- clear Lungs- Clear to ausculation bilaterally, normal work of breathing Heart- Regular rate and rhythm, no murmurs, rubs or gallops, PMI not laterally displaced GI- soft, NT, ND, + BS Extremities- no clubbing, cyanosis, or edema  EKG tracing ordered today is personally reviewed and shows sinus rhythm, normal ekg  Assessment and Plan:  1. afib Well controlled off AAD therapy Continue on xarelto  2. HTN Elevated today I have advised increased HCTZ She does not wish to make changes Lifestyle modification discussed at length  3. Obesity Body mass index is 35.93 kg/m. Weight loss is advised  Follow-up with Cecille Rubin in 6 months Return to see me in a year  Thompson Grayer MD, [facility] 08/21/2017 3:15 PM

## 2017-08-21 NOTE — Patient Instructions (Signed)
Medication Instructions:  Your physician recommends that you continue on your current medications as directed. Please refer to the Current Medication list given to you today.   Labwork: None ordered   Testing/Procedures: None ordered   Follow-Up: Your physician wants you to follow-up in: 6 months with [name], NP and 12 months with Dr Vallery Ridge will receive a reminder letter in the mail two months in advance. If you don't receive a letter, please call our office to schedule the follow-up appointment.   Any Other Special Instructions Will Be Listed Below (If Applicable).     If you need a refill on your cardiac medications before your next appointment, please call your pharmacy.

## 2017-08-31 DIAGNOSIS — H2513 Age-related nuclear cataract, bilateral: Secondary | ICD-10-CM | POA: Diagnosis not present

## 2017-09-15 DIAGNOSIS — G5601 Carpal tunnel syndrome, right upper limb: Secondary | ICD-10-CM | POA: Diagnosis not present

## 2017-09-22 DIAGNOSIS — H2513 Age-related nuclear cataract, bilateral: Secondary | ICD-10-CM | POA: Diagnosis not present

## 2017-09-22 DIAGNOSIS — H25043 Posterior subcapsular polar age-related cataract, bilateral: Secondary | ICD-10-CM | POA: Diagnosis not present

## 2017-09-22 DIAGNOSIS — H2511 Age-related nuclear cataract, right eye: Secondary | ICD-10-CM | POA: Diagnosis not present

## 2017-09-22 DIAGNOSIS — H02839 Dermatochalasis of unspecified eye, unspecified eyelid: Secondary | ICD-10-CM | POA: Diagnosis not present

## 2017-09-22 DIAGNOSIS — H25013 Cortical age-related cataract, bilateral: Secondary | ICD-10-CM | POA: Diagnosis not present

## 2017-09-28 ENCOUNTER — Encounter (HOSPITAL_COMMUNITY): Payer: Self-pay

## 2017-09-28 ENCOUNTER — Other Ambulatory Visit (HOSPITAL_BASED_OUTPATIENT_CLINIC_OR_DEPARTMENT_OTHER): Payer: PPO

## 2017-09-28 ENCOUNTER — Ambulatory Visit (HOSPITAL_COMMUNITY)
Admission: RE | Admit: 2017-09-28 | Discharge: 2017-09-28 | Disposition: A | Discharge status: Home / Self Care | Payer: PPO | Source: Ambulatory Visit | Attending: Internal Medicine | Admitting: Internal Medicine

## 2017-09-28 DIAGNOSIS — Z9889 Other specified postprocedural states: Secondary | ICD-10-CM | POA: Diagnosis not present

## 2017-09-28 DIAGNOSIS — C3491 Malignant neoplasm of unspecified part of right bronchus or lung: Secondary | ICD-10-CM | POA: Insufficient documentation

## 2017-09-28 DIAGNOSIS — I2510 Atherosclerotic heart disease of native coronary artery without angina pectoris: Secondary | ICD-10-CM | POA: Diagnosis not present

## 2017-09-28 DIAGNOSIS — I700 Atherosclerosis of aorta: Secondary | ICD-10-CM | POA: Diagnosis not present

## 2017-09-28 DIAGNOSIS — K449 Diaphragmatic hernia without obstruction or gangrene: Secondary | ICD-10-CM | POA: Insufficient documentation

## 2017-09-28 LAB — CBC WITH DIFFERENTIAL/PLATELET
BASO%: 0.2 % (ref 0.0–2.0)
Basophils Absolute: 0 10*3/uL (ref 0.0–0.1)
EOS%: 1.5 % (ref 0.0–7.0)
Eosinophils Absolute: 0.1 10*3/uL (ref 0.0–0.5)
HCT: 39 % (ref 34.8–46.6)
HGB: 12.5 g/dL (ref 11.6–15.9)
LYMPH%: 41.6 % (ref 14.0–49.7)
MCH: 29.5 pg (ref 25.1–34.0)
MCHC: 32.1 g/dL (ref 31.5–36.0)
MCV: 92 fL (ref 79.5–101.0)
MONO#: 0.3 10*3/uL (ref 0.1–0.9)
MONO%: 5.8 % (ref 0.0–14.0)
NEUT#: 2.8 10*3/uL (ref 1.5–6.5)
NEUT%: 50.9 % (ref 38.4–76.8)
Platelets: 234 10*3/uL (ref 145–400)
RBC: 4.24 10*6/uL (ref 3.70–5.45)
RDW: 13.2 % (ref 11.2–14.5)
WBC: 5.5 10*3/uL (ref 3.9–10.3)
lymph#: 2.3 10*3/uL (ref 0.9–3.3)

## 2017-09-28 LAB — COMPREHENSIVE METABOLIC PANEL
ALT: 24 U/L (ref 0–55)
AST: 24 U/L (ref 5–34)
Albumin: 4 g/dL (ref 3.5–5.0)
Alkaline Phosphatase: 87 U/L (ref 40–150)
Anion Gap: 9 mEq/L (ref 3–11)
BUN: 21.3 mg/dL (ref 7.0–26.0)
CO2: 28 mEq/L (ref 22–29)
Calcium: 9.9 mg/dL (ref 8.4–10.4)
Chloride: 105 mEq/L (ref 98–109)
Creatinine: 0.9 mg/dL (ref 0.6–1.1)
EGFR: >60 mL/min/{1.73_m2} (ref >60)
Glucose: 92 mg/dl (ref 70–140)
Potassium: 4.4 mEq/L (ref 3.5–5.1)
Sodium: 142 mEq/L (ref 136–145)
Total Bilirubin: 0.33 mg/dL (ref 0.20–1.20)
Total Protein: 6.9 g/dL (ref 6.4–8.3)

## 2017-09-28 MED ORDER — IOPAMIDOL (ISOVUE-300) INJECTION 61%
75.0000 mL | Freq: Once | INTRAVENOUS | Status: AC | PRN
  Administered 2017-09-28: 75 mL via INTRAVENOUS

## 2017-09-28 MED ORDER — IOPAMIDOL (ISOVUE-300) INJECTION 61%
INTRAVENOUS | Status: AC
  Filled 2017-09-28: qty 75

## 2017-09-29 ENCOUNTER — Ambulatory Visit: Payer: PPO | Admitting: Nurse Practitioner

## 2017-10-05 ENCOUNTER — Ambulatory Visit: Payer: PPO | Admitting: Internal Medicine

## 2017-10-05 ENCOUNTER — Encounter: Payer: Self-pay | Admitting: Internal Medicine

## 2017-10-05 ENCOUNTER — Telehealth: Payer: Self-pay | Admitting: Internal Medicine

## 2017-10-05 VITALS — BP 157/46 | HR 76 | Temp 98.0°F | Resp 18 | Ht 62.5 in | Wt 184.1 lb

## 2017-10-05 DIAGNOSIS — C3490 Malignant neoplasm of unspecified part of unspecified bronchus or lung: Secondary | ICD-10-CM

## 2017-10-05 DIAGNOSIS — C3491 Malignant neoplasm of unspecified part of right bronchus or lung: Secondary | ICD-10-CM | POA: Diagnosis not present

## 2017-10-05 NOTE — Progress Notes (Signed)
[facility] [phone]   [phone]  OFFICE PROGRESS NOTE  Julie Gill, [place] [address]  DIAGNOSIS: Stage Ib (T1c, N0, M0) non-small cell lung cancer, adenocarcinoma but the patient also has multifocal groundglass opacities in the lung bilaterally highly concerning for low-grade adenocarcinoma as well.  Genomic Alteration Identified? KRAS G12V Additional Findings? Microsatellite status MS-Stable Tumor Mutation Burden TMB-Intermediate; 11 Muts/Mb Additional Disease-relevant Genes with No Reportable Alterations Identified? EGFR ALK BRAF MET RET ERBB2 ROS1   PDL1 expression 5%.  PRIOR THERAPY: Status post wedge resection of the right upper lobe on 10/02/2016 under the care of Dr. Roxan Hockey.  CURRENT THERAPY: Observation.  INTERVAL HISTORY: DHRITHI RICHE 76 y.o. female returns to the clinic today for follow-up visit accompanied by her daughter and husband.  The patient is feeling fine today with no specific complaints.  She denied having any chest pain, shortness of breath, cough or hemoptysis.  She denied having any fever or chills.  She has no nausea, vomiting, diarrhea or constipation.  She denied having any recent weight loss or night sweats.  She had repeat CT scan of the chest performed recently and she is here for evaluation and discussion of her scan results.  MEDICAL HISTORY: Past Medical History:  Diagnosis Date  . Anxiety   . Arthritis   . Asthma    dx 10 yrs ago  . Atrial fibrillation (HCC)    CHADS VASC score 3. Intolerant to flecainide. s/p afib ablation 02/12/12  . Colon polyps   . Diastolic dysfunction   . Dysrhythmia    a fib   dx 2014  . GERD (gastroesophageal reflux disease)   . History of kidney infection     last one was approx 2009  . Hyperlipidemia   . Hypertension   . Mild sleep apnea    wears NO equipment  . Mitral regurgitation    mild  . Neuromuscular disorder  ([place])   . Obesity   . Plantar fasciitis   . Restless leg syndrome   . Sinus bradycardia   . Sleep apnea   . Stress bladder incontinence, female    WEARS SMALL PADS    ALLERGIES:  is allergic to codeine and prednisone.  MEDICATIONS:  Current Outpatient Medications  Medication Sig Dispense Refill  . calcium carbonate (OS-CAL) 600 MG TABS Take 600 mg by mouth daily.     . Cholecalciferol (VITAMIN D PO) Take 4,000 Units by mouth daily.      . clonazePAM (KLONOPIN) 0.5 MG tablet Take 0.25 mg by mouth 2 (two) times daily as needed for anxiety.     . DUREZOL 0.05 % EMUL     . fish oil-omega-3 fatty acids 1000 MG capsule Take 1 g by mouth 2 (two) times daily.     . hydrochlorothiazide (HYDRODIURIL) 25 MG tablet TAKE 1/2 TABLET EVERY DAY 45 tablet 2  . lisinopril (PRINIVIL,ZESTRIL) 40 MG tablet Take 40 mg by mouth daily.    Marland Kitchen lovastatin (MEVACOR) 40 MG tablet Take 80 mg by mouth daily.     Marland Kitchen MAGNESIUM PO Take 250 mg by mouth daily.    . Multiple Vitamins-Minerals (MULTIVITAMIN WITH MINERALS) tablet Take 1 tablet by mouth daily.      . nitroGLYCERIN (NITROSTAT) 0.4 MG SL tablet Place 1 tablet (0.4 mg total) under the tongue every 5 (five) minutes as needed for chest pain. 25 tablet 3  . pantoprazole (PROTONIX) 40 MG tablet  Take 40 mg by mouth daily.    . potassium chloride SA (K-DUR,KLOR-CON) 20 MEQ tablet Take 20 mEq by mouth daily.    . pramipexole (MIRAPEX) 0.25 MG tablet Take 0.25 mg by mouth at bedtime.    . ranitidine (ZANTAC) 150 MG capsule Take 150 mg by mouth 2 (two) times daily.    Marland Kitchen senna (SENOKOT) 8.6 MG tablet Take 1 tablet by mouth daily.     . traMADol (ULTRAM) 50 MG tablet     . VENTOLIN HFA 108 (90 Base) MCG/ACT inhaler Inhale 2 puffs into the lungs every 6 (six) hours as needed for wheezing or shortness of breath.     . vitamin E (VITAMIN E) 400 UNIT capsule Take 400 Units by mouth daily.     Alveda Reasons 20 MG TABS tablet Take 20 mg by mouth daily with supper.      No  current facility-administered medications for this visit.     SURGICAL HISTORY:  Past Surgical History:  Procedure Laterality Date  . ABDOMINAL HYSTERECTOMY  1992   BSO  . APPENDECTOMY    . atrial fibrillation ablation  02/12/12   PVI by Dr Rayann Heman  . BLADDER SUSPENSION    . [place], 200,2004, 2007, 05/06/2011   adenomas  . heels spurs     x 2  . TONSILLECTOMY AND ADENOIDECTOMY    . VARICOSE VEIN SURGERY      REVIEW OF SYSTEMS:  A comprehensive review of systems was negative.   PHYSICAL EXAMINATION: General appearance: alert, cooperative and no distress Head: Normocephalic, without obvious abnormality, atraumatic Neck: no adenopathy, no JVD, supple, symmetrical, trachea midline and thyroid not enlarged, symmetric, no tenderness/mass/nodules Lymph nodes: Cervical, supraclavicular, and axillary nodes normal. Resp: clear to auscultation bilaterally Back: symmetric, no curvature. ROM normal. No CVA tenderness. Cardio: regular rate and rhythm, S1, S2 normal, no murmur, click, rub or gallop GI: soft, non-tender; bowel sounds normal; no masses,  no organomegaly Extremities: extremities normal, atraumatic, no cyanosis or edema  ECOG PERFORMANCE STATUS: 1 - Symptomatic but completely ambulatory  Blood pressure (!) 157/46, pulse 76, temperature 98 F (36.7 C), temperature source Oral, resp. rate 18, height 5' 2.5" (1.588 m), weight 184 lb 1.6 oz (83.5 kg), SpO2 97 %.  LABORATORY DATA: Lab Results  Component Value Date   WBC 5.5 09/28/2017   HGB 12.5 09/28/2017   HCT 39.0 09/28/2017   MCV 92.0 09/28/2017   PLT 234 09/28/2017      Chemistry      Component Value Date/Time   NA 142 09/28/2017 0959   K 4.4 09/28/2017 0959   CL 101 02/07/2017 1220   CO2 28 09/28/2017 0959   BUN 21.3 09/28/2017 0959   CREATININE 0.9 09/28/2017 0959      Component Value Date/Time   CALCIUM 9.9 09/28/2017 0959   ALKPHOS 87 09/28/2017 0959   AST 24 09/28/2017 0959   ALT  24 09/28/2017 0959   BILITOT 0.33 09/28/2017 0959       RADIOGRAPHIC STUDIES: Ct Chest W Contrast  Result Date: 09/28/2017 CLINICAL DATA:  Right lung cancer. EXAM: CT CHEST WITH CONTRAST TECHNIQUE: Multidetector CT imaging of the chest was performed during intravenous contrast administration. CONTRAST:  16m ISOVUE-300 IOPAMIDOL (ISOVUE-300) INJECTION 61% COMPARISON:  Head 05/27/2017 and CT chest 05/07/2017. FINDINGS: Cardiovascular: Atherosclerotic calcification of the arterial vasculature, including moderate involvement of the coronary arteries. Heart is at the upper limits of normal in size. No pericardial effusion. Mediastinum/Nodes: Mediastinal lymph  nodes are not enlarged by CT size criteria. No hilar or axillary adenopathy. Esophagus is grossly unremarkable. Lungs/Pleura: Postoperative changes in the right upper lobe with soft tissue components and volume loss along the suture line, as before. Mild scarring in the right middle lobe and lingula. Ground-glass nodules are seen bilaterally, measuring up to 10 mm in the right lower lobe. No solid components. No pleural fluid. Airway is unremarkable. Upper Abdomen: 1.2 cm low-attenuation lesion left hepatic lobe is unchanged and likely a cyst. Visualized portions of the liver, gallbladder, adrenal gland, kidneys, spleen, pancreas, stomach and bowel are otherwise grossly unremarkable with exception of a small hiatal hernia. Upper abdominal lymph nodes are not enlarged by CT size criteria. Musculoskeletal: Degenerative changes are seen in the spine. IMPRESSION: 1. Postoperative changes in the right upper lobe with confluent soft tissue and volume loss along the suture margin, stable to minimally improved in appearance from 05/07/2017. 2. Scattered ground-glass nodules measure up to 10 mm, as before. No solid components. Continued observation on follow-up studies is recommended as low-grade adenocarcinoma cannot be excluded. 3. Aortic atherosclerosis  (ICD10-170.0). Moderate coronary artery calcification. Electronically Signed   By: Lorin Picket M.D.   On: 09/28/2017 13:47    ASSESSMENT AND PLAN:  This is a very pleasant 76 years old white female with stage IB non-small cell lung cancer, adenocarcinoma with no actionable mutations and several other groundglass opacities bilaterally. The patient is currently on observation.  Her recent CT scan of the chest showed no evidence for disease progression. I recommended to continue to observe the repeating of the chest months. She was advised to call immediately if she has any concerning symptoms in the interval. The patient and her daughter had several questions and I answered them completely to their satisfactions. She was advised to call immediately if she has any concerning symptoms in the interval. The patient voices understanding of current disease status and treatment options and is in agreement with the current care plan.  All questions were answered. The patient knows to call the clinic with any problems, questions or concerns. We can certainly see the patient much sooner if necessary.  Disclaimer: This note was dictated with voice recognition software. Similar sounding words can inadvertently be transcribed and may not be corrected upon review.

## 2017-10-05 NOTE — Telephone Encounter (Signed)
Scheduled appt per 11/5 los - Gave patient AVS and calender per los,- [facility] to contact patient with ct schedule.

## 2017-10-30 DIAGNOSIS — H2513 Age-related nuclear cataract, bilateral: Secondary | ICD-10-CM | POA: Diagnosis not present

## 2017-10-30 DIAGNOSIS — H2512 Age-related nuclear cataract, left eye: Secondary | ICD-10-CM | POA: Diagnosis not present

## 2017-10-30 DIAGNOSIS — H2511 Age-related nuclear cataract, right eye: Secondary | ICD-10-CM | POA: Diagnosis not present

## 2017-11-20 DIAGNOSIS — H2512 Age-related nuclear cataract, left eye: Secondary | ICD-10-CM | POA: Diagnosis not present

## 2017-11-20 DIAGNOSIS — H2513 Age-related nuclear cataract, bilateral: Secondary | ICD-10-CM | POA: Diagnosis not present

## 2017-12-07 DIAGNOSIS — S46812A Strain of other muscles, fascia and tendons at shoulder and upper arm level, left arm, initial encounter: Secondary | ICD-10-CM | POA: Diagnosis not present

## 2017-12-23 ENCOUNTER — Other Ambulatory Visit: Payer: Self-pay | Admitting: Family Medicine

## 2017-12-23 DIAGNOSIS — Z1231 Encounter for screening mammogram for malignant neoplasm of breast: Secondary | ICD-10-CM

## 2017-12-26 ENCOUNTER — Other Ambulatory Visit: Payer: Self-pay | Admitting: Nurse Practitioner

## 2018-01-04 DIAGNOSIS — M7672 Peroneal tendinitis, left leg: Secondary | ICD-10-CM | POA: Diagnosis not present

## 2018-01-19 ENCOUNTER — Ambulatory Visit
Admission: RE | Admit: 2018-01-19 | Discharge: 2018-01-19 | Disposition: A | Discharge status: Home / Self Care | Payer: PPO | Source: Ambulatory Visit | Attending: Family Medicine | Admitting: Family Medicine

## 2018-01-19 DIAGNOSIS — Z1231 Encounter for screening mammogram for malignant neoplasm of breast: Secondary | ICD-10-CM | POA: Diagnosis not present

## 2018-02-01 DIAGNOSIS — G4730 Sleep apnea, unspecified: Secondary | ICD-10-CM | POA: Diagnosis not present

## 2018-02-01 DIAGNOSIS — R7303 Prediabetes: Secondary | ICD-10-CM | POA: Diagnosis not present

## 2018-02-01 DIAGNOSIS — I10 Essential (primary) hypertension: Secondary | ICD-10-CM | POA: Diagnosis not present

## 2018-02-01 DIAGNOSIS — Z7901 Long term (current) use of anticoagulants: Secondary | ICD-10-CM | POA: Diagnosis not present

## 2018-02-01 DIAGNOSIS — I480 Paroxysmal atrial fibrillation: Secondary | ICD-10-CM | POA: Diagnosis not present

## 2018-02-01 DIAGNOSIS — J309 Allergic rhinitis, unspecified: Secondary | ICD-10-CM | POA: Diagnosis not present

## 2018-02-01 DIAGNOSIS — F324 Major depressive disorder, single episode, in partial remission: Secondary | ICD-10-CM | POA: Diagnosis not present

## 2018-02-01 DIAGNOSIS — G2581 Restless legs syndrome: Secondary | ICD-10-CM | POA: Diagnosis not present

## 2018-02-01 DIAGNOSIS — E782 Mixed hyperlipidemia: Secondary | ICD-10-CM | POA: Diagnosis not present

## 2018-02-01 DIAGNOSIS — K219 Gastro-esophageal reflux disease without esophagitis: Secondary | ICD-10-CM | POA: Diagnosis not present

## 2018-02-01 DIAGNOSIS — C3491 Malignant neoplasm of unspecified part of right bronchus or lung: Secondary | ICD-10-CM | POA: Diagnosis not present

## 2018-02-01 DIAGNOSIS — Z79899 Other long term (current) drug therapy: Secondary | ICD-10-CM | POA: Diagnosis not present

## 2018-02-01 DIAGNOSIS — Z0000 Encounter for general adult medical examination without abnormal findings: Secondary | ICD-10-CM | POA: Diagnosis not present

## 2018-02-03 DIAGNOSIS — N959 Unspecified menopausal and perimenopausal disorder: Secondary | ICD-10-CM | POA: Diagnosis not present

## 2018-02-03 DIAGNOSIS — R7301 Impaired fasting glucose: Secondary | ICD-10-CM | POA: Diagnosis not present

## 2018-02-03 DIAGNOSIS — I480 Paroxysmal atrial fibrillation: Secondary | ICD-10-CM | POA: Diagnosis not present

## 2018-02-03 DIAGNOSIS — J309 Allergic rhinitis, unspecified: Secondary | ICD-10-CM | POA: Diagnosis not present

## 2018-02-03 DIAGNOSIS — E782 Mixed hyperlipidemia: Secondary | ICD-10-CM | POA: Diagnosis not present

## 2018-02-03 DIAGNOSIS — I10 Essential (primary) hypertension: Secondary | ICD-10-CM | POA: Diagnosis not present

## 2018-02-03 DIAGNOSIS — E663 Overweight: Secondary | ICD-10-CM | POA: Diagnosis not present

## 2018-02-03 DIAGNOSIS — Z0000 Encounter for general adult medical examination without abnormal findings: Secondary | ICD-10-CM | POA: Diagnosis not present

## 2018-02-03 DIAGNOSIS — F419 Anxiety disorder, unspecified: Secondary | ICD-10-CM | POA: Diagnosis not present

## 2018-02-03 DIAGNOSIS — Z7901 Long term (current) use of anticoagulants: Secondary | ICD-10-CM | POA: Diagnosis not present

## 2018-02-22 ENCOUNTER — Encounter: Payer: Self-pay | Admitting: Nurse Practitioner

## 2018-02-22 ENCOUNTER — Ambulatory Visit: Payer: PPO | Admitting: Nurse Practitioner

## 2018-02-22 VITALS — BP 120/68 | HR 62 | Ht 62.0 in | Wt 170.0 lb

## 2018-02-22 DIAGNOSIS — I480 Paroxysmal atrial fibrillation: Secondary | ICD-10-CM | POA: Diagnosis not present

## 2018-02-22 NOTE — Patient Instructions (Addendum)
We will be checking the following labs today - NONE   Medication Instructions:    Continue with your current medicines.     Testing/Procedures To Be Arranged:  N/A  Follow-Up:   See Dr. Rayann Heman in September- 08/04/2018 at 3:00 PM  I will see you next April- 03/28/2019 at 3:30 PM    Other Special Instructions:   N/A    If you need a refill on your cardiac medications before your next appointment, please call your pharmacy.   Call the [facility] office at [phone] if you have any questions, problems or concerns.

## 2018-02-22 NOTE — Progress Notes (Signed)
CARDIOLOGY OFFICE NOTE  Date:  02/22/2018    Julie Gill Date of Birth: 01-May-1941 Medical Record #[id number]  PCP:  [name], MD  Cardiologist:  [facility]    Chief Complaint  Patient presents with  . Atrial Fibrillation    Follow up visit - seen for Dr. Rayann Heman    History of Present Illness: Julie Gill is a 77 y.o. female who presents today for a follow up visit.  Seen for Dr. Rayann Heman.   She has no known CAD. Does have PAF with prior ablation. Committed to long term anticoagulation - previously on Coumadin - now on Xarelto. Other issues include GERD, HLD, HTN, mild sleep apnea, bradycardia, restless legs, anxiety and obesity. She had a negative Myoview last December 2012. Last echo in 2013. Negative GXT in 2015. Labs are checked by primary care.   I last saw her back last April - she had been diagnosed with lung cancer. Had R VATS. Beta blocker stopped due to bradycardia during that admission. Pretty depressed on follow up.   Last seen by me back in April of 2018. Saw Dr. Rayann Heman in September of 2018.   Comes in today. Here alone. She has done quite well since I last saw her. She has lost 30 pounds. She is doing Weight Watcher's and going to the gym. Feels good. No chest pain. Breathing is fine. Not dizzy or lightheaded. BP is good. She has had labs with her PCP. She is quite pleased with how she is doing. Having repeat scans later this spring.   Past Medical History:  Diagnosis Date  . Anxiety   . Arthritis   . Asthma    dx 10 yrs ago  . Atrial fibrillation (HCC)    CHADS VASC score 3. Intolerant to flecainide. s/p afib ablation 02/12/12  . Colon polyps   . Diastolic dysfunction   . Dysrhythmia    a fib   dx 2014  . GERD (gastroesophageal reflux disease)   . History of kidney infection     last one was approx 2009  . Hyperlipidemia   . Hypertension   . Mild sleep apnea    wears NO equipment  . Mitral regurgitation    mild  . Neuromuscular  disorder ([place])   . Obesity   . Plantar fasciitis   . Restless leg syndrome   . Sinus bradycardia   . Sleep apnea   . Stress bladder incontinence, female    WEARS SMALL PADS    Past Surgical History:  Procedure Laterality Date  . ABDOMINAL HYSTERECTOMY  1992   BSO  . APPENDECTOMY    . atrial fibrillation ablation  02/12/12   PVI by Dr Rayann Heman  . ATRIAL FIBRILLATION ABLATION N/A 02/12/2012   Procedure: ATRIAL FIBRILLATION ABLATION;  Surgeon: Thompson Grayer, MD;  Location: [facility] CATH LAB;  Service: Cardiovascular;  Laterality: N/A;  . BLADDER SUSPENSION    . [place], 200,2004, 2007, 05/06/2011   adenomas  . heels spurs     x 2  . TEE WITHOUT CARDIOVERSION  02/11/2012   Procedure: TRANSESOPHAGEAL ECHOCARDIOGRAM (TEE);  Surgeon: Larey Dresser, MD;  Location: [facility];  Service: Cardiovascular;  Laterality: N/A;  . TONSILLECTOMY AND ADENOIDECTOMY    . VARICOSE VEIN SURGERY    . VIDEO ASSISTED THORACOSCOPY (VATS)/WEDGE RESECTION Right 10/02/2016   Procedure: VIDEO ASSISTED THORACOSCOPY (VATS)/WEDGE RESECTION;  Surgeon: [name], MD;  Location: [facility];  Service: Thoracic;  Laterality: Right;     Medications: Current Meds  Medication Sig  . calcium carbonate (OS-CAL) 600 MG TABS Take 600 mg by mouth daily.   . Cholecalciferol (VITAMIN D PO) Take 4,000 Units by mouth daily.    . clonazePAM (KLONOPIN) 0.5 MG tablet Take 0.25 mg by mouth 2 (two) times daily as needed for anxiety.   . DUREZOL 0.05 % EMUL   . fish oil-omega-3 fatty acids 1000 MG capsule Take 1 g by mouth 2 (two) times daily.   . hydrochlorothiazide (HYDRODIURIL) 25 MG tablet Take 1/2 tablet by mouth once daily  . lisinopril (PRINIVIL,ZESTRIL) 40 MG tablet Take 40 mg by mouth daily.  Marland Kitchen lovastatin (MEVACOR) 40 MG tablet Take 80 mg by mouth daily.   Marland Kitchen MAGNESIUM PO Take 250 mg by mouth daily.  . Multiple Vitamins-Minerals (MULTIVITAMIN WITH MINERALS) tablet Take 1 tablet by mouth daily.    .  nitroGLYCERIN (NITROSTAT) 0.4 MG SL tablet Place 1 tablet (0.4 mg total) under the tongue every 5 (five) minutes as needed for chest pain.  . pantoprazole (PROTONIX) 40 MG tablet Take 40 mg by mouth daily.  . potassium chloride SA (K-DUR,KLOR-CON) 20 MEQ tablet Take 20 mEq by mouth daily.  Marland Kitchen senna (SENOKOT) 8.6 MG tablet Take 1 tablet by mouth daily.   . VENTOLIN HFA 108 (90 Base) MCG/ACT inhaler Inhale 2 puffs into the lungs every 6 (six) hours as needed for wheezing or shortness of breath.   . vitamin E (VITAMIN E) 400 UNIT capsule Take 400 Units by mouth daily.   Alveda Reasons 20 MG TABS tablet Take 20 mg by mouth daily with supper.      Allergies: Allergies  Allergen Reactions  . Codeine Other (See Comments)    Skin crawls, jittery, agitated  . Prednisone Other (See Comments)    Tachycardia, light headed, rash    Social History: The patient  reports that she quit smoking about 32 years ago. Her smoking use included cigarettes. She has never used smokeless tobacco. She reports that she does not drink alcohol or use drugs.   Family History: The patient's family history includes Colon cancer in her maternal grandmother and maternal uncle; Congestive Heart Failure in her mother; Diabetes in her brother; Hypertension in her brother; Lung cancer in her father.   Review of Systems: Please see the history of present illness.   Otherwise, the review of systems is positive for none.   All other systems are reviewed and negative.   Physical Exam: VS:  BP 120/68 (BP Location: Left Arm, Patient Position: Sitting)   Pulse 62   Ht 5\' 2"  (1.575 m)   Wt 170 lb (77.1 kg)   SpO2 97%   BMI 31.09 kg/m  .  BMI Body mass index is 31.09 kg/m.  Wt Readings from Last 3 Encounters:  02/22/18 170 lb (77.1 kg)  10/05/17 184 lb 1.6 oz (83.5 kg)  08/21/17 199 lb 9.6 oz (90.5 kg)    General: Pleasant. Well developed, well nourished and in no acute distress.  She has lost 29 pounds since last visit here.    HEENT: Normal.  Neck: Supple, no JVD, carotid bruits, or masses noted.  Cardiac: Regular rate and rhythm. No murmurs, rubs, or gallops. No edema.  Respiratory:  Lungs are clear to auscultation bilaterally with normal work of breathing.  GI: Soft and nontender.  MS: No deformity or atrophy. Gait and ROM intact.  Skin: Warm and dry. Color is normal.  Neuro:  Strength and sensation are intact and no gross focal deficits noted.  Psych: Alert, appropriate and with normal affect.   LABORATORY DATA:  EKG:  EKG is ordered today. This demonstrates NSR.  Lab Results  Component Value Date   WBC 5.5 09/28/2017   HGB 12.5 09/28/2017   HCT 39.0 09/28/2017   PLT 234 09/28/2017   GLUCOSE 92 09/28/2017   ALT 24 09/28/2017   AST 24 09/28/2017   NA 142 09/28/2017   K 4.4 09/28/2017   CL 101 02/07/2017   CREATININE 0.9 09/28/2017   BUN 21.3 09/28/2017   CO2 28 09/28/2017   INR 1.25 09/29/2016       BNP (last 3 results) No results for input(s): BNP in the last 8760 hours.  ProBNP (last 3 results) No results for input(s): PROBNP in the last 8760 hours.   Other Studies Reviewed Today:  Echo Study Conclusions from 05/2016  - Left ventricle: The cavity size was normal. There was moderate focal basal hypertrophy. Systolic function was normal. The estimated ejection fraction was in the range of 55% to 60%. Wall motion was normal; there were no regional wall motion abnormalities. The transmitral flow pattern was normal. Left ventricular diastolic function parameters were normal. - Mitral valve: Calcified annulus. - Right ventricle: The cavity size was mildly dilated. Wall thickness was normal.  GXT Comments from May 2015: Patient presents today for routine GXT. Has had chest pain - no known CAD. Has HTN, HLD and obesity. She held 2 doses of her beta blocker prior to this study.  Today the patient exercised on the standard Bruce protocol for a total of 6:30 minutes.   Reduced exercise tolerance.  Adequate blood pressure response.  Clinically negative for chest pain. Test was stopped due to fatigue/reported very mild chest pain that resolved quickly in recovery.  EKG negative for ischemia. Noted to have occasional PACs and PVCs. More PVC's noted at peak of exercise.    Assessment/Plan:   1. PAF - she is intolerant to flecainide and has had prior ablation - she did have recurrence with her lung surgery - not surprising - no longer on beta blocker due to symptomatic bradycardia - currently without recurrence. Remains in sinus by exam today and by EKG. She is doing just great. No changes made today.   2. HTN - BP ok on her current regimen. Encouraged her to let us know if her readings get lower or if she gets symptomatic with lower readings - may need to start cutting her antihypertensives back. She will continue to monitor.  3. Chronic anticoagulation - now on Xarelto - no problems noted. Labs from her PCP from earlier this month noted.   4. Obesity - she has been quite successful with Weight Watcher's - she is Advertising account executive.   5. Prior VATS for lung cancer - followed by oncology  6. Depression - she seems much better today. Does not appear to really not an issue at this time.   Current medicines are reviewed with the patient today.  The patient does not have concerns regarding medicines other than what has been noted above.  The following changes have been made:  See above.  Labs/ tests ordered today include:    Orders Placed This Encounter  Procedures  . EKG 12-Lead     Disposition:   FU with Dr. Rayann Heman in September as planned - I will see in one year.   Patient is agreeable to this plan and will call if any  problems develop in the interim.   SignedTruitt Merle, NP  02/22/2018 3:39 PM  [facility] [address] [place], [place]  [address] Phone: [phone] Fax: [phone]

## 2018-03-24 ENCOUNTER — Telehealth: Payer: Self-pay

## 2018-03-24 NOTE — Telephone Encounter (Signed)
Returned patient call concerning appointment for CT. Was unable to give her that number . Will instead call [facility] for her to have them give her a call to set up appointment date and time. Per 4/24 phone message

## 2018-03-24 NOTE — Telephone Encounter (Signed)
Appointment has been made for patient ct at 10:30 am per 4/24 phone que.

## 2018-04-05 ENCOUNTER — Encounter (HOSPITAL_COMMUNITY): Payer: Self-pay

## 2018-04-05 ENCOUNTER — Inpatient Hospital Stay: Payer: PPO | Attending: Internal Medicine

## 2018-04-05 ENCOUNTER — Ambulatory Visit (HOSPITAL_COMMUNITY)
Admission: RE | Admit: 2018-04-05 | Discharge: 2018-04-05 | Disposition: A | Discharge status: Home / Self Care | Payer: PPO | Source: Ambulatory Visit | Attending: Internal Medicine | Admitting: Internal Medicine

## 2018-04-05 DIAGNOSIS — M1990 Unspecified osteoarthritis, unspecified site: Secondary | ICD-10-CM | POA: Diagnosis not present

## 2018-04-05 DIAGNOSIS — K219 Gastro-esophageal reflux disease without esophagitis: Secondary | ICD-10-CM | POA: Insufficient documentation

## 2018-04-05 DIAGNOSIS — R918 Other nonspecific abnormal finding of lung field: Secondary | ICD-10-CM | POA: Diagnosis not present

## 2018-04-05 DIAGNOSIS — I700 Atherosclerosis of aorta: Secondary | ICD-10-CM | POA: Insufficient documentation

## 2018-04-05 DIAGNOSIS — Z7901 Long term (current) use of anticoagulants: Secondary | ICD-10-CM | POA: Diagnosis not present

## 2018-04-05 DIAGNOSIS — I10 Essential (primary) hypertension: Secondary | ICD-10-CM | POA: Insufficient documentation

## 2018-04-05 DIAGNOSIS — C3490 Malignant neoplasm of unspecified part of unspecified bronchus or lung: Secondary | ICD-10-CM

## 2018-04-05 DIAGNOSIS — Z79899 Other long term (current) drug therapy: Secondary | ICD-10-CM | POA: Diagnosis not present

## 2018-04-05 DIAGNOSIS — I4891 Unspecified atrial fibrillation: Secondary | ICD-10-CM | POA: Diagnosis not present

## 2018-04-05 DIAGNOSIS — C3411 Malignant neoplasm of upper lobe, right bronchus or lung: Secondary | ICD-10-CM | POA: Diagnosis not present

## 2018-04-05 DIAGNOSIS — K449 Diaphragmatic hernia without obstruction or gangrene: Secondary | ICD-10-CM | POA: Insufficient documentation

## 2018-04-05 DIAGNOSIS — Z87440 Personal history of urinary (tract) infections: Secondary | ICD-10-CM | POA: Insufficient documentation

## 2018-04-05 DIAGNOSIS — J479 Bronchiectasis, uncomplicated: Secondary | ICD-10-CM | POA: Diagnosis not present

## 2018-04-05 DIAGNOSIS — I2510 Atherosclerotic heart disease of native coronary artery without angina pectoris: Secondary | ICD-10-CM | POA: Insufficient documentation

## 2018-04-05 DIAGNOSIS — E785 Hyperlipidemia, unspecified: Secondary | ICD-10-CM | POA: Diagnosis not present

## 2018-04-05 LAB — COMPREHENSIVE METABOLIC PANEL
ALT: 22 U/L (ref 0–55)
AST: 24 U/L (ref 5–34)
Albumin: 3.9 g/dL (ref 3.5–5.0)
Alkaline Phosphatase: 98 U/L (ref 40–150)
Anion gap: 6 (ref 3–11)
BUN: 17 mg/dL (ref 7–26)
CO2: 29 mmol/L (ref 22–29)
Calcium: 9.7 mg/dL (ref 8.4–10.4)
Chloride: 107 mmol/L (ref 98–109)
Creatinine, Ser: 0.78 mg/dL (ref 0.60–1.10)
GFR calc Af Amer: >60 mL/min (ref >60)
GFR calc non Af Amer: >60 mL/min (ref >60)
Glucose, Bld: 84 mg/dL (ref 70–140)
Potassium: 4.1 mmol/L (ref 3.5–5.1)
Sodium: 142 mmol/L (ref 136–145)
Total Bilirubin: 0.3 mg/dL (ref 0.2–1.2)
Total Protein: 6.7 g/dL (ref 6.4–8.3)

## 2018-04-05 LAB — CBC WITH DIFFERENTIAL/PLATELET
Basophils Absolute: 0 10*3/uL (ref 0.0–0.1)
Basophils Relative: 0 %
Eosinophils Absolute: 0.1 10*3/uL (ref 0.0–0.5)
Eosinophils Relative: 1 %
HCT: 37.7 % (ref 34.8–46.6)
Hemoglobin: 12.6 g/dL (ref 11.6–15.9)
Lymphocytes Relative: 31 %
Lymphs Abs: 1.8 10*3/uL (ref 0.9–3.3)
MCH: 29.9 pg (ref 25.1–34.0)
MCHC: 33.3 g/dL (ref 31.5–36.0)
MCV: 89.9 fL (ref 79.5–101.0)
Monocytes Absolute: 0.4 10*3/uL (ref 0.1–0.9)
Monocytes Relative: 7 %
Neutro Abs: 3.6 10*3/uL (ref 1.5–6.5)
Neutrophils Relative %: 61 %
Platelets: 218 10*3/uL (ref 145–400)
RBC: 4.2 MIL/uL (ref 3.70–5.45)
RDW: 13.6 % (ref 11.2–14.5)
WBC: 5.8 10*3/uL (ref 3.9–10.3)

## 2018-04-05 MED ORDER — IOHEXOL 300 MG/ML  SOLN
75.0000 mL | Freq: Once | INTRAMUSCULAR | Status: AC | PRN
  Administered 2018-04-05: 75 mL via INTRAVENOUS

## 2018-04-12 ENCOUNTER — Telehealth: Payer: Self-pay | Admitting: Internal Medicine

## 2018-04-12 ENCOUNTER — Inpatient Hospital Stay (HOSPITAL_BASED_OUTPATIENT_CLINIC_OR_DEPARTMENT_OTHER): Payer: PPO | Admitting: Internal Medicine

## 2018-04-12 ENCOUNTER — Encounter: Payer: Self-pay | Admitting: Internal Medicine

## 2018-04-12 VITALS — BP 133/58 | HR 58 | Temp 98.2°F | Resp 18 | Ht 62.0 in | Wt 165.3 lb

## 2018-04-12 DIAGNOSIS — M1990 Unspecified osteoarthritis, unspecified site: Secondary | ICD-10-CM | POA: Diagnosis not present

## 2018-04-12 DIAGNOSIS — R918 Other nonspecific abnormal finding of lung field: Secondary | ICD-10-CM | POA: Diagnosis not present

## 2018-04-12 DIAGNOSIS — C3411 Malignant neoplasm of upper lobe, right bronchus or lung: Secondary | ICD-10-CM

## 2018-04-12 DIAGNOSIS — E785 Hyperlipidemia, unspecified: Secondary | ICD-10-CM | POA: Diagnosis not present

## 2018-04-12 DIAGNOSIS — Z87440 Personal history of urinary (tract) infections: Secondary | ICD-10-CM

## 2018-04-12 DIAGNOSIS — K219 Gastro-esophageal reflux disease without esophagitis: Secondary | ICD-10-CM | POA: Diagnosis not present

## 2018-04-12 DIAGNOSIS — C3491 Malignant neoplasm of unspecified part of right bronchus or lung: Secondary | ICD-10-CM

## 2018-04-12 DIAGNOSIS — I4891 Unspecified atrial fibrillation: Secondary | ICD-10-CM

## 2018-04-12 DIAGNOSIS — Z79899 Other long term (current) drug therapy: Secondary | ICD-10-CM | POA: Diagnosis not present

## 2018-04-12 DIAGNOSIS — Z7901 Long term (current) use of anticoagulants: Secondary | ICD-10-CM | POA: Diagnosis not present

## 2018-04-12 DIAGNOSIS — I10 Essential (primary) hypertension: Secondary | ICD-10-CM | POA: Diagnosis not present

## 2018-04-12 DIAGNOSIS — C3490 Malignant neoplasm of unspecified part of unspecified bronchus or lung: Secondary | ICD-10-CM

## 2018-04-12 NOTE — Telephone Encounter (Signed)
Scheduled appt per 5/13 los - gave patient aVS and calender per los. [facility] to contact patient with scan.

## 2018-04-12 NOTE — Progress Notes (Signed)
[place] Telephone:(336) [phone]   Fax:(336) [phone]  OFFICE PROGRESS NOTE  [name], [place] [address]  DIAGNOSIS: Stage Ib (T1c, N0, M0) non-small cell lung cancer, adenocarcinoma but the patient also has multifocal groundglass opacities in the lung bilaterally highly concerning for low-grade adenocarcinoma as well.  Genomic Alteration Identified? KRAS G12V Additional Findings? Microsatellite status MS-Stable Tumor Mutation Burden TMB-Intermediate; 11 Muts/Mb Additional Disease-relevant Genes with No Reportable Alterations Identified? EGFR ALK BRAF MET RET ERBB2 ROS1   PDL1 expression 5%.  PRIOR THERAPY: Status post wedge resection of the right upper lobe on 10/02/2016 under the care of Dr. Roxan Hockey.  CURRENT THERAPY: Observation.  INTERVAL HISTORY: Julie Gill 77 y.o. female returns to the clinic today for six-month follow-up visit accompanied by her husband and daughter.  The patient is feeling fine today with no specific complaints.  She intentionally lost 35 pounds in the last 6 months with weight watcher.  She denied having any chest pain, shortness of breath, cough or hemoptysis.  The patient has no nausea, vomiting, diarrhea or constipation.  She had repeat CT scan of the chest, abdomen and pelvis performed recently and she is here for evaluation and discussion of her risk her results.  MEDICAL HISTORY: Past Medical History:  Diagnosis Date  . Anxiety   . Arthritis   . Asthma    dx 10 yrs ago  . Atrial fibrillation (HCC)    CHADS VASC score 3. Intolerant to flecainide. s/p afib ablation 02/12/12  . Colon polyps   . Diastolic dysfunction   . Dysrhythmia    a fib   dx 2014  . GERD (gastroesophageal reflux disease)   . History of kidney infection     last one was approx 2009  . Hyperlipidemia   . Hypertension   . Mild sleep apnea    wears NO equipment  . Mitral regurgitation    mild  .  Neuromuscular disorder ([place])   . Obesity   . Plantar fasciitis   . Restless leg syndrome   . Sinus bradycardia   . Sleep apnea   . Stress bladder incontinence, female    WEARS SMALL PADS    ALLERGIES:  is allergic to codeine and prednisone.  MEDICATIONS:  Current Outpatient Medications  Medication Sig Dispense Refill  . calcium carbonate (OS-CAL) 600 MG TABS Take 600 mg by mouth daily.     . Cholecalciferol (VITAMIN D PO) Take 4,000 Units by mouth daily.      . clonazePAM (KLONOPIN) 0.5 MG tablet Take 0.25 mg by mouth 2 (two) times daily as needed for anxiety.     . fish oil-omega-3 fatty acids 1000 MG capsule Take 1 g by mouth 2 (two) times daily.     . hydrochlorothiazide (HYDRODIURIL) 25 MG tablet Take 1/2 tablet by mouth once daily 45 tablet 3  . lisinopril (PRINIVIL,ZESTRIL) 40 MG tablet Take 40 mg by mouth daily.    Marland Kitchen lovastatin (MEVACOR) 40 MG tablet Take 80 mg by mouth daily.     Marland Kitchen MAGNESIUM PO Take 250 mg by mouth daily.    . Multiple Vitamins-Minerals (MULTIVITAMIN WITH MINERALS) tablet Take 1 tablet by mouth daily.      . pantoprazole (PROTONIX) 40 MG tablet Take 40 mg by mouth daily.    . potassium chloride SA (K-DUR,KLOR-CON) 20 MEQ tablet Take 20 mEq by mouth daily.    Marland Kitchen senna (SENOKOT) 8.6 MG tablet Take 1 tablet  by mouth daily.     . vitamin E (VITAMIN E) 400 UNIT capsule Take 400 Units by mouth daily.     Alveda Reasons 20 MG TABS tablet Take 20 mg by mouth daily with supper.     . nitroGLYCERIN (NITROSTAT) 0.4 MG SL tablet Place 1 tablet (0.4 mg total) under the tongue every 5 (five) minutes as needed for chest pain. (Patient not taking: Reported on 04/12/2018) 25 tablet 3  . VENTOLIN HFA 108 (90 Base) MCG/ACT inhaler Inhale 2 puffs into the lungs every 6 (six) hours as needed for wheezing or shortness of breath.      No current facility-administered medications for this visit.     SURGICAL HISTORY:  Past Surgical History:  Procedure Laterality Date  . ABDOMINAL  HYSTERECTOMY  1992   BSO  . APPENDECTOMY    . atrial fibrillation ablation  02/12/12   PVI by Dr Rayann Heman  . ATRIAL FIBRILLATION ABLATION N/A 02/12/2012   Procedure: ATRIAL FIBRILLATION ABLATION;  Surgeon: Thompson Grayer, MD;  Location: [facility] CATH LAB;  Service: Cardiovascular;  Laterality: N/A;  . BLADDER SUSPENSION    . [place], 200,2004, 2007, 05/06/2011   adenomas  . heels spurs     x 2  . TEE WITHOUT CARDIOVERSION  02/11/2012   Procedure: TRANSESOPHAGEAL ECHOCARDIOGRAM (TEE);  Surgeon: Larey Dresser, MD;  Location: [place];  Service: Cardiovascular;  Laterality: N/A;  . TONSILLECTOMY AND ADENOIDECTOMY    . VARICOSE VEIN SURGERY    . VIDEO ASSISTED THORACOSCOPY (VATS)/WEDGE RESECTION Right 10/02/2016   Procedure: VIDEO ASSISTED THORACOSCOPY (VATS)/WEDGE RESECTION;  Surgeon: [name], MD;  Location: [place];  Service: Thoracic;  Laterality: Right;    REVIEW OF SYSTEMS:  A comprehensive review of systems was negative.   PHYSICAL EXAMINATION: General appearance: alert, cooperative and no distress Head: Normocephalic, without obvious abnormality, atraumatic Neck: no adenopathy, no JVD, supple, symmetrical, trachea midline and thyroid not enlarged, symmetric, no tenderness/mass/nodules Lymph nodes: Cervical, supraclavicular, and axillary nodes normal. Resp: clear to auscultation bilaterally Back: symmetric, no curvature. ROM normal. No CVA tenderness. Cardio: regular rate and rhythm, S1, S2 normal, no murmur, click, rub or gallop GI: soft, non-tender; bowel sounds normal; no masses,  no organomegaly Extremities: extremities normal, atraumatic, no cyanosis or edema  ECOG PERFORMANCE STATUS: 1 - Symptomatic but completely ambulatory  Blood pressure (!) 133/58, pulse (!) 58, temperature 98.2 F (36.8 C), temperature source Oral, resp. rate 18, height _0  (1.575 m), weight 165 lb 4.8 oz (75 kg), SpO2 97 %.  LABORATORY DATA: Lab Results  Component Value  Date   WBC 5.8 04/05/2018   HGB 12.6 04/05/2018   HCT 37.7 04/05/2018   MCV 89.9 04/05/2018   PLT 218 04/05/2018      Chemistry      Component Value Date/Time   NA 142 04/05/2018 0953   NA 142 09/28/2017 0959   K 4.1 04/05/2018 0953   K 4.4 09/28/2017 0959   CL 107 04/05/2018 0953   CO2 29 04/05/2018 0953   CO2 28 09/28/2017 0959   BUN 17 04/05/2018 0953   BUN 21.3 09/28/2017 0959   CREATININE 0.78 04/05/2018 0953   CREATININE 0.9 09/28/2017 0959      Component Value Date/Time   CALCIUM 9.7 04/05/2018 0953   CALCIUM 9.9 09/28/2017 0959   ALKPHOS 98 04/05/2018 0953   ALKPHOS 87 09/28/2017 0959   AST 24 04/05/2018 0953   AST 24 09/28/2017 0959   ALT  22 04/05/2018 0953   ALT 24 09/28/2017 0959   BILITOT 0.3 04/05/2018 0953   BILITOT 0.33 09/28/2017 0959       RADIOGRAPHIC STUDIES: Ct Chest W Contrast  Result Date: 04/05/2018 CLINICAL DATA:  Status post right upper lobe wedge resection 10/02/2016 for lung adenocarcinoma. Restaging. EXAM: CT CHEST WITH CONTRAST TECHNIQUE: Multidetector CT imaging of the chest was performed during intravenous contrast administration. CONTRAST:  75m OMNIPAQUE IOHEXOL 300 MG/ML  SOLN COMPARISON:  09/28/2017 chest CT.  05/27/2017 PET-CT. FINDINGS: Cardiovascular: Normal heart size. No significant pericardial fluid/thickening. Left anterior descending coronary atherosclerosis. Atherosclerotic nonaneurysmal thoracic aorta. Normal caliber pulmonary arteries. No central pulmonary emboli. Mediastinum/Nodes: No discrete thyroid nodules. Unremarkable esophagus. No pathologically enlarged axillary, mediastinal or hilar lymph nodes. Lungs/Pleura: No pneumothorax. No pleural effusion. Soft tissue density along the right upper lobe wedge resection suture line measures 2.2 x 2.0 cm (series 7/image 23), previously 2.3 x 2.2 cm, slightly decreased. No acute consolidative airspace disease or lung masses. Several small ground-glass pulmonary nodules scattered in both  lungs measuring up to 1.1 cm in the right lower lobe (series 7/image 81), previously 1.1 cm using similar measurement technique, stable. Small subpleural left lower lobe pulmonary nodules, largest 4 mm (series 7/image 96), both stable since 10/11/2016 chest CT, considered benign. No new significant pulmonary nodules. Patchy tree-in-bud opacities with associated minimal bronchiectasis in the right middle lobe and lingula has progressively slowly worsened over multiple prior chest CT studies. Upper abdomen: Small hiatal hernia. Simple 1.1 cm lateral segment left liver lobe cyst. Musculoskeletal: No aggressive appearing focal osseous lesions. Marked thoracic spondylosis. IMPRESSION: 1. Soft tissue density along the right upper lobe wedge resection suture line is slightly decreased, more suggestive of evolving postsurgical scarring. Continued chest CT surveillance advised. 2. Scattered small ground-glass pulmonary nodules in both lungs, largest 1.1 cm in the right lower lobe, all stable. Indolent multicentric adenocarcinoma not excluded. Continued chest CT surveillance advised. 3. Patchy tree-in-bud opacities with associated minimal bronchiectasis in the right middle lobe and lingula, progressively slowly worsened over multiple prior chest CT studies, suggestive of atypical mycobacterial infection (MAI). 4. No thoracic adenopathy. 5. One vessel coronary atherosclerosis. 6. Small hiatal hernia. Aortic Atherosclerosis (ICD10-I70.0). Electronically Signed   By: JIlona SorrelM.D.   On: 04/05/2018 16:48    ASSESSMENT AND PLAN:  This is a very pleasant 77years old white female with stage IB non-small cell lung cancer, adenocarcinoma with no actionable mutations and several other groundglass opacities bilaterally. She is current on observation and she is feeling fine. The recent CT scan of the chest showed no concerning findings for disease progression. I discussed the scan results with the patient and her family and  recommended for her to continue in observation with repeat CT scan of the chest in 6 months. She was advised to call immediately if she has any concerning symptoms in the interval. The patient voices understanding of current disease status and treatment options and is in agreement with the current care plan.  All questions were answered. The patient knows to call the clinic with any problems, questions or concerns. We can certainly see the patient much sooner if necessary.  Disclaimer: This note was dictated with voice recognition software. Similar sounding words can inadvertently be transcribed and may not be corrected upon review.

## 2018-05-17 DIAGNOSIS — E2839 Other primary ovarian failure: Secondary | ICD-10-CM | POA: Diagnosis not present

## 2018-08-04 ENCOUNTER — Ambulatory Visit: Payer: PPO | Admitting: Internal Medicine

## 2018-08-04 ENCOUNTER — Encounter: Payer: Self-pay | Admitting: Internal Medicine

## 2018-08-04 VITALS — BP 120/62 | HR 62 | Ht 61.0 in | Wt 162.6 lb

## 2018-08-04 DIAGNOSIS — I480 Paroxysmal atrial fibrillation: Secondary | ICD-10-CM

## 2018-08-04 DIAGNOSIS — I10 Essential (primary) hypertension: Secondary | ICD-10-CM | POA: Diagnosis not present

## 2018-08-04 NOTE — Patient Instructions (Signed)

## 2018-08-04 NOTE — Progress Notes (Signed)
PCP: [name], MD   Primary EP: Dr Rayann Heman  Julie Gill is a 77 y.o. female who presents today for routine electrophysiology followup.  Since last being seen in our clinic, the patient reports doing very well.  No afib since her last visit.  Today, she denies symptoms of palpitations, chest pain, shortness of breath,  lower extremity edema, dizziness, presyncope, or syncope.  The patient is otherwise without complaint today.   Past Medical History:  Diagnosis Date  . Anxiety   . Arthritis   . Asthma    dx 10 yrs ago  . Atrial fibrillation (HCC)    CHADS VASC score 3. Intolerant to flecainide. s/p afib ablation 02/12/12  . Colon polyps   . Diastolic dysfunction   . Dysrhythmia    a fib   dx 2014  . GERD (gastroesophageal reflux disease)   . History of kidney infection     last one was approx 2009  . Hyperlipidemia   . Hypertension   . Mild sleep apnea    wears NO equipment  . Mitral regurgitation    mild  . Neuromuscular disorder ([place])   . Obesity   . Plantar fasciitis   . Restless leg syndrome   . Sinus bradycardia   . Sleep apnea   . Stress bladder incontinence, female    WEARS SMALL PADS   Past Surgical History:  Procedure Laterality Date  . ABDOMINAL HYSTERECTOMY  1992   BSO  . APPENDECTOMY    . atrial fibrillation ablation  02/12/12   PVI by Dr Rayann Heman  . ATRIAL FIBRILLATION ABLATION N/A 02/12/2012   Procedure: ATRIAL FIBRILLATION ABLATION;  Surgeon: Thompson Grayer, MD;  Location: [facility] CATH LAB;  Service: Cardiovascular;  Laterality: N/A;  . BLADDER SUSPENSION    . [place], 200,2004, 2007, 05/06/2011   adenomas  . heels spurs     x 2  . TEE WITHOUT CARDIOVERSION  02/11/2012   Procedure: TRANSESOPHAGEAL ECHOCARDIOGRAM (TEE);  Surgeon: Larey Dresser, MD;  Location: [place];  Service: Cardiovascular;  Laterality: N/A;  . TONSILLECTOMY AND ADENOIDECTOMY    . VARICOSE VEIN SURGERY    . VIDEO ASSISTED THORACOSCOPY (VATS)/WEDGE  RESECTION Right 10/02/2016   Procedure: VIDEO ASSISTED THORACOSCOPY (VATS)/WEDGE RESECTION;  Surgeon: [name], MD;  Location: [place];  Service: Thoracic;  Laterality: Right;    ROS- all systems are reviewed and negatives except as per HPI above  Current Outpatient Medications  Medication Sig Dispense Refill  . calcium carbonate (OS-CAL) 600 MG TABS Take 600 mg by mouth daily.     . Cholecalciferol (VITAMIN D PO) Take 4,000 Units by mouth daily.      . clonazePAM (KLONOPIN) 0.5 MG tablet Take 0.25 mg by mouth 2 (two) times daily as needed for anxiety.     . fish oil-omega-3 fatty acids 1000 MG capsule Take 1 g by mouth 2 (two) times daily.     . hydrochlorothiazide (HYDRODIURIL) 25 MG tablet Take 1/2 tablet by mouth once daily 45 tablet 3  . lisinopril (PRINIVIL,ZESTRIL) 40 MG tablet Take 40 mg by mouth daily.    Marland Kitchen lovastatin (MEVACOR) 40 MG tablet Take 80 mg by mouth daily.     Marland Kitchen MAGNESIUM PO Take 250 mg by mouth daily.    . Multiple Vitamins-Minerals (MULTIVITAMIN WITH MINERALS) tablet Take 1 tablet by mouth daily.      . nitroGLYCERIN (NITROSTAT) 0.4 MG SL tablet Place 1 tablet (0.4 mg total) under  the tongue every 5 (five) minutes as needed for chest pain. 25 tablet 3  . pantoprazole (PROTONIX) 40 MG tablet Take 40 mg by mouth daily.    . potassium chloride SA (K-DUR,KLOR-CON) 20 MEQ tablet Take 20 mEq by mouth daily.    Marland Kitchen senna (SENOKOT) 8.6 MG tablet Take 1 tablet by mouth daily.     . VENTOLIN HFA 108 (90 Base) MCG/ACT inhaler Inhale 2 puffs into the lungs every 6 (six) hours as needed for wheezing or shortness of breath.     . vitamin E (VITAMIN E) 400 UNIT capsule Take 400 Units by mouth daily.     Alveda Reasons 20 MG TABS tablet Take 20 mg by mouth daily with supper.      No current facility-administered medications for this visit.     Physical Exam: Vitals:   08/04/18 1509  BP: 120/62  Pulse: 62  SpO2: 97%  Weight: 162 lb 9.6 oz (73.8 kg)  Height: 5\' 1"  (1.549 m)      GEN- The patient is well appearing, alert and oriented x 3 today.   Head- normocephalic, atraumatic Eyes-  Sclera clear, conjunctiva pink Ears- hearing intact Oropharynx- clear Lungs- Clear to ausculation bilaterally, normal work of breathing Heart- Regular rate and rhythm, no murmurs, rubs or gallops, PMI not laterally displaced GI- soft, NT, ND, + BS Extremities- no clubbing, cyanosis, or edema  Wt Readings from Last 3 Encounters:  08/04/18 162 lb 9.6 oz (73.8 kg)  04/12/18 165 lb 4.8 oz (75 kg)  02/22/18 170 lb (77.1 kg)    EKG tracing ordered today is personally reviewed and shows sinus rhythm 62 bpm, PR 164 msec, QRS 90 msec, Qtc 432 msec  Assessment and Plan:  1. afib Well controlled off AAD therapy On xarelto Would be a good candidate for REACT AF.  We discussed this as a possible option today.  She would be interested if this study becomes reality.  2. HTN Stable No change required today  3. Overweight Body mass index is 30.72 kg/m. Wt Readings from Last 3 Encounters:  08/04/18 162 lb 9.6 oz (73.8 kg)  04/12/18 165 lb 4.8 oz (75 kg)  02/22/18 170 lb (77.1 kg)   She has made some progress with lifestyle modification.  This is again encouraged today  Follow-up with [name] a scheduled I will see again in a year  Thompson Grayer MD, [facility] 08/04/2018 3:32 PM

## 2018-08-05 NOTE — Addendum Note (Signed)
Addended by: Rose Phi on: 08/05/2018 03:43 PM   Modules accepted: Orders

## 2018-08-27 DIAGNOSIS — Z7901 Long term (current) use of anticoagulants: Secondary | ICD-10-CM | POA: Diagnosis not present

## 2018-08-27 DIAGNOSIS — C3491 Malignant neoplasm of unspecified part of right bronchus or lung: Secondary | ICD-10-CM | POA: Diagnosis not present

## 2018-08-27 DIAGNOSIS — G4730 Sleep apnea, unspecified: Secondary | ICD-10-CM | POA: Diagnosis not present

## 2018-08-27 DIAGNOSIS — J309 Allergic rhinitis, unspecified: Secondary | ICD-10-CM | POA: Diagnosis not present

## 2018-08-27 DIAGNOSIS — K219 Gastro-esophageal reflux disease without esophagitis: Secondary | ICD-10-CM | POA: Diagnosis not present

## 2018-08-27 DIAGNOSIS — G2581 Restless legs syndrome: Secondary | ICD-10-CM | POA: Diagnosis not present

## 2018-08-27 DIAGNOSIS — I480 Paroxysmal atrial fibrillation: Secondary | ICD-10-CM | POA: Diagnosis not present

## 2018-08-27 DIAGNOSIS — F419 Anxiety disorder, unspecified: Secondary | ICD-10-CM | POA: Diagnosis not present

## 2018-08-27 DIAGNOSIS — E782 Mixed hyperlipidemia: Secondary | ICD-10-CM | POA: Diagnosis not present

## 2018-08-27 DIAGNOSIS — I10 Essential (primary) hypertension: Secondary | ICD-10-CM | POA: Diagnosis not present

## 2018-08-27 DIAGNOSIS — Z23 Encounter for immunization: Secondary | ICD-10-CM | POA: Diagnosis not present

## 2018-08-27 DIAGNOSIS — R7301 Impaired fasting glucose: Secondary | ICD-10-CM | POA: Diagnosis not present

## 2018-10-08 ENCOUNTER — Telehealth: Payer: Self-pay | Admitting: Medical Oncology

## 2018-10-08 NOTE — Telephone Encounter (Signed)
Labs moved to Tuesday.

## 2018-10-11 ENCOUNTER — Telehealth: Payer: Self-pay | Admitting: Internal Medicine

## 2018-10-11 ENCOUNTER — Inpatient Hospital Stay: Payer: PPO

## 2018-10-11 NOTE — Telephone Encounter (Signed)
Spoke to pts daughter regarding appts per 11/8 sch message

## 2018-10-12 ENCOUNTER — Inpatient Hospital Stay: Payer: PPO | Attending: Internal Medicine

## 2018-10-12 ENCOUNTER — Ambulatory Visit (HOSPITAL_COMMUNITY)
Admission: RE | Admit: 2018-10-12 | Discharge: 2018-10-12 | Disposition: A | Discharge status: Home / Self Care | Payer: PPO | Source: Ambulatory Visit | Attending: Internal Medicine | Admitting: Internal Medicine

## 2018-10-12 DIAGNOSIS — Z87440 Personal history of urinary (tract) infections: Secondary | ICD-10-CM | POA: Diagnosis not present

## 2018-10-12 DIAGNOSIS — I4891 Unspecified atrial fibrillation: Secondary | ICD-10-CM | POA: Insufficient documentation

## 2018-10-12 DIAGNOSIS — R918 Other nonspecific abnormal finding of lung field: Secondary | ICD-10-CM | POA: Diagnosis not present

## 2018-10-12 DIAGNOSIS — Z7901 Long term (current) use of anticoagulants: Secondary | ICD-10-CM | POA: Insufficient documentation

## 2018-10-12 DIAGNOSIS — Z79899 Other long term (current) drug therapy: Secondary | ICD-10-CM | POA: Insufficient documentation

## 2018-10-12 DIAGNOSIS — I2510 Atherosclerotic heart disease of native coronary artery without angina pectoris: Secondary | ICD-10-CM | POA: Insufficient documentation

## 2018-10-12 DIAGNOSIS — J45909 Unspecified asthma, uncomplicated: Secondary | ICD-10-CM | POA: Diagnosis not present

## 2018-10-12 DIAGNOSIS — C3411 Malignant neoplasm of upper lobe, right bronchus or lung: Secondary | ICD-10-CM | POA: Insufficient documentation

## 2018-10-12 DIAGNOSIS — C3490 Malignant neoplasm of unspecified part of unspecified bronchus or lung: Secondary | ICD-10-CM

## 2018-10-12 DIAGNOSIS — J479 Bronchiectasis, uncomplicated: Secondary | ICD-10-CM | POA: Diagnosis not present

## 2018-10-12 DIAGNOSIS — E785 Hyperlipidemia, unspecified: Secondary | ICD-10-CM | POA: Insufficient documentation

## 2018-10-12 DIAGNOSIS — I10 Essential (primary) hypertension: Secondary | ICD-10-CM | POA: Insufficient documentation

## 2018-10-12 DIAGNOSIS — G4730 Sleep apnea, unspecified: Secondary | ICD-10-CM | POA: Diagnosis not present

## 2018-10-12 DIAGNOSIS — I700 Atherosclerosis of aorta: Secondary | ICD-10-CM | POA: Insufficient documentation

## 2018-10-12 DIAGNOSIS — K219 Gastro-esophageal reflux disease without esophagitis: Secondary | ICD-10-CM | POA: Diagnosis not present

## 2018-10-12 DIAGNOSIS — M1990 Unspecified osteoarthritis, unspecified site: Secondary | ICD-10-CM | POA: Diagnosis not present

## 2018-10-12 DIAGNOSIS — K449 Diaphragmatic hernia without obstruction or gangrene: Secondary | ICD-10-CM | POA: Diagnosis not present

## 2018-10-12 LAB — CMP (CANCER CENTER ONLY)
ALT: 20 U/L (ref 0–44)
AST: 23 U/L (ref 15–41)
Albumin: 3.7 g/dL (ref 3.5–5.0)
Alkaline Phosphatase: 102 U/L (ref 38–126)
Anion gap: 7 (ref 5–15)
BUN: 16 mg/dL (ref 8–23)
CO2: 30 mmol/L (ref 22–32)
Calcium: 9.4 mg/dL (ref 8.9–10.3)
Chloride: 107 mmol/L (ref 98–111)
Creatinine: 0.73 mg/dL (ref 0.44–1.00)
GFR, Est AFR Am: >60 mL/min (ref >60)
GFR, Estimated: >60 mL/min (ref >60)
Glucose, Bld: 70 mg/dL (ref 70–99)
Potassium: 4.6 mmol/L (ref 3.5–5.1)
Sodium: 144 mmol/L (ref 135–145)
Total Bilirubin: 0.3 mg/dL (ref 0.3–1.2)
Total Protein: 6.4 g/dL — ABNORMAL LOW (ref 6.5–8.1)

## 2018-10-12 LAB — CBC WITH DIFFERENTIAL (CANCER CENTER ONLY)
Abs Immature Granulocytes: 0.02 10*3/uL (ref 0.00–0.07)
Basophils Absolute: 0 10*3/uL (ref 0.0–0.1)
Basophils Relative: 0 %
Eosinophils Absolute: 0.1 10*3/uL (ref 0.0–0.5)
Eosinophils Relative: 1 %
HCT: 38 % (ref 36.0–46.0)
Hemoglobin: 12 g/dL (ref 12.0–15.0)
Immature Granulocytes: 0 %
Lymphocytes Relative: 36 %
Lymphs Abs: 2.6 10*3/uL (ref 0.7–4.0)
MCH: 29.4 pg (ref 26.0–34.0)
MCHC: 31.6 g/dL (ref 30.0–36.0)
MCV: 93.1 fL (ref 80.0–100.0)
Monocytes Absolute: 0.5 10*3/uL (ref 0.1–1.0)
Monocytes Relative: 7 %
Neutro Abs: 4.1 10*3/uL (ref 1.7–7.7)
Neutrophils Relative %: 56 %
Platelet Count: 218 10*3/uL (ref 150–400)
RBC: 4.08 MIL/uL (ref 3.87–5.11)
RDW: 12.8 % (ref 11.5–15.5)
WBC Count: 7.3 10*3/uL (ref 4.0–10.5)
nRBC: 0 % (ref 0.0–0.2)

## 2018-10-12 MED ORDER — SODIUM CHLORIDE (PF) 0.9 % IJ SOLN
INTRAMUSCULAR | Status: AC
  Filled 2018-10-12: qty 50

## 2018-10-12 MED ORDER — IOHEXOL 300 MG/ML  SOLN
75.0000 mL | Freq: Once | INTRAMUSCULAR | Status: AC | PRN
  Administered 2018-10-12: 75 mL via INTRAVENOUS

## 2018-10-13 ENCOUNTER — Encounter: Payer: Self-pay | Admitting: Internal Medicine

## 2018-10-13 ENCOUNTER — Inpatient Hospital Stay (HOSPITAL_BASED_OUTPATIENT_CLINIC_OR_DEPARTMENT_OTHER): Payer: PPO | Admitting: Internal Medicine

## 2018-10-13 VITALS — BP 158/54 | HR 51 | Temp 97.5°F | Resp 16 | Ht 61.0 in | Wt 163.0 lb

## 2018-10-13 DIAGNOSIS — C3411 Malignant neoplasm of upper lobe, right bronchus or lung: Secondary | ICD-10-CM | POA: Diagnosis not present

## 2018-10-13 DIAGNOSIS — Z7901 Long term (current) use of anticoagulants: Secondary | ICD-10-CM

## 2018-10-13 DIAGNOSIS — I10 Essential (primary) hypertension: Secondary | ICD-10-CM | POA: Diagnosis not present

## 2018-10-13 DIAGNOSIS — J45909 Unspecified asthma, uncomplicated: Secondary | ICD-10-CM

## 2018-10-13 DIAGNOSIS — G4730 Sleep apnea, unspecified: Secondary | ICD-10-CM | POA: Diagnosis not present

## 2018-10-13 DIAGNOSIS — K219 Gastro-esophageal reflux disease without esophagitis: Secondary | ICD-10-CM | POA: Diagnosis not present

## 2018-10-13 DIAGNOSIS — M1990 Unspecified osteoarthritis, unspecified site: Secondary | ICD-10-CM | POA: Diagnosis not present

## 2018-10-13 DIAGNOSIS — Z87440 Personal history of urinary (tract) infections: Secondary | ICD-10-CM

## 2018-10-13 DIAGNOSIS — E785 Hyperlipidemia, unspecified: Secondary | ICD-10-CM | POA: Diagnosis not present

## 2018-10-13 DIAGNOSIS — Z79899 Other long term (current) drug therapy: Secondary | ICD-10-CM

## 2018-10-13 DIAGNOSIS — C3490 Malignant neoplasm of unspecified part of unspecified bronchus or lung: Secondary | ICD-10-CM

## 2018-10-13 DIAGNOSIS — I4891 Unspecified atrial fibrillation: Secondary | ICD-10-CM

## 2018-10-13 DIAGNOSIS — C3491 Malignant neoplasm of unspecified part of right bronchus or lung: Secondary | ICD-10-CM

## 2018-10-13 NOTE — Progress Notes (Signed)
[place] Telephone:(336) [phone]   Fax:(336) [phone]  OFFICE PROGRESS NOTE  [name], [place] [address]  DIAGNOSIS: Stage Ib (T1c, N0, M0) non-small cell lung cancer, adenocarcinoma but the patient also has multifocal groundglass opacities in the lung bilaterally highly concerning for low-grade adenocarcinoma as well.  Genomic Alteration Identified? KRAS G12V Additional Findings? Microsatellite status MS-Stable Tumor Mutation Burden TMB-Intermediate; 11 Muts/Mb Additional Disease-relevant Genes with No Reportable Alterations Identified? EGFR ALK BRAF MET RET ERBB2 ROS1   PDL1 expression 5%.  PRIOR THERAPY: Status post wedge resection of the right upper lobe on 10/02/2016 under the care of Dr. Roxan Hockey.  CURRENT THERAPY: Observation.  INTERVAL HISTORY: Julie Gill 77 y.o. female returns to the clinic today for follow-up visit accompanied by her husband and daughter.  The patient is feeling fine today with no concerning complaints.  She denied having any recent chest pain, shortness breath, cough or hemoptysis.  She has no recent weight loss or night sweats.  She has no nausea, vomiting, diarrhea or constipation.  She is here today for evaluation after repeating CT scan of the chest for restaging of her disease.  MEDICAL HISTORY: Past Medical History:  Diagnosis Date  . Anxiety   . Arthritis   . Asthma    dx 10 yrs ago  . Atrial fibrillation (HCC)    CHADS VASC score 3. Intolerant to flecainide. s/p afib ablation 02/12/12  . Colon polyps   . Diastolic dysfunction   . Dysrhythmia    a fib   dx 2014  . GERD (gastroesophageal reflux disease)   . History of kidney infection     last one was approx 2009  . Hyperlipidemia   . Hypertension   . Mild sleep apnea    wears NO equipment  . Mitral regurgitation    mild  . Neuromuscular disorder ([place])   . Obesity   . Plantar fasciitis   . Restless leg syndrome     . Sinus bradycardia   . Sleep apnea   . Stress bladder incontinence, female    WEARS SMALL PADS    ALLERGIES:  is allergic to codeine and prednisone.  MEDICATIONS:  Current Outpatient Medications  Medication Sig Dispense Refill  . calcium carbonate (OS-CAL) 600 MG TABS Take 600 mg by mouth daily.     . Cholecalciferol (VITAMIN D PO) Take 4,000 Units by mouth daily.      . clonazePAM (KLONOPIN) 0.5 MG tablet Take 0.25 mg by mouth 2 (two) times daily as needed for anxiety.     . fish oil-omega-3 fatty acids 1000 MG capsule Take 1 g by mouth 2 (two) times daily.     . hydrochlorothiazide (HYDRODIURIL) 25 MG tablet Take 1/2 tablet by mouth once daily 45 tablet 3  . lisinopril (PRINIVIL,ZESTRIL) 40 MG tablet Take 40 mg by mouth daily.    Marland Kitchen lovastatin (MEVACOR) 40 MG tablet Take 80 mg by mouth daily.     Marland Kitchen MAGNESIUM PO Take 250 mg by mouth daily.    . Multiple Vitamins-Minerals (MULTIVITAMIN WITH MINERALS) tablet Take 1 tablet by mouth daily.      . nitroGLYCERIN (NITROSTAT) 0.4 MG SL tablet Place 1 tablet (0.4 mg total) under the tongue every 5 (five) minutes as needed for chest pain. 25 tablet 3  . potassium chloride SA (K-DUR,KLOR-CON) 20 MEQ tablet Take 20 mEq by mouth daily.    Marland Kitchen senna (SENOKOT) 8.6 MG tablet Take 1  tablet by mouth daily.     . vitamin E (VITAMIN E) 400 UNIT capsule Take 400 Units by mouth daily.     Alveda Reasons 20 MG TABS tablet Take 20 mg by mouth daily with supper.      No current facility-administered medications for this visit.     SURGICAL HISTORY:  Past Surgical History:  Procedure Laterality Date  . ABDOMINAL HYSTERECTOMY  1992   BSO  . APPENDECTOMY    . atrial fibrillation ablation  02/12/12   PVI by Dr Rayann Heman  . ATRIAL FIBRILLATION ABLATION N/A 02/12/2012   Procedure: ATRIAL FIBRILLATION ABLATION;  Surgeon: Thompson Grayer, MD;  Location: [facility] CATH LAB;  Service: Cardiovascular;  Laterality: N/A;  . BLADDER SUSPENSION    . [place],  200,2004, 2007, 05/06/2011   adenomas  . heels spurs     x 2  . TEE WITHOUT CARDIOVERSION  02/11/2012   Procedure: TRANSESOPHAGEAL ECHOCARDIOGRAM (TEE);  Surgeon: Larey Dresser, MD;  Location: [place];  Service: Cardiovascular;  Laterality: N/A;  . TONSILLECTOMY AND ADENOIDECTOMY    . VARICOSE VEIN SURGERY    . VIDEO ASSISTED THORACOSCOPY (VATS)/WEDGE RESECTION Right 10/02/2016   Procedure: VIDEO ASSISTED THORACOSCOPY (VATS)/WEDGE RESECTION;  Surgeon: [name], MD;  Location: [place];  Service: Thoracic;  Laterality: Right;    REVIEW OF SYSTEMS:  A comprehensive review of systems was negative.   PHYSICAL EXAMINATION: General appearance: alert, cooperative and no distress Head: Normocephalic, without obvious abnormality, atraumatic Neck: no adenopathy, no JVD, supple, symmetrical, trachea midline and thyroid not enlarged, symmetric, no tenderness/mass/nodules Lymph nodes: Cervical, supraclavicular, and axillary nodes normal. Resp: clear to auscultation bilaterally Back: symmetric, no curvature. ROM normal. No CVA tenderness. Cardio: regular rate and rhythm, S1, S2 normal, no murmur, click, rub or gallop GI: soft, non-tender; bowel sounds normal; no masses,  no organomegaly Extremities: extremities normal, atraumatic, no cyanosis or edema  ECOG PERFORMANCE STATUS: 1 - Symptomatic but completely ambulatory  Blood pressure (!) 158/54, pulse (!) 51, temperature (!) 97.5 F (36.4 C), temperature source Oral, resp. rate 16, height 5' 1"  (1.549 m), weight 163 lb (73.9 kg), SpO2 100 %.  LABORATORY DATA: Lab Results  Component Value Date   WBC 7.3 10/12/2018   HGB 12.0 10/12/2018   HCT 38.0 10/12/2018   MCV 93.1 10/12/2018   PLT 218 10/12/2018      Chemistry      Component Value Date/Time   NA 144 10/12/2018 1524   NA 142 09/28/2017 0959   K 4.6 10/12/2018 1524   K 4.4 09/28/2017 0959   CL 107 10/12/2018 1524   CO2 30 10/12/2018 1524   CO2 28 09/28/2017 0959   BUN  16 10/12/2018 1524   BUN 21.3 09/28/2017 0959   CREATININE 0.73 10/12/2018 1524   CREATININE 0.9 09/28/2017 0959      Component Value Date/Time   CALCIUM 9.4 10/12/2018 1524   CALCIUM 9.9 09/28/2017 0959   ALKPHOS 102 10/12/2018 1524   ALKPHOS 87 09/28/2017 0959   AST 23 10/12/2018 1524   AST 24 09/28/2017 0959   ALT 20 10/12/2018 1524   ALT 24 09/28/2017 0959   BILITOT 0.3 10/12/2018 1524   BILITOT 0.33 09/28/2017 0959       RADIOGRAPHIC STUDIES: No results found.  ASSESSMENT AND PLAN:  This is a very pleasant 77 years old white female with stage IB non-small cell lung cancer, adenocarcinoma with no actionable mutations and several other groundglass opacities bilaterally.  The patient is currently on observation with no concerning adverse effects. She had repeat CT scan of the chest performed yesterday.  I personally and independently reviewed the scan and discussed the results with the patient today.  Her scan showed no concerning findings for disease progression but the final report is still pending.  I will call the patient if there is any concerning findings on the final report. I recommended for the patient to continue on observation with repeat CT scan of the chest in 6 months. She was advised to call immediately if she has any concerning symptoms in the interval. The patient voices understanding of current disease status and treatment options and is in agreement with the current care plan.  All questions were answered. The patient knows to call the clinic with any problems, questions or concerns. We can certainly see the patient much sooner if necessary.  Disclaimer: This note was dictated with voice recognition software. Similar sounding words can inadvertently be transcribed and may not be corrected upon review.

## 2018-12-06 ENCOUNTER — Other Ambulatory Visit: Payer: Self-pay | Admitting: Internal Medicine

## 2018-12-06 MED ORDER — HYDROCHLOROTHIAZIDE 25 MG PO TABS: ORAL_TABLET | ORAL | 2 refills | Status: DC

## 2018-12-21 ENCOUNTER — Other Ambulatory Visit: Payer: Self-pay | Admitting: Family Medicine

## 2018-12-21 DIAGNOSIS — Z1231 Encounter for screening mammogram for malignant neoplasm of breast: Secondary | ICD-10-CM

## 2019-01-25 ENCOUNTER — Ambulatory Visit
Admission: RE | Admit: 2019-01-25 | Discharge: 2019-01-25 | Disposition: A | Discharge status: Home / Self Care | Payer: PPO | Source: Ambulatory Visit | Attending: Family Medicine | Admitting: Family Medicine

## 2019-01-25 DIAGNOSIS — Z1231 Encounter for screening mammogram for malignant neoplasm of breast: Secondary | ICD-10-CM

## 2019-02-07 DIAGNOSIS — F419 Anxiety disorder, unspecified: Secondary | ICD-10-CM | POA: Diagnosis not present

## 2019-02-07 DIAGNOSIS — Z7901 Long term (current) use of anticoagulants: Secondary | ICD-10-CM | POA: Diagnosis not present

## 2019-02-07 DIAGNOSIS — N3941 Urge incontinence: Secondary | ICD-10-CM | POA: Diagnosis not present

## 2019-02-07 DIAGNOSIS — K219 Gastro-esophageal reflux disease without esophagitis: Secondary | ICD-10-CM | POA: Diagnosis not present

## 2019-02-07 DIAGNOSIS — G4730 Sleep apnea, unspecified: Secondary | ICD-10-CM | POA: Diagnosis not present

## 2019-02-07 DIAGNOSIS — R911 Solitary pulmonary nodule: Secondary | ICD-10-CM | POA: Diagnosis not present

## 2019-02-07 DIAGNOSIS — G2581 Restless legs syndrome: Secondary | ICD-10-CM | POA: Diagnosis not present

## 2019-02-07 DIAGNOSIS — I10 Essential (primary) hypertension: Secondary | ICD-10-CM | POA: Diagnosis not present

## 2019-02-07 DIAGNOSIS — Z01419 Encounter for gynecological examination (general) (routine) without abnormal findings: Secondary | ICD-10-CM | POA: Diagnosis not present

## 2019-02-07 DIAGNOSIS — E782 Mixed hyperlipidemia: Secondary | ICD-10-CM | POA: Diagnosis not present

## 2019-02-07 DIAGNOSIS — Z0000 Encounter for general adult medical examination without abnormal findings: Secondary | ICD-10-CM | POA: Diagnosis not present

## 2019-02-07 DIAGNOSIS — I480 Paroxysmal atrial fibrillation: Secondary | ICD-10-CM | POA: Diagnosis not present

## 2019-02-07 DIAGNOSIS — C3491 Malignant neoplasm of unspecified part of right bronchus or lung: Secondary | ICD-10-CM | POA: Diagnosis not present

## 2019-03-21 ENCOUNTER — Telehealth: Payer: Self-pay | Admitting: *Deleted

## 2019-03-21 NOTE — Telephone Encounter (Signed)
Virtual Visit Pre-Appointment Phone Call  Steps For Call:  1. Confirm consent - "In the setting of the current Covid19 crisis, you are scheduled for a (phone or video) visit with your provider on (Monday, April 27) at (3:30 pm).  Just as we do with many in-office visits, in order for you to participate in this visit, we must obtain consent.  If you'd like, I can send this to your mychart (if signed up) or email for you to review.  Otherwise, I can obtain your verbal consent now.  All virtual visits are billed to your insurance company just like a normal visit would be.  By agreeing to a virtual visit, we'd like you to understand that the technology does not allow for your provider to perform an examination, and thus may limit your provider's ability to fully assess your condition. If your provider identifies any concerns that need to be evaluated in person, we will make arrangements to do so.  Finally, though the technology is pretty good, we cannot assure that it will always work on either your or our end, and in the setting of a video visit, we may have to convert it to a phone-only visit.  In either situation, we cannot ensure that we have a secure connection.  Are you willing to proceed?" STAFF: Did the patient verbally acknowledge consent to telehealth visit? Document YES/NO here: YES.  2. Confirm the BEST phone number to call the day of the visit by including in appointment notes  3. Give patient instructions for MyChart download to smartphone OR Doximity/Doxy.me as below if video visit (depending on what platform provider is using)  4. Confirm that appointment type is correct in Epic appointment notes (VIDEO vs PHONE)  5. Advise patient to be prepared with their blood pressure, heart rate, weight, any heart rhythm information, their current medicines, and a piece of paper and pen handy for any instructions they may receive the day of their visit  6. Inform patient they will receive a phone  call 15 minutes prior to their appointment time (may be from unknown caller ID) so they should be prepared to answer    TELEPHONE CALL NOTE  Julie Gill has been deemed a candidate for a follow-up tele-health visit to limit community exposure during the Covid-19 pandemic. I spoke with the patient via phone to ensure availability of phone/video source, confirm preferred email & phone number, and discuss instructions and expectations.  I reminded Julie Gill to be prepared with any vital sign and/or heart rhythm information that could potentially be obtained via home monitoring, at the time of her visit. I reminded Julie Gill to expect a phone call prior to her visit.  Julie Gill 03/21/2019 3:39 PM   INSTRUCTIONS FOR DOWNLOADING THE MYCHART APP TO SMARTPHONE  - The patient must first make sure to have activated MyChart and know their login information - If Apple, go to [employer] and type in MyChart in the search bar and download the app. If Android, ask patient to go to [employer] and type in [place] in the search bar and download the app. The app is free but as with any other app downloads, their phone may require them to verify saved payment information or Apple/Android password.  - The patient will need to then log into the app with their MyChart username and password, and select [facility] as their healthcare provider to link the account. When it is time  for your visit, go to the MyChart app, find appointments, and click Begin Video Visit. Be sure to Select Allow for your device to access the Microphone and Camera for your visit. You will then be connected, and your provider will be with you shortly.  **If they have any issues connecting, or need assistance please contact MyChart service desk [phone])**  **If using a computer, in order to ensure the best quality for their visit they will need to use either of the following Internet Browsers:  [employer], or Google Chrome**  IF USING DOXIMITY or DOXY.ME - The patient will receive a link just prior to their visit by text.     FULL LENGTH CONSENT FOR TELE-HEALTH VISIT   I hereby voluntarily request, consent and authorize [facility] and its employed or contracted physicians, physician assistants, nurse practitioners or other licensed health care professionals (the Practitioner), to provide me with telemedicine health care services (the "Services") as deemed necessary by the treating Practitioner. I acknowledge and consent to receive the Services by the Practitioner via telemedicine. I understand that the telemedicine visit will involve communicating with the Practitioner through live audiovisual communication technology and the disclosure of certain medical information by electronic transmission. I acknowledge that I have been given the opportunity to request an in-person assessment or other available alternative prior to the telemedicine visit and am voluntarily participating in the telemedicine visit.  I understand that I have the right to withhold or withdraw my consent to the use of telemedicine in the course of my care at any time, without affecting my right to future care or treatment, and that the Practitioner or I may terminate the telemedicine visit at any time. I understand that I have the right to inspect all information obtained and/or recorded in the course of the telemedicine visit and may receive copies of available information for a reasonable fee.  I understand that some of the potential risks of receiving the Services via telemedicine include:  Marland Kitchen Delay or interruption in medical evaluation due to technological equipment failure or disruption; . Information transmitted may not be sufficient (e.g. poor resolution of images) to allow for appropriate medical decision making by the Practitioner; and/or  . In rare instances, security protocols could fail, causing a breach of  personal health information.  Furthermore, I acknowledge that it is my responsibility to provide information about my medical history, conditions and care that is complete and accurate to the best of my ability. I acknowledge that Practitioner's advice, recommendations, and/or decision may be based on factors not within their control, such as incomplete or inaccurate data provided by me or distortions of diagnostic images or specimens that may result from electronic transmissions. I understand that the practice of medicine is not an exact science and that Practitioner makes no warranties or guarantees regarding treatment outcomes. I acknowledge that I will receive a copy of this consent concurrently upon execution via email to the email address I last provided but may also request a printed copy by calling the office of [facility].    I understand that my insurance will be billed for this visit.   I have read or had this consent read to me. . I understand the contents of this consent, which adequately explains the benefits and risks of the Services being provided via telemedicine.  . I have been provided ample opportunity to ask questions regarding this consent and the Services and have had my questions answered to my satisfaction. Marland Kitchen I  give my informed consent for the services to be provided through the use of telemedicine in my medical care  By participating in this telemedicine visit I agree to the above.

## 2019-03-27 NOTE — Progress Notes (Signed)
Telehealth Visit     Virtual Visit via Telephone Note   This visit type was conducted due to national recommendations for restrictions regarding the COVID-19 Pandemic (e.g. social distancing) in an effort to limit this patient's exposure and mitigate transmission in our community.  Due to her co-morbid illnesses, this patient is at least at moderate risk for complications without adequate follow up.  This format is felt to be most appropriate for this patient at this time.  The patient did not have access to video technology/had technical difficulties with video requiring transitioning to audio format only (telephone).  All issues noted in this document were discussed and addressed.  No physical exam could be performed with this format.  Please refer to the patient's chart for her  consent to telehealth for [facility].   Evaluation Performed:  Follow-up visit  This visit type was conducted due to national recommendations for restrictions regarding the COVID-19 Pandemic (e.g. social distancing).  This format is felt to be most appropriate for this patient at this time.  All issues noted in this document were discussed and addressed.  No physical exam was performed (except for noted visual exam findings with Video Visits).  Please refer to the patient's chart (MyChart message for video visits and phone note for telephone visits) for the patient's consent to telehealth for [facility].  Date:  03/28/2019   ID:  [name], DOB 08-06-1941, MRN [id number]  Patient Location:  Home  Provider location:   Home  PCP:  [name], MD  Cardiologist:  [place] Electrophysiologist:  None   Chief Complaint:  Follow up  History of Present Illness:    Julie Gill is a 78 y.o. female who presents via audio/video conferencing for a telehealth visit today.  Seen for Dr. Rayann Heman.   She has no known CAD. Does have PAF with prior ablation. Committed to long term anticoagulation -  previously on Coumadin - now on Xarelto. Other issues include GERD, HLD, HTN, mild sleep apnea, chronic bradycardia, restless legs, anxiety and obesity. She had a negative Myoview last December 2012. Last echo in 2013. Negative GXT in 2015. Labs are checked by primary care.   She was diagnosed with lung cancer in 2017 - had R VATS. Beta blocker stopped due to bradycardia during that admission. Pretty depressed on follow up but then recovered - lost weight and was feeling good.   Last seen by me in March of 2019 - last seen by Dr. Rayann Heman in September of 2019.    The patient does not have symptoms concerning for COVID-19 infection (fever, chills, cough, or new shortness of breath).   Seen today via telephone visit. She does not have a smart phone nor Internet. She has consented for this visit. She feels like she is doing well but has had stress - her sister died back in the fall - this has been hard for her. Her PCP cut her ACE back - BP is up today. Biggest issue is she can't sleep - was given Clonazepam - does not wish to take regularly - worried about "getting addicted". She is still doing Weight Watcher's and continues to be successful with weight loss. She is walking about 3 times a week for 2 miles. BP typically about 174 systolic since her ACE was cut in half. Rhythm has been ok. Not dizzy or lightheaded for the most part. Overall she feels like she is doing ok.   Past Medical History:  Diagnosis  Date  . Anxiety   . Arthritis   . Asthma    dx 10 yrs ago  . Atrial fibrillation (HCC)    CHADS VASC score 3. Intolerant to flecainide. s/p afib ablation 02/12/12  . Colon polyps   . Diastolic dysfunction   . Dysrhythmia    a fib   dx 2014  . GERD (gastroesophageal reflux disease)   . History of kidney infection     last one was approx 2009  . Hyperlipidemia   . Hypertension   . Mild sleep apnea    wears NO equipment  . Mitral regurgitation    mild  . Neuromuscular disorder ([place])   .  Obesity   . Plantar fasciitis   . Restless leg syndrome   . Sinus bradycardia   . Sleep apnea   . Stress bladder incontinence, female    WEARS SMALL PADS   Past Surgical History:  Procedure Laterality Date  . ABDOMINAL HYSTERECTOMY  1992   BSO  . APPENDECTOMY    . atrial fibrillation ablation  02/12/12   PVI by Dr Rayann Heman  . ATRIAL FIBRILLATION ABLATION N/A 02/12/2012   Procedure: ATRIAL FIBRILLATION ABLATION;  Surgeon: Thompson Grayer, MD;  Location: [facility] CATH LAB;  Service: Cardiovascular;  Laterality: N/A;  . BLADDER SUSPENSION    . [place], 200,2004, 2007, 05/06/2011   adenomas  . heels spurs     x 2  . TEE WITHOUT CARDIOVERSION  02/11/2012   Procedure: TRANSESOPHAGEAL ECHOCARDIOGRAM (TEE);  Surgeon: Larey Dresser, MD;  Location: [facility];  Service: Cardiovascular;  Laterality: N/A;  . TONSILLECTOMY AND ADENOIDECTOMY    . VARICOSE VEIN SURGERY    . VIDEO ASSISTED THORACOSCOPY (VATS)/WEDGE RESECTION Right 10/02/2016   Procedure: VIDEO ASSISTED THORACOSCOPY (VATS)/WEDGE RESECTION;  Surgeon: [name], MD;  Location: [facility];  Service: Thoracic;  Laterality: Right;     Current Meds  Medication Sig  . calcium carbonate (OS-CAL) 600 MG TABS Take 600 mg by mouth daily.   . Cholecalciferol (VITAMIN D PO) Take 4,000 Units by mouth daily.    . clonazePAM (KLONOPIN) 0.5 MG tablet Take 0.25 mg by mouth 2 (two) times daily as needed for anxiety.   . fish oil-omega-3 fatty acids 1000 MG capsule Take 1 g by mouth 2 (two) times daily.   . hydrochlorothiazide (MICROZIDE) 12.5 MG capsule Take 12.5 mg by mouth daily.  Marland Kitchen lisinopril (ZESTRIL) 20 MG tablet Take 20 mg by mouth daily.  Marland Kitchen lovastatin (MEVACOR) 40 MG tablet Take 80 mg by mouth daily.   Marland Kitchen MAGNESIUM PO Take 250 mg by mouth daily.  . nitroGLYCERIN (NITROSTAT) 0.4 MG SL tablet Place 1 tablet (0.4 mg total) under the tongue every 5 (five) minutes as needed for chest pain.  . potassium chloride SA  (K-DUR,KLOR-CON) 20 MEQ tablet Take 20 mEq by mouth daily.  Marland Kitchen senna (SENOKOT) 8.6 MG tablet Take 1 tablet by mouth daily.   . vitamin E (VITAMIN E) 400 UNIT capsule Take 400 Units by mouth daily.   Alveda Reasons 20 MG TABS tablet Take 20 mg by mouth daily with supper.      Allergies:   Codeine and Prednisone   Social History   Tobacco Use  . Smoking status: Former Smoker    Types: Cigarettes    Last attempt to quit: 09/17/1985    Years since quitting: 33.5  . Smokeless tobacco: Never Used  . Tobacco comment: 5 CIG /DAY  Substance Use Topics  .  Alcohol use: No  . Drug use: No     Family Hx: The patient's family history includes Colon cancer in her maternal grandmother and maternal uncle; Congestive Heart Failure in her mother; Diabetes in her brother; Hypertension in her brother; Lung cancer in her father. There is no history of Breast cancer.  ROS:   Please see the history of present illness.   All other systems reviewed are negative.    Objective:    Vital Signs:  BP (!) 167/62   Pulse (!) 47   Ht 5' 1.5" (1.562 m)   Wt 161 lb (73 kg)   BMI 29.93 kg/m    Wt Readings from Last 3 Encounters:  03/28/19 161 lb (73 kg)  10/13/18 163 lb (73.9 kg)  08/04/18 162 lb 9.6 oz (73.8 kg)    Alert female in no acute distress. Not short of breath with conversation. Appropriate responses.    Labs/Other Tests and Data Reviewed:    Lab Results  Component Value Date   WBC 7.3 10/12/2018   HGB 12.0 10/12/2018   HCT 38.0 10/12/2018   PLT 218 10/12/2018   GLUCOSE 70 10/12/2018   ALT 20 10/12/2018   AST 23 10/12/2018   NA 144 10/12/2018   K 4.6 10/12/2018   CL 107 10/12/2018   CREATININE 0.73 10/12/2018   BUN 16 10/12/2018   CO2 30 10/12/2018   INR 1.25 09/29/2016        BNP (last 3 results) No results for input(s): BNP in the last 8760 hours.  ProBNP (last 3 results) No results for input(s): PROBNP in the last 8760 hours.    Prior CV studies:    The following  studies were reviewed today:  Echo Study Conclusions from 05/2016  - Left ventricle: The cavity size was normal. There was moderate focal basal hypertrophy. Systolic function was normal. The estimated ejection fraction was in the range of 55% to 60%. Wall motion was normal; there were no regional wall motion abnormalities. The transmitral flow pattern was normal. Left ventricular diastolic function parameters were normal. - Mitral valve: Calcified annulus. - Right ventricle: The cavity size was mildly dilated. Wall thickness was normal.  GXT Comments from May 2015: Patient presents today for routine GXT. Has had chest pain - no known CAD. Has HTN, HLD and obesity. She held 2 doses of her beta blocker prior to this study.  Today the patient exercised on the standard [place] protocol for a total of 6:30 minutes.  Reduced exercise tolerance.  Adequate blood pressure response.  Clinically negative for chest pain. Test was stopped due to fatigue/reported very mild chest pain that resolved quickly in recovery.  EKG negative for ischemia. Noted to have occasional PACs and PVCs. More PVC's noted at peak of exercise.    ASSESSMENT & PLAN:    1. PAF - she is intolerant to flecainide and has had prior ablation - she did have recurrence with her lung surgery back in 2017 - not surprising - no longer on beta blocker due to prior symptomatic bradycardia - currentlywithoutrecurrence. Remains bradycardic per her reading and is asymptomatic. No changes made today.   2. HTN - BP little high - she will continue to monitor - may need to go back to the 40 mg of Lisinopril.   3. Chronic anticoagulation - now on Xarelto - no problems noted. BMET from PCP from last month noted.   4. Obesity -she has been quite successful with Weight Watcher's - she is Advertising account executive.  Encouraged her to keep up the good work.   5.PriorVATS for lung cancer - followed by oncology - for repeat scan  next month.   6. Sleep disorder - per PCP.   7. COVID-19 Education: The signs and symptoms of COVID-19 were discussed with the patient and how to seek care for testing (follow up with PCP or arrange E-visit).  The importance of social distancing, staying at home and hand hygiene were discussed today.  Patient Risk:   After full review of this patient's clinical status, I feel that they are at least moderate risk at this time.  Time:   Today, I have spent 11 minutes with the patient with telehealth technology discussing the above issues.     Medication Adjustments/Labs and Tests Ordered: Current medicines are reviewed at length with the patient today.  Concerns regarding medicines are outlined above.   Tests Ordered: No orders of the defined types were placed in this encounter.   Medication Changes: No orders of the defined types were placed in this encounter.   Disposition:  FU with Dr. Rayann Heman in September per the recall. I am happy to see back next year and has needed.    Patient is agreeable to this plan and will call if any problems develop in the interim.   Amie Critchley, NP  03/28/2019 3:39 PM    [facility]

## 2019-03-28 ENCOUNTER — Telehealth (INDEPENDENT_AMBULATORY_CARE_PROVIDER_SITE_OTHER): Payer: PPO | Admitting: Nurse Practitioner

## 2019-03-28 ENCOUNTER — Other Ambulatory Visit: Payer: Self-pay

## 2019-03-28 ENCOUNTER — Encounter: Payer: Self-pay | Admitting: Nurse Practitioner

## 2019-03-28 VITALS — BP 167/62 | HR 47 | Ht 61.5 in | Wt 161.0 lb

## 2019-03-28 DIAGNOSIS — I480 Paroxysmal atrial fibrillation: Secondary | ICD-10-CM

## 2019-03-28 DIAGNOSIS — I10 Essential (primary) hypertension: Secondary | ICD-10-CM

## 2019-03-28 DIAGNOSIS — Z7189 Other specified counseling: Secondary | ICD-10-CM

## 2019-03-28 NOTE — Patient Instructions (Addendum)
After Visit Summary:  We will be checking the following labs today -  NONE   Medication Instructions:    Continue with your current medicines.    If you need a refill on your cardiac medications before your next appointment, please call your pharmacy.     Testing/Procedures To Be Arranged:  N/A  Follow-Up:   See Dr. Rayann Heman in September    At [facility], you and your health needs are our priority.  As part of our continuing mission to provide you with exceptional heart care, we have created designated Provider Care Teams.  These Care Teams include your primary Cardiologist (physician) and Advanced Practice Providers (APPs -  Physician Assistants and Nurse Practitioners) who all work together to provide you with the care you need, when you need it.  Special Instructions:  . Stay safe, stay home and wash your hands for at least 20 seconds! . It was good to talk with you today.  Marland Kitchen Keep a check on your BP - if staying consistently above 140's - let's go back to the full dose of Lisinopril 40 mg - let me know if you need this refilled.  Marland Kitchen Keep up the good work with walking and [employer]!!  Call the [facility] office at [phone] if you have any questions, problems or concerns.

## 2019-04-11 ENCOUNTER — Other Ambulatory Visit: Payer: Self-pay

## 2019-04-11 ENCOUNTER — Ambulatory Visit (HOSPITAL_COMMUNITY)
Admission: RE | Admit: 2019-04-11 | Discharge: 2019-04-11 | Disposition: A | Discharge status: Home / Self Care | Payer: PPO | Source: Ambulatory Visit | Attending: Internal Medicine | Admitting: Internal Medicine

## 2019-04-11 ENCOUNTER — Encounter (HOSPITAL_COMMUNITY): Payer: Self-pay

## 2019-04-11 ENCOUNTER — Inpatient Hospital Stay: Payer: PPO | Attending: Internal Medicine

## 2019-04-11 ENCOUNTER — Ambulatory Visit (HOSPITAL_COMMUNITY): Admission: RE | Admit: 2019-04-11 | Payer: PPO | Source: Ambulatory Visit

## 2019-04-11 DIAGNOSIS — C3491 Malignant neoplasm of unspecified part of right bronchus or lung: Secondary | ICD-10-CM | POA: Diagnosis not present

## 2019-04-11 DIAGNOSIS — K219 Gastro-esophageal reflux disease without esophagitis: Secondary | ICD-10-CM | POA: Insufficient documentation

## 2019-04-11 DIAGNOSIS — I10 Essential (primary) hypertension: Secondary | ICD-10-CM | POA: Insufficient documentation

## 2019-04-11 DIAGNOSIS — J45909 Unspecified asthma, uncomplicated: Secondary | ICD-10-CM | POA: Diagnosis not present

## 2019-04-11 DIAGNOSIS — Z79899 Other long term (current) drug therapy: Secondary | ICD-10-CM | POA: Diagnosis not present

## 2019-04-11 DIAGNOSIS — I700 Atherosclerosis of aorta: Secondary | ICD-10-CM | POA: Diagnosis not present

## 2019-04-11 DIAGNOSIS — C3411 Malignant neoplasm of upper lobe, right bronchus or lung: Secondary | ICD-10-CM | POA: Insufficient documentation

## 2019-04-11 DIAGNOSIS — G4730 Sleep apnea, unspecified: Secondary | ICD-10-CM | POA: Insufficient documentation

## 2019-04-11 DIAGNOSIS — C3490 Malignant neoplasm of unspecified part of unspecified bronchus or lung: Secondary | ICD-10-CM

## 2019-04-11 DIAGNOSIS — E785 Hyperlipidemia, unspecified: Secondary | ICD-10-CM | POA: Insufficient documentation

## 2019-04-11 DIAGNOSIS — I4891 Unspecified atrial fibrillation: Secondary | ICD-10-CM | POA: Diagnosis not present

## 2019-04-11 DIAGNOSIS — M1990 Unspecified osteoarthritis, unspecified site: Secondary | ICD-10-CM | POA: Insufficient documentation

## 2019-04-11 DIAGNOSIS — R918 Other nonspecific abnormal finding of lung field: Secondary | ICD-10-CM | POA: Diagnosis not present

## 2019-04-11 DIAGNOSIS — Z7901 Long term (current) use of anticoagulants: Secondary | ICD-10-CM | POA: Insufficient documentation

## 2019-04-11 LAB — CBC WITH DIFFERENTIAL (CANCER CENTER ONLY)
Abs Immature Granulocytes: 0.01 10*3/uL (ref 0.00–0.07)
Basophils Absolute: 0 10*3/uL (ref 0.0–0.1)
Basophils Relative: 0 %
Eosinophils Absolute: 0.1 10*3/uL (ref 0.0–0.5)
Eosinophils Relative: 2 %
HCT: 39.5 % (ref 36.0–46.0)
Hemoglobin: 12.2 g/dL (ref 12.0–15.0)
Immature Granulocytes: 0 %
Lymphocytes Relative: 38 %
Lymphs Abs: 2 10*3/uL (ref 0.7–4.0)
MCH: 29.5 pg (ref 26.0–34.0)
MCHC: 30.9 g/dL (ref 30.0–36.0)
MCV: 95.6 fL (ref 80.0–100.0)
Monocytes Absolute: 0.4 10*3/uL (ref 0.1–1.0)
Monocytes Relative: 8 %
Neutro Abs: 2.7 10*3/uL (ref 1.7–7.7)
Neutrophils Relative %: 52 %
Platelet Count: 218 10*3/uL (ref 150–400)
RBC: 4.13 MIL/uL (ref 3.87–5.11)
RDW: 12.8 % (ref 11.5–15.5)
WBC Count: 5.2 10*3/uL (ref 4.0–10.5)
nRBC: 0 % (ref 0.0–0.2)

## 2019-04-11 LAB — CMP (CANCER CENTER ONLY)
ALT: 25 U/L (ref 0–44)
AST: 20 U/L (ref 15–41)
Albumin: 3.7 g/dL (ref 3.5–5.0)
Alkaline Phosphatase: 93 U/L (ref 38–126)
Anion gap: 7 (ref 5–15)
BUN: 18 mg/dL (ref 8–23)
CO2: 29 mmol/L (ref 22–32)
Calcium: 9.1 mg/dL (ref 8.9–10.3)
Chloride: 106 mmol/L (ref 98–111)
Creatinine: 0.82 mg/dL (ref 0.44–1.00)
GFR, Est AFR Am: >60 mL/min (ref >60)
GFR, Estimated: >60 mL/min (ref >60)
Glucose, Bld: 86 mg/dL (ref 70–99)
Potassium: 4.3 mmol/L (ref 3.5–5.1)
Sodium: 142 mmol/L (ref 135–145)
Total Bilirubin: 0.3 mg/dL (ref 0.3–1.2)
Total Protein: 6.4 g/dL — ABNORMAL LOW (ref 6.5–8.1)

## 2019-04-11 MED ORDER — IOHEXOL 300 MG/ML  SOLN
75.0000 mL | Freq: Once | INTRAMUSCULAR | Status: AC | PRN
  Administered 2019-04-11: 11:00:00 75 mL via INTRAVENOUS

## 2019-04-11 MED ORDER — SODIUM CHLORIDE (PF) 0.9 % IJ SOLN
INTRAMUSCULAR | Status: AC
  Filled 2019-04-11: qty 50

## 2019-04-13 ENCOUNTER — Encounter: Payer: Self-pay | Admitting: Internal Medicine

## 2019-04-13 ENCOUNTER — Other Ambulatory Visit: Payer: Self-pay

## 2019-04-13 ENCOUNTER — Inpatient Hospital Stay (HOSPITAL_BASED_OUTPATIENT_CLINIC_OR_DEPARTMENT_OTHER): Payer: PPO | Admitting: Internal Medicine

## 2019-04-13 VITALS — BP 147/48 | HR 61 | Temp 98.4°F | Resp 20 | Ht 61.5 in | Wt 164.8 lb

## 2019-04-13 DIAGNOSIS — K219 Gastro-esophageal reflux disease without esophagitis: Secondary | ICD-10-CM | POA: Diagnosis not present

## 2019-04-13 DIAGNOSIS — R918 Other nonspecific abnormal finding of lung field: Secondary | ICD-10-CM | POA: Diagnosis not present

## 2019-04-13 DIAGNOSIS — G4730 Sleep apnea, unspecified: Secondary | ICD-10-CM | POA: Diagnosis not present

## 2019-04-13 DIAGNOSIS — Z79899 Other long term (current) drug therapy: Secondary | ICD-10-CM | POA: Diagnosis not present

## 2019-04-13 DIAGNOSIS — I700 Atherosclerosis of aorta: Secondary | ICD-10-CM

## 2019-04-13 DIAGNOSIS — I4891 Unspecified atrial fibrillation: Secondary | ICD-10-CM

## 2019-04-13 DIAGNOSIS — E785 Hyperlipidemia, unspecified: Secondary | ICD-10-CM | POA: Diagnosis not present

## 2019-04-13 DIAGNOSIS — M1990 Unspecified osteoarthritis, unspecified site: Secondary | ICD-10-CM

## 2019-04-13 DIAGNOSIS — I10 Essential (primary) hypertension: Secondary | ICD-10-CM | POA: Diagnosis not present

## 2019-04-13 DIAGNOSIS — C3490 Malignant neoplasm of unspecified part of unspecified bronchus or lung: Secondary | ICD-10-CM

## 2019-04-13 DIAGNOSIS — Z7901 Long term (current) use of anticoagulants: Secondary | ICD-10-CM

## 2019-04-13 DIAGNOSIS — C3411 Malignant neoplasm of upper lobe, right bronchus or lung: Secondary | ICD-10-CM | POA: Diagnosis not present

## 2019-04-13 DIAGNOSIS — J45909 Unspecified asthma, uncomplicated: Secondary | ICD-10-CM | POA: Diagnosis not present

## 2019-04-13 DIAGNOSIS — C3491 Malignant neoplasm of unspecified part of right bronchus or lung: Secondary | ICD-10-CM

## 2019-04-13 NOTE — Progress Notes (Signed)
[facility] [phone]   [phone]  OFFICE PROGRESS NOTE  [name], [place] [address]  DIAGNOSIS: Stage Ib (T1c, N0, M0) non-small cell lung cancer, adenocarcinoma but the patient also has multifocal groundglass opacities in the lung bilaterally highly concerning for low-grade adenocarcinoma as well.  Genomic Alteration Identified? KRAS G12V Additional Findings? Microsatellite status MS-Stable Tumor Mutation Burden TMB-Intermediate; 11 Muts/Mb Additional Disease-relevant Genes with No Reportable Alterations Identified? EGFR ALK BRAF MET RET ERBB2 ROS1   PDL1 expression 5%.  PRIOR THERAPY: Status post wedge resection of the right upper lobe on 10/02/2016 under the care of Dr. Roxan Hockey.  CURRENT THERAPY: Observation.  INTERVAL HISTORY: Julie Gill 78 y.o. female returns to the clinic today for 6 months follow-up visit.  The patient is feeling fine today except for seasonal allergy.  She denied having any chest pain, shortness of breath, cough or hemoptysis.  She denied having any fever or chills.  She has no nausea, vomiting, diarrhea or constipation.  She denied having any headache or visual changes.  The patient had repeat CT scan of the chest performed recently and she is here for evaluation and discussion of her scan results.  MEDICAL HISTORY: Past Medical History:  Diagnosis Date  . Anxiety   . Arthritis   . Asthma    dx 10 yrs ago  . Atrial fibrillation (HCC)    CHADS VASC score 3. Intolerant to flecainide. s/p afib ablation 02/12/12  . Colon polyps   . Diastolic dysfunction   . Dysrhythmia    a fib   dx 2014  . GERD (gastroesophageal reflux disease)   . History of kidney infection     last one was approx 2009  . Hyperlipidemia   . Hypertension   . Mild sleep apnea    wears NO equipment  . Mitral regurgitation    mild  . Neuromuscular disorder ([place])   . Obesity   . Plantar  fasciitis   . Restless leg syndrome   . Sinus bradycardia   . Sleep apnea   . Stress bladder incontinence, female    WEARS SMALL PADS    ALLERGIES:  is allergic to codeine and prednisone.  MEDICATIONS:  Current Outpatient Medications  Medication Sig Dispense Refill  . calcium carbonate (OS-CAL) 600 MG TABS Take 600 mg by mouth daily.     . Cholecalciferol (VITAMIN D PO) Take 4,000 Units by mouth daily.      . clonazePAM (KLONOPIN) 0.5 MG tablet Take 0.25 mg by mouth 2 (two) times daily as needed for anxiety.     . fish oil-omega-3 fatty acids 1000 MG capsule Take 1 g by mouth 2 (two) times daily.     . hydrochlorothiazide (MICROZIDE) 12.5 MG capsule Take 12.5 mg by mouth daily.    Marland Kitchen lisinopril (ZESTRIL) 20 MG tablet Take 20 mg by mouth daily.    Marland Kitchen lovastatin (MEVACOR) 40 MG tablet Take 80 mg by mouth daily.     Marland Kitchen MAGNESIUM PO Take 250 mg by mouth daily.    . potassium chloride SA (K-DUR,KLOR-CON) 20 MEQ tablet Take 20 mEq by mouth daily.    Marland Kitchen senna (SENOKOT) 8.6 MG tablet Take 1 tablet by mouth daily.     . vitamin E (VITAMIN E) 400 UNIT capsule Take 400 Units by mouth daily.     Alveda Reasons 20 MG TABS tablet Take 20 mg by mouth daily with supper.     Marland Kitchen  nitroGLYCERIN (NITROSTAT) 0.4 MG SL tablet Place 1 tablet (0.4 mg total) under the tongue every 5 (five) minutes as needed for chest pain. (Patient not taking: Reported on 04/13/2019) 25 tablet 3   No current facility-administered medications for this visit.     SURGICAL HISTORY:  Past Surgical History:  Procedure Laterality Date  . ABDOMINAL HYSTERECTOMY  1992   BSO  . APPENDECTOMY    . atrial fibrillation ablation  02/12/12   PVI by Dr Rayann Heman  . ATRIAL FIBRILLATION ABLATION N/A 02/12/2012   Procedure: ATRIAL FIBRILLATION ABLATION;  Surgeon: Thompson Grayer, MD;  Location: [facility] C[facility];  Service: Cardiovascular;  Laterality: N/A;  . BLADDER SUSPENSION    . [place], 200,2004, 2007, 05/06/2011   adenomas  .  heels spurs     x 2  . TEE WITHOUT CARDIOVERSION  02/11/2012   Procedure: TRANSESOPHAGEAL ECHOCARDIOGRAM (TEE);  Surgeon: Larey Dresser, MD;  Location: [facility];  Service: Cardiovascular;  Laterality: N/A;  . TONSILLECTOMY AND ADENOIDECTOMY    . VARICOSE VEIN SURGERY    . VIDEO ASSISTED THORACOSCOPY (VATS)/WEDGE RESECTION Right 10/02/2016   Procedure: VIDEO ASSISTED THORACOSCOPY (VATS)/WEDGE RESECTION;  Surgeon: [name], MD;  Location: [facility];  Service: Thoracic;  Laterality: Right;    REVIEW OF SYSTEMS:  A comprehensive review of systems was negative.   PHYSICAL EXAMINATION: General appearance: alert, cooperative and no distress Head: Normocephalic, without obvious abnormality, atraumatic Neck: no adenopathy, no JVD, supple, symmetrical, trachea midline and thyroid not enlarged, symmetric, no tenderness/mass/nodules Lymph nodes: Cervical, supraclavicular, and axillary nodes normal. Resp: clear to auscultation bilaterally Back: symmetric, no curvature. ROM normal. No CVA tenderness. Cardio: regular rate and rhythm, S1, S2 normal, no murmur, click, rub or gallop GI: soft, non-tender; bowel sounds normal; no masses,  no organomegaly Extremities: extremities normal, atraumatic, no cyanosis or edema  ECOG PERFORMANCE STATUS: 1 - Symptomatic but completely ambulatory  Blood pressure (!) 147/48, pulse 61, temperature 98.4 F (36.9 C), temperature source Oral, resp. rate 20, height 5' 1.5" (1.562 m), weight 164 lb 12.8 oz (74.8 kg), SpO2 100 %.  LABORATORY DATA: Lab Results  Component Value Date   WBC 5.2 04/11/2019   HGB 12.2 04/11/2019   HCT 39.5 04/11/2019   MCV 95.6 04/11/2019   PLT 218 04/11/2019      Chemistry      Component Value Date/Time   NA 142 04/11/2019 1003   NA 142 09/28/2017 0959   K 4.3 04/11/2019 1003   K 4.4 09/28/2017 0959   CL 106 04/11/2019 1003   CO2 29 04/11/2019 1003   CO2 28 09/28/2017 0959   BUN 18 04/11/2019 1003   BUN 21.3  09/28/2017 0959   CREATININE 0.82 04/11/2019 1003   CREATININE 0.9 09/28/2017 0959      Component Value Date/Time   CALCIUM 9.1 04/11/2019 1003   CALCIUM 9.9 09/28/2017 0959   ALKPHOS 93 04/11/2019 1003   ALKPHOS 87 09/28/2017 0959   AST 20 04/11/2019 1003   AST 24 09/28/2017 0959   ALT 25 04/11/2019 1003   ALT 24 09/28/2017 0959   BILITOT 0.3 04/11/2019 1003   BILITOT 0.33 09/28/2017 0959       RADIOGRAPHIC STUDIES: Ct Chest W Contrast  Result Date: 04/11/2019 CLINICAL DATA:  Right lung cancer. Status post right upper lobectomy. EXAM: CT CHEST WITH CONTRAST TECHNIQUE: Multidetector CT imaging of the chest was performed during intravenous contrast administration. CONTRAST:  4m OMNIPAQUE IOHEXOL 300 MG/ML  SOLN  COMPARISON:  10/12/2018 FINDINGS: Cardiovascular: Heart size appears within normal limits. No pericardial effusion. Aortic atherosclerosis. Calcification in the LAD, left circumflex coronary artery identified. Mediastinum/Nodes: Normal appearance of the thyroid gland. The trachea appears patent and is midline. Normal appearance of the esophagus. No supraclavicular or axillary adenopathy. No mediastinal or hilar adenopathy. Lungs/Pleura: No pleural effusion. Postoperative change from right upper lobe wedge resection identified. Soft tissues surrounding the suture line is again noted measuring 2.0 by 1.8 cm, image 38/7. Unchanged. Right upper lobe pulmonary nodule measures 4 mm, image 56/7. Unchanged. Faint ground-glass nodule in superior segment of right lower lobe is stable measuring 8 mm, image 58/7. Also in the right lower lobe is a stable, subtle ground-glass nodule measuring 9 mm, image 72/7. Left lower lobe ground-glass attenuating nodule measures 1 cm, image 101/7. Unchanged. 5 mm right lower lobe lung nodule is unchanged, image 74/7. Left lower lobe lung nodule is also unchanged measuring 4 mm, image 74/7. Within the left upper lobe there are several small ground-glass  attenuating nodules which are unchanged measuring up to 6 mm, image 27/7 and image 31/7. Tree-in-bud opacities with mild bronchiectasis within the right middle lobe and lingula is again noted and appears stable from the previous exam likely the sequelae of chronic indolent atypical infection such as MAI. Upper Abdomen: Calcified granulomas identified within the liver. Unchanged cyst in lateral segment of left lobe measuring 0.9 cm. No acute abdominal findings. Musculoskeletal: No chest wall abnormality. No acute or significant osseous findings. IMPRESSION: 1. Stable exam compared with 04/05/2018. The soft tissue density along the right upper lobe wedge resection suture line is unchanged in size. 2. Scattered ground-glass attenuating nodules in both lungs are all stable. Indolent multifocal adenocarcinoma not excluded. 3. Similar appearance of tree-in-bud opacities with mild bronchiectasis involving the lingula and right middle lobe is again noted. Favor chronic indolent atypical infection such as MAI. 4. Aortic atherosclerosis and coronary artery calcifications. Electronically Signed   By: Kerby Moors M.D.   On: 04/11/2019 15:53    ASSESSMENT AND PLAN:  This is a very pleasant 78 years old white female with stage IB non-small cell lung cancer, adenocarcinoma with no actionable mutations and several other groundglass opacities bilaterally.  She is status post wedge resection of the right upper lobe nodule. She is currently on observation and feeling fine. The patient had repeat CT scan of the chest performed recently.  I personally and independently reviewed the scan and discussed the results with the patient today. Her scan showed no concerning findings for disease progression. I recommended for her to continue on observation with repeat CT scan of the chest in 6 months. The patient was advised to call immediately if she has any concerning symptoms in the interval. The patient voices understanding of  current disease status and treatment options and is in agreement with the current care plan.  All questions were answered. The patient knows to call the clinic with any problems, questions or concerns. We can certainly see the patient much sooner if necessary.  Disclaimer: This note was dictated with voice recognition software. Similar sounding words can inadvertently be transcribed and may not be corrected upon review.

## 2019-04-14 ENCOUNTER — Telehealth: Payer: Self-pay | Admitting: Internal Medicine

## 2019-04-14 NOTE — Telephone Encounter (Signed)
Scheduled appt per 5/13 los - reminder letter mailed with appt date and time

## 2019-05-12 DIAGNOSIS — D508 Other iron deficiency anemias: Secondary | ICD-10-CM | POA: Diagnosis not present

## 2019-05-12 DIAGNOSIS — C3491 Malignant neoplasm of unspecified part of right bronchus or lung: Secondary | ICD-10-CM | POA: Diagnosis not present

## 2019-05-12 DIAGNOSIS — F324 Major depressive disorder, single episode, in partial remission: Secondary | ICD-10-CM | POA: Diagnosis not present

## 2019-05-12 DIAGNOSIS — I10 Essential (primary) hypertension: Secondary | ICD-10-CM | POA: Diagnosis not present

## 2019-05-12 DIAGNOSIS — I480 Paroxysmal atrial fibrillation: Secondary | ICD-10-CM | POA: Diagnosis not present

## 2019-05-12 DIAGNOSIS — J45909 Unspecified asthma, uncomplicated: Secondary | ICD-10-CM | POA: Diagnosis not present

## 2019-05-12 DIAGNOSIS — E782 Mixed hyperlipidemia: Secondary | ICD-10-CM | POA: Diagnosis not present

## 2019-07-27 DIAGNOSIS — D1801 Hemangioma of skin and subcutaneous tissue: Secondary | ICD-10-CM | POA: Diagnosis not present

## 2019-07-27 DIAGNOSIS — D225 Melanocytic nevi of trunk: Secondary | ICD-10-CM | POA: Diagnosis not present

## 2019-07-27 DIAGNOSIS — I8391 Asymptomatic varicose veins of right lower extremity: Secondary | ICD-10-CM | POA: Diagnosis not present

## 2019-07-27 DIAGNOSIS — L821 Other seborrheic keratosis: Secondary | ICD-10-CM | POA: Diagnosis not present

## 2019-07-27 DIAGNOSIS — I8392 Asymptomatic varicose veins of left lower extremity: Secondary | ICD-10-CM | POA: Diagnosis not present

## 2019-07-27 DIAGNOSIS — D2371 Other benign neoplasm of skin of right lower limb, including hip: Secondary | ICD-10-CM | POA: Diagnosis not present

## 2019-07-27 DIAGNOSIS — L814 Other melanin hyperpigmentation: Secondary | ICD-10-CM | POA: Diagnosis not present

## 2019-08-18 ENCOUNTER — Other Ambulatory Visit: Payer: Self-pay

## 2019-08-18 DIAGNOSIS — R6889 Other general symptoms and signs: Secondary | ICD-10-CM | POA: Diagnosis not present

## 2019-08-18 DIAGNOSIS — Z20822 Contact with and (suspected) exposure to covid-19: Secondary | ICD-10-CM

## 2019-08-20 LAB — NOVEL CORONAVIRUS, NAA: SARS-CoV-2, NAA: NOT DETECTED

## 2019-08-26 DIAGNOSIS — I10 Essential (primary) hypertension: Secondary | ICD-10-CM | POA: Diagnosis not present

## 2019-08-26 DIAGNOSIS — J45909 Unspecified asthma, uncomplicated: Secondary | ICD-10-CM | POA: Diagnosis not present

## 2019-08-26 DIAGNOSIS — I480 Paroxysmal atrial fibrillation: Secondary | ICD-10-CM | POA: Diagnosis not present

## 2019-08-26 DIAGNOSIS — C3491 Malignant neoplasm of unspecified part of right bronchus or lung: Secondary | ICD-10-CM | POA: Diagnosis not present

## 2019-08-26 DIAGNOSIS — E782 Mixed hyperlipidemia: Secondary | ICD-10-CM | POA: Diagnosis not present

## 2019-08-26 DIAGNOSIS — D508 Other iron deficiency anemias: Secondary | ICD-10-CM | POA: Diagnosis not present

## 2019-08-26 DIAGNOSIS — F324 Major depressive disorder, single episode, in partial remission: Secondary | ICD-10-CM | POA: Diagnosis not present

## 2019-09-02 ENCOUNTER — Telehealth: Payer: Self-pay | Admitting: Internal Medicine

## 2019-09-02 NOTE — Telephone Encounter (Signed)
STAT if HR is under 50 or over 120 (normal HR is 60-100 beats per minute)  1) What is your heart rate? 66  2) Do you have a log of your heart rate readings (document readings)? no  3) Do you have any other symptoms? This morning after she got up to go to the bathroom and laid back down she states she felt her heart just racing as if someone had just scared her.  She states her HR was at 102.

## 2019-09-02 NOTE — Telephone Encounter (Signed)
Returned call to Pt.  Pt had short episode of afib (less than 5 minutes per Pt report).  She states it scared her because it happens so infrequently.  Provided reassurance.  Advised to call office if she has an episode of afib that lasts an hour or longer or is accompanied by chest pain or sob.  Pt indicates understanding.  Pt reassured by phone call.  Advised Pt she is overdue for her yearly appt with Dr. Rayann Heman.  Advised I would send to scheduling to give her a call.  Pt thanked nurse for call.

## 2019-09-15 DIAGNOSIS — I480 Paroxysmal atrial fibrillation: Secondary | ICD-10-CM | POA: Diagnosis not present

## 2019-09-15 DIAGNOSIS — D508 Other iron deficiency anemias: Secondary | ICD-10-CM | POA: Diagnosis not present

## 2019-09-15 DIAGNOSIS — E782 Mixed hyperlipidemia: Secondary | ICD-10-CM | POA: Diagnosis not present

## 2019-09-15 DIAGNOSIS — J45909 Unspecified asthma, uncomplicated: Secondary | ICD-10-CM | POA: Diagnosis not present

## 2019-09-15 DIAGNOSIS — I10 Essential (primary) hypertension: Secondary | ICD-10-CM | POA: Diagnosis not present

## 2019-09-15 DIAGNOSIS — C3491 Malignant neoplasm of unspecified part of right bronchus or lung: Secondary | ICD-10-CM | POA: Diagnosis not present

## 2019-09-15 DIAGNOSIS — F324 Major depressive disorder, single episode, in partial remission: Secondary | ICD-10-CM | POA: Diagnosis not present

## 2019-09-21 ENCOUNTER — Other Ambulatory Visit: Payer: Self-pay

## 2019-09-21 ENCOUNTER — Encounter: Payer: Self-pay | Admitting: Internal Medicine

## 2019-09-21 ENCOUNTER — Telehealth (INDEPENDENT_AMBULATORY_CARE_PROVIDER_SITE_OTHER): Payer: PPO | Admitting: Internal Medicine

## 2019-09-21 VITALS — BP 151/63 | HR 49 | Ht 61.5 in | Wt 160.0 lb

## 2019-09-21 DIAGNOSIS — D6869 Other thrombophilia: Secondary | ICD-10-CM

## 2019-09-21 DIAGNOSIS — I480 Paroxysmal atrial fibrillation: Secondary | ICD-10-CM | POA: Diagnosis not present

## 2019-09-21 DIAGNOSIS — I10 Essential (primary) hypertension: Secondary | ICD-10-CM

## 2019-09-21 NOTE — Progress Notes (Signed)
Electrophysiology TeleHealth Note   Due to national recommendations of social distancing due to [name], an audio telehealth visit is felt to be most appropriate for this patient at this time.  Verbal consent was obtained by me for the telehealth visit today.  The patient does not have capability for a virtual visit.  A phone visit is therefore required today.   Date:  09/21/2019   ID:  Julie Gill, DOB 07-Nov-1941, MRN [id number]  Location: patient's home  Provider location:  [facility]  Evaluation Performed: Follow-up visit  PCP:  [name], MD   Electrophysiologist:  Dr Rayann Heman  Chief Complaint:  palpitations  History of Present Illness:    Julie Gill is a 78 y.o. female who presents via telehealth conferencing today.  Since last being seen in our clinic, the patient reports doing very well.  Today, she denies symptoms of palpitations, chest pain, shortness of breath,  lower extremity edema, dizziness, presyncope, or syncope.  The patient is otherwise without complaint today.  The patient denies symptoms of fevers, chills, cough, or new SOB worrisome for COVID 19.  Past Medical History:  Diagnosis Date  . Anxiety   . Arthritis   . Asthma    dx 10 yrs ago  . Atrial fibrillation (HCC)    CHADS VASC score 3. Intolerant to flecainide. s/p afib ablation 02/12/12  . Colon polyps   . Diastolic dysfunction   . Dysrhythmia    a fib   dx 2014  . GERD (gastroesophageal reflux disease)   . History of kidney infection     last one was approx 2009  . Hyperlipidemia   . Hypertension   . Mild sleep apnea    wears NO equipment  . Mitral regurgitation    mild  . Neuromuscular disorder ([place])   . Obesity   . Plantar fasciitis   . Restless leg syndrome   . Sinus bradycardia   . Sleep apnea   . Stress bladder incontinence, female    WEARS SMALL PADS    Past Surgical History:  Procedure Laterality Date  . ABDOMINAL HYSTERECTOMY  1992   BSO  . APPENDECTOMY     . atrial fibrillation ablation  02/12/12   PVI by Dr Rayann Heman  . ATRIAL FIBRILLATION ABLATION N/A 02/12/2012   Procedure: ATRIAL FIBRILLATION ABLATION;  Surgeon: Thompson Grayer, MD;  Location: [facility] CATH LAB;  Service: Cardiovascular;  Laterality: N/A;  . BLADDER SUSPENSION    . [place], 200,2004, 2007, 05/06/2011   adenomas  . heels spurs     x 2  . TEE WITHOUT CARDIOVERSION  02/11/2012   Procedure: TRANSESOPHAGEAL ECHOCARDIOGRAM (TEE);  Surgeon: Larey Dresser, MD;  Location: [facility];  Service: Cardiovascular;  Laterality: N/A;  . TONSILLECTOMY AND ADENOIDECTOMY    . VARICOSE VEIN SURGERY    . VIDEO ASSISTED THORACOSCOPY (VATS)/WEDGE RESECTION Right 10/02/2016   Procedure: VIDEO ASSISTED THORACOSCOPY (VATS)/WEDGE RESECTION;  Surgeon: [name], MD;  Location: [facility];  Service: Thoracic;  Laterality: Right;    Current Outpatient Medications  Medication Sig Dispense Refill  . calcium carbonate (OS-CAL) 600 MG TABS Take 600 mg by mouth daily.     . Cholecalciferol (VITAMIN D PO) Take 4,000 Units by mouth daily.      . clonazePAM (KLONOPIN) 0.5 MG tablet Take 0.25 mg by mouth 2 (two) times daily as needed for anxiety.     . fish oil-omega-3 fatty acids 1000 MG capsule  Take 1 g by mouth 2 (two) times daily.     . hydrochlorothiazide (MICROZIDE) 12.5 MG capsule Take 12.5 mg by mouth daily.    Marland Kitchen lisinopril (ZESTRIL) 20 MG tablet Take 20 mg by mouth daily.    Marland Kitchen lovastatin (MEVACOR) 40 MG tablet Take 80 mg by mouth daily.     Marland Kitchen MAGNESIUM PO Take 250 mg by mouth daily.    . nitroGLYCERIN (NITROSTAT) 0.4 MG SL tablet Place 1 tablet (0.4 mg total) under the tongue every 5 (five) minutes as needed for chest pain. 25 tablet 3  . potassium chloride SA (K-DUR,KLOR-CON) 20 MEQ tablet Take 20 mEq by mouth daily.    Marland Kitchen senna (SENOKOT) 8.6 MG tablet Take 1 tablet by mouth daily.     . vitamin E (VITAMIN E) 400 UNIT capsule Take 400 Units by mouth daily.     Alveda Reasons 20 MG  TABS tablet Take 20 mg by mouth daily with supper.      No current facility-administered medications for this visit.     Allergies:   Codeine and Prednisone   Social History:  The patient  reports that she quit smoking about 34 years ago. Her smoking use included cigarettes. She has never used smokeless tobacco. She reports that she does not drink alcohol or use drugs.   Family History:  The patient's  family history includes Colon cancer in her maternal grandmother and maternal uncle; Congestive Heart Failure in her mother; Diabetes in her brother; Hypertension in her brother; Lung cancer in her father.   ROS:  Please see the history of present illness.   All other systems are personally reviewed and negative.    Exam:    Vital Signs:  BP (!) 151/63   Pulse (!) 49   Ht 5' 1.5" (1.562 m)   Wt 160 lb (72.6 kg)   SpO2 99%   BMI 29.74 kg/m   Well sounding and appearing, alert and conversant, regular work of breathing   Labs/Other Tests and Data Reviewed:    Recent Labs: 04/11/2019: ALT 25; BUN 18; Creatinine 0.82; Hemoglobin 12.2; Platelet Count 218; Potassium 4.3; Sodium 142   Wt Readings from Last 3 Encounters:  09/21/19 160 lb (72.6 kg)  04/13/19 164 lb 12.8 oz (74.8 kg)  03/28/19 161 lb (73 kg)     ASSESSMENT & PLAN:    1.  Paroxysmal atrial fibrillation Continue Xarelto for CHADS2VASC of 4 Doing well off AAD therapy  2.  HTN Stable No change required today  3.  Obesity Body mass index is 29.74 kg/m. Weight loss encouraged   Follow-up:  With me in 1 year    Patient Risk:  after full review of this patients clinical status, I feel that they are at moderate risk at this time.  Today, I have spent 15 minutes with the patient with telehealth technology discussing arrhythmia management .    Army Fossa, MD  09/21/2019 11:19 AM     [facility] [address] [place] [address] [phone] (office) [phone] (fax)

## 2019-09-26 DIAGNOSIS — R911 Solitary pulmonary nodule: Secondary | ICD-10-CM | POA: Diagnosis not present

## 2019-09-26 DIAGNOSIS — I10 Essential (primary) hypertension: Secondary | ICD-10-CM | POA: Diagnosis not present

## 2019-09-26 DIAGNOSIS — G4730 Sleep apnea, unspecified: Secondary | ICD-10-CM | POA: Diagnosis not present

## 2019-09-26 DIAGNOSIS — F419 Anxiety disorder, unspecified: Secondary | ICD-10-CM | POA: Diagnosis not present

## 2019-09-26 DIAGNOSIS — E782 Mixed hyperlipidemia: Secondary | ICD-10-CM | POA: Diagnosis not present

## 2019-09-26 DIAGNOSIS — G2581 Restless legs syndrome: Secondary | ICD-10-CM | POA: Diagnosis not present

## 2019-09-26 DIAGNOSIS — I480 Paroxysmal atrial fibrillation: Secondary | ICD-10-CM | POA: Diagnosis not present

## 2019-09-26 DIAGNOSIS — J309 Allergic rhinitis, unspecified: Secondary | ICD-10-CM | POA: Diagnosis not present

## 2019-09-26 DIAGNOSIS — K219 Gastro-esophageal reflux disease without esophagitis: Secondary | ICD-10-CM | POA: Diagnosis not present

## 2019-09-26 DIAGNOSIS — Z7901 Long term (current) use of anticoagulants: Secondary | ICD-10-CM | POA: Diagnosis not present

## 2019-09-26 DIAGNOSIS — C3491 Malignant neoplasm of unspecified part of right bronchus or lung: Secondary | ICD-10-CM | POA: Diagnosis not present

## 2019-09-26 DIAGNOSIS — R7301 Impaired fasting glucose: Secondary | ICD-10-CM | POA: Diagnosis not present

## 2019-09-26 DIAGNOSIS — Z0000 Encounter for general adult medical examination without abnormal findings: Secondary | ICD-10-CM | POA: Diagnosis not present

## 2019-10-07 DIAGNOSIS — K219 Gastro-esophageal reflux disease without esophagitis: Secondary | ICD-10-CM | POA: Diagnosis not present

## 2019-10-07 DIAGNOSIS — R7301 Impaired fasting glucose: Secondary | ICD-10-CM | POA: Diagnosis not present

## 2019-10-07 DIAGNOSIS — E782 Mixed hyperlipidemia: Secondary | ICD-10-CM | POA: Diagnosis not present

## 2019-10-07 DIAGNOSIS — G2581 Restless legs syndrome: Secondary | ICD-10-CM | POA: Diagnosis not present

## 2019-10-07 DIAGNOSIS — I10 Essential (primary) hypertension: Secondary | ICD-10-CM | POA: Diagnosis not present

## 2019-10-07 DIAGNOSIS — F419 Anxiety disorder, unspecified: Secondary | ICD-10-CM | POA: Diagnosis not present

## 2019-10-07 DIAGNOSIS — J309 Allergic rhinitis, unspecified: Secondary | ICD-10-CM | POA: Diagnosis not present

## 2019-10-07 DIAGNOSIS — C3491 Malignant neoplasm of unspecified part of right bronchus or lung: Secondary | ICD-10-CM | POA: Diagnosis not present

## 2019-10-07 DIAGNOSIS — Z79899 Other long term (current) drug therapy: Secondary | ICD-10-CM | POA: Diagnosis not present

## 2019-10-07 DIAGNOSIS — I480 Paroxysmal atrial fibrillation: Secondary | ICD-10-CM | POA: Diagnosis not present

## 2019-10-17 ENCOUNTER — Inpatient Hospital Stay: Payer: PPO | Attending: Internal Medicine

## 2019-10-17 ENCOUNTER — Encounter (HOSPITAL_COMMUNITY): Payer: Self-pay

## 2019-10-17 ENCOUNTER — Other Ambulatory Visit: Payer: Self-pay

## 2019-10-17 ENCOUNTER — Ambulatory Visit (HOSPITAL_COMMUNITY)
Admission: RE | Admit: 2019-10-17 | Discharge: 2019-10-17 | Disposition: A | Discharge status: Home / Self Care | Payer: PPO | Source: Ambulatory Visit | Attending: Internal Medicine | Admitting: Internal Medicine

## 2019-10-17 DIAGNOSIS — I10 Essential (primary) hypertension: Secondary | ICD-10-CM | POA: Diagnosis not present

## 2019-10-17 DIAGNOSIS — C3490 Malignant neoplasm of unspecified part of unspecified bronchus or lung: Secondary | ICD-10-CM

## 2019-10-17 DIAGNOSIS — K219 Gastro-esophageal reflux disease without esophagitis: Secondary | ICD-10-CM | POA: Diagnosis not present

## 2019-10-17 DIAGNOSIS — I700 Atherosclerosis of aorta: Secondary | ICD-10-CM | POA: Diagnosis not present

## 2019-10-17 DIAGNOSIS — F419 Anxiety disorder, unspecified: Secondary | ICD-10-CM | POA: Insufficient documentation

## 2019-10-17 DIAGNOSIS — I4891 Unspecified atrial fibrillation: Secondary | ICD-10-CM | POA: Insufficient documentation

## 2019-10-17 DIAGNOSIS — G4730 Sleep apnea, unspecified: Secondary | ICD-10-CM | POA: Insufficient documentation

## 2019-10-17 DIAGNOSIS — Z79899 Other long term (current) drug therapy: Secondary | ICD-10-CM | POA: Insufficient documentation

## 2019-10-17 DIAGNOSIS — E785 Hyperlipidemia, unspecified: Secondary | ICD-10-CM | POA: Insufficient documentation

## 2019-10-17 DIAGNOSIS — C3491 Malignant neoplasm of unspecified part of right bronchus or lung: Secondary | ICD-10-CM | POA: Diagnosis not present

## 2019-10-17 DIAGNOSIS — G2581 Restless legs syndrome: Secondary | ICD-10-CM | POA: Insufficient documentation

## 2019-10-17 DIAGNOSIS — Z7901 Long term (current) use of anticoagulants: Secondary | ICD-10-CM | POA: Diagnosis not present

## 2019-10-17 HISTORY — DX: Malignant (primary) neoplasm, unspecified: C80.1

## 2019-10-17 LAB — CMP (CANCER CENTER ONLY)
ALT: 24 U/L (ref 0–44)
AST: 22 U/L (ref 15–41)
Albumin: 3.9 g/dL (ref 3.5–5.0)
Alkaline Phosphatase: 95 U/L (ref 38–126)
Anion gap: 9 (ref 5–15)
BUN: 15 mg/dL (ref 8–23)
CO2: 29 mmol/L (ref 22–32)
Calcium: 9.5 mg/dL (ref 8.9–10.3)
Chloride: 105 mmol/L (ref 98–111)
Creatinine: 0.81 mg/dL (ref 0.44–1.00)
GFR, Est AFR Am: >60 mL/min (ref >60)
GFR, Estimated: >60 mL/min (ref >60)
Glucose, Bld: 101 mg/dL — ABNORMAL HIGH (ref 70–99)
Potassium: 4.8 mmol/L (ref 3.5–5.1)
Sodium: 143 mmol/L (ref 135–145)
Total Bilirubin: 0.3 mg/dL (ref 0.3–1.2)
Total Protein: 6.5 g/dL (ref 6.5–8.1)

## 2019-10-17 LAB — CBC WITH DIFFERENTIAL (CANCER CENTER ONLY)
Abs Immature Granulocytes: 0.01 10*3/uL (ref 0.00–0.07)
Basophils Absolute: 0 10*3/uL (ref 0.0–0.1)
Basophils Relative: 0 %
Eosinophils Absolute: 0.1 10*3/uL (ref 0.0–0.5)
Eosinophils Relative: 2 %
HCT: 38.7 % (ref 36.0–46.0)
Hemoglobin: 12.3 g/dL (ref 12.0–15.0)
Immature Granulocytes: 0 %
Lymphocytes Relative: 36 %
Lymphs Abs: 2 10*3/uL (ref 0.7–4.0)
MCH: 29.5 pg (ref 26.0–34.0)
MCHC: 31.8 g/dL (ref 30.0–36.0)
MCV: 92.8 fL (ref 80.0–100.0)
Monocytes Absolute: 0.5 10*3/uL (ref 0.1–1.0)
Monocytes Relative: 10 %
Neutro Abs: 2.9 10*3/uL (ref 1.7–7.7)
Neutrophils Relative %: 52 %
Platelet Count: 215 10*3/uL (ref 150–400)
RBC: 4.17 MIL/uL (ref 3.87–5.11)
RDW: 12.6 % (ref 11.5–15.5)
WBC Count: 5.5 10*3/uL (ref 4.0–10.5)
nRBC: 0 % (ref 0.0–0.2)

## 2019-10-17 MED ORDER — SODIUM CHLORIDE (PF) 0.9 % IJ SOLN
INTRAMUSCULAR | Status: AC
  Filled 2019-10-17: qty 50

## 2019-10-17 MED ORDER — IOHEXOL 300 MG/ML  SOLN
75.0000 mL | Freq: Once | INTRAMUSCULAR | Status: AC | PRN
  Administered 2019-10-17: 11:00:00 75 mL via INTRAVENOUS

## 2019-10-19 ENCOUNTER — Inpatient Hospital Stay (HOSPITAL_BASED_OUTPATIENT_CLINIC_OR_DEPARTMENT_OTHER): Payer: PPO | Admitting: Internal Medicine

## 2019-10-19 ENCOUNTER — Other Ambulatory Visit: Payer: Self-pay

## 2019-10-19 VITALS — BP 141/44 | HR 56 | Temp 98.0°F | Resp 20 | Ht 61.5 in | Wt 171.1 lb

## 2019-10-19 DIAGNOSIS — I10 Essential (primary) hypertension: Secondary | ICD-10-CM

## 2019-10-19 DIAGNOSIS — C3490 Malignant neoplasm of unspecified part of unspecified bronchus or lung: Secondary | ICD-10-CM

## 2019-10-19 DIAGNOSIS — C3491 Malignant neoplasm of unspecified part of right bronchus or lung: Secondary | ICD-10-CM

## 2019-10-19 NOTE — Progress Notes (Signed)
[place] Telephone:([phone]) [phone]   Fax:(336) [phone]  OFFICE PROGRESS NOTE  Julie Gill, [place] [address]  DIAGNOSIS: Stage IB (T1c, N0, M0) non-small cell lung cancer, adenocarcinoma but the patient also has multifocal groundglass opacities in the lung bilaterally highly concerning for low-grade adenocarcinoma as well.  Genomic Alteration Identified? KRAS G12V Additional Findings? Microsatellite status MS-Stable Tumor Mutation Burden TMB-Intermediate; 11 Muts/Mb Additional Disease-relevant Genes with No Reportable Alterations Identified? EGFR ALK BRAF MET RET ERBB2 ROS1   PDL1 expression 5%.  PRIOR THERAPY: Status post wedge resection of the right upper lobe on 10/02/2016 under the care of Dr. Roxan Hockey.  CURRENT THERAPY: Observation.  INTERVAL HISTORY: Julie Gill 78 y.o. female returns to the clinic today for follow-up visit.  The patient is feeling fine today with no concerning complaints.  She denied having any chest pain, shortness of breath, cough or hemoptysis.  She denied having any fever or chills.  She has no nausea, vomiting, diarrhea or constipation.  She has no headache or visual changes.  She has no recent weight loss or night sweats.  The patient had repeat CT scan of the chest performed recently and she is here for evaluation and discussion of her scan results.  MEDICAL HISTORY: Past Medical History:  Diagnosis Date  . Anxiety   . Arthritis   . Asthma    dx 10 yrs ago  . Atrial fibrillation (HCC)    CHADS VASC score 3. Intolerant to flecainide. s/p afib ablation 02/12/12  . Colon polyps   . Diastolic dysfunction   . Dysrhythmia    a fib   dx 2014  . GERD (gastroesophageal reflux disease)   . History of kidney infection     last one was approx 2009  . Hyperlipidemia   . Hypertension   . lung ca dx'd 08/2016  . Mild sleep apnea    wears NO equipment  . Mitral regurgitation    mild  .  Neuromuscular disorder ([place])   . Obesity   . Plantar fasciitis   . Restless leg syndrome   . Sinus bradycardia   . Sleep apnea   . Stress bladder incontinence, female    WEARS SMALL PADS    ALLERGIES:  is allergic to codeine and prednisone.  MEDICATIONS:  Current Outpatient Medications  Medication Sig Dispense Refill  . calcium carbonate (OS-CAL) 600 MG TABS Take 600 mg by mouth daily.     . Cholecalciferol (VITAMIN D PO) Take 4,000 Units by mouth daily.      . clonazePAM (KLONOPIN) 0.5 MG tablet Take 0.25 mg by mouth 2 (two) times daily as needed for anxiety.     . fish oil-omega-3 fatty acids 1000 MG capsule Take 1 g by mouth 2 (two) times daily.     . hydrochlorothiazide (MICROZIDE) 12.5 MG capsule Take 12.5 mg by mouth daily.    Marland Kitchen lisinopril (ZESTRIL) 20 MG tablet Take 20 mg by mouth daily.    Marland Kitchen lovastatin (MEVACOR) 40 MG tablet Take 80 mg by mouth daily.     Marland Kitchen MAGNESIUM PO Take 250 mg by mouth daily.    . nitroGLYCERIN (NITROSTAT) 0.4 MG SL tablet Place 1 tablet (0.4 mg total) under the tongue every 5 (five) minutes as needed for chest pain. 25 tablet 3  . potassium chloride SA (K-DUR,KLOR-CON) 20 MEQ tablet Take 20 mEq by mouth daily.    Marland Kitchen senna (SENOKOT) 8.6 MG tablet Take 1  tablet by mouth daily.     . vitamin E (VITAMIN E) 400 UNIT capsule Take 400 Units by mouth daily.     Alveda Reasons 20 MG TABS tablet Take 20 mg by mouth daily with supper.      No current facility-administered medications for this visit.     SURGICAL HISTORY:  Past Surgical History:  Procedure Laterality Date  . ABDOMINAL HYSTERECTOMY  1992   BSO  . APPENDECTOMY    . atrial fibrillation ablation  02/12/12   PVI by Dr Rayann Heman  . ATRIAL FIBRILLATION ABLATION N/A 02/12/2012   Procedure: ATRIAL FIBRILLATION ABLATION;  Surgeon: Thompson Grayer, MD;  Location: [facility] CATH LAB;  Service: Cardiovascular;  Laterality: N/A;  . BLADDER SUSPENSION    . [place], 200,2004, 2007, 05/06/2011    adenomas  . heels spurs     x 2  . TEE WITHOUT CARDIOVERSION  02/11/2012   Procedure: TRANSESOPHAGEAL ECHOCARDIOGRAM (TEE);  Surgeon: Larey Dresser, MD;  Location: [facility];  Service: Cardiovascular;  Laterality: N/A;  . TONSILLECTOMY AND ADENOIDECTOMY    . VARICOSE VEIN SURGERY    . VIDEO ASSISTED THORACOSCOPY (VATS)/WEDGE RESECTION Right 10/02/2016   Procedure: VIDEO ASSISTED THORACOSCOPY (VATS)/WEDGE RESECTION;  Surgeon: [name], MD;  Location: [facility];  Service: Thoracic;  Laterality: Right;    REVIEW OF SYSTEMS:  A comprehensive review of systems was negative.   PHYSICAL EXAMINATION: General appearance: alert, cooperative and no distress Head: Normocephalic, without obvious abnormality, atraumatic Neck: no adenopathy, no JVD, supple, symmetrical, trachea midline and thyroid not enlarged, symmetric, no tenderness/mass/nodules Lymph nodes: Cervical, supraclavicular, and axillary nodes normal. Resp: clear to auscultation bilaterally Back: symmetric, no curvature. ROM normal. No CVA tenderness. Cardio: regular rate and rhythm, S1, S2 normal, no murmur, click, rub or gallop GI: soft, non-tender; bowel sounds normal; no masses,  no organomegaly Extremities: extremities normal, atraumatic, no cyanosis or edema  ECOG PERFORMANCE STATUS: 1 - Symptomatic but completely ambulatory  Blood pressure (!) 141/44, pulse (!) 56, temperature 98 F (36.7 C), temperature source Oral, resp. rate 20, height 5' 1.5" (1.562 m), weight 171 lb 1.6 oz (77.6 kg), SpO2 99 %.  LABORATORY DATA: Lab Results  Component Value Date   WBC 5.5 10/17/2019   HGB 12.3 10/17/2019   HCT 38.7 10/17/2019   MCV 92.8 10/17/2019   PLT 215 10/17/2019      Chemistry      Component Value Date/Time   NA 143 10/17/2019 0909   NA 142 09/28/2017 0959   K 4.8 10/17/2019 0909   K 4.4 09/28/2017 0959   CL 105 10/17/2019 0909   CO2 29 10/17/2019 0909   CO2 28 09/28/2017 0959   BUN 15 10/17/2019 0909   BUN  21.3 09/28/2017 0959   CREATININE 0.81 10/17/2019 0909   CREATININE 0.9 09/28/2017 0959      Component Value Date/Time   CALCIUM 9.5 10/17/2019 0909   CALCIUM 9.9 09/28/2017 0959   ALKPHOS 95 10/17/2019 0909   ALKPHOS 87 09/28/2017 0959   AST 22 10/17/2019 0909   AST 24 09/28/2017 0959   ALT 24 10/17/2019 0909   ALT 24 09/28/2017 0959   BILITOT 0.3 10/17/2019 0909   BILITOT 0.33 09/28/2017 0959       RADIOGRAPHIC STUDIES: Ct Chest W Contrast  Result Date: 10/17/2019 CLINICAL DATA:  Lung cancer. Restaging. EXAM: CT CHEST WITH CONTRAST TECHNIQUE: Multidetector CT imaging of the chest was performed during intravenous contrast administration. CONTRAST:  51m  OMNIPAQUE IOHEXOL 300 MG/ML  SOLN COMPARISON:  04/11/2019 FINDINGS: Cardiovascular: The heart size appears within normal limits. Aortic atherosclerosis. Lad and left circumflex coronary artery calcifications noted. Mediastinum/Nodes: No enlarged mediastinal, hilar, or axillary lymph nodes. Thyroid gland, trachea, and esophagus demonstrate no significant findings. Small hiatal hernia noted. Lungs/Pleura: Status post right upper lobe wedge resection. Soft tissue surrounding the suture line is again noted and appears unchanged from previous examination. Scattered solid nodules and ground-glass nodules are again noted in both lungs: Index 7 mm pure ground-glass nodule in superior segment of right lower lobe is unchanged, image 52/7. index ground-glass nodule in the right lower lobe is stable measuring 7 mm, image 66/7. Index right lower lobe ground-glass nodule measures 1 cm, image 96/7. Also unchanged. Index left upper lobe ground-glass nodule is unchanged measuring 5 mm, image 23/7. Index left upper lobe ground-glass nodule is stable measuring 6 mm, image 27/7. Index solid nodule within the subpleural left lower lobe is unchanged measuring 4 mm, image 105/7. Stable index nodule within superior segment of left lower lobe measuring 5 mm, image  70/7. No new or enlarging suspicious nodules. Upper Abdomen: Left lobe of liver cyst is unchanged. No acute abnormality. Musculoskeletal: Multi level spondylosis identified within the thoracic spine. No chest wall abnormality. No acute or significant osseous findings. IMPRESSION: 1. Stable appearance of the chest status post right upper lobe wedge resection. Soft tissue surrounding the suture line is unchanged from previous exam and is favored to represent postsurgical change. 2. Stable appearance of scattered solid nodules and ground-glass nodules in both lungs. 3. Aortic atherosclerosis. Multi vessel coronary artery calcifications noted. 4. Small hiatal hernia. Aortic Atherosclerosis (ICD10-I70.0). Electronically Signed   By: Kerby Moors M.D.   On: 10/17/2019 12:52    ASSESSMENT AND PLAN:  This is a very pleasant 78 years old white female with stage IB non-small cell lung cancer, adenocarcinoma with no actionable mutations and several other groundglass opacities bilaterally.  She is status post wedge resection of the right upper lobe nodule. The patient has been on observation for several years now with no concerning complaints. She had repeat CT scan of the chest performed recently.  I personally and independently reviewed the scans and discussed the results with the patient today. Her scan showed no concerning findings for disease recurrence or progression. I recommended for the patient to continue on observation with repeat CT scan of the chest in 1 year. For the hypertension, she will continue with her current blood pressure medication and consult with her primary care physician for adjustment of her medication if needed. The patient was advised to call immediately if she has any concerning symptoms in the interval. The patient voices understanding of current disease status and treatment options and is in agreement with the current care plan.  All questions were answered. The patient knows to  call the clinic with any problems, questions or concerns. We can certainly see the patient much sooner if necessary.  Disclaimer: This note was dictated with voice recognition software. Similar sounding words can inadvertently be transcribed and may not be corrected upon review.

## 2019-10-20 ENCOUNTER — Telehealth: Payer: Self-pay | Admitting: Internal Medicine

## 2019-10-20 NOTE — Telephone Encounter (Signed)
Scheduled per los. Called and left msg. Mailed printout  °

## 2019-11-18 DIAGNOSIS — R200 Anesthesia of skin: Secondary | ICD-10-CM | POA: Diagnosis not present

## 2019-12-01 DIAGNOSIS — C3491 Malignant neoplasm of unspecified part of right bronchus or lung: Secondary | ICD-10-CM | POA: Diagnosis not present

## 2019-12-01 DIAGNOSIS — D508 Other iron deficiency anemias: Secondary | ICD-10-CM | POA: Diagnosis not present

## 2019-12-01 DIAGNOSIS — J45909 Unspecified asthma, uncomplicated: Secondary | ICD-10-CM | POA: Diagnosis not present

## 2019-12-01 DIAGNOSIS — F324 Major depressive disorder, single episode, in partial remission: Secondary | ICD-10-CM | POA: Diagnosis not present

## 2019-12-01 DIAGNOSIS — I10 Essential (primary) hypertension: Secondary | ICD-10-CM | POA: Diagnosis not present

## 2019-12-01 DIAGNOSIS — E782 Mixed hyperlipidemia: Secondary | ICD-10-CM | POA: Diagnosis not present

## 2019-12-01 DIAGNOSIS — I480 Paroxysmal atrial fibrillation: Secondary | ICD-10-CM | POA: Diagnosis not present

## 2019-12-12 DIAGNOSIS — E782 Mixed hyperlipidemia: Secondary | ICD-10-CM | POA: Diagnosis not present

## 2019-12-12 DIAGNOSIS — C3491 Malignant neoplasm of unspecified part of right bronchus or lung: Secondary | ICD-10-CM | POA: Diagnosis not present

## 2019-12-12 DIAGNOSIS — I10 Essential (primary) hypertension: Secondary | ICD-10-CM | POA: Diagnosis not present

## 2019-12-12 DIAGNOSIS — D508 Other iron deficiency anemias: Secondary | ICD-10-CM | POA: Diagnosis not present

## 2019-12-12 DIAGNOSIS — J45909 Unspecified asthma, uncomplicated: Secondary | ICD-10-CM | POA: Diagnosis not present

## 2019-12-12 DIAGNOSIS — I480 Paroxysmal atrial fibrillation: Secondary | ICD-10-CM | POA: Diagnosis not present

## 2019-12-12 DIAGNOSIS — F324 Major depressive disorder, single episode, in partial remission: Secondary | ICD-10-CM | POA: Diagnosis not present

## 2019-12-26 ENCOUNTER — Other Ambulatory Visit: Payer: Self-pay | Admitting: Family Medicine

## 2019-12-26 DIAGNOSIS — Z1231 Encounter for screening mammogram for malignant neoplasm of breast: Secondary | ICD-10-CM

## 2020-01-11 ENCOUNTER — Telehealth: Payer: Self-pay | Admitting: *Deleted

## 2020-01-11 ENCOUNTER — Other Ambulatory Visit: Payer: Self-pay | Admitting: Orthopedic Surgery

## 2020-01-11 DIAGNOSIS — G5603 Carpal tunnel syndrome, bilateral upper limbs: Secondary | ICD-10-CM | POA: Diagnosis not present

## 2020-01-11 DIAGNOSIS — R200 Anesthesia of skin: Secondary | ICD-10-CM | POA: Diagnosis not present

## 2020-01-11 NOTE — Telephone Encounter (Signed)
   [facility] Pre-operative Risk Assessment    Request for surgical clearance:  1. What type of surgery is being performed? RIGHT CARPAL TUNNEL RELEASE   2. When is this surgery scheduled? 01/24/20   3. What type of clearance is required (medical clearance vs. Pharmacy clearance to hold med vs. Both)? BOTH  4. Are there any medications that need to be held prior to surgery and how long? [place]   5. Practice name and name of physician performing surgery? [facility]; DR. Rice   6. What is your office phone number [phone]    7.   What is your office fax number [phone] ATTN: Odessa Fleming, RN  8.   Anesthesia type (None, local, MAC, general) ? IV REGIONAL FOREARM BLOCK   Julaine Hua 01/11/2020, 12:03 PM  _________________________________________________________________   (provider comments below)

## 2020-01-11 NOTE — Telephone Encounter (Signed)
Patient with diagnosis of atrial fibrillation on Xarelto for anticoagulation.    Procedure: right carpal tunnel release Date of procedure: 01/24/20  CHADS2-VASc score of  4 (HTN, AGE x 2, female)  CrCl 72.4 Platelet count 215  Per office protocol, patient can hold Xarelto  for 2 days prior to procedure.    Patient will not need bridging with Lovenox (enoxaparin) around procedure.  Marland Kitchen

## 2020-01-12 NOTE — Telephone Encounter (Signed)
   Primary Cardiologist: Thompson Grayer, MD  Chart reviewed as part of pre-operative protocol coverage. Given past medical history and time since last visit, based on ACC/AHA guidelines, ZI SEK would be at acceptable risk for the planned procedure without further cardiovascular testing.   Her RCRI is a class I risk, 0.4% risk of major cardiac event.  Patient with diagnosis of atrial fibrillation on Xarelto for anticoagulation.    Procedure: right carpal tunnel release Date of procedure: 01/24/20  CHADS2-VASc score of  4 (HTN, AGE x 2, female)  CrCl 72.4 Platelet count 215  Per office protocol, patient can hold Xarelto  for 2 days prior to procedure.    Patient will not need bridging with Lovenox (enoxaparin) around procedure.  I will route this recommendation to the requesting party via Epic fax function and remove from pre-op pool.  Please call with questions.  Jossie Ng. [facility] [address] Office [phone] Fax [phone]

## 2020-01-15 ENCOUNTER — Ambulatory Visit: Payer: Medicare HMO | Attending: Internal Medicine

## 2020-01-15 DIAGNOSIS — Z23 Encounter for immunization: Secondary | ICD-10-CM | POA: Insufficient documentation

## 2020-01-15 NOTE — Progress Notes (Signed)
   Covid-19 Vaccination Clinic  Name:  Julie Gill    MRN: [id number] DOB: 11-Dec-1940  01/15/2020  [name] was observed post Covid-19 immunization for 15 minutes without incidence. She was provided with Vaccine Information Sheet and instruction to access the V-Safe system.   [name] was instructed to call 911 with any severe reactions post vaccine: Marland Kitchen Difficulty breathing  . Swelling of your face and throat  . A fast heartbeat  . A bad rash all over your body  . Dizziness and weakness    Immunizations Administered    Name Date Dose VIS Date Route   Pfizer COVID-19 Vaccine 01/15/2020 10:39 AM 0.3 mL 11/11/2019 Intramuscular   Manufacturer: [employer]   Lot: [id number]   [place]: [address]-1

## 2020-01-20 ENCOUNTER — Other Ambulatory Visit: Payer: Self-pay

## 2020-01-20 ENCOUNTER — Encounter (HOSPITAL_BASED_OUTPATIENT_CLINIC_OR_DEPARTMENT_OTHER): Payer: Self-pay | Admitting: Orthopedic Surgery

## 2020-01-23 ENCOUNTER — Other Ambulatory Visit: Payer: Self-pay

## 2020-01-23 ENCOUNTER — Encounter (HOSPITAL_BASED_OUTPATIENT_CLINIC_OR_DEPARTMENT_OTHER)
Admission: RE | Admit: 2020-01-23 | Discharge: 2020-01-23 | Disposition: A | Discharge status: Home / Self Care | Payer: Medicare HMO | Source: Ambulatory Visit | Attending: Orthopedic Surgery | Admitting: Orthopedic Surgery

## 2020-01-23 ENCOUNTER — Other Ambulatory Visit (HOSPITAL_COMMUNITY)
Admission: RE | Admit: 2020-01-23 | Discharge: 2020-01-23 | Disposition: A | Discharge status: Home / Self Care | Payer: Medicare HMO | Source: Ambulatory Visit | Attending: Orthopedic Surgery | Admitting: Orthopedic Surgery

## 2020-01-23 DIAGNOSIS — Z20822 Contact with and (suspected) exposure to covid-19: Secondary | ICD-10-CM | POA: Insufficient documentation

## 2020-01-23 DIAGNOSIS — Z01812 Encounter for preprocedural laboratory examination: Secondary | ICD-10-CM | POA: Insufficient documentation

## 2020-01-23 LAB — BASIC METABOLIC PANEL
Anion gap: 11 (ref 5–15)
BUN: 17 mg/dL (ref 8–23)
CO2: 27 mmol/L (ref 22–32)
Calcium: 9.3 mg/dL (ref 8.9–10.3)
Chloride: 105 mmol/L (ref 98–111)
Creatinine, Ser: 0.85 mg/dL (ref 0.44–1.00)
GFR calc Af Amer: >60 mL/min (ref >60)
GFR calc non Af Amer: >60 mL/min (ref >60)
Glucose, Bld: 99 mg/dL (ref 70–99)
Potassium: 4.9 mmol/L (ref 3.5–5.1)
Sodium: 143 mmol/L (ref 135–145)

## 2020-01-23 LAB — SARS CORONAVIRUS 2 (TAT 6-24 HRS): SARS Coronavirus 2: NEGATIVE

## 2020-01-23 NOTE — Progress Notes (Signed)

## 2020-01-26 ENCOUNTER — Encounter (HOSPITAL_BASED_OUTPATIENT_CLINIC_OR_DEPARTMENT_OTHER): Payer: Self-pay | Admitting: Orthopedic Surgery

## 2020-01-26 ENCOUNTER — Other Ambulatory Visit: Payer: Self-pay

## 2020-01-26 ENCOUNTER — Ambulatory Visit (HOSPITAL_BASED_OUTPATIENT_CLINIC_OR_DEPARTMENT_OTHER): Payer: Medicare HMO | Admitting: Certified Registered"

## 2020-01-26 ENCOUNTER — Encounter (HOSPITAL_BASED_OUTPATIENT_CLINIC_OR_DEPARTMENT_OTHER)
Admission: RE | Disposition: A | Discharge status: Home / Self Care | Payer: Self-pay | Source: Home / Self Care | Attending: Orthopedic Surgery

## 2020-01-26 ENCOUNTER — Ambulatory Visit (HOSPITAL_BASED_OUTPATIENT_CLINIC_OR_DEPARTMENT_OTHER)
Admission: RE | Admit: 2020-01-26 | Discharge: 2020-01-26 | Disposition: A | Discharge status: Home / Self Care | Payer: Medicare HMO | Attending: Orthopedic Surgery | Admitting: Orthopedic Surgery

## 2020-01-26 DIAGNOSIS — I4891 Unspecified atrial fibrillation: Secondary | ICD-10-CM | POA: Diagnosis not present

## 2020-01-26 DIAGNOSIS — G2581 Restless legs syndrome: Secondary | ICD-10-CM | POA: Insufficient documentation

## 2020-01-26 DIAGNOSIS — Z87891 Personal history of nicotine dependence: Secondary | ICD-10-CM | POA: Insufficient documentation

## 2020-01-26 DIAGNOSIS — G5601 Carpal tunnel syndrome, right upper limb: Secondary | ICD-10-CM | POA: Diagnosis not present

## 2020-01-26 DIAGNOSIS — E669 Obesity, unspecified: Secondary | ICD-10-CM | POA: Insufficient documentation

## 2020-01-26 DIAGNOSIS — Z8249 Family history of ischemic heart disease and other diseases of the circulatory system: Secondary | ICD-10-CM | POA: Insufficient documentation

## 2020-01-26 DIAGNOSIS — J45909 Unspecified asthma, uncomplicated: Secondary | ICD-10-CM | POA: Diagnosis not present

## 2020-01-26 DIAGNOSIS — Z85118 Personal history of other malignant neoplasm of bronchus and lung: Secondary | ICD-10-CM | POA: Diagnosis not present

## 2020-01-26 DIAGNOSIS — G4730 Sleep apnea, unspecified: Secondary | ICD-10-CM | POA: Diagnosis not present

## 2020-01-26 DIAGNOSIS — G5603 Carpal tunnel syndrome, bilateral upper limbs: Secondary | ICD-10-CM | POA: Insufficient documentation

## 2020-01-26 DIAGNOSIS — F419 Anxiety disorder, unspecified: Secondary | ICD-10-CM | POA: Insufficient documentation

## 2020-01-26 DIAGNOSIS — I10 Essential (primary) hypertension: Secondary | ICD-10-CM | POA: Diagnosis not present

## 2020-01-26 DIAGNOSIS — Z86010 Personal history of colonic polyps: Secondary | ICD-10-CM | POA: Diagnosis not present

## 2020-01-26 DIAGNOSIS — Z79899 Other long term (current) drug therapy: Secondary | ICD-10-CM | POA: Insufficient documentation

## 2020-01-26 DIAGNOSIS — M1990 Unspecified osteoarthritis, unspecified site: Secondary | ICD-10-CM | POA: Diagnosis not present

## 2020-01-26 DIAGNOSIS — N393 Stress incontinence (female) (male): Secondary | ICD-10-CM | POA: Diagnosis not present

## 2020-01-26 DIAGNOSIS — Z7901 Long term (current) use of anticoagulants: Secondary | ICD-10-CM | POA: Diagnosis not present

## 2020-01-26 DIAGNOSIS — I480 Paroxysmal atrial fibrillation: Secondary | ICD-10-CM | POA: Diagnosis not present

## 2020-01-26 DIAGNOSIS — K219 Gastro-esophageal reflux disease without esophagitis: Secondary | ICD-10-CM | POA: Diagnosis not present

## 2020-01-26 DIAGNOSIS — E785 Hyperlipidemia, unspecified: Secondary | ICD-10-CM | POA: Diagnosis not present

## 2020-01-26 HISTORY — PX: CARPAL TUNNEL RELEASE: SHX101

## 2020-01-26 SURGERY — CARPAL TUNNEL RELEASE
Anesthesia: Regional | Site: Wrist | Laterality: Right

## 2020-01-26 MED ORDER — PROPOFOL 500 MG/50ML IV EMUL
INTRAVENOUS | Status: DC | PRN
  Administered 2020-01-26: 35 ug/kg/min via INTRAVENOUS

## 2020-01-26 MED ORDER — CEFAZOLIN SODIUM-DEXTROSE 2-4 GM/100ML-% IV SOLN
INTRAVENOUS | Status: AC
  Filled 2020-01-26: qty 100

## 2020-01-26 MED ORDER — OXYCODONE HCL 5 MG PO TABS: 5.0000 mg | ORAL_TABLET | Freq: Once | ORAL | Status: DC | PRN

## 2020-01-26 MED ORDER — ACETAMINOPHEN 10 MG/ML IV SOLN: 1000.0000 mg | Freq: Once | INTRAVENOUS | Status: DC | PRN

## 2020-01-26 MED ORDER — LACTATED RINGERS IV SOLN: INTRAVENOUS | Status: DC

## 2020-01-26 MED ORDER — PROMETHAZINE HCL 25 MG/ML IJ SOLN: 6.2500 mg | INTRAMUSCULAR | Status: DC | PRN

## 2020-01-26 MED ORDER — LIDOCAINE 2% (20 MG/ML) 5 ML SYRINGE
INTRAMUSCULAR | Status: AC
  Filled 2020-01-26: qty 5

## 2020-01-26 MED ORDER — CEFAZOLIN SODIUM-DEXTROSE 2-4 GM/100ML-% IV SOLN
2.0000 g | INTRAVENOUS | Status: AC
  Administered 2020-01-26: 14:00:00 2 g via INTRAVENOUS

## 2020-01-26 MED ORDER — FENTANYL CITRATE (PF) 100 MCG/2ML IJ SOLN: 25.0000 ug | INTRAMUSCULAR | Status: DC | PRN

## 2020-01-26 MED ORDER — OXYCODONE HCL 5 MG/5ML PO SOLN: 5.0000 mg | Freq: Once | ORAL | Status: DC | PRN

## 2020-01-26 MED ORDER — FENTANYL CITRATE (PF) 100 MCG/2ML IJ SOLN
INTRAMUSCULAR | Status: AC
  Filled 2020-01-26: qty 2

## 2020-01-26 MED ORDER — TRAMADOL HCL 50 MG PO TABS: 50.0000 mg | ORAL_TABLET | Freq: Four times a day (QID) | ORAL | 0 refills | Status: DC | PRN

## 2020-01-26 MED ORDER — ACETAMINOPHEN 325 MG PO TABS: 325.0000 mg | ORAL_TABLET | Freq: Once | ORAL | Status: DC | PRN

## 2020-01-26 MED ORDER — MIDAZOLAM HCL 2 MG/2ML IJ SOLN: 1.0000 mg | INTRAMUSCULAR | Status: DC | PRN

## 2020-01-26 MED ORDER — LIDOCAINE HCL (PF) 0.5 % IJ SOLN
INTRAMUSCULAR | Status: DC | PRN
  Administered 2020-01-26: 35 mL via INTRAVENOUS

## 2020-01-26 MED ORDER — ACETAMINOPHEN 160 MG/5ML PO SOLN: 325.0000 mg | Freq: Once | ORAL | Status: DC | PRN

## 2020-01-26 MED ORDER — CHLORHEXIDINE GLUCONATE 4 % EX LIQD: 60.0000 mL | Freq: Once | CUTANEOUS | Status: DC

## 2020-01-26 MED ORDER — ONDANSETRON HCL 4 MG/2ML IJ SOLN
INTRAMUSCULAR | Status: AC
  Filled 2020-01-26: qty 2

## 2020-01-26 MED ORDER — FENTANYL CITRATE (PF) 100 MCG/2ML IJ SOLN
50.0000 ug | INTRAMUSCULAR | Status: DC | PRN
  Administered 2020-01-26: 50 ug via INTRAVENOUS

## 2020-01-26 MED ORDER — BUPIVACAINE HCL (PF) 0.25 % IJ SOLN
INTRAMUSCULAR | Status: DC | PRN
  Administered 2020-01-26: 9 mL

## 2020-01-26 MED ORDER — PROPOFOL 10 MG/ML IV BOLUS
INTRAVENOUS | Status: DC | PRN
  Administered 2020-01-26 (×3): 20 mg via INTRAVENOUS

## 2020-01-26 MED ORDER — MEPERIDINE HCL 25 MG/ML IJ SOLN: 6.2500 mg | INTRAMUSCULAR | Status: DC | PRN

## 2020-01-26 MED ORDER — ONDANSETRON HCL 4 MG/2ML IJ SOLN
INTRAMUSCULAR | Status: DC | PRN
  Administered 2020-01-26: 4 mg via INTRAVENOUS

## 2020-01-26 SURGICAL SUPPLY — 34 items
APL PRP STRL LF DISP 70% ISPRP (MISCELLANEOUS) ×1
BLADE SURG 15 STRL LF DISP TIS (BLADE) ×1 IMPLANT
BLADE SURG 15 STRL SS (BLADE) ×3
BNDG CMPR 9X4 STRL LF SNTH (GAUZE/BANDAGES/DRESSINGS) ×1
BNDG COHESIVE 3X5 TAN STRL LF (GAUZE/BANDAGES/DRESSINGS) ×3 IMPLANT
BNDG ESMARK 4X9 LF (GAUZE/BANDAGES/DRESSINGS) ×2 IMPLANT
BNDG GAUZE ELAST 4 BULKY (GAUZE/BANDAGES/DRESSINGS) ×3 IMPLANT
CHLORAPREP W/TINT 26 (MISCELLANEOUS) ×3 IMPLANT
CORD BIPOLAR FORCEPS 12FT (ELECTRODE) ×3 IMPLANT
COVER BACK TABLE 60X90IN (DRAPES) ×3 IMPLANT
COVER MAYO STAND STRL (DRAPES) ×3 IMPLANT
CUFF TOURN SGL QUICK 18X4 (TOURNIQUET CUFF) ×3 IMPLANT
DRAPE EXTREMITY T 121X128X90 (DISPOSABLE) ×3 IMPLANT
DRAPE SURG 17X23 STRL (DRAPES) ×3 IMPLANT
DRSG PAD ABDOMINAL 8X10 ST (GAUZE/BANDAGES/DRESSINGS) ×3 IMPLANT
GAUZE SPONGE 4X4 12PLY STRL (GAUZE/BANDAGES/DRESSINGS) ×3 IMPLANT
GAUZE XEROFORM 1X8 LF (GAUZE/BANDAGES/DRESSINGS) ×3 IMPLANT
GLOVE BIOGEL PI IND STRL 8.5 (GLOVE) ×1 IMPLANT
GLOVE BIOGEL PI INDICATOR 8.5 (GLOVE) ×2
GLOVE SURG ORTHO 8.0 STRL STRW (GLOVE) ×3 IMPLANT
GOWN STRL REUS W/ TWL LRG LVL3 (GOWN DISPOSABLE) ×1 IMPLANT
GOWN STRL REUS W/TWL LRG LVL3 (GOWN DISPOSABLE) ×3
GOWN STRL REUS W/TWL XL LVL3 (GOWN DISPOSABLE) ×3 IMPLANT
NDL PRECISIONGLIDE 27X1.5 (NEEDLE) IMPLANT
NEEDLE PRECISIONGLIDE 27X1.5 (NEEDLE) ×3 IMPLANT
NS IRRIG 1000ML POUR BTL (IV SOLUTION) ×3 IMPLANT
PACK BASIN DAY SURGERY FS (CUSTOM PROCEDURE TRAY) ×3 IMPLANT
STOCKINETTE 4X48 STRL (DRAPES) ×3 IMPLANT
SUT ETHILON 4 0 PS 2 18 (SUTURE) ×3 IMPLANT
SUT VICRYL 4-0 PS2 18IN ABS (SUTURE) IMPLANT
SYR BULB 3OZ (MISCELLANEOUS) ×3 IMPLANT
SYR CONTROL 10ML LL (SYRINGE) ×2 IMPLANT
TOWEL GREEN STERILE FF (TOWEL DISPOSABLE) ×3 IMPLANT
UNDERPAD 30X36 HEAVY ABSORB (UNDERPADS AND DIAPERS) ×3 IMPLANT

## 2020-01-26 NOTE — H&P (Signed)
Julie Gill is an 79 y.o. female.   Chief Complaint: numbness numbness right hand HPI:  [place] a 79 year old right-hand-dominant female comes in complaining of numbness and tingling thumb to her middle fingers right hand. This been going on for a month. She recalls no history of injury to the hand or to the neck. She has no history of car accidents. This does awaken her 7 out of 7 nights with a throbbing type discomfort. This occurs about 4 in the morning. She has tried a sleeve which has not helped. She states doing her hair seems to increase her discomfort for her. She states rubbing it will frequently help she has tried taking Tylenol. She has no history of diabetes thyroid problems or gout. Family history is negative for each of these also.She was referred for nerve conductions at that time. These have been done by Dr. Tamsen Roers. Reveals a severe carpal tunnel syndrome on her right side with motor delay is 7 no sensory response her left side is much better with a motor delay of 4.9 sensory delay 4 5.    Past Medical History:  Diagnosis Date  . Anxiety   . Arthritis   . Asthma    dx 10 yrs ago  . Atrial fibrillation (HCC)    CHADS VASC score 3. Intolerant to flecainide. s/p afib ablation 02/12/12  . Colon polyps   . Diastolic dysfunction   . Dysrhythmia    a fib   dx 2014  . GERD (gastroesophageal reflux disease)   . History of kidney infection     last one was approx 2009  . Hyperlipidemia   . Hypertension   . lung ca dx'd 08/2016  . Mild sleep apnea    wears NO equipment  . Mitral regurgitation    mild  . Neuromuscular disorder ([place])   . Obesity   . Plantar fasciitis   . Restless leg syndrome   . Sinus bradycardia   . Sleep apnea   . Stress bladder incontinence, female    WEARS SMALL PADS    Past Surgical History:  Procedure Laterality Date  . ABDOMINAL HYSTERECTOMY  1992   BSO  . APPENDECTOMY    . atrial fibrillation ablation  02/12/12   PVI by Dr Rayann Heman  . ATRIAL  FIBRILLATION ABLATION N/A 02/12/2012   Procedure: ATRIAL FIBRILLATION ABLATION;  Surgeon: Thompson Grayer, MD;  Location: [facility] CATH LAB;  Service: Cardiovascular;  Laterality: N/A;  . BLADDER SUSPENSION    . [place], 200,2004, 2007, 05/06/2011   adenomas  . heels spurs     x 2  . TEE WITHOUT CARDIOVERSION  02/11/2012   Procedure: TRANSESOPHAGEAL ECHOCARDIOGRAM (TEE);  Surgeon: Larey Dresser, MD;  Location: [facility];  Service: Cardiovascular;  Laterality: N/A;  . TONSILLECTOMY AND ADENOIDECTOMY    . VARICOSE VEIN SURGERY    . VIDEO ASSISTED THORACOSCOPY (VATS)/WEDGE RESECTION Right 10/02/2016   Procedure: VIDEO ASSISTED THORACOSCOPY (VATS)/WEDGE RESECTION;  Surgeon: [name], MD;  Location: [facility];  Service: Thoracic;  Laterality: Right;    Family History  Problem Relation Age of Onset  . Colon cancer Maternal Uncle   . Colon cancer Maternal Grandmother   . Congestive Heart Failure Mother   . Lung cancer Father   . Diabetes Brother   . Hypertension Brother   . Breast cancer Neg Hx    Social History:  reports that she quit smoking about 34 years ago. Her smoking use included cigarettes. She has  never used smokeless tobacco. She reports that she does not drink alcohol or use drugs.  Allergies:  Allergies  Allergen Reactions  . Codeine Other (See Comments)    Skin crawls, jittery, agitated  . Prednisone Other (See Comments)    Tachycardia, light headed, rash    No medications prior to admission.    No results found for this or any previous visit (from the past 48 hour(s)).  No results found.   Pertinent items are noted in HPI.  Height 5' 1.5" (1.562 m), weight 77.1 kg.  General appearance: alert, cooperative and appears stated age Head: Normocephalic, without obvious abnormality Neck: no JVD Resp: clear to auscultation bilaterally Cardio: regular rate and rhythm, S1, S2 normal, no murmur, click, rub or gallop GI: soft, non-tender; bowel  sounds normal; no masses,  no organomegaly Extremities: numbness right hand Pulses: 2+ and symmetric Skin: Skin color, texture, turgor normal. No rashes or lesions Neurologic: Grossly normal Incision/Wound: na  Assessment/Plan Diagnosed bilateral carpal tunnel syndrome severe on the right moderate on the left.   Plan: We have discussed possibility of surgical decompression of the right median nerve. She would like to proceed to have that done. Preperi-and postoperative course been discussed along with risk and complications. She is aware there is no guarantee to the surgery the possibility of infection recurrence injury to arteries nerves tendons complete relief symptoms and dystrophy. Is scheduled for right carpal tunnel release in outpatient under regional anesthesia.   Daryll Brod 01/26/2020, 8:27 AM

## 2020-01-26 NOTE — Brief Op Note (Signed)
01/26/2020  2:30 PM  PATIENT:  Julie Gill  79 y.o. female  PRE-OPERATIVE DIAGNOSIS:  RIGHT CARPAL TUNNEL SYNDROME  POST-OPERATIVE DIAGNOSIS:  RIGHT CARPAL TUNNEL SYNDROME  PROCEDURE:  Procedure(s): CARPAL TUNNEL RELEASE (Right)  SURGEON:  Surgeon(s) and Role:    * Daryll Brod, MD - Primary  PHYSICIAN ASSISTANT:   ASSISTANTS: none   ANESTHESIA:   local, regional and IV sedation  EBL: 78ml  BLOOD ADMINISTERED:none  DRAINS: none   LOCAL MEDICATIONS USED:  BUPIVICAINE   SPECIMEN:  No Specimen  DISPOSITION OF SPECIMEN:  N/A  COUNTS:  YES  TOURNIQUET:   Total Tourniquet Time Documented: Forearm (Right) - 28 minutes Total: Forearm (Right) - 28 minutes   DICTATION: .Viviann Spare Dictation  PLAN OF CARE: Discharge to home after PACU  PATIENT DISPOSITION:  PACU - hemodynamically stable.

## 2020-01-26 NOTE — Anesthesia Preprocedure Evaluation (Signed)
Anesthesia Evaluation    Reviewed: Allergy & Precautions, Patient's Chart, lab work & pertinent test results  Airway Mallampati: I  TM Distance: >3 FB Neck ROM: Full    Dental  (+) Teeth Intact, Caps, Dental Advisory Given   Pulmonary asthma , sleep apnea , former smoker,    breath sounds clear to auscultation       Cardiovascular hypertension, + dysrhythmias Atrial Fibrillation  Rhythm:Regular Rate:Normal     Neuro/Psych  Headaches, Anxiety    GI/Hepatic Neg liver ROS, GERD  ,  Endo/Other  negative endocrine ROS  Renal/GU negative Renal ROS     Musculoskeletal  (+) Arthritis ,   Abdominal Normal abdominal exam  (+)   Peds  Hematology negative hematology ROS (+)   Anesthesia Other Findings   Reproductive/Obstetrics                             Anesthesia Physical Anesthesia Plan  ASA: III  Anesthesia Plan: Bier Block and Bier Block-LIDOCAINE ONLY   Post-op Pain Management:    Induction: Intravenous  PONV Risk Score and Plan: Propofol infusion and Ondansetron  Airway Management Planned: Natural Airway and Simple Face Mask  Additional Equipment: None  Intra-op Plan:   Post-operative Plan:   Informed Consent:   Plan Discussed with: CRNA  Anesthesia Plan Comments:         Anesthesia Quick Evaluation

## 2020-01-26 NOTE — Anesthesia Postprocedure Evaluation (Signed)
Anesthesia Post Note  Patient: Julie Gill  Procedure(s) Performed: CARPAL TUNNEL RELEASE (Right Wrist)     Patient location during evaluation: PACU Anesthesia Type: Bier Block Level of consciousness: awake and alert Pain management: pain level controlled Vital Signs Assessment: post-procedure vital signs reviewed and stable Respiratory status: spontaneous breathing, nonlabored ventilation, respiratory function stable and patient connected to nasal cannula oxygen Cardiovascular status: stable and blood pressure returned to baseline Postop Assessment: no apparent nausea or vomiting Anesthetic complications: no    Last Vitals:  Vitals:   01/26/20 1445 01/26/20 1500  BP: (!) 154/77   Pulse: (!) 52 (!) 58  Resp: (!) 22 15  Temp:    SpO2: 100% 96%    Last Pain:  Vitals:   01/26/20 1445  TempSrc:   PainSc: 0-No pain                 Effie Berkshire

## 2020-01-26 NOTE — Op Note (Signed)
NAME: Julie Gill MEDICAL RECORD NO: [id number] DATE OF BIRTH: 09/21/1941 FACILITY: [facility] LOCATION: [facility] PHYSICIAN: Wynonia Sours, MD   OPERATIVE REPORT   DATE OF PROCEDURE: 01/26/20    PREOPERATIVE DIAGNOSIS:   Carpal tunnel syndrome right hand   POSTOPERATIVE DIAGNOSIS:   Same   PROCEDURE:   Decompression median nerve right hand   SURGEON: Daryll Brod, M.D.   ASSISTANT: none   ANESTHESIA:  Bier block with sedation and Local   INTRAVENOUS FLUIDS:  Per anesthesia flow sheet.   ESTIMATED BLOOD LOSS:  Minimal.   COMPLICATIONS:  None.   SPECIMENS:  none   TOURNIQUET TIME:    Total Tourniquet Time Documented: Forearm (Right) - 28 minutes Total: Forearm (Right) - 28 minutes    DISPOSITION:  Stable to PACU.   INDICATIONS: Patient is a 79 year old female with a history of numbness and tingling bilateral hands.  Nerve conductions reveal bilateral carpal tunnel syndrome severe on the right moderate on the left.  She has elected undergo surgical intervention declining conservative care.  Preperi-and postoperative course been discussed along with risks and complications.  She is aware that there is no guarantee to the surgery the possibility of infection recurrence injury to arteries nerves tendons complete relief symptoms and dystrophy.  In the preoperative area the patient is seen the extremity marked by both patient and surgeon antibiotic given  OPERATIVE COURSE: Patient is brought to the operating room where form based IV regional anesthetic was carried out without difficulty under the direction of the anesthesia department.  She was prepped using ChloraPrep in a supine position with the right arm free.  A 3-minute dry time was allowed and timeout taken to confirm patient procedure.  A longitudinal incision was made in the right palm carried down through subcutaneous tissue.  Bleeders were electrocauterized with bipolar.  The palmar fascia was split  longitudinally.  The distal margin of the flexor retinaculum and the superficial palmar arch was identified.  The dissection carried deeply after making a small incision proximally through the distal aspect of the flexor retinaculum allowing visualization of the flexor tendon to the ring little finger.  These were retracted radially along with the radial nerve the ulnar nerve was retracted ulnarly.  The flexor retinaculum was then released on its ulnar border with sharp dissection.  Right angle and stool retractor placed between skin and forearm fascia.  Deep structures were dissected free with blunt dissection.  The proximal aspect of the flexor retinaculum distal forearm fascia was then released for approximately 2 to 3 cm proximal to the wrist crease under direct vision.  The canal was explored.  An area of hyperemia along the entire course of the median nerve was noted.  The motor branch entered the muscle distally.  No further lesions were identified.  The wound was copiously irrigated with saline.  The skin was closed interrupted 4-0 nylon sutures.  Local infiltration with quarter percent bupivacaine without epinephrine was given approximately 8 cc was used.  A sterile compressive dressing with the fingers 3 was applied.  Inflation of the tourniquet all fingers immediately pink.  She was taken to the recovery room for observation in satisfactory condition.  She will be discharged home to return [place] in 1 week on Tylenol  for pain with Ultram for breakthrough.   Daryll Brod, MD Electronically signed, 01/26/20

## 2020-01-26 NOTE — Transfer of Care (Signed)
Immediate Anesthesia Transfer of Care Note  Patient: Julie Gill  Procedure(s) Performed: CARPAL TUNNEL RELEASE (Right Wrist)  Patient Location: PACU  Anesthesia Type:MAC  Level of Consciousness: awake, alert  and oriented  Airway & Oxygen Therapy: Patient Spontanous Breathing and Patient connected to face mask oxygen  Post-op Assessment: Report given to RN and Post -op Vital signs reviewed and stable  Post vital signs: Reviewed and stable  Last Vitals:  Vitals Value Taken Time  BP 154/64 01/26/20 1430  Temp    Pulse 62 01/26/20 1432  Resp 20 01/26/20 1432  SpO2 100 % 01/26/20 1432  Vitals shown include unvalidated device data.  Last Pain:  Vitals:   01/26/20 1214  TempSrc: Temporal  PainSc: 0-No pain      Patients Stated Pain Goal: 8 (94/80/16 5537)  Complications: No apparent anesthesia complications

## 2020-01-26 NOTE — Discharge Instructions (Addendum)

## 2020-01-27 ENCOUNTER — Encounter: Payer: Self-pay | Admitting: *Deleted

## 2020-02-03 ENCOUNTER — Ambulatory Visit: Payer: PPO

## 2020-02-07 ENCOUNTER — Ambulatory Visit: Payer: Medicare HMO | Attending: Internal Medicine

## 2020-02-07 DIAGNOSIS — Z23 Encounter for immunization: Secondary | ICD-10-CM | POA: Insufficient documentation

## 2020-02-07 NOTE — Progress Notes (Signed)
   Covid-19 Vaccination Clinic  Name:  PICCOLA ARICO    MRN: [id number] DOB: May 21, 1941  02/07/2020  Ms. Goettl was observed post Covid-19 immunization for 15 minutes without incident. She was provided with Vaccine Information Sheet and instruction to access the V-Safe system.   Ms. Currin was instructed to call 911 with any severe reactions post vaccine: Marland Kitchen Difficulty breathing  . Swelling of face and throat  . A fast heartbeat  . A bad rash all over body  . Dizziness and weakness   Immunizations Administered    Name Date Dose VIS Date Route   Pfizer COVID-19 Vaccine 02/07/2020  3:53 PM 0.3 mL 11/11/2019 Intramuscular   Manufacturer: [place]   Lot: [id number]   [place]: [id number]      Covid-19 Vaccination Clinic  Name:  ORCHID GLASSBERG    MRN: [id number] DOB: 04-Nov-1941  02/07/2020  Ms. Madia was observed post Covid-19 immunization for 15 minutes without incident. She was provided with Vaccine Information Sheet and instruction to access the V-Safe system.   Ms. Duffy was instructed to call 911 with any severe reactions post vaccine: Marland Kitchen Difficulty breathing  . Swelling of face and throat  . A fast heartbeat  . A bad rash all over body  . Dizziness and weakness   Immunizations Administered    Name Date Dose VIS Date Route   Pfizer COVID-19 Vaccine 02/07/2020  3:53 PM 0.3 mL 11/11/2019 Intramuscular   Manufacturer: [place]   Lot: [id number]   [place]: [id number]

## 2020-02-20 DIAGNOSIS — C3491 Malignant neoplasm of unspecified part of right bronchus or lung: Secondary | ICD-10-CM | POA: Diagnosis not present

## 2020-02-20 DIAGNOSIS — G2581 Restless legs syndrome: Secondary | ICD-10-CM | POA: Diagnosis not present

## 2020-02-20 DIAGNOSIS — K219 Gastro-esophageal reflux disease without esophagitis: Secondary | ICD-10-CM | POA: Diagnosis not present

## 2020-02-20 DIAGNOSIS — N3941 Urge incontinence: Secondary | ICD-10-CM | POA: Diagnosis not present

## 2020-02-20 DIAGNOSIS — I10 Essential (primary) hypertension: Secondary | ICD-10-CM | POA: Diagnosis not present

## 2020-02-20 DIAGNOSIS — E782 Mixed hyperlipidemia: Secondary | ICD-10-CM | POA: Diagnosis not present

## 2020-02-20 DIAGNOSIS — Z0000 Encounter for general adult medical examination without abnormal findings: Secondary | ICD-10-CM | POA: Diagnosis not present

## 2020-02-20 DIAGNOSIS — I480 Paroxysmal atrial fibrillation: Secondary | ICD-10-CM | POA: Diagnosis not present

## 2020-02-22 ENCOUNTER — Ambulatory Visit
Admission: RE | Admit: 2020-02-22 | Discharge: 2020-02-22 | Disposition: A | Discharge status: Home / Self Care | Payer: Medicare HMO | Source: Ambulatory Visit | Attending: Family Medicine | Admitting: Family Medicine

## 2020-02-22 ENCOUNTER — Other Ambulatory Visit: Payer: Self-pay

## 2020-02-22 DIAGNOSIS — I480 Paroxysmal atrial fibrillation: Secondary | ICD-10-CM | POA: Diagnosis not present

## 2020-02-22 DIAGNOSIS — J45909 Unspecified asthma, uncomplicated: Secondary | ICD-10-CM | POA: Diagnosis not present

## 2020-02-22 DIAGNOSIS — Z1231 Encounter for screening mammogram for malignant neoplasm of breast: Secondary | ICD-10-CM | POA: Diagnosis not present

## 2020-02-22 DIAGNOSIS — E782 Mixed hyperlipidemia: Secondary | ICD-10-CM | POA: Diagnosis not present

## 2020-02-22 DIAGNOSIS — D508 Other iron deficiency anemias: Secondary | ICD-10-CM | POA: Diagnosis not present

## 2020-02-22 DIAGNOSIS — I10 Essential (primary) hypertension: Secondary | ICD-10-CM | POA: Diagnosis not present

## 2020-02-22 DIAGNOSIS — F324 Major depressive disorder, single episode, in partial remission: Secondary | ICD-10-CM | POA: Diagnosis not present

## 2020-02-22 DIAGNOSIS — C3491 Malignant neoplasm of unspecified part of right bronchus or lung: Secondary | ICD-10-CM | POA: Diagnosis not present

## 2020-04-06 DIAGNOSIS — Z79899 Other long term (current) drug therapy: Secondary | ICD-10-CM | POA: Diagnosis not present

## 2020-04-06 DIAGNOSIS — R7309 Other abnormal glucose: Secondary | ICD-10-CM | POA: Diagnosis not present

## 2020-04-06 DIAGNOSIS — Z0000 Encounter for general adult medical examination without abnormal findings: Secondary | ICD-10-CM | POA: Diagnosis not present

## 2020-04-06 DIAGNOSIS — K219 Gastro-esophageal reflux disease without esophagitis: Secondary | ICD-10-CM | POA: Diagnosis not present

## 2020-04-06 DIAGNOSIS — E782 Mixed hyperlipidemia: Secondary | ICD-10-CM | POA: Diagnosis not present

## 2020-04-06 DIAGNOSIS — N3941 Urge incontinence: Secondary | ICD-10-CM | POA: Diagnosis not present

## 2020-04-06 DIAGNOSIS — J309 Allergic rhinitis, unspecified: Secondary | ICD-10-CM | POA: Diagnosis not present

## 2020-04-06 DIAGNOSIS — I10 Essential (primary) hypertension: Secondary | ICD-10-CM | POA: Diagnosis not present

## 2020-04-06 DIAGNOSIS — I480 Paroxysmal atrial fibrillation: Secondary | ICD-10-CM | POA: Diagnosis not present

## 2020-04-10 DIAGNOSIS — G2581 Restless legs syndrome: Secondary | ICD-10-CM | POA: Diagnosis not present

## 2020-04-10 DIAGNOSIS — J309 Allergic rhinitis, unspecified: Secondary | ICD-10-CM | POA: Diagnosis not present

## 2020-04-10 DIAGNOSIS — R7309 Other abnormal glucose: Secondary | ICD-10-CM | POA: Diagnosis not present

## 2020-04-10 DIAGNOSIS — I480 Paroxysmal atrial fibrillation: Secondary | ICD-10-CM | POA: Diagnosis not present

## 2020-04-10 DIAGNOSIS — K219 Gastro-esophageal reflux disease without esophagitis: Secondary | ICD-10-CM | POA: Diagnosis not present

## 2020-04-10 DIAGNOSIS — C3491 Malignant neoplasm of unspecified part of right bronchus or lung: Secondary | ICD-10-CM | POA: Diagnosis not present

## 2020-04-10 DIAGNOSIS — I10 Essential (primary) hypertension: Secondary | ICD-10-CM | POA: Diagnosis not present

## 2020-04-10 DIAGNOSIS — D6869 Other thrombophilia: Secondary | ICD-10-CM | POA: Diagnosis not present

## 2020-04-10 DIAGNOSIS — E782 Mixed hyperlipidemia: Secondary | ICD-10-CM | POA: Diagnosis not present

## 2020-05-22 DIAGNOSIS — I480 Paroxysmal atrial fibrillation: Secondary | ICD-10-CM | POA: Diagnosis not present

## 2020-05-22 DIAGNOSIS — F324 Major depressive disorder, single episode, in partial remission: Secondary | ICD-10-CM | POA: Diagnosis not present

## 2020-05-22 DIAGNOSIS — E782 Mixed hyperlipidemia: Secondary | ICD-10-CM | POA: Diagnosis not present

## 2020-05-22 DIAGNOSIS — D508 Other iron deficiency anemias: Secondary | ICD-10-CM | POA: Diagnosis not present

## 2020-05-22 DIAGNOSIS — J45909 Unspecified asthma, uncomplicated: Secondary | ICD-10-CM | POA: Diagnosis not present

## 2020-05-22 DIAGNOSIS — C3491 Malignant neoplasm of unspecified part of right bronchus or lung: Secondary | ICD-10-CM | POA: Diagnosis not present

## 2020-05-22 DIAGNOSIS — I10 Essential (primary) hypertension: Secondary | ICD-10-CM | POA: Diagnosis not present

## 2020-06-20 DIAGNOSIS — I10 Essential (primary) hypertension: Secondary | ICD-10-CM | POA: Diagnosis not present

## 2020-06-20 DIAGNOSIS — C3491 Malignant neoplasm of unspecified part of right bronchus or lung: Secondary | ICD-10-CM | POA: Diagnosis not present

## 2020-06-20 DIAGNOSIS — E782 Mixed hyperlipidemia: Secondary | ICD-10-CM | POA: Diagnosis not present

## 2020-06-20 DIAGNOSIS — D508 Other iron deficiency anemias: Secondary | ICD-10-CM | POA: Diagnosis not present

## 2020-06-20 DIAGNOSIS — I480 Paroxysmal atrial fibrillation: Secondary | ICD-10-CM | POA: Diagnosis not present

## 2020-06-20 DIAGNOSIS — F324 Major depressive disorder, single episode, in partial remission: Secondary | ICD-10-CM | POA: Diagnosis not present

## 2020-06-20 DIAGNOSIS — J45909 Unspecified asthma, uncomplicated: Secondary | ICD-10-CM | POA: Diagnosis not present

## 2020-07-05 DIAGNOSIS — I10 Essential (primary) hypertension: Secondary | ICD-10-CM | POA: Diagnosis not present

## 2020-07-05 DIAGNOSIS — D508 Other iron deficiency anemias: Secondary | ICD-10-CM | POA: Diagnosis not present

## 2020-07-05 DIAGNOSIS — F324 Major depressive disorder, single episode, in partial remission: Secondary | ICD-10-CM | POA: Diagnosis not present

## 2020-07-05 DIAGNOSIS — J45909 Unspecified asthma, uncomplicated: Secondary | ICD-10-CM | POA: Diagnosis not present

## 2020-07-05 DIAGNOSIS — E782 Mixed hyperlipidemia: Secondary | ICD-10-CM | POA: Diagnosis not present

## 2020-07-05 DIAGNOSIS — C3491 Malignant neoplasm of unspecified part of right bronchus or lung: Secondary | ICD-10-CM | POA: Diagnosis not present

## 2020-07-05 DIAGNOSIS — I480 Paroxysmal atrial fibrillation: Secondary | ICD-10-CM | POA: Diagnosis not present

## 2020-08-13 ENCOUNTER — Telehealth: Payer: Self-pay | Admitting: Internal Medicine

## 2020-08-13 MED ORDER — NITROGLYCERIN 0.4 MG SL SUBL: 0.4000 mg | SUBLINGUAL_TABLET | SUBLINGUAL | 1 refills | Status: AC | PRN

## 2020-08-13 NOTE — Telephone Encounter (Signed)
*  STAT* If patient is at the pharmacy, call can be transferred to refill team.   1. Which medications need to be refilled? (please list name of each medication and dose if known) nitroglycerin 0.4  2. Which pharmacy/location (including street and city if local pharmacy) is medication to be sent to? [facility]   3. Do they need a 30 day or 90 day supply? [place]

## 2020-08-13 NOTE — Telephone Encounter (Signed)
Pt's medication was sent to pt's pharmacy as requested. Confirmation received.  °

## 2020-08-20 DIAGNOSIS — C3491 Malignant neoplasm of unspecified part of right bronchus or lung: Secondary | ICD-10-CM | POA: Diagnosis not present

## 2020-08-20 DIAGNOSIS — D508 Other iron deficiency anemias: Secondary | ICD-10-CM | POA: Diagnosis not present

## 2020-08-20 DIAGNOSIS — I10 Essential (primary) hypertension: Secondary | ICD-10-CM | POA: Diagnosis not present

## 2020-08-20 DIAGNOSIS — I480 Paroxysmal atrial fibrillation: Secondary | ICD-10-CM | POA: Diagnosis not present

## 2020-08-20 DIAGNOSIS — E782 Mixed hyperlipidemia: Secondary | ICD-10-CM | POA: Diagnosis not present

## 2020-08-20 DIAGNOSIS — F324 Major depressive disorder, single episode, in partial remission: Secondary | ICD-10-CM | POA: Diagnosis not present

## 2020-08-20 DIAGNOSIS — J45909 Unspecified asthma, uncomplicated: Secondary | ICD-10-CM | POA: Diagnosis not present

## 2020-09-12 DIAGNOSIS — E782 Mixed hyperlipidemia: Secondary | ICD-10-CM | POA: Diagnosis not present

## 2020-09-12 DIAGNOSIS — D508 Other iron deficiency anemias: Secondary | ICD-10-CM | POA: Diagnosis not present

## 2020-09-12 DIAGNOSIS — I480 Paroxysmal atrial fibrillation: Secondary | ICD-10-CM | POA: Diagnosis not present

## 2020-09-12 DIAGNOSIS — I10 Essential (primary) hypertension: Secondary | ICD-10-CM | POA: Diagnosis not present

## 2020-09-12 DIAGNOSIS — J45909 Unspecified asthma, uncomplicated: Secondary | ICD-10-CM | POA: Diagnosis not present

## 2020-09-12 DIAGNOSIS — F324 Major depressive disorder, single episode, in partial remission: Secondary | ICD-10-CM | POA: Diagnosis not present

## 2020-09-12 DIAGNOSIS — C3491 Malignant neoplasm of unspecified part of right bronchus or lung: Secondary | ICD-10-CM | POA: Diagnosis not present

## 2020-09-24 ENCOUNTER — Ambulatory Visit: Payer: Medicare HMO | Admitting: Internal Medicine

## 2020-10-01 ENCOUNTER — Other Ambulatory Visit: Payer: Self-pay

## 2020-10-01 ENCOUNTER — Encounter: Payer: Self-pay | Admitting: Internal Medicine

## 2020-10-01 ENCOUNTER — Ambulatory Visit: Payer: Medicare HMO | Admitting: Internal Medicine

## 2020-10-01 VITALS — BP 148/82 | HR 69 | Ht 61.5 in | Wt 191.8 lb

## 2020-10-01 DIAGNOSIS — I480 Paroxysmal atrial fibrillation: Secondary | ICD-10-CM

## 2020-10-01 DIAGNOSIS — I10 Essential (primary) hypertension: Secondary | ICD-10-CM

## 2020-10-01 NOTE — Patient Instructions (Addendum)
Medication Instructions:  Your physician recommends that you continue on your current medications as directed. Please refer to the Current Medication list given to you today.  *If you need a refill on your cardiac medications before your next appointment, please call your pharmacy*  Lab Work: None ordered.  If you have labs (blood work) drawn today and your tests are completely normal, you will receive your results only by: Marland Kitchen MyChart Message (if you have MyChart) OR . A paper copy in the mail If you have any lab test that is abnormal or we need to change your treatment, we will call you to review the results.  Testing/Procedures: None ordered.  Follow-Up: At [facility], you and your health needs are our priority.  As part of our continuing mission to provide you with exceptional heart care, we have created designated Provider Care Teams.  These Care Teams include your primary Cardiologist (physician) and Advanced Practice Providers (APPs -  Physician Assistants and Nurse Practitioners) who all work together to provide you with the care you need, when you need it.  We recommend signing up for the patient portal called "MyChart".  Sign up information is provided on this After Visit Summary.  MyChart is used to connect with patients for Virtual Visits (Telemedicine).  Patients are able to view lab/test results, encounter notes, upcoming appointments, etc.  Non-urgent messages can be sent to your provider as well.   To learn more about what you can do with MyChart, go to [email or web address].    Your next appointment:   Your physician wants you to follow-up in: 1 year with [name]. You will receive a reminder letter in the mail two months in advance. If you don't receive a letter, please call our office to schedule the follow-up appointment.    Other Instructions:

## 2020-10-01 NOTE — Progress Notes (Signed)
PCP: [name], MD   Primary EP: Dr Rayann Heman  Julie Gill is a 79 y.o. female who presents today for routine electrophysiology followup.  Since last being seen in our clinic, the patient reports doing very well.  Today, she denies symptoms of palpitations, chest pain, shortness of breath,  lower extremity edema, dizziness, presyncope, or syncope.  The patient is otherwise without complaint today.   Past Medical History:  Diagnosis Date  . Anxiety   . Arthritis   . Asthma    dx 10 yrs ago  . Atrial fibrillation (HCC)    CHADS VASC score 3. Intolerant to flecainide. s/p afib ablation 02/12/12  . Colon polyps   . Diastolic dysfunction   . Dysrhythmia    a fib   dx 2014  . GERD (gastroesophageal reflux disease)   . History of kidney infection     last one was approx 2009  . Hyperlipidemia   . Hypertension   . lung ca dx'd 08/2016  . Mild sleep apnea    wears NO equipment  . Mitral regurgitation    mild  . Neuromuscular disorder ([place])   . Obesity   . Plantar fasciitis   . Restless leg syndrome   . Sinus bradycardia   . Sleep apnea   . Stress bladder incontinence, female    WEARS SMALL PADS   Past Surgical History:  Procedure Laterality Date  . ABDOMINAL HYSTERECTOMY  1992   BSO  . APPENDECTOMY    . atrial fibrillation ablation  02/12/12   PVI by Dr Rayann Heman  . ATRIAL FIBRILLATION ABLATION N/A 02/12/2012   Procedure: ATRIAL FIBRILLATION ABLATION;  Surgeon: Thompson Grayer, MD;  Location: [facility] CATH LAB;  Service: Cardiovascular;  Laterality: N/A;  . BLADDER SUSPENSION    . CARPAL TUNNEL RELEASE Right 01/26/2020   Procedure: CARPAL TUNNEL RELEASE;  Surgeon: Daryll Brod, MD;  Location: [place];  Service: Orthopedics;  Laterality: Right;  . [place], 200,2004, 2007, 05/06/2011   adenomas  . heels spurs     x 2  . TEE WITHOUT CARDIOVERSION  02/11/2012   Procedure: TRANSESOPHAGEAL ECHOCARDIOGRAM (TEE);  Surgeon: Larey Dresser, MD;   Location: [place];  Service: Cardiovascular;  Laterality: N/A;  . TONSILLECTOMY AND ADENOIDECTOMY    . VARICOSE VEIN SURGERY    . VIDEO ASSISTED THORACOSCOPY (VATS)/WEDGE RESECTION Right 10/02/2016   Procedure: VIDEO ASSISTED THORACOSCOPY (VATS)/WEDGE RESECTION;  Surgeon: [name], MD;  Location: [facility];  Service: Thoracic;  Laterality: Right;    ROS- all systems are reviewed and negatives except as per HPI above  Current Outpatient Medications  Medication Sig Dispense Refill  . acetaminophen (TYLENOL) 500 MG tablet Take 500 mg by mouth every 6 (six) hours as needed.    . calcium carbonate (OS-CAL) 600 MG TABS Take 600 mg by mouth daily.     . Cholecalciferol (VITAMIN D PO) Take 4,000 Units by mouth daily.      . clonazePAM (KLONOPIN) 0.5 MG tablet Take 0.25 mg by mouth 2 (two) times daily as needed for anxiety.     . famotidine (PEPCID) 20 MG tablet Take 20 mg by mouth 2 (two) times daily.    . fish oil-omega-3 fatty acids 1000 MG capsule Take 1 g by mouth 2 (two) times daily.     . hydrochlorothiazide (MICROZIDE) 12.5 MG capsule Take 12.5 mg by mouth daily.    Marland Kitchen lisinopril (ZESTRIL) 20 MG tablet Take 20 mg by  mouth daily.    Marland Kitchen lovastatin (MEVACOR) 40 MG tablet Take 80 mg by mouth daily.     Marland Kitchen MAGNESIUM PO Take 250 mg by mouth daily.    . nitroGLYCERIN (NITROSTAT) 0.4 MG SL tablet Place 1 tablet (0.4 mg total) under the tongue every 5 (five) minutes as needed for chest pain. 25 tablet 1  . potassium chloride SA (K-DUR,KLOR-CON) 20 MEQ tablet Take 20 mEq by mouth daily.    Marland Kitchen senna (SENOKOT) 8.6 MG tablet Take 1 tablet by mouth daily.     . traMADol (ULTRAM) 50 MG tablet Take 1 tablet (50 mg total) by mouth every 6 (six) hours as needed. 20 tablet 0  . vitamin E (VITAMIN E) 400 UNIT capsule Take 400 Units by mouth daily.     Alveda Reasons 20 MG TABS tablet Take 20 mg by mouth daily with supper.      No current facility-administered medications for this visit.    Physical  Exam: Vitals:   10/01/20 1546  BP: (!) 148/82  Pulse: 69  SpO2: 97%  Weight: 191 lb 12.8 oz (87 kg)  Height: 5' 1.5" (1.562 m)    GEN- The patient is well appearing, alert and oriented x 3 today.   Head- normocephalic, atraumatic Eyes-  Sclera clear, conjunctiva pink Ears- hearing intact Oropharynx- clear Lungs- Clear to ausculation bilaterally, normal work of breathing Heart- Regular rate and rhythm, no murmurs, rubs or gallops, PMI not laterally displaced GI- soft, NT, ND, + BS Extremities- no clubbing, cyanosis, or edema  Wt Readings from Last 3 Encounters:  10/01/20 191 lb 12.8 oz (87 kg)  01/26/20 184 lb 11.9 oz (83.8 kg)  10/19/19 171 lb 1.6 oz (77.6 kg)    EKG tracing ordered today is personally reviewed and shows sinus rhythm  Assessment and Plan:  1. Paroxysmal atrial fibrillation Doing well post ablation off AAD therapy Continue xarelto for chads2vasc score of 4 bmet and cbc today  2. Overweight Body mass index is 35.65 kg/m. Lifestyle modification is advised  3. HTN Stable No change required today  4. HL Continue lovastatin  Risks, benefits and potential toxicities for medications prescribed and/or refilled reviewed with patient today.   Return to see [name] in a year  Thompson Grayer MD, [facility] 10/01/2020 4:04 PM

## 2020-10-11 DIAGNOSIS — I10 Essential (primary) hypertension: Secondary | ICD-10-CM | POA: Diagnosis not present

## 2020-10-11 DIAGNOSIS — G2581 Restless legs syndrome: Secondary | ICD-10-CM | POA: Diagnosis not present

## 2020-10-11 DIAGNOSIS — C3491 Malignant neoplasm of unspecified part of right bronchus or lung: Secondary | ICD-10-CM | POA: Diagnosis not present

## 2020-10-11 DIAGNOSIS — E782 Mixed hyperlipidemia: Secondary | ICD-10-CM | POA: Diagnosis not present

## 2020-10-11 DIAGNOSIS — J309 Allergic rhinitis, unspecified: Secondary | ICD-10-CM | POA: Diagnosis not present

## 2020-10-11 DIAGNOSIS — I480 Paroxysmal atrial fibrillation: Secondary | ICD-10-CM | POA: Diagnosis not present

## 2020-10-11 DIAGNOSIS — D6869 Other thrombophilia: Secondary | ICD-10-CM | POA: Diagnosis not present

## 2020-10-11 DIAGNOSIS — Z79899 Other long term (current) drug therapy: Secondary | ICD-10-CM | POA: Diagnosis not present

## 2020-10-11 DIAGNOSIS — R7309 Other abnormal glucose: Secondary | ICD-10-CM | POA: Diagnosis not present

## 2020-10-15 ENCOUNTER — Inpatient Hospital Stay: Payer: Medicare HMO | Attending: Internal Medicine

## 2020-10-15 ENCOUNTER — Ambulatory Visit (HOSPITAL_COMMUNITY)
Admission: RE | Admit: 2020-10-15 | Discharge: 2020-10-15 | Disposition: A | Discharge status: Home / Self Care | Payer: Medicare HMO | Source: Ambulatory Visit | Attending: Internal Medicine | Admitting: Internal Medicine

## 2020-10-15 ENCOUNTER — Encounter (HOSPITAL_COMMUNITY): Payer: Self-pay

## 2020-10-15 ENCOUNTER — Other Ambulatory Visit: Payer: Self-pay

## 2020-10-15 DIAGNOSIS — K219 Gastro-esophageal reflux disease without esophagitis: Secondary | ICD-10-CM | POA: Diagnosis not present

## 2020-10-15 DIAGNOSIS — C3490 Malignant neoplasm of unspecified part of unspecified bronchus or lung: Secondary | ICD-10-CM

## 2020-10-15 DIAGNOSIS — M1990 Unspecified osteoarthritis, unspecified site: Secondary | ICD-10-CM | POA: Diagnosis not present

## 2020-10-15 DIAGNOSIS — Z79899 Other long term (current) drug therapy: Secondary | ICD-10-CM | POA: Diagnosis not present

## 2020-10-15 DIAGNOSIS — F419 Anxiety disorder, unspecified: Secondary | ICD-10-CM | POA: Insufficient documentation

## 2020-10-15 DIAGNOSIS — C3411 Malignant neoplasm of upper lobe, right bronchus or lung: Secondary | ICD-10-CM | POA: Insufficient documentation

## 2020-10-15 DIAGNOSIS — Z9049 Acquired absence of other specified parts of digestive tract: Secondary | ICD-10-CM | POA: Insufficient documentation

## 2020-10-15 DIAGNOSIS — J984 Other disorders of lung: Secondary | ICD-10-CM | POA: Diagnosis not present

## 2020-10-15 DIAGNOSIS — E785 Hyperlipidemia, unspecified: Secondary | ICD-10-CM | POA: Diagnosis not present

## 2020-10-15 DIAGNOSIS — I4891 Unspecified atrial fibrillation: Secondary | ICD-10-CM | POA: Insufficient documentation

## 2020-10-15 DIAGNOSIS — J432 Centrilobular emphysema: Secondary | ICD-10-CM | POA: Diagnosis not present

## 2020-10-15 DIAGNOSIS — Z7901 Long term (current) use of anticoagulants: Secondary | ICD-10-CM | POA: Diagnosis not present

## 2020-10-15 DIAGNOSIS — G4730 Sleep apnea, unspecified: Secondary | ICD-10-CM | POA: Insufficient documentation

## 2020-10-15 DIAGNOSIS — R0609 Other forms of dyspnea: Secondary | ICD-10-CM | POA: Diagnosis not present

## 2020-10-15 DIAGNOSIS — I119 Hypertensive heart disease without heart failure: Secondary | ICD-10-CM | POA: Diagnosis not present

## 2020-10-15 DIAGNOSIS — I2510 Atherosclerotic heart disease of native coronary artery without angina pectoris: Secondary | ICD-10-CM | POA: Diagnosis not present

## 2020-10-15 LAB — CBC WITH DIFFERENTIAL (CANCER CENTER ONLY)
Abs Immature Granulocytes: 0.01 10*3/uL (ref 0.00–0.07)
Basophils Absolute: 0 10*3/uL (ref 0.0–0.1)
Basophils Relative: 0 %
Eosinophils Absolute: 0.1 10*3/uL (ref 0.0–0.5)
Eosinophils Relative: 1 %
HCT: 37.6 % (ref 36.0–46.0)
Hemoglobin: 12 g/dL (ref 12.0–15.0)
Immature Granulocytes: 0 %
Lymphocytes Relative: 28 %
Lymphs Abs: 1.7 10*3/uL (ref 0.7–4.0)
MCH: 29.5 pg (ref 26.0–34.0)
MCHC: 31.9 g/dL (ref 30.0–36.0)
MCV: 92.4 fL (ref 80.0–100.0)
Monocytes Absolute: 0.5 10*3/uL (ref 0.1–1.0)
Monocytes Relative: 8 %
Neutro Abs: 3.8 10*3/uL (ref 1.7–7.7)
Neutrophils Relative %: 63 %
Platelet Count: 239 10*3/uL (ref 150–400)
RBC: 4.07 MIL/uL (ref 3.87–5.11)
RDW: 13.3 % (ref 11.5–15.5)
WBC Count: 6 10*3/uL (ref 4.0–10.5)
nRBC: 0 % (ref 0.0–0.2)

## 2020-10-15 LAB — CMP (CANCER CENTER ONLY)
ALT: 17 U/L (ref 0–44)
AST: 17 U/L (ref 15–41)
Albumin: 3.8 g/dL (ref 3.5–5.0)
Alkaline Phosphatase: 94 U/L (ref 38–126)
Anion gap: 7 (ref 5–15)
BUN: 19 mg/dL (ref 8–23)
CO2: 29 mmol/L (ref 22–32)
Calcium: 9.4 mg/dL (ref 8.9–10.3)
Chloride: 106 mmol/L (ref 98–111)
Creatinine: 0.86 mg/dL (ref 0.44–1.00)
GFR, Estimated: >60 mL/min (ref >60)
Glucose, Bld: 105 mg/dL — ABNORMAL HIGH (ref 70–99)
Potassium: 4.3 mmol/L (ref 3.5–5.1)
Sodium: 142 mmol/L (ref 135–145)
Total Bilirubin: 0.4 mg/dL (ref 0.3–1.2)
Total Protein: 6.7 g/dL (ref 6.5–8.1)

## 2020-10-15 MED ORDER — IOHEXOL 300 MG/ML  SOLN
75.0000 mL | Freq: Once | INTRAMUSCULAR | Status: AC | PRN
  Administered 2020-10-15: 75 mL via INTRAVENOUS

## 2020-10-17 ENCOUNTER — Other Ambulatory Visit: Payer: Self-pay

## 2020-10-17 ENCOUNTER — Inpatient Hospital Stay: Payer: Medicare HMO | Admitting: Internal Medicine

## 2020-10-17 ENCOUNTER — Encounter: Payer: Self-pay | Admitting: Internal Medicine

## 2020-10-17 VITALS — BP 151/49 | HR 63 | Temp 97.8°F | Resp 20 | Ht 61.0 in | Wt 191.9 lb

## 2020-10-17 DIAGNOSIS — I10 Essential (primary) hypertension: Secondary | ICD-10-CM

## 2020-10-17 DIAGNOSIS — I4891 Unspecified atrial fibrillation: Secondary | ICD-10-CM | POA: Diagnosis not present

## 2020-10-17 DIAGNOSIS — G4730 Sleep apnea, unspecified: Secondary | ICD-10-CM | POA: Diagnosis not present

## 2020-10-17 DIAGNOSIS — C3490 Malignant neoplasm of unspecified part of unspecified bronchus or lung: Secondary | ICD-10-CM | POA: Diagnosis not present

## 2020-10-17 DIAGNOSIS — C3411 Malignant neoplasm of upper lobe, right bronchus or lung: Secondary | ICD-10-CM | POA: Diagnosis not present

## 2020-10-17 DIAGNOSIS — Z85118 Personal history of other malignant neoplasm of bronchus and lung: Secondary | ICD-10-CM

## 2020-10-17 DIAGNOSIS — C3491 Malignant neoplasm of unspecified part of right bronchus or lung: Secondary | ICD-10-CM | POA: Diagnosis not present

## 2020-10-17 DIAGNOSIS — I119 Hypertensive heart disease without heart failure: Secondary | ICD-10-CM | POA: Diagnosis not present

## 2020-10-17 DIAGNOSIS — Z902 Acquired absence of lung [part of]: Secondary | ICD-10-CM | POA: Diagnosis not present

## 2020-10-17 DIAGNOSIS — Z9049 Acquired absence of other specified parts of digestive tract: Secondary | ICD-10-CM | POA: Diagnosis not present

## 2020-10-17 DIAGNOSIS — R0609 Other forms of dyspnea: Secondary | ICD-10-CM | POA: Diagnosis not present

## 2020-10-17 DIAGNOSIS — E785 Hyperlipidemia, unspecified: Secondary | ICD-10-CM | POA: Diagnosis not present

## 2020-10-17 DIAGNOSIS — M1990 Unspecified osteoarthritis, unspecified site: Secondary | ICD-10-CM | POA: Diagnosis not present

## 2020-10-17 DIAGNOSIS — K219 Gastro-esophageal reflux disease without esophagitis: Secondary | ICD-10-CM | POA: Diagnosis not present

## 2020-10-17 NOTE — Progress Notes (Signed)
[place] Telephone:([phone]   [phone]  OFFICE PROGRESS NOTE  [name], [place] [address]  DIAGNOSIS: Stage IB (T1c, N0, M0) non-small cell lung cancer, adenocarcinoma but the patient also has multifocal groundglass opacities in the lung bilaterally highly concerning for low-grade adenocarcinoma as well.  Genomic Alteration Identified KRAS G12V Additional Findings Microsatellite status MS-Stable Tumor Mutation Burden TMB-Intermediate; 11 Muts/Mb Additional Disease-relevant Genes with No Reportable Alterations Identified EGFR ALK BRAF MET RET ERBB2 ROS1   PDL1 expression 5%.  PRIOR THERAPY: Status post wedge resection of the right upper lobe on 10/02/2016 under the care of Dr. Roxan Hockey.  CURRENT THERAPY: Observation.  INTERVAL HISTORY: Julie Gill 79 y.o. female returns to the clinic today for follow-up visit.  The patient is feeling fine today with no concerning complaints except for shortness of breath with exertion.  She denied having any chest pain, cough or hemoptysis.  She denied having any fever or chills.  She has no nausea, vomiting, diarrhea or constipation.  She denied having any headache or visual changes.  She had repeat CT scan of the chest performed recently and she is here for evaluation and discussion of her scan results.   MEDICAL HISTORY: Past Medical History:  Diagnosis Date   Anxiety    Arthritis    Asthma    dx 10 yrs ago   Atrial fibrillation (HCC)    CHADS VASC score 3. Intolerant to flecainide. s/p afib ablation 02/12/12   Colon polyps    Diastolic dysfunction    Dysrhythmia    a fib   dx 2014   GERD (gastroesophageal reflux disease)    History of kidney infection     last one was approx 2009   Hyperlipidemia    Hypertension    lung ca dx'd 08/2016   Mild sleep apnea    wears NO equipment   Mitral regurgitation    mild   Neuromuscular  disorder (HCC)    Obesity    Plantar fasciitis    Restless leg syndrome    Sinus bradycardia    Sleep apnea    Stress bladder incontinence, female    WEARS SMALL PADS    ALLERGIES:  is allergic to codeine and prednisone.  MEDICATIONS:  Current Outpatient Medications  Medication Sig Dispense Refill   acetaminophen (TYLENOL) 500 MG tablet Take 500 mg by mouth every 6 (six) hours as needed.     calcium carbonate (OS-CAL) 600 MG TABS Take 600 mg by mouth daily.      Cholecalciferol (VITAMIN D PO) Take 4,000 Units by mouth daily.       clonazePAM (KLONOPIN) 0.5 MG tablet Take 0.25 mg by mouth 2 (two) times daily as needed for anxiety.      famotidine (PEPCID) 20 MG tablet Take 20 mg by mouth 2 (two) times daily.     fish oil-omega-3 fatty acids 1000 MG capsule Take 1 g by mouth 2 (two) times daily.      hydrochlorothiazide (MICROZIDE) 12.5 MG capsule Take 12.5 mg by mouth daily.     lisinopril (ZESTRIL) 20 MG tablet Take 20 mg by mouth daily.     lovastatin (MEVACOR) 40 MG tablet Take 80 mg by mouth daily.      MAGNESIUM PO Take 250 mg by mouth daily.     nitroGLYCERIN (NITROSTAT) 0.4 MG SL tablet Place 1 tablet (0.4 mg total) under the tongue every 5 (five) minutes as  needed for chest pain. 25 tablet 1   potassium chloride SA (K-DUR,KLOR-CON) 20 MEQ tablet Take 20 mEq by mouth daily.     senna (SENOKOT) 8.6 MG tablet Take 1 tablet by mouth daily.      traMADol (ULTRAM) 50 MG tablet Take 1 tablet (50 mg total) by mouth every 6 (six) hours as needed. 20 tablet 0   vitamin E (VITAMIN E) 400 UNIT capsule Take 400 Units by mouth daily.      XARELTO 20 MG TABS tablet Take 20 mg by mouth daily with supper.      No current facility-administered medications for this visit.    SURGICAL HISTORY:  Past Surgical History:  Procedure Laterality Date   ABDOMINAL HYSTERECTOMY  1992   BSO   APPENDECTOMY     atrial fibrillation ablation  02/12/12   PVI by Dr Rayann Heman    ATRIAL FIBRILLATION ABLATION N/A 02/12/2012   Procedure: ATRIAL FIBRILLATION ABLATION;  Surgeon: Thompson Grayer, MD;  Location: [facility] CATH LAB;  Service: Cardiovascular;  Laterality: N/A;   BLADDER SUSPENSION     CARPAL TUNNEL RELEASE Right 01/26/2020   Procedure: CARPAL TUNNEL RELEASE;  Surgeon: Daryll Brod, MD;  Location: [place];  Service: Orthopedics;  Laterality: Right;   [place], 200,2004, 2007, 05/06/2011   adenomas   heels spurs     x 2   TEE WITHOUT CARDIOVERSION  02/11/2012   Procedure: TRANSESOPHAGEAL ECHOCARDIOGRAM (TEE);  Surgeon: Larey Dresser, MD;  Location: [place];  Service: Cardiovascular;  Laterality: N/A;   TONSILLECTOMY AND ADENOIDECTOMY     VARICOSE VEIN SURGERY     VIDEO ASSISTED THORACOSCOPY (VATS)/WEDGE RESECTION Right 10/02/2016   Procedure: VIDEO ASSISTED THORACOSCOPY (VATS)/WEDGE RESECTION;  Surgeon: [name], MD;  Location: [facility];  Service: Thoracic;  Laterality: Right;    REVIEW OF SYSTEMS:  A comprehensive review of systems was negative except for: Respiratory: positive for dyspnea on exertion   PHYSICAL EXAMINATION: General appearance: alert, cooperative and no distress Head: Normocephalic, without obvious abnormality, atraumatic Neck: no adenopathy, no JVD, supple, symmetrical, trachea midline and thyroid not enlarged, symmetric, no tenderness/mass/nodules Lymph nodes: Cervical, supraclavicular, and axillary nodes normal. Resp: clear to auscultation bilaterally Back: symmetric, no curvature. ROM normal. No CVA tenderness. Cardio: regular rate and rhythm, S1, S2 normal, no murmur, click, rub or gallop GI: soft, non-tender; bowel sounds normal; no masses,  no organomegaly Extremities: extremities normal, atraumatic, no cyanosis or edema  ECOG PERFORMANCE STATUS: 1 - Symptomatic but completely ambulatory  Blood pressure (!) 151/49, pulse 63, temperature 97.8 F (36.6 C), temperature source  Tympanic, resp. rate 20, height 5' 1"  (1.549 m), weight 191 lb 14.4 oz (87 kg), SpO2 99 %.  LABORATORY DATA: Lab Results  Component Value Date   WBC 6.0 10/15/2020   HGB 12.0 10/15/2020   HCT 37.6 10/15/2020   MCV 92.4 10/15/2020   PLT 239 10/15/2020      Chemistry      Component Value Date/Time   NA 142 10/15/2020 1052   NA 142 09/28/2017 0959   K 4.3 10/15/2020 1052   K 4.4 09/28/2017 0959   CL 106 10/15/2020 1052   CO2 29 10/15/2020 1052   CO2 28 09/28/2017 0959   BUN 19 10/15/2020 1052   BUN 21.3 09/28/2017 0959   CREATININE 0.86 10/15/2020 1052   CREATININE 0.9 09/28/2017 0959      Component Value Date/Time   CALCIUM 9.4 10/15/2020 1052   CALCIUM 9.9  09/28/2017 0959   ALKPHOS 94 10/15/2020 1052   ALKPHOS 87 09/28/2017 0959   AST 17 10/15/2020 1052   AST 24 09/28/2017 0959   ALT 17 10/15/2020 1052   ALT 24 09/28/2017 0959   BILITOT 0.4 10/15/2020 1052   BILITOT 0.33 09/28/2017 0959       RADIOGRAPHIC STUDIES: CT Chest W Contrast  Result Date: 10/15/2020 CLINICAL DATA:  Lung cancer. EXAM: CT CHEST WITH CONTRAST TECHNIQUE: Multidetector CT imaging of the chest was performed during intravenous contrast administration. CONTRAST:  60m OMNIPAQUE IOHEXOL 300 MG/ML  SOLN COMPARISON:  10/17/2019. FINDINGS: Cardiovascular: Atherosclerotic calcification of the aorta, aortic valve and coronary arteries. Heart is enlarged. No pericardial effusion. Mediastinum/Nodes: No pathologically enlarged mediastinal, hilar or axillary lymph nodes. Esophagus is unremarkable. Small hiatal hernia. Lungs/Pleura: Postoperative scarring in the right upper lobe. Centrilobular emphysema. Peribronchovascular nodularity, mild bronchiolectasis and volume loss in the right middle lobe and lingula, unchanged. Scattered vague areas of nodular ground-glass measure up to 12 mm in the posterior right lower lobe (6/95), unchanged. Other solid-appearing nodules measure up to 6 mm in the superior segment left  lower lobe (6/68), stable. No pleural fluid. Airway is unremarkable. Upper Abdomen: 1.4 cm low-attenuation lesion in the left hepatic lobe is likely a cyst. Visualized portions of the liver, gallbladder, adrenal glands, kidneys, spleen, pancreas, stomach and bowel are otherwise unremarkable. 9 mm right renal artery aneurysm. Musculoskeletal: Degenerative changes in the spine. No worrisome lytic or sclerotic lesions. IMPRESSION: 1. Postoperative scarring in the right upper lobe. No evidence of recurrent or metastatic disease. 2. Multiple scattered areas of nodular ground-glass in the lungs bilaterally, stable. Adenocarcinoma cannot be excluded. Continued attention on follow-up exams is warranted. 3. 9 mm right renal artery aneurysm. 4. Aortic atherosclerosis (ICD10-I70.0). Coronary artery calcification. 5.  Emphysema (ICD10-J43.9). Electronically Signed   By: MLorin PicketM.D.   On: 10/15/2020 13:11    ASSESSMENT AND PLAN:  This is a very pleasant 79years old white female with stage IB non-small cell lung cancer, adenocarcinoma with no actionable mutations and several other groundglass opacities bilaterally.  She is status post wedge resection of the right upper lobe nodule. She is currently on observation and she is feeling fine. She had repeat CT scan of the chest performed recently.  I personally and independently reviewed the scans and discussed the results with the patient today. Her scan showed no concerning findings for disease recurrence or metastasis. I recommended for the patient to continue on observation with repeat CT scan of the chest in 1 year. She was advised to call immediately if she has any concerning symptoms in the interval. The patient voices understanding of current disease status and treatment options and is in agreement with the current care plan.  All questions were answered. The patient knows to call the clinic with any problems, questions or concerns. We can certainly see  the patient much sooner if necessary.  Disclaimer: This note was dictated with voice recognition software. Similar sounding words can inadvertently be transcribed and may not be corrected upon review.

## 2020-10-18 DIAGNOSIS — I10 Essential (primary) hypertension: Secondary | ICD-10-CM | POA: Diagnosis not present

## 2020-10-18 DIAGNOSIS — R7309 Other abnormal glucose: Secondary | ICD-10-CM | POA: Diagnosis not present

## 2020-10-18 DIAGNOSIS — E782 Mixed hyperlipidemia: Secondary | ICD-10-CM | POA: Diagnosis not present

## 2020-10-18 DIAGNOSIS — J309 Allergic rhinitis, unspecified: Secondary | ICD-10-CM | POA: Diagnosis not present

## 2020-10-18 DIAGNOSIS — C3491 Malignant neoplasm of unspecified part of right bronchus or lung: Secondary | ICD-10-CM | POA: Diagnosis not present

## 2020-10-18 DIAGNOSIS — I480 Paroxysmal atrial fibrillation: Secondary | ICD-10-CM | POA: Diagnosis not present

## 2020-10-18 DIAGNOSIS — Z23 Encounter for immunization: Secondary | ICD-10-CM | POA: Diagnosis not present

## 2020-10-18 DIAGNOSIS — G2581 Restless legs syndrome: Secondary | ICD-10-CM | POA: Diagnosis not present

## 2020-10-18 DIAGNOSIS — D6869 Other thrombophilia: Secondary | ICD-10-CM | POA: Diagnosis not present

## 2020-10-29 ENCOUNTER — Telehealth: Payer: Self-pay | Admitting: Registered"

## 2020-10-29 NOTE — Telephone Encounter (Signed)
Pt called back regarding MDPP log in. Dietitian went through virtual log in process for class and answered pt's questions. Pt has no further concerns or questions about virtual MDPP and plans to attend informational session on Friday, 12/3 at 3:30 PM.

## 2020-10-29 NOTE — Telephone Encounter (Signed)
Called to follow-up regarding questions about MDPP Informational Session. No answer, no VM. Will call back at another time.

## 2020-10-30 DIAGNOSIS — I10 Essential (primary) hypertension: Secondary | ICD-10-CM | POA: Diagnosis not present

## 2020-10-30 DIAGNOSIS — J45909 Unspecified asthma, uncomplicated: Secondary | ICD-10-CM | POA: Diagnosis not present

## 2020-10-30 DIAGNOSIS — D508 Other iron deficiency anemias: Secondary | ICD-10-CM | POA: Diagnosis not present

## 2020-10-30 DIAGNOSIS — K219 Gastro-esophageal reflux disease without esophagitis: Secondary | ICD-10-CM | POA: Diagnosis not present

## 2020-10-30 DIAGNOSIS — I480 Paroxysmal atrial fibrillation: Secondary | ICD-10-CM | POA: Diagnosis not present

## 2020-10-30 DIAGNOSIS — C3491 Malignant neoplasm of unspecified part of right bronchus or lung: Secondary | ICD-10-CM | POA: Diagnosis not present

## 2020-10-30 DIAGNOSIS — E782 Mixed hyperlipidemia: Secondary | ICD-10-CM | POA: Diagnosis not present

## 2020-10-30 DIAGNOSIS — F324 Major depressive disorder, single episode, in partial remission: Secondary | ICD-10-CM | POA: Diagnosis not present

## 2020-11-02 DIAGNOSIS — E669 Obesity, unspecified: Secondary | ICD-10-CM | POA: Diagnosis not present

## 2020-11-02 DIAGNOSIS — I480 Paroxysmal atrial fibrillation: Secondary | ICD-10-CM | POA: Diagnosis not present

## 2020-11-02 DIAGNOSIS — I10 Essential (primary) hypertension: Secondary | ICD-10-CM | POA: Diagnosis not present

## 2020-11-02 DIAGNOSIS — D6869 Other thrombophilia: Secondary | ICD-10-CM | POA: Diagnosis not present

## 2020-11-02 DIAGNOSIS — E782 Mixed hyperlipidemia: Secondary | ICD-10-CM | POA: Diagnosis not present

## 2020-11-02 DIAGNOSIS — G2581 Restless legs syndrome: Secondary | ICD-10-CM | POA: Diagnosis not present

## 2020-11-02 DIAGNOSIS — J309 Allergic rhinitis, unspecified: Secondary | ICD-10-CM | POA: Diagnosis not present

## 2020-11-02 DIAGNOSIS — Z79899 Other long term (current) drug therapy: Secondary | ICD-10-CM | POA: Diagnosis not present

## 2020-11-02 DIAGNOSIS — C3491 Malignant neoplasm of unspecified part of right bronchus or lung: Secondary | ICD-10-CM | POA: Diagnosis not present

## 2020-11-08 DIAGNOSIS — I480 Paroxysmal atrial fibrillation: Secondary | ICD-10-CM | POA: Diagnosis not present

## 2020-11-08 DIAGNOSIS — C3491 Malignant neoplasm of unspecified part of right bronchus or lung: Secondary | ICD-10-CM | POA: Diagnosis not present

## 2020-11-08 DIAGNOSIS — D508 Other iron deficiency anemias: Secondary | ICD-10-CM | POA: Diagnosis not present

## 2020-11-08 DIAGNOSIS — K219 Gastro-esophageal reflux disease without esophagitis: Secondary | ICD-10-CM | POA: Diagnosis not present

## 2020-11-08 DIAGNOSIS — I10 Essential (primary) hypertension: Secondary | ICD-10-CM | POA: Diagnosis not present

## 2020-11-08 DIAGNOSIS — J45909 Unspecified asthma, uncomplicated: Secondary | ICD-10-CM | POA: Diagnosis not present

## 2020-11-08 DIAGNOSIS — E782 Mixed hyperlipidemia: Secondary | ICD-10-CM | POA: Diagnosis not present

## 2020-11-08 DIAGNOSIS — F324 Major depressive disorder, single episode, in partial remission: Secondary | ICD-10-CM | POA: Diagnosis not present

## 2020-11-21 DIAGNOSIS — Z961 Presence of intraocular lens: Secondary | ICD-10-CM | POA: Diagnosis not present

## 2020-11-21 DIAGNOSIS — Z0100 Encounter for examination of eyes and vision without abnormal findings: Secondary | ICD-10-CM | POA: Diagnosis not present

## 2020-11-21 DIAGNOSIS — H524 Presbyopia: Secondary | ICD-10-CM | POA: Diagnosis not present

## 2020-12-06 DIAGNOSIS — C3491 Malignant neoplasm of unspecified part of right bronchus or lung: Secondary | ICD-10-CM | POA: Diagnosis not present

## 2020-12-06 DIAGNOSIS — F324 Major depressive disorder, single episode, in partial remission: Secondary | ICD-10-CM | POA: Diagnosis not present

## 2020-12-06 DIAGNOSIS — I480 Paroxysmal atrial fibrillation: Secondary | ICD-10-CM | POA: Diagnosis not present

## 2020-12-06 DIAGNOSIS — E782 Mixed hyperlipidemia: Secondary | ICD-10-CM | POA: Diagnosis not present

## 2020-12-06 DIAGNOSIS — J45909 Unspecified asthma, uncomplicated: Secondary | ICD-10-CM | POA: Diagnosis not present

## 2020-12-06 DIAGNOSIS — D508 Other iron deficiency anemias: Secondary | ICD-10-CM | POA: Diagnosis not present

## 2020-12-06 DIAGNOSIS — K219 Gastro-esophageal reflux disease without esophagitis: Secondary | ICD-10-CM | POA: Diagnosis not present

## 2020-12-06 DIAGNOSIS — I10 Essential (primary) hypertension: Secondary | ICD-10-CM | POA: Diagnosis not present

## 2021-01-02 DIAGNOSIS — I480 Paroxysmal atrial fibrillation: Secondary | ICD-10-CM | POA: Diagnosis not present

## 2021-01-02 DIAGNOSIS — I10 Essential (primary) hypertension: Secondary | ICD-10-CM | POA: Diagnosis not present

## 2021-01-02 DIAGNOSIS — C3491 Malignant neoplasm of unspecified part of right bronchus or lung: Secondary | ICD-10-CM | POA: Diagnosis not present

## 2021-01-02 DIAGNOSIS — J45909 Unspecified asthma, uncomplicated: Secondary | ICD-10-CM | POA: Diagnosis not present

## 2021-01-02 DIAGNOSIS — D508 Other iron deficiency anemias: Secondary | ICD-10-CM | POA: Diagnosis not present

## 2021-01-02 DIAGNOSIS — K219 Gastro-esophageal reflux disease without esophagitis: Secondary | ICD-10-CM | POA: Diagnosis not present

## 2021-01-02 DIAGNOSIS — E782 Mixed hyperlipidemia: Secondary | ICD-10-CM | POA: Diagnosis not present

## 2021-01-02 DIAGNOSIS — F324 Major depressive disorder, single episode, in partial remission: Secondary | ICD-10-CM | POA: Diagnosis not present

## 2021-01-10 DIAGNOSIS — H26493 Other secondary cataract, bilateral: Secondary | ICD-10-CM | POA: Diagnosis not present

## 2021-01-10 DIAGNOSIS — H26491 Other secondary cataract, right eye: Secondary | ICD-10-CM | POA: Diagnosis not present

## 2021-01-10 DIAGNOSIS — H02831 Dermatochalasis of right upper eyelid: Secondary | ICD-10-CM | POA: Diagnosis not present

## 2021-01-10 DIAGNOSIS — Z961 Presence of intraocular lens: Secondary | ICD-10-CM | POA: Diagnosis not present

## 2021-01-10 DIAGNOSIS — H18413 Arcus senilis, bilateral: Secondary | ICD-10-CM | POA: Diagnosis not present

## 2021-01-17 ENCOUNTER — Other Ambulatory Visit: Payer: Self-pay | Admitting: Family Medicine

## 2021-01-17 DIAGNOSIS — Z1231 Encounter for screening mammogram for malignant neoplasm of breast: Secondary | ICD-10-CM

## 2021-02-12 ENCOUNTER — Telehealth: Payer: Self-pay | Admitting: Internal Medicine

## 2021-02-12 NOTE — Telephone Encounter (Signed)
Patient was checking her HR with a pulse ox. She has not missed any Xarelto. The patient has not increased caffeine and doesn't drink alcohol. Started eating healthier about a month ago and losing weight. Has not had an episode like this since her ablation (2013) wanted to make Dr. Rayann Heman aware. Current vitals 145/66 BP, HR 65.

## 2021-02-12 NOTE — Telephone Encounter (Signed)
Patient called to say Sunday she experience Afib that lasted abuot 35-40 mins, patient stated at first it was 99 but during the Afib it got up to 146. The only symptoms she felt sick on her stomach. Please advise

## 2021-02-13 NOTE — Telephone Encounter (Signed)
Dr. Rayann Heman doesn't want to make any changes at this time. The patient is over due for follow up in office so Dr. Rayann Heman would like her to come in.   Left voice mail to call back

## 2021-02-22 DIAGNOSIS — N952 Postmenopausal atrophic vaginitis: Secondary | ICD-10-CM | POA: Diagnosis not present

## 2021-02-22 DIAGNOSIS — D508 Other iron deficiency anemias: Secondary | ICD-10-CM | POA: Diagnosis not present

## 2021-02-22 DIAGNOSIS — N898 Other specified noninflammatory disorders of vagina: Secondary | ICD-10-CM | POA: Diagnosis not present

## 2021-02-22 DIAGNOSIS — Z01419 Encounter for gynecological examination (general) (routine) without abnormal findings: Secondary | ICD-10-CM | POA: Diagnosis not present

## 2021-02-22 DIAGNOSIS — I4891 Unspecified atrial fibrillation: Secondary | ICD-10-CM | POA: Diagnosis not present

## 2021-02-22 DIAGNOSIS — Z0000 Encounter for general adult medical examination without abnormal findings: Secondary | ICD-10-CM | POA: Diagnosis not present

## 2021-02-27 ENCOUNTER — Encounter: Payer: Self-pay | Admitting: Internal Medicine

## 2021-02-27 ENCOUNTER — Encounter: Payer: Self-pay | Admitting: *Deleted

## 2021-02-27 ENCOUNTER — Ambulatory Visit: Payer: Medicare HMO | Admitting: Internal Medicine

## 2021-02-27 ENCOUNTER — Other Ambulatory Visit: Payer: Self-pay

## 2021-02-27 VITALS — BP 136/72 | HR 56 | Ht 61.0 in | Wt 185.0 lb

## 2021-02-27 DIAGNOSIS — E785 Hyperlipidemia, unspecified: Secondary | ICD-10-CM | POA: Diagnosis not present

## 2021-02-27 DIAGNOSIS — I480 Paroxysmal atrial fibrillation: Secondary | ICD-10-CM

## 2021-02-27 DIAGNOSIS — I10 Essential (primary) hypertension: Secondary | ICD-10-CM

## 2021-02-27 DIAGNOSIS — R079 Chest pain, unspecified: Secondary | ICD-10-CM

## 2021-02-27 DIAGNOSIS — D6869 Other thrombophilia: Secondary | ICD-10-CM | POA: Diagnosis not present

## 2021-02-27 NOTE — Patient Instructions (Addendum)
Medication Instructions:  Your physician recommends that you continue on your current medications as directed. Please refer to the Current Medication list given to you today.  Labwork: None ordered.  Testing/Procedures: Your physician has requested that you have a lexiscan myoview. For further information please visit [email or web address]. Please follow instruction sheet, as given.  Your physician has requested that you have an echocardiogram. Echocardiography is a painless test that uses sound waves to create images of your heart. It provides your doctor with information about the size and shape of your heart and how well your heart's chambers and valves are working. This procedure takes approximately one hour. There are no restrictions for this procedure.    Follow-Up:  Your physician wants you to follow-up in: Afib Clinic in 6 months. They will contact you to schedule.    You will receive a reminder letter in the mail two months in advance. If you don't receive a letter, please call our office to schedule the follow-up appointment.   Any Other Special Instructions Will Be Listed Below (If Applicable).  If you need a refill on your cardiac medications before your next appointment, please call your pharmacy.

## 2021-02-27 NOTE — Progress Notes (Signed)
PCP: [name], MD   Primary EP: Dr Rayann Heman  Julie Gill is a 80 y.o. female who presents today for routine electrophysiology followup.  Since last being seen in our clinic, the patient reports doing very well.  She did have palpitations about 3 weeks ago, lasting 45 minutes.  She was very worried.  She thinks that this was afib.   She has occasional chest discomfort.  She finds that this occurs at rest.  She also has noticed L arm numbness and discomfort intermittently.  Today, she denies symptoms of exertional chest pain, shortness of breath,  lower extremity edema, dizziness, presyncope, or syncope.  The patient is otherwise without complaint today.   Past Medical History:  Diagnosis Date  . Anxiety   . Arthritis   . Asthma    dx 10 yrs ago  . Atrial fibrillation (HCC)    CHADS VASC score 3. Intolerant to flecainide. s/p afib ablation 02/12/12  . Colon polyps   . Diastolic dysfunction   . Dysrhythmia    a fib   dx 2014  . GERD (gastroesophageal reflux disease)   . History of kidney infection     last one was approx 2009  . Hyperlipidemia   . Hypertension   . lung ca dx'd 08/2016  . Mild sleep apnea    wears NO equipment  . Mitral regurgitation    mild  . Neuromuscular disorder ([place])   . Obesity   . Plantar fasciitis   . Restless leg syndrome   . Sinus bradycardia   . Sleep apnea   . Stress bladder incontinence, female    WEARS SMALL PADS   Past Surgical History:  Procedure Laterality Date  . ABDOMINAL HYSTERECTOMY  1992   BSO  . APPENDECTOMY    . atrial fibrillation ablation  02/12/12   PVI by Dr Rayann Heman  . ATRIAL FIBRILLATION ABLATION N/A 02/12/2012   Procedure: ATRIAL FIBRILLATION ABLATION;  Surgeon: Thompson Grayer, MD;  Location: [facility] CATH LAB;  Service: Cardiovascular;  Laterality: N/A;  . BLADDER SUSPENSION    . CARPAL TUNNEL RELEASE Right 01/26/2020   Procedure: CARPAL TUNNEL RELEASE;  Surgeon: Daryll Brod, MD;  Location: [facility];  Service:  Orthopedics;  Laterality: Right;  . [place], 200,2004, 2007, 05/06/2011   adenomas  . heels spurs     x 2  . TEE WITHOUT CARDIOVERSION  02/11/2012   Procedure: TRANSESOPHAGEAL ECHOCARDIOGRAM (TEE);  Surgeon: Larey Dresser, MD;  Location: [facility];  Service: Cardiovascular;  Laterality: N/A;  . TONSILLECTOMY AND ADENOIDECTOMY    . VARICOSE VEIN SURGERY    . VIDEO ASSISTED THORACOSCOPY (VATS)/WEDGE RESECTION Right 10/02/2016   Procedure: VIDEO ASSISTED THORACOSCOPY (VATS)/WEDGE RESECTION;  Surgeon: [name], MD;  Location: [facility];  Service: Thoracic;  Laterality: Right;    ROS- all systems are reviewed and negatives except as per HPI above  Current Outpatient Medications  Medication Sig Dispense Refill  . acetaminophen (TYLENOL) 500 MG tablet Take 500 mg by mouth every 6 (six) hours as needed.    . calcium carbonate (OS-CAL) 600 MG TABS Take 600 mg by mouth daily.    . Cholecalciferol (VITAMIN D PO) Take 4,000 Units by mouth daily.      . clonazePAM (KLONOPIN) 0.5 MG tablet Take 0.25 mg by mouth 2 (two) times daily as needed for anxiety.     . famotidine (PEPCID) 20 MG tablet Take 20 mg by mouth 2 (two) times daily.    Marland Kitchen  fish oil-omega-3 fatty acids 1000 MG capsule Take 1 g by mouth 2 (two) times daily.    . hydrochlorothiazide (MICROZIDE) 12.5 MG capsule Take 12.5 mg by mouth daily.    Marland Kitchen lisinopril (ZESTRIL) 20 MG tablet Take 20 mg by mouth daily.    Marland Kitchen lovastatin (MEVACOR) 40 MG tablet Take 80 mg by mouth daily.     Marland Kitchen MAGNESIUM PO Take 250 mg by mouth daily.    . nitroGLYCERIN (NITROSTAT) 0.4 MG SL tablet Place 1 tablet (0.4 mg total) under the tongue every 5 (five) minutes as needed for chest pain. 25 tablet 1  . potassium chloride SA (K-DUR,KLOR-CON) 20 MEQ tablet Take 20 mEq by mouth daily.    Marland Kitchen senna (SENOKOT) 8.6 MG tablet Take 1 tablet by mouth daily.    . traMADol (ULTRAM) 50 MG tablet Take 1 tablet (50 mg total) by mouth every 6 (six) hours  as needed. 20 tablet 0  . vitamin E 180 MG (400 UNITS) capsule Take 400 Units by mouth daily.     Alveda Reasons 20 MG TABS tablet Take 20 mg by mouth daily with supper.      No current facility-administered medications for this visit.    Physical Exam: Vitals:   02/27/21 1431  BP: 136/72  Pulse: (!) 56  SpO2: 97%  Weight: 185 lb (83.9 kg)  Height: 5\' 1"  (1.549 m)    GEN- The patient is well appearing, alert and oriented x 3 today.   Head- normocephalic, atraumatic Eyes-  Sclera clear, conjunctiva pink Ears- hearing intact Oropharynx- clear Lungs- Clear to ausculation bilaterally, normal work of breathing Heart- Regular rate and rhythm, no murmurs, rubs or gallops, PMI not laterally displaced GI- soft, NT, ND, + BS Extremities- no clubbing, cyanosis, or edema  Wt Readings from Last 3 Encounters:  02/27/21 185 lb (83.9 kg)  10/17/20 191 lb 14.4 oz (87 kg)  10/01/20 191 lb 12.8 oz (87 kg)    EKG tracing ordered today is personally reviewed and shows sinus bradycardia 56 bpm, PR 128 msec, QRS 86 msec, QTc 405 msec  Assessment and Plan:  1. Paroxysmal atrial fibrillation She continues to do well post ablation off AAD therapy chads2vasc score is 4.  She is on xarelto I have offered prn diltiazem which she declined today Echo is ordered today  2 HTN Stable No change required today Echo is ordered  3. HL Continue statin  4. Overweight Body mass index is 34.96 kg/m. Lifestyle modification advised  5. Chest pain Both typical and atypical features. lexiscan myoview is ordered today  Risks, benefits and potential toxicities for medications prescribed and/or refilled reviewed with patient today.   Return to see AF clinic team every 6 months  Thompson Grayer MD, [facility] 02/27/2021 2:31 PM

## 2021-03-05 ENCOUNTER — Other Ambulatory Visit: Payer: Self-pay

## 2021-03-05 ENCOUNTER — Ambulatory Visit
Admission: RE | Admit: 2021-03-05 | Discharge: 2021-03-05 | Disposition: A | Discharge status: Home / Self Care | Payer: Medicare HMO | Source: Ambulatory Visit | Attending: Family Medicine | Admitting: Family Medicine

## 2021-03-05 DIAGNOSIS — Z1231 Encounter for screening mammogram for malignant neoplasm of breast: Secondary | ICD-10-CM

## 2021-03-07 ENCOUNTER — Telehealth (HOSPITAL_COMMUNITY): Payer: Self-pay

## 2021-03-07 NOTE — Telephone Encounter (Signed)
Detailed instructions left on the patient's answering machine. Asked to call back with any questions. S.Kennedee Kitzmiller EMTP 

## 2021-03-08 ENCOUNTER — Other Ambulatory Visit: Payer: Self-pay | Admitting: Family Medicine

## 2021-03-08 DIAGNOSIS — R928 Other abnormal and inconclusive findings on diagnostic imaging of breast: Secondary | ICD-10-CM

## 2021-03-12 ENCOUNTER — Ambulatory Visit (HOSPITAL_COMMUNITY): Payer: Medicare HMO | Attending: Cardiology

## 2021-03-12 ENCOUNTER — Other Ambulatory Visit: Payer: Self-pay

## 2021-03-12 DIAGNOSIS — R079 Chest pain, unspecified: Secondary | ICD-10-CM | POA: Diagnosis not present

## 2021-03-12 LAB — MYOCARDIAL PERFUSION IMAGING
LV dias vol: 73 mL (ref 46–106)
LV sys vol: 20 mL
Peak HR: 77 {beats}/min
Rest HR: 50 {beats}/min
SDS: 0
SRS: 0
SSS: 0
TID: 0.95

## 2021-03-12 MED ORDER — TECHNETIUM TC 99M TETROFOSMIN IV KIT
30.2000 | PACK | Freq: Once | INTRAVENOUS | Status: AC | PRN
  Administered 2021-03-12: 30.2 via INTRAVENOUS
  Filled 2021-03-12: qty 31

## 2021-03-12 MED ORDER — REGADENOSON 0.4 MG/5ML IV SOLN
0.4000 mg | Freq: Once | INTRAVENOUS | Status: AC
  Administered 2021-03-12: 0.4 mg via INTRAVENOUS

## 2021-03-12 MED ORDER — TECHNETIUM TC 99M TETROFOSMIN IV KIT
10.2000 | PACK | Freq: Once | INTRAVENOUS | Status: AC | PRN
  Administered 2021-03-12: 10.2 via INTRAVENOUS
  Filled 2021-03-12: qty 11

## 2021-03-13 ENCOUNTER — Ambulatory Visit: Payer: Medicare HMO

## 2021-03-13 ENCOUNTER — Ambulatory Visit
Admission: RE | Admit: 2021-03-13 | Discharge: 2021-03-13 | Disposition: A | Discharge status: Home / Self Care | Payer: Medicare HMO | Source: Ambulatory Visit | Attending: Family Medicine | Admitting: Family Medicine

## 2021-03-13 DIAGNOSIS — R928 Other abnormal and inconclusive findings on diagnostic imaging of breast: Secondary | ICD-10-CM

## 2021-03-13 DIAGNOSIS — R922 Inconclusive mammogram: Secondary | ICD-10-CM | POA: Diagnosis not present

## 2021-04-02 ENCOUNTER — Other Ambulatory Visit: Payer: Self-pay

## 2021-04-02 ENCOUNTER — Ambulatory Visit (HOSPITAL_COMMUNITY): Payer: Medicare HMO | Attending: Cardiovascular Disease

## 2021-04-02 DIAGNOSIS — I480 Paroxysmal atrial fibrillation: Secondary | ICD-10-CM | POA: Insufficient documentation

## 2021-04-02 DIAGNOSIS — R079 Chest pain, unspecified: Secondary | ICD-10-CM

## 2021-04-02 DIAGNOSIS — D6869 Other thrombophilia: Secondary | ICD-10-CM | POA: Insufficient documentation

## 2021-04-02 DIAGNOSIS — E785 Hyperlipidemia, unspecified: Secondary | ICD-10-CM | POA: Diagnosis not present

## 2021-04-02 DIAGNOSIS — I10 Essential (primary) hypertension: Secondary | ICD-10-CM | POA: Insufficient documentation

## 2021-04-02 LAB — ECHOCARDIOGRAM COMPLETE
Area-P 1/2: 3.42 cm2
S' Lateral: 2.9 cm

## 2021-04-03 DIAGNOSIS — I4891 Unspecified atrial fibrillation: Secondary | ICD-10-CM | POA: Diagnosis not present

## 2021-04-03 DIAGNOSIS — K219 Gastro-esophageal reflux disease without esophagitis: Secondary | ICD-10-CM | POA: Diagnosis not present

## 2021-04-03 DIAGNOSIS — C3491 Malignant neoplasm of unspecified part of right bronchus or lung: Secondary | ICD-10-CM | POA: Diagnosis not present

## 2021-04-03 DIAGNOSIS — J45909 Unspecified asthma, uncomplicated: Secondary | ICD-10-CM | POA: Diagnosis not present

## 2021-04-03 DIAGNOSIS — E782 Mixed hyperlipidemia: Secondary | ICD-10-CM | POA: Diagnosis not present

## 2021-04-03 DIAGNOSIS — I10 Essential (primary) hypertension: Secondary | ICD-10-CM | POA: Diagnosis not present

## 2021-04-03 DIAGNOSIS — F324 Major depressive disorder, single episode, in partial remission: Secondary | ICD-10-CM | POA: Diagnosis not present

## 2021-04-03 DIAGNOSIS — I480 Paroxysmal atrial fibrillation: Secondary | ICD-10-CM | POA: Diagnosis not present

## 2021-04-03 DIAGNOSIS — D508 Other iron deficiency anemias: Secondary | ICD-10-CM | POA: Diagnosis not present

## 2021-04-08 DIAGNOSIS — Z79899 Other long term (current) drug therapy: Secondary | ICD-10-CM | POA: Diagnosis not present

## 2021-04-11 DIAGNOSIS — E782 Mixed hyperlipidemia: Secondary | ICD-10-CM | POA: Diagnosis not present

## 2021-04-11 DIAGNOSIS — I480 Paroxysmal atrial fibrillation: Secondary | ICD-10-CM | POA: Diagnosis not present

## 2021-04-11 DIAGNOSIS — G2581 Restless legs syndrome: Secondary | ICD-10-CM | POA: Diagnosis not present

## 2021-04-11 DIAGNOSIS — C3491 Malignant neoplasm of unspecified part of right bronchus or lung: Secondary | ICD-10-CM | POA: Diagnosis not present

## 2021-04-11 DIAGNOSIS — E669 Obesity, unspecified: Secondary | ICD-10-CM | POA: Diagnosis not present

## 2021-04-11 DIAGNOSIS — F419 Anxiety disorder, unspecified: Secondary | ICD-10-CM | POA: Diagnosis not present

## 2021-04-11 DIAGNOSIS — K219 Gastro-esophageal reflux disease without esophagitis: Secondary | ICD-10-CM | POA: Diagnosis not present

## 2021-04-11 DIAGNOSIS — I10 Essential (primary) hypertension: Secondary | ICD-10-CM | POA: Diagnosis not present

## 2021-04-11 DIAGNOSIS — D6869 Other thrombophilia: Secondary | ICD-10-CM | POA: Diagnosis not present

## 2021-05-22 DIAGNOSIS — C3491 Malignant neoplasm of unspecified part of right bronchus or lung: Secondary | ICD-10-CM | POA: Diagnosis not present

## 2021-05-22 DIAGNOSIS — E782 Mixed hyperlipidemia: Secondary | ICD-10-CM | POA: Diagnosis not present

## 2021-05-22 DIAGNOSIS — J45909 Unspecified asthma, uncomplicated: Secondary | ICD-10-CM | POA: Diagnosis not present

## 2021-05-22 DIAGNOSIS — I4891 Unspecified atrial fibrillation: Secondary | ICD-10-CM | POA: Diagnosis not present

## 2021-05-22 DIAGNOSIS — F324 Major depressive disorder, single episode, in partial remission: Secondary | ICD-10-CM | POA: Diagnosis not present

## 2021-05-22 DIAGNOSIS — K219 Gastro-esophageal reflux disease without esophagitis: Secondary | ICD-10-CM | POA: Diagnosis not present

## 2021-05-22 DIAGNOSIS — I480 Paroxysmal atrial fibrillation: Secondary | ICD-10-CM | POA: Diagnosis not present

## 2021-05-22 DIAGNOSIS — D508 Other iron deficiency anemias: Secondary | ICD-10-CM | POA: Diagnosis not present

## 2021-05-22 DIAGNOSIS — I10 Essential (primary) hypertension: Secondary | ICD-10-CM | POA: Diagnosis not present

## 2021-07-30 DIAGNOSIS — G8929 Other chronic pain: Secondary | ICD-10-CM | POA: Diagnosis not present

## 2021-07-30 DIAGNOSIS — I480 Paroxysmal atrial fibrillation: Secondary | ICD-10-CM | POA: Diagnosis not present

## 2021-07-30 DIAGNOSIS — E782 Mixed hyperlipidemia: Secondary | ICD-10-CM | POA: Diagnosis not present

## 2021-07-30 DIAGNOSIS — K219 Gastro-esophageal reflux disease without esophagitis: Secondary | ICD-10-CM | POA: Diagnosis not present

## 2021-07-30 DIAGNOSIS — J45909 Unspecified asthma, uncomplicated: Secondary | ICD-10-CM | POA: Diagnosis not present

## 2021-07-30 DIAGNOSIS — I10 Essential (primary) hypertension: Secondary | ICD-10-CM | POA: Diagnosis not present

## 2021-07-30 DIAGNOSIS — D508 Other iron deficiency anemias: Secondary | ICD-10-CM | POA: Diagnosis not present

## 2021-07-30 DIAGNOSIS — F324 Major depressive disorder, single episode, in partial remission: Secondary | ICD-10-CM | POA: Diagnosis not present

## 2021-08-28 ENCOUNTER — Ambulatory Visit (HOSPITAL_COMMUNITY)
Admission: RE | Admit: 2021-08-28 | Discharge: 2021-08-28 | Disposition: A | Discharge status: Home / Self Care | Payer: Medicare HMO | Source: Ambulatory Visit | Attending: Physician Assistant | Admitting: Physician Assistant

## 2021-08-28 ENCOUNTER — Other Ambulatory Visit: Payer: Self-pay

## 2021-08-28 VITALS — BP 146/56 | HR 53 | Ht 61.0 in | Wt 183.8 lb

## 2021-08-28 DIAGNOSIS — I480 Paroxysmal atrial fibrillation: Secondary | ICD-10-CM | POA: Insufficient documentation

## 2021-08-28 DIAGNOSIS — I10 Essential (primary) hypertension: Secondary | ICD-10-CM | POA: Insufficient documentation

## 2021-08-28 DIAGNOSIS — Z87891 Personal history of nicotine dependence: Secondary | ICD-10-CM | POA: Diagnosis not present

## 2021-08-28 DIAGNOSIS — D6869 Other thrombophilia: Secondary | ICD-10-CM | POA: Diagnosis not present

## 2021-08-28 DIAGNOSIS — E669 Obesity, unspecified: Secondary | ICD-10-CM | POA: Diagnosis not present

## 2021-08-28 DIAGNOSIS — Z7901 Long term (current) use of anticoagulants: Secondary | ICD-10-CM | POA: Diagnosis not present

## 2021-08-28 DIAGNOSIS — Z8249 Family history of ischemic heart disease and other diseases of the circulatory system: Secondary | ICD-10-CM | POA: Insufficient documentation

## 2021-08-28 DIAGNOSIS — Z79899 Other long term (current) drug therapy: Secondary | ICD-10-CM | POA: Insufficient documentation

## 2021-08-28 DIAGNOSIS — Z6834 Body mass index (BMI) 34.0-34.9, adult: Secondary | ICD-10-CM | POA: Diagnosis not present

## 2021-08-28 DIAGNOSIS — Z09 Encounter for follow-up examination after completed treatment for conditions other than malignant neoplasm: Secondary | ICD-10-CM | POA: Insufficient documentation

## 2021-08-28 NOTE — Progress Notes (Signed)
Primary Care Physician: [name], MD Primary Electrophysiologist: Dr Rayann Heman Referring Physician: Dr Rosezella Rumpf is a 80 y.o. female with a history of HLD, HTN, atrial fibrillation who presents for follow up in the [facility].  The patient was initially diagnosed with atrial fibrillation remotely and had an afib ablation in 2013. Patient is on Xarelto for a CHADS2VASC score of 4. She reports that she has done well since her last visit. She denies any bleeding issues on anticoagulation.   Today, she denies symptoms of palpitations, chest pain, shortness of breath, orthopnea, PND, lower extremity edema, dizziness, presyncope, syncope, snoring, daytime somnolence, bleeding, or neurologic sequela. The patient is tolerating medications without difficulties and is otherwise without complaint today.    Atrial Fibrillation Risk Factors:  she does have symptoms or diagnosis of sleep apnea. Mild, not requiring CPAP. she does not have a history of rheumatic fever. she does not have a history of alcohol use.   she has a BMI of Body mass index is 34.73 kg/m.Marland Kitchen Filed Weights   08/28/21 1052  Weight: 83.4 kg    Family History  Problem Relation Age of Onset   Colon cancer Maternal Uncle    Colon cancer Maternal Grandmother    Congestive Heart Failure Mother    Lung cancer Father    Diabetes Brother    Hypertension Brother    Breast cancer Neg Hx      Atrial Fibrillation Management history:  Previous antiarrhythmic drugs: flecainide, dofetilide  Previous cardioversions: none Previous ablations: 2013 CHADS2VASC score: 4 Anticoagulation history: warfarin, Xarelto   Past Medical History:  Diagnosis Date   Anxiety    Arthritis    Asthma    dx 10 yrs ago   Atrial fibrillation (HCC)    CHADS VASC score 3. Intolerant to flecainide. s/p afib ablation 02/12/12   Colon polyps    Diastolic dysfunction    Dysrhythmia    a fib   dx 2014    GERD (gastroesophageal reflux disease)    History of kidney infection     last one was approx 2009   Hyperlipidemia    Hypertension    lung ca dx'd 08/2016   Mild sleep apnea    wears NO equipment   Mitral regurgitation    mild   Neuromuscular disorder (HCC)    Obesity    Plantar fasciitis    Restless leg syndrome    Sinus bradycardia    Sleep apnea    Stress bladder incontinence, female    WEARS SMALL PADS   Past Surgical History:  Procedure Laterality Date   ABDOMINAL HYSTERECTOMY  1992   BSO   APPENDECTOMY     atrial fibrillation ablation  02/12/12   PVI by Dr Rayann Heman   ATRIAL FIBRILLATION ABLATION N/A 02/12/2012   Procedure: ATRIAL FIBRILLATION ABLATION;  Surgeon: Thompson Grayer, MD;  Location: [facility] CATH LAB;  Service: Cardiovascular;  Laterality: N/A;   BLADDER SUSPENSION     CARPAL TUNNEL RELEASE Right 01/26/2020   Procedure: CARPAL TUNNEL RELEASE;  Surgeon: Daryll Brod, MD;  Location: [facility];  Service: Orthopedics;  Laterality: Right;   [place], 200,2004, 2007, 05/06/2011   adenomas   heels spurs     x 2   TEE WITHOUT CARDIOVERSION  02/11/2012   Procedure: TRANSESOPHAGEAL ECHOCARDIOGRAM (TEE);  Surgeon: Larey Dresser, MD;  Location: [facility];  Service: Cardiovascular;  Laterality: N/A;   TONSILLECTOMY AND  ADENOIDECTOMY     VARICOSE VEIN SURGERY     VIDEO ASSISTED THORACOSCOPY (VATS)/WEDGE RESECTION Right 10/02/2016   Procedure: VIDEO ASSISTED THORACOSCOPY (VATS)/WEDGE RESECTION;  Surgeon: [name], MD;  Location: [facility];  Service: Thoracic;  Laterality: Right;    Current Outpatient Medications  Medication Sig Dispense Refill   acetaminophen (TYLENOL) 500 MG tablet Take 500 mg by mouth every 6 (six) hours as needed.     calcium carbonate (OS-CAL) 600 MG TABS Take 600 mg by mouth daily.     Cholecalciferol (VITAMIN D PO) Take 4,000 Units by mouth daily.       clonazePAM (KLONOPIN) 0.5 MG tablet Take 0.25 mg by mouth  2 (two) times daily as needed for anxiety.      famotidine (PEPCID) 20 MG tablet Take 20 mg by mouth 2 (two) times daily.     lisinopril-hydrochlorothiazide (ZESTORETIC) 20-25 MG tablet Take 1 tablet by mouth daily.     lovastatin (MEVACOR) 40 MG tablet Take 80 mg by mouth daily.      MAGNESIUM PO Take 250 mg by mouth daily.     nitroGLYCERIN (NITROSTAT) 0.4 MG SL tablet Place 1 tablet (0.4 mg total) under the tongue every 5 (five) minutes as needed for chest pain. 25 tablet 1   potassium chloride SA (K-DUR,KLOR-CON) 20 MEQ tablet Take 20 mEq by mouth daily.     senna (SENOKOT) 8.6 MG tablet Take 1 tablet by mouth daily.     traMADol (ULTRAM) 50 MG tablet Take 1 tablet (50 mg total) by mouth every 6 (six) hours as needed. 20 tablet 0   vitamin E 180 MG (400 UNITS) capsule Take 400 Units by mouth daily.      XARELTO 20 MG TABS tablet Take 20 mg by mouth daily with supper.      No current facility-administered medications for this encounter.    Allergies  Allergen Reactions   Codeine Other (See Comments)    Skin crawls, jittery, agitated   Prednisone Other (See Comments)    Tachycardia, light headed, rash    Social History   Socioeconomic History   Marital status: Married    Spouse name: Not on file   Number of children: 2   Years of education: Not on file   Highest education level: Not on file  Occupational History   Occupation: retired  Tobacco Use   Smoking status: Former    Types: Cigarettes    Quit date: 09/17/1985    Years since quitting: 35.9   Smokeless tobacco: Never   Tobacco comments:    5 CIG /DAY  Vaping Use   Vaping Use: Never used  Substance and Sexual Activity   Alcohol use: No   Drug use: No   Sexual activity: Yes    Birth control/protection: Post-menopausal  Other Topics Concern   Not on file  Social History Narrative   Pt lives in [place] with spouse.  2 grown children.   Retired Multimedia programmer.   Social Determinants of Health   Financial Resource  Strain: Not on file  Food Insecurity: Not on file  Transportation Needs: Not on file  Physical Activity: Not on file  Stress: Not on file  Social Connections: Not on file  Intimate Partner Violence: Not on file     ROS- All systems are reviewed and negative except as per the HPI above.  Physical Exam: Vitals:   08/28/21 1052  BP: (!) 146/56  Pulse: (!) 53  Weight: 83.4 kg  Height:  5\' 1"  (1.549 m)    GEN- The patient is a well appearing obese elderly female, alert and oriented x 3 today.   Head- normocephalic, atraumatic Eyes-  Sclera clear, conjunctiva pink Ears- hearing intact Oropharynx- clear Neck- supple  Lungs- Clear to ausculation bilaterally, normal work of breathing Heart- Regular rate and rhythm, bradycardia, no murmurs, rubs or gallops  GI- soft, NT, ND, + BS Extremities- no clubbing, cyanosis, or edema MS- no significant deformity or atrophy Skin- no rash or lesion Psych- euthymic mood, full affect Neuro- strength and sensation are intact  Wt Readings from Last 3 Encounters:  08/28/21 83.4 kg  03/12/21 83.9 kg  02/27/21 83.9 kg    EKG today demonstrates  SB, PVC Vent. rate 54 BPM PR interval 148 ms QRS duration 84 ms QT/QTcB 410/388 ms  Echo 04/02/21 demonstrated   1. Left ventricular ejection fraction, by estimation, is 60 to 65%. The  left ventricle has normal function. The left ventricle has no regional  wall motion abnormalities. Left ventricular diastolic parameters were  normal.   2. Right ventricular systolic function is normal. The right ventricular  size is normal. Tricuspid regurgitation signal is inadequate for assessing PA pressure.   3. The mitral valve is degenerative. Trivial mitral valve regurgitation.  No evidence of mitral stenosis.   4. The aortic valve is tricuspid. There is mild calcification of the  aortic valve. Aortic valve regurgitation is not visualized. Mild aortic  valve sclerosis is present, with no evidence of aortic  valve stenosis.   5. The inferior vena cava is normal in size with greater than 50%  respiratory variability, suggesting right atrial pressure of 3 mmHg.   Comparison(s): No significant change from prior study.   Epic records are reviewed at length today  CHA2DS2-VASc Score = 4  The patient's score is based upon: CHF History: 0 HTN History: 1 Diabetes History: 0 Stroke History: 0 Vascular Disease History: 0 Age Score: 2 Gender Score: 1      ASSESSMENT AND PLAN: 1. Paroxysmal Atrial Fibrillation (ICD10:  I48.0) The patient's CHA2DS2-VASc score is 4, indicating a 4.8% annual risk of stroke.   S/p afib ablation in 2013 Patient appears to be maintaining SR. Continue Xarelto 20 mg daily  2. Secondary Hypercoagulable State (ICD10:  D68.69) The patient is at significant risk for stroke/thromboembolism based upon her CHA2DS2-VASc Score of 4.  Continue Rivaroxaban (Xarelto).   3. Obesity Body mass index is 34.73 kg/m. Lifestyle modification was discussed at length including regular exercise and weight reduction.  4. HTN Stable, no changes today.   Follow up in the AF clinic in 6 months.    [facility] [address] [place], [place] [address] [phone] 08/28/2021 11:24 AM

## 2021-08-30 DIAGNOSIS — J45909 Unspecified asthma, uncomplicated: Secondary | ICD-10-CM | POA: Diagnosis not present

## 2021-08-30 DIAGNOSIS — F324 Major depressive disorder, single episode, in partial remission: Secondary | ICD-10-CM | POA: Diagnosis not present

## 2021-08-30 DIAGNOSIS — I480 Paroxysmal atrial fibrillation: Secondary | ICD-10-CM | POA: Diagnosis not present

## 2021-08-30 DIAGNOSIS — I10 Essential (primary) hypertension: Secondary | ICD-10-CM | POA: Diagnosis not present

## 2021-08-30 DIAGNOSIS — E782 Mixed hyperlipidemia: Secondary | ICD-10-CM | POA: Diagnosis not present

## 2021-08-30 DIAGNOSIS — K219 Gastro-esophageal reflux disease without esophagitis: Secondary | ICD-10-CM | POA: Diagnosis not present

## 2021-08-30 DIAGNOSIS — G8929 Other chronic pain: Secondary | ICD-10-CM | POA: Diagnosis not present

## 2021-08-30 DIAGNOSIS — D508 Other iron deficiency anemias: Secondary | ICD-10-CM | POA: Diagnosis not present

## 2021-09-28 DIAGNOSIS — F324 Major depressive disorder, single episode, in partial remission: Secondary | ICD-10-CM | POA: Diagnosis not present

## 2021-09-28 DIAGNOSIS — I480 Paroxysmal atrial fibrillation: Secondary | ICD-10-CM | POA: Diagnosis not present

## 2021-09-28 DIAGNOSIS — J45909 Unspecified asthma, uncomplicated: Secondary | ICD-10-CM | POA: Diagnosis not present

## 2021-09-28 DIAGNOSIS — I10 Essential (primary) hypertension: Secondary | ICD-10-CM | POA: Diagnosis not present

## 2021-09-28 DIAGNOSIS — K219 Gastro-esophageal reflux disease without esophagitis: Secondary | ICD-10-CM | POA: Diagnosis not present

## 2021-09-28 DIAGNOSIS — G8929 Other chronic pain: Secondary | ICD-10-CM | POA: Diagnosis not present

## 2021-09-28 DIAGNOSIS — D508 Other iron deficiency anemias: Secondary | ICD-10-CM | POA: Diagnosis not present

## 2021-09-28 DIAGNOSIS — E782 Mixed hyperlipidemia: Secondary | ICD-10-CM | POA: Diagnosis not present

## 2021-10-14 ENCOUNTER — Ambulatory Visit (HOSPITAL_COMMUNITY)
Admission: RE | Admit: 2021-10-14 | Discharge: 2021-10-14 | Disposition: A | Discharge status: Home / Self Care | Payer: Medicare HMO | Source: Ambulatory Visit | Attending: Internal Medicine | Admitting: Internal Medicine

## 2021-10-14 ENCOUNTER — Encounter (HOSPITAL_COMMUNITY): Payer: Self-pay

## 2021-10-14 ENCOUNTER — Inpatient Hospital Stay: Payer: Medicare HMO | Attending: Internal Medicine

## 2021-10-14 ENCOUNTER — Other Ambulatory Visit: Payer: Self-pay

## 2021-10-14 DIAGNOSIS — C3490 Malignant neoplasm of unspecified part of unspecified bronchus or lung: Secondary | ICD-10-CM

## 2021-10-14 DIAGNOSIS — Z90710 Acquired absence of both cervix and uterus: Secondary | ICD-10-CM | POA: Insufficient documentation

## 2021-10-14 DIAGNOSIS — I700 Atherosclerosis of aorta: Secondary | ICD-10-CM | POA: Diagnosis not present

## 2021-10-14 DIAGNOSIS — I10 Essential (primary) hypertension: Secondary | ICD-10-CM | POA: Insufficient documentation

## 2021-10-14 DIAGNOSIS — C3411 Malignant neoplasm of upper lobe, right bronchus or lung: Secondary | ICD-10-CM | POA: Insufficient documentation

## 2021-10-14 DIAGNOSIS — R911 Solitary pulmonary nodule: Secondary | ICD-10-CM | POA: Diagnosis not present

## 2021-10-14 LAB — CMP (CANCER CENTER ONLY)
ALT: 20 U/L (ref 0–44)
AST: 21 U/L (ref 15–41)
Albumin: 3.9 g/dL (ref 3.5–5.0)
Alkaline Phosphatase: 95 U/L (ref 38–126)
Anion gap: 8 (ref 5–15)
BUN: 16 mg/dL (ref 8–23)
CO2: 27 mmol/L (ref 22–32)
Calcium: 9 mg/dL (ref 8.9–10.3)
Chloride: 107 mmol/L (ref 98–111)
Creatinine: 0.81 mg/dL (ref 0.44–1.00)
GFR, Estimated: >60 mL/min (ref >60)
Glucose, Bld: 97 mg/dL (ref 70–99)
Potassium: 4.4 mmol/L (ref 3.5–5.1)
Sodium: 142 mmol/L (ref 135–145)
Total Bilirubin: 0.2 mg/dL — ABNORMAL LOW (ref 0.3–1.2)
Total Protein: 6.7 g/dL (ref 6.5–8.1)

## 2021-10-14 LAB — CBC WITH DIFFERENTIAL (CANCER CENTER ONLY)
Abs Immature Granulocytes: 0.02 10*3/uL (ref 0.00–0.07)
Basophils Absolute: 0 10*3/uL (ref 0.0–0.1)
Basophils Relative: 0 %
Eosinophils Absolute: 0.1 10*3/uL (ref 0.0–0.5)
Eosinophils Relative: 2 %
HCT: 35.9 % — ABNORMAL LOW (ref 36.0–46.0)
Hemoglobin: 11.4 g/dL — ABNORMAL LOW (ref 12.0–15.0)
Immature Granulocytes: 0 %
Lymphocytes Relative: 27 %
Lymphs Abs: 1.7 10*3/uL (ref 0.7–4.0)
MCH: 28.8 pg (ref 26.0–34.0)
MCHC: 31.8 g/dL (ref 30.0–36.0)
MCV: 90.7 fL (ref 80.0–100.0)
Monocytes Absolute: 0.5 10*3/uL (ref 0.1–1.0)
Monocytes Relative: 7 %
Neutro Abs: 4 10*3/uL (ref 1.7–7.7)
Neutrophils Relative %: 64 %
Platelet Count: 243 10*3/uL (ref 150–400)
RBC: 3.96 MIL/uL (ref 3.87–5.11)
RDW: 13.7 % (ref 11.5–15.5)
WBC Count: 6.3 10*3/uL (ref 4.0–10.5)
nRBC: 0 % (ref 0.0–0.2)

## 2021-10-14 MED ORDER — IOHEXOL 350 MG/ML SOLN
60.0000 mL | Freq: Once | INTRAVENOUS | Status: AC | PRN
  Administered 2021-10-14: 60 mL via INTRAVENOUS

## 2021-10-15 ENCOUNTER — Inpatient Hospital Stay: Payer: Medicare HMO | Admitting: Internal Medicine

## 2021-10-15 VITALS — BP 140/59 | HR 60 | Temp 98.0°F | Resp 17 | Ht 61.0 in | Wt 186.5 lb

## 2021-10-15 DIAGNOSIS — C3490 Malignant neoplasm of unspecified part of unspecified bronchus or lung: Secondary | ICD-10-CM | POA: Diagnosis not present

## 2021-10-15 DIAGNOSIS — I10 Essential (primary) hypertension: Secondary | ICD-10-CM | POA: Diagnosis not present

## 2021-10-15 DIAGNOSIS — C3411 Malignant neoplasm of upper lobe, right bronchus or lung: Secondary | ICD-10-CM | POA: Diagnosis not present

## 2021-10-15 DIAGNOSIS — Z90710 Acquired absence of both cervix and uterus: Secondary | ICD-10-CM | POA: Diagnosis not present

## 2021-10-15 NOTE — Progress Notes (Signed)
[facility] [phone]   Fax:(336) [phone]  OFFICE PROGRESS NOTE  [name], [place] [address]  DIAGNOSIS: Stage IB (T1c, N0, M0) non-small cell lung cancer, adenocarcinoma but the patient also has multifocal groundglass opacities in the lung bilaterally highly concerning for low-grade adenocarcinoma as well diagnosed in November 2017.  Genomic Alteration Identified? KRAS G12V Additional Findings? Microsatellite status MS-Stable Tumor Mutation Burden TMB-Intermediate; 11 Muts/Mb Additional Disease-relevant Genes with No Reportable Alterations Identified? EGFR ALK BRAF MET RET ERBB2 ROS1   PDL1 expression 5%.  PRIOR THERAPY: Status post wedge resection of the right upper lobe on 10/02/2016 under the care of Dr. Roxan Hockey.  CURRENT THERAPY: Observation.  INTERVAL HISTORY: Julie Gill 80 y.o. female returns to the clinic today for annual follow-up visit.  The patient is feeling fine today with no concerning complaints.  She denied having any current chest pain, shortness of breath, cough or hemoptysis.  She denied having any fever or chills.  She has no nausea, vomiting, diarrhea or constipation.  She denied having any recent weight loss or night sweats.  She is here today for evaluation after repeating CT scan of the chest yesterday for restaging of her disease.  MEDICAL HISTORY: Past Medical History:  Diagnosis Date   Anxiety    Arthritis    Asthma    dx 10 yrs ago   Atrial fibrillation (HCC)    CHADS VASC score 3. Intolerant to flecainide. s/p afib ablation 02/12/12   Colon polyps    Diastolic dysfunction    Dysrhythmia    a fib   dx 2014   GERD (gastroesophageal reflux disease)    History of kidney infection     last one was approx 2009   Hyperlipidemia    Hypertension    lung ca dx'd 08/2016   Mild sleep apnea    wears NO equipment   Mitral regurgitation    mild   Neuromuscular disorder  (HCC)    Obesity    Plantar fasciitis    Restless leg syndrome    Sinus bradycardia    Sleep apnea    Stress bladder incontinence, female    WEARS SMALL PADS    ALLERGIES:  is allergic to codeine and prednisone.  MEDICATIONS:  Current Outpatient Medications  Medication Sig Dispense Refill   acetaminophen (TYLENOL) 500 MG tablet Take 500 mg by mouth every 6 (six) hours as needed.     calcium carbonate (OS-CAL) 600 MG TABS Take 600 mg by mouth daily.     Cholecalciferol (VITAMIN D PO) Take 4,000 Units by mouth daily.       clonazePAM (KLONOPIN) 0.5 MG tablet Take 0.25 mg by mouth 2 (two) times daily as needed for anxiety.      famotidine (PEPCID) 20 MG tablet Take 20 mg by mouth 2 (two) times daily.     lisinopril-hydrochlorothiazide (ZESTORETIC) 20-25 MG tablet Take 1 tablet by mouth daily.     lovastatin (MEVACOR) 40 MG tablet Take 80 mg by mouth daily.      MAGNESIUM PO Take 250 mg by mouth daily.     nitroGLYCERIN (NITROSTAT) 0.4 MG SL tablet Place 1 tablet (0.4 mg total) under the tongue every 5 (five) minutes as needed for chest pain. 25 tablet 1   potassium chloride SA (K-DUR,KLOR-CON) 20 MEQ tablet Take 20 mEq by mouth daily.     senna (SENOKOT) 8.6 MG tablet Take 1 tablet by mouth daily.  traMADol (ULTRAM) 50 MG tablet Take 1 tablet (50 mg total) by mouth every 6 (six) hours as needed. 20 tablet 0   vitamin E 180 MG (400 UNITS) capsule Take 400 Units by mouth daily.      XARELTO 20 MG TABS tablet Take 20 mg by mouth daily with supper.      No current facility-administered medications for this visit.    SURGICAL HISTORY:  Past Surgical History:  Procedure Laterality Date   ABDOMINAL HYSTERECTOMY  1992   BSO   APPENDECTOMY     atrial fibrillation ablation  02/12/12   PVI by Dr Rayann Heman   ATRIAL FIBRILLATION ABLATION N/A 02/12/2012   Procedure: ATRIAL FIBRILLATION ABLATION;  Surgeon: Thompson Grayer, MD;  Location: [facility] CATH LAB;  Service: Cardiovascular;  Laterality: N/A;    BLADDER SUSPENSION     CARPAL TUNNEL RELEASE Right 01/26/2020   Procedure: CARPAL TUNNEL RELEASE;  Surgeon: Daryll Brod, MD;  Location: [place];  Service: Orthopedics;  Laterality: Right;   [place], 200,2004, 2007, 05/06/2011   adenomas   heels spurs     x 2   TEE WITHOUT CARDIOVERSION  02/11/2012   Procedure: TRANSESOPHAGEAL ECHOCARDIOGRAM (TEE);  Surgeon: Larey Dresser, MD;  Location: [place];  Service: Cardiovascular;  Laterality: N/A;   TONSILLECTOMY AND ADENOIDECTOMY     VARICOSE VEIN SURGERY     VIDEO ASSISTED THORACOSCOPY (VATS)/WEDGE RESECTION Right 10/02/2016   Procedure: VIDEO ASSISTED THORACOSCOPY (VATS)/WEDGE RESECTION;  Surgeon: [name], MD;  Location: [facility];  Service: Thoracic;  Laterality: Right;    REVIEW OF SYSTEMS:  A comprehensive review of systems was negative.   PHYSICAL EXAMINATION: General appearance: alert, cooperative, and no distress Head: Normocephalic, without obvious abnormality, atraumatic Neck: no adenopathy, no JVD, supple, symmetrical, trachea midline, and thyroid not enlarged, symmetric, no tenderness/mass/nodules Lymph nodes: Cervical, supraclavicular, and axillary nodes normal. Resp: clear to auscultation bilaterally Back: symmetric, no curvature. ROM normal. No CVA tenderness. Cardio: regular rate and rhythm, S1, S2 normal, no murmur, click, rub or gallop GI: soft, non-tender; bowel sounds normal; no masses,  no organomegaly Extremities: extremities normal, atraumatic, no cyanosis or edema  ECOG PERFORMANCE STATUS: 1 - Symptomatic but completely ambulatory  Blood pressure (!) 140/59, pulse 60, temperature 98 F (36.7 C), temperature source Temporal, resp. rate 17, height 5' 1"  (1.549 m), weight 186 lb 8 oz (84.6 kg), SpO2 100 %.  LABORATORY DATA: Lab Results  Component Value Date   WBC 6.3 10/14/2021   HGB 11.4 (L) 10/14/2021   HCT 35.9 (L) 10/14/2021   MCV 90.7 10/14/2021   PLT 243  10/14/2021      Chemistry      Component Value Date/Time   NA 142 10/14/2021 1127   NA 142 09/28/2017 0959   K 4.4 10/14/2021 1127   K 4.4 09/28/2017 0959   CL 107 10/14/2021 1127   CO2 27 10/14/2021 1127   CO2 28 09/28/2017 0959   BUN 16 10/14/2021 1127   BUN 21.3 09/28/2017 0959   CREATININE 0.81 10/14/2021 1127   CREATININE 0.9 09/28/2017 0959      Component Value Date/Time   CALCIUM 9.0 10/14/2021 1127   CALCIUM 9.9 09/28/2017 0959   ALKPHOS 95 10/14/2021 1127   ALKPHOS 87 09/28/2017 0959   AST 21 10/14/2021 1127   AST 24 09/28/2017 0959   ALT 20 10/14/2021 1127   ALT 24 09/28/2017 0959   BILITOT 0.2 (L) 10/14/2021 1127   BILITOT  0.33 09/28/2017 0959       RADIOGRAPHIC STUDIES: CT Chest W Contrast  Result Date: 10/15/2021 CLINICAL DATA:  Restaging non-small cell lung cancer. EXAM: CT CHEST WITH CONTRAST TECHNIQUE: Multidetector CT imaging of the chest was performed during intravenous contrast administration. CONTRAST:  42m OMNIPAQUE IOHEXOL 350 MG/ML SOLN COMPARISON:  10/15/2020 FINDINGS: Cardiovascular: Heart size is normal. No pericardial effusion. Aortic atherosclerosis and coronary artery calcifications. Mediastinum/Nodes: Normal appearance of the thyroid gland. The trachea appears patent and is midline. Normal appearance of the esophagus. No enlarged supraclavicular, axillary, mediastinal, or hilar lymph nodes. Lungs/Pleura: No pleural effusion, airspace consolidation, or pneumothorax. Postsurgical changes from right upper lobe wedge resection are again noted. Stable appearance of soft tissue thickening along the suture chain, image 33/7. Again noted are peribronchovascular nodularity, mild bronchiolectasis and volume loss within the right middle lobe and lingula. Favor postinflammatory change. Stable subpleural, solid nodule within the posteromedial left lower lobe measuring 4 mm, image 110/7. Solid nodule in the superior segment of the left lower lobe is also  unchanged measuring 6 mm, image 75/7. Scattered ground-glass attenuating nodules are again identified and appear unchanged from previous exam. The largest ground-glass nodule within the posterior right lower lobe measures 1.2 cm, image 99/7. Upper Abdomen: Lateral segment left hepatic lobe cyst is unchanged measuring 1.1 cm. Small hiatal hernia. Aortic atherosclerosis. Musculoskeletal: Degenerative disc disease is noted within the thoracic spine. No acute or suspicious osseous findings. IMPRESSION: 1. Stable appearance of the chest status post right upper lobe wedge resection. No specific findings identified to suggest residual or recurrence of tumor. 2. Unchanged appearance of peribronchovascular nodularity, mild bronchiolectasis and volume loss within the right middle lobe and lingula. Favor postinflammatory change. 3. Unchanged appearance of scattered ground-glass attenuating measuring up to 1.2 cm. Adenocarcinoma cannot be excluded. Continued attention on follow-up imaging is advised. 4. Small hiatal hernia. 5. Aortic Atherosclerosis (ICD10-I70.0). Electronically Signed   By: TKerby MoorsM.D.   On: 10/15/2021 09:37     ASSESSMENT AND PLAN:  This is a very pleasant 80years old white female with stage IB non-small cell lung cancer, adenocarcinoma with no actionable mutations and several other groundglass opacities bilaterally.  She is status post wedge resection of the right upper lobe nodule. The patient has been in observation since 2017 with no concerning complaints. She had repeat CT scan of the chest performed yesterday.  The final report of the scan is still pending. I personally and independently reviewed the scan images in comparison to the previous scan and I did not see any significant change but I will wait for the final report for confirmation. The patient was advised to call immediately if she has any other concerning symptoms in the interval. The patient voices understanding of current  disease status and treatment options and is in agreement with the current care plan.  All questions were answered. The patient knows to call the clinic with any problems, questions or concerns. We can certainly see the patient much sooner if necessary.  Disclaimer: This note was dictated with voice recognition software. Similar sounding words can inadvertently be transcribed and may not be corrected upon review.

## 2021-10-16 DIAGNOSIS — R7301 Impaired fasting glucose: Secondary | ICD-10-CM | POA: Diagnosis not present

## 2021-10-16 DIAGNOSIS — E782 Mixed hyperlipidemia: Secondary | ICD-10-CM | POA: Diagnosis not present

## 2021-10-16 DIAGNOSIS — Z79899 Other long term (current) drug therapy: Secondary | ICD-10-CM | POA: Diagnosis not present

## 2021-10-16 DIAGNOSIS — R7309 Other abnormal glucose: Secondary | ICD-10-CM | POA: Diagnosis not present

## 2021-10-16 DIAGNOSIS — I10 Essential (primary) hypertension: Secondary | ICD-10-CM | POA: Diagnosis not present

## 2021-10-17 ENCOUNTER — Encounter: Payer: Self-pay | Admitting: Physician Assistant

## 2021-10-17 ENCOUNTER — Ambulatory Visit: Payer: Medicare HMO | Admitting: Physician Assistant

## 2021-10-17 VITALS — BP 120/80 | HR 74 | Ht 61.5 in | Wt 187.0 lb

## 2021-10-17 DIAGNOSIS — Z86010 Personal history of colonic polyps: Secondary | ICD-10-CM | POA: Diagnosis not present

## 2021-10-17 DIAGNOSIS — R194 Change in bowel habit: Secondary | ICD-10-CM

## 2021-10-17 DIAGNOSIS — Z1211 Encounter for screening for malignant neoplasm of colon: Secondary | ICD-10-CM | POA: Diagnosis not present

## 2021-10-17 DIAGNOSIS — Z860101 Personal history of adenomatous and serrated colon polyps: Secondary | ICD-10-CM

## 2021-10-17 MED ORDER — SUTAB 1479-225-188 MG PO TABS: 1.0000 | ORAL_TABLET | ORAL | 0 refills | Status: DC

## 2021-10-17 NOTE — Patient Instructions (Addendum)
If you are age 78 or older, your body mass index should be between 23-30. Your Body mass index is 34.76 kg/m. If this is out of the aforementioned range listed, please consider follow up with your Primary Care Provider. ________________________________________________________  The [facility] providers would like to encourage you to use [facility] to communicate with providers for non-urgent requests or questions.  Due to long hold times on the telephone, sending your provider a message by [facility] may be a faster and more efficient way to get a response.  Please allow 48 business hours for a response.  Please remember that this is for non-urgent requests.  _______________________________________________________  Julie Gill have been scheduled for a colonoscopy. Please follow written instructions given to you at your visit today.  Please pick up your prep supplies at the pharmacy within the next 1-3 days. If you use inhalers (even only as needed), please bring them with you on the day of your procedure.  **Please call Cardiology (Dr. Rayann Heman) and make an appointment in regards to your episode of shortness of breath and neck pain.  You will be contacted by our office prior to your procedure for directions on holding your Xarelto.  If you do not hear from our office 1 week prior to your scheduled procedure, please call [phone] to discuss.   **Call the office after you have been seen by Cardiology and speak with Dr. Celesta Aver nurse Remo Lipps).  Follow up pending.  Thank you for entrusting me with your care and choosing [facility].  Amy Esterwood, PA-C

## 2021-10-17 NOTE — Progress Notes (Signed)
Subjective:    Patient ID: Julie Gill, female    DOB: 18-May-1941, 80 y.o.   MRN: [id number]  HPI Julie Gill is a pleasant 80 year old white female, known to Dr. Carlean Purl, who comes into discuss follow-up colonoscopy.  She last had colonoscopy in 2017 with removal of a 4 mm polyp from the descending colon which was a tubular adenoma and was indicated for 5-year interval follow-up, otherwise negative exam.  She also has prior history of adenomatous colon polyps.  Also endorses family history of colon cancer in her maternal grandmother diagnosed in her early 37s and a maternal uncle diagnosed in his 90s. Patient has personal history of adenocarcinoma of the lung stage Ib diagnosed 2017 and status postresection.  She had recent CT, and is followed by Dr. Earlie Server. Also with history of hypertension, atrial fibrillation for which she is on Xarelto followed by Dr. Rayann Heman. Echo in May 2022 with EF 60 to 65%/no aortic stenosis and myocardial perfusion study at that same time was low risk.  Patient has no complaints of abdominal discomfort.  She says over the past 6 months her stools have been normal but she has noticed that with defecation and her bowel movements seem to be heading more to the right side consistently which is a change.  No melena or hematochezia.  She does mention that she had an episode last evening of what she thinks was atrial fibrillation with palpitations associated with some mild shortness of breath and then developed a tightness and discomfort bilaterally up into her neck.  She says it scared her about resolved within a few minutes, she this was the first time she had had this type of an episode.   Review of Systems.  Pertinent positive and negative review of systems were noted in the above HPI section.  All other review of systems was otherwise negative.   Outpatient Encounter Medications as of 10/17/2021  Medication Sig   acetaminophen (TYLENOL) 500 MG tablet Take 500 mg by mouth  every 6 (six) hours as needed.   calcium carbonate (OS-CAL) 600 MG TABS Take 600 mg by mouth daily.   Cholecalciferol (VITAMIN D PO) Take 4,000 Units by mouth daily.     clonazePAM (KLONOPIN) 0.5 MG tablet Take 0.25 mg by mouth 2 (two) times daily as needed for anxiety.    famotidine (PEPCID) 20 MG tablet Take 20 mg by mouth 2 (two) times daily.   lisinopril-hydrochlorothiazide (ZESTORETIC) 20-25 MG tablet Take 1 tablet by mouth daily.   lovastatin (MEVACOR) 40 MG tablet Take 80 mg by mouth daily.    MAGNESIUM PO Take 250 mg by mouth daily.   nitroGLYCERIN (NITROSTAT) 0.4 MG SL tablet Place 1 tablet (0.4 mg total) under the tongue every 5 (five) minutes as needed for chest pain.   potassium chloride SA (K-DUR,KLOR-CON) 20 MEQ tablet Take 20 mEq by mouth daily.   senna (SENOKOT) 8.6 MG tablet Take 1 tablet by mouth daily.   Sodium Sulfate-Mag Sulfate-KCl (SUTAB) [id number] MG TABS Take 1 kit by mouth as directed.   traMADol (ULTRAM) 50 MG tablet Take 1 tablet (50 mg total) by mouth every 6 (six) hours as needed.   vitamin E 180 MG (400 UNITS) capsule Take 400 Units by mouth daily.    XARELTO 20 MG TABS tablet Take 20 mg by mouth daily with supper.    No facility-administered encounter medications on file as of 10/17/2021.   Allergies  Allergen Reactions   Codeine Other (See Comments)  Skin crawls, jittery, agitated   Prednisone Other (See Comments)    Tachycardia, light headed, rash   Patient Active Problem List   Diagnosis Date Noted   Secondary hypercoagulable state ([place]) 08/28/2021   Adenocarcinoma of right lung, stage 1 ([place]) 10/27/2016   Adenocarcinoma of lung ([place]) 10/02/2016   Edema 04/02/2016   Headache(784.0) 01/13/2012   Essential hypertension 01/06/2012   Long term current use of anticoagulant 11/19/2011   Dyspnea on exertion 11/13/2011   Sinus bradycardia    Paroxysmal atrial fibrillation (HCC)    History of colonic polyps 05/06/2011   Social History    Socioeconomic History   Marital status: Married    Spouse name: Not on file   Number of children: 2   Years of education: Not on file   Highest education level: Not on file  Occupational History   Occupation: retired  Tobacco Use   Smoking status: Former    Types: Cigarettes    Quit date: 09/17/1985    Years since quitting: 36.1   Smokeless tobacco: Never   Tobacco comments:    5 CIG /DAY  Vaping Use   Vaping Use: Never used  Substance and Sexual Activity   Alcohol use: No   Drug use: No   Sexual activity: Yes    Birth control/protection: Post-menopausal  Other Topics Concern   Not on file  Social History Narrative   Pt lives in [place] with spouse.  2 grown children.   Retired Multimedia programmer.   Social Determinants of Health   Financial Resource Strain: Not on file  Food Insecurity: Not on file  Transportation Needs: Not on file  Physical Activity: Not on file  Stress: Not on file  Social Connections: Not on file  Intimate Partner Violence: Not on file    Julie Gill family history includes Colon cancer in her maternal grandmother and maternal uncle; Congestive Heart Failure in her mother; Diabetes in her brother; Hypertension in her brother; Lung cancer in her father.      Objective:    Vitals:   10/17/21 1431  BP: 120/80  Pulse: 74    Physical Exam Well-developed well-nourished elderly white female in no acute distress.  Pleasant height, Weight, 187 BMI 34.7  HEENT; nontraumatic normocephalic, EOMI, PE R LA, sclera anicteric. Oropharynx; not examined today Neck; supple, no JVD Cardiovascular; irregular rate and rhythm with S1-S2, no murmur rub or gallop Pulmonary; Clear bilaterally Abdomen; soft, nontender, nondistended, no palpable mass or hepatosplenomegaly, bowel sounds are active Rectal; not done today Skin; benign exam, no jaundice rash or appreciable lesions Extremities; no clubbing cyanosis or edema skin warm and dry Neuro/Psych; alert and  oriented x4, grossly nonfocal mood and affect appropriate        Assessment & Plan:   #65 80 year old white female with history of adenomatous colon polyps, family history of colon cancer in a maternal grandmother and maternal uncle. Due for follow-up colonoscopy-last colon 2017 with 1 adenomatous polyp  #2-change in bowels/noticing stool more from the right side of the rectum consistently over the past 6 months  #3 chronic anticoagulation-on Xarelto #4 history of atrial fibrillation #5 personal history of stage Ib adenocarcinoma of the lung 2017 status postresection  #6 episode of palpitations, mild shortness of breath and bilateral neck tightness last evening-has never had a similar episode Question related to atrial fibrillation, ischemia  Plan; Patient is advised to call her cardiologist/Dr. Rayann Heman regarding the episode described above, and is advised to have cardiology follow-up prior to decision  regarding colonoscopy.  We discussed general recommendation to stop screening/surveillance colonoscopies somewhere between the ages of 13 and 70.  Patient would like to have another colonoscopy.  Will go ahead and schedule for colonoscopy with Dr. Carlean Purl, and intentionally scheduled towards the end of January to allow her to have cardiac evaluation in the interim.  Procedure was discussed in detail with the patient including indications risks and benefits and she is agreeable to proceed. Xarelto will need to be held for 24 hours prior to procedure.  We will communicate with her cardiologist/Dr. Allred to assure this is feasible for this patient.  I have asked her to call back and speak with Dr. Celesta Aver nurse after she has cardiac evaluation and inform us if she has been cleared or further evaluation needed prior to proceeding with colonoscopy.  Julie Gill Genia Harold PA-C 10/17/2021   Cc: [name], MD

## 2021-10-21 DIAGNOSIS — F324 Major depressive disorder, single episode, in partial remission: Secondary | ICD-10-CM | POA: Diagnosis not present

## 2021-10-21 DIAGNOSIS — G4730 Sleep apnea, unspecified: Secondary | ICD-10-CM | POA: Diagnosis not present

## 2021-10-21 DIAGNOSIS — F419 Anxiety disorder, unspecified: Secondary | ICD-10-CM | POA: Diagnosis not present

## 2021-10-21 DIAGNOSIS — G2581 Restless legs syndrome: Secondary | ICD-10-CM | POA: Diagnosis not present

## 2021-10-21 DIAGNOSIS — I10 Essential (primary) hypertension: Secondary | ICD-10-CM | POA: Diagnosis not present

## 2021-10-21 DIAGNOSIS — E782 Mixed hyperlipidemia: Secondary | ICD-10-CM | POA: Diagnosis not present

## 2021-10-21 DIAGNOSIS — I480 Paroxysmal atrial fibrillation: Secondary | ICD-10-CM | POA: Diagnosis not present

## 2021-10-21 DIAGNOSIS — Z79899 Other long term (current) drug therapy: Secondary | ICD-10-CM | POA: Diagnosis not present

## 2021-10-21 DIAGNOSIS — K219 Gastro-esophageal reflux disease without esophagitis: Secondary | ICD-10-CM | POA: Diagnosis not present

## 2021-10-23 ENCOUNTER — Telehealth: Payer: Self-pay

## 2021-10-23 NOTE — Telephone Encounter (Signed)
-----   Message from Alfredia Ferguson, PA-C sent at 10/21/2021 10:27 AM EST ----- Regarding: cancel colon Beth - see Dr Marla Roe note - let pt know that Dr Carlean Purl is not comfortable with Colonoscopy being scheduled until Cardiac eval complete- please cancel the Colon which ws scheduled out intentionally in January - please ask her to  call and speak  to you after she completes cardiac eval , and I cleared- thanks ----- Message ----- From: Gatha Mayer, MD Sent: 10/20/2021   1:57 PM EST To: Alfredia Ferguson, PA-C  We need to cancel scheduled colonoscopy until cardiology evaluation is completed.  Totally elective colonoscopy of ? Benefit though am ok with doing one we canot have her on schedule until cardiology evaluation complete.  I just cannot take a chance that it will all work out.  In the past I might have rolled with it but given our issues in the past year am more conservative.  thanks ----- Message ----- From: Leotis Pain Sent: 10/17/2021   5:25 PM EST To: Gatha Mayer, MD

## 2021-10-23 NOTE — Telephone Encounter (Signed)
Appointment for her colonoscopy has been canceled as instructed. Called to explain to the patient. No answer. Left her information on her voicemail.

## 2021-10-30 ENCOUNTER — Telehealth: Payer: Self-pay

## 2021-10-30 NOTE — Telephone Encounter (Signed)
[facility] Pre-operative Risk Assessment     Request for surgical clearance:     Endoscopy Procedure  What type of surgery is being performed?     colonoscopy  When is this surgery scheduled?     Pending cardiac clearance  What type of clearance is required ?   Medical  Are there any medications that need to be held prior to surgery and how long? no  Practice name and name of physician performing surgery?      [facility] Dr Silvano Rusk  What is your office phone and fax number?      Phone- [phone]  [phone]  Anesthesia type (None, local, MAC, general) ?       MAC   (Patient is aware she may need to be seen by cardiology)

## 2021-10-30 NOTE — Telephone Encounter (Signed)
Spoke with the patient. She understands the plan. I will send a message to Dr Jackalyn Lombard office asking for cardiac evaluation on this patient.

## 2021-10-30 NOTE — Telephone Encounter (Signed)
Pharmacy, can you please comment on how long Xarelto can be held for upcoming procedure?  Thank you!

## 2021-10-31 NOTE — Telephone Encounter (Signed)
The pt has been advised of the xarelto hold.  She is waiting to be scheduled.

## 2021-10-31 NOTE — Telephone Encounter (Signed)
Patient with diagnosis of atrial fibrillation on Xarelto for anticoagulation.    Procedure: colonoscopy Date of procedure: TBD   CHA2DS2-VASc Score = 4   This indicates a 4.8% annual risk of stroke. The patient's score is based upon: CHF History: 0 HTN History: 1 Diabetes History: 0 Stroke History: 0 Vascular Disease History: 0 Age Score: 2 Gender Score: 1   CrCl 77 Platelet count 243  Per office protocol, patient can hold Xarelto for 1 days prior to procedure.   Patient will not need bridging with Lovenox (enoxaparin) around procedure.   Request indicates they do not need any meds held.  Not sure if they are okay doing procedure on Xarelto or not.  Ok to hold 1 day if needed

## 2021-11-01 NOTE — Telephone Encounter (Signed)
   Primary Cardiologist: Thompson Grayer, MD  Chart reviewed as part of pre-operative protocol coverage. Given past medical history and time since last visit, based on ACC/AHA guidelines, SHALLEN LUEDKE would be at acceptable risk for the planned procedure without further cardiovascular testing.   Patient with diagnosis of atrial fibrillation on Xarelto for anticoagulation.     Procedure: colonoscopy Date of procedure: TBD     CHA2DS2-VASc Score = 4   This indicates a 4.8% annual risk of stroke. The patient's score is based upon: CHF History: 0 HTN History: 1 Diabetes History: 0 Stroke History: 0 Vascular Disease History: 0 Age Score: 2 Gender Score: 1     CrCl 77 Platelet count 243   Per office protocol, patient can hold Xarelto for 1 days prior to procedure.   Patient will not need bridging with Lovenox (enoxaparin) around procedure.     Request indicates they do not need any meds held.  Not sure if they are okay doing procedure on Xarelto or not.  Ok to hold 1 day if needed  I will route this recommendation to the requesting party via Epic fax function and remove from pre-op pool.  Please call with questions.  Jossie Ng. Norva Bowe NP-C    11/01/2021, 7:59 AM [place] [address] Office [phone] Fax [phone]

## 2021-11-04 NOTE — Telephone Encounter (Signed)
Called the patient. No answer. Left her the information on her voicemail. Asked she call us back to get her colonoscopy scheduled.

## 2021-11-04 NOTE — Telephone Encounter (Signed)
Dr Carlean Purl Are you okay with me putting the patient back on the schedule?  Cardiology has cleared her. They also cleared her to hold the [facility] for 1 day prior. I had missed that she was on this. Thanks

## 2021-11-07 ENCOUNTER — Other Ambulatory Visit: Payer: Self-pay

## 2021-11-07 NOTE — Telephone Encounter (Signed)
Inbound call from patient. Rescheduled procedure 1/23. Advised to hold blood thinner 1 day before. She states she takes it at night. Advised understanding

## 2021-11-07 NOTE — Telephone Encounter (Signed)
New instructions mailed to the patient.

## 2021-12-23 ENCOUNTER — Ambulatory Visit (AMBULATORY_SURGERY_CENTER): Payer: Medicare HMO | Admitting: Internal Medicine

## 2021-12-23 ENCOUNTER — Encounter: Payer: Self-pay | Admitting: Internal Medicine

## 2021-12-23 ENCOUNTER — Encounter: Payer: Medicare HMO | Admitting: Internal Medicine

## 2021-12-23 VITALS — BP 156/47 | HR 73 | Temp 99.1°F | Resp 18 | Ht 61.0 in | Wt 187.0 lb

## 2021-12-23 DIAGNOSIS — I4891 Unspecified atrial fibrillation: Secondary | ICD-10-CM | POA: Diagnosis not present

## 2021-12-23 DIAGNOSIS — I2510 Atherosclerotic heart disease of native coronary artery without angina pectoris: Secondary | ICD-10-CM | POA: Diagnosis not present

## 2021-12-23 DIAGNOSIS — D122 Benign neoplasm of ascending colon: Secondary | ICD-10-CM | POA: Diagnosis not present

## 2021-12-23 DIAGNOSIS — D128 Benign neoplasm of rectum: Secondary | ICD-10-CM | POA: Diagnosis not present

## 2021-12-23 DIAGNOSIS — I10 Essential (primary) hypertension: Secondary | ICD-10-CM | POA: Diagnosis not present

## 2021-12-23 DIAGNOSIS — K5520 Angiodysplasia of colon without hemorrhage: Secondary | ICD-10-CM

## 2021-12-23 DIAGNOSIS — Z1211 Encounter for screening for malignant neoplasm of colon: Secondary | ICD-10-CM

## 2021-12-23 DIAGNOSIS — G4733 Obstructive sleep apnea (adult) (pediatric): Secondary | ICD-10-CM | POA: Diagnosis not present

## 2021-12-23 DIAGNOSIS — Z86010 Personal history of colonic polyps: Secondary | ICD-10-CM

## 2021-12-23 HISTORY — DX: Angiodysplasia of colon without hemorrhage: K55.20

## 2021-12-23 MED ORDER — SODIUM CHLORIDE 0.9 % IV SOLN: 500.0000 mL | Freq: Once | INTRAVENOUS | Status: DC

## 2021-12-23 NOTE — Progress Notes (Signed)
Report to PACU, RN, vss, BBS= Clear.  

## 2021-12-23 NOTE — Progress Notes (Signed)
[facility] History and Physical   Primary Care Physician:  [name], MD   Reason for Procedure:   Hx colon polyps + FHx CRCA  Plan:    colonoscopy     HPI: Julie Gill is a 81 y.o. female w/ hx adenomatous colon polyps (1 on last exam 2017) and maternal grandmother + maternal uncle w/ hx CRCA. She also has had some change in bowel habits - see 11/22 office visit. Here for colonoscopy, last dose Xarelto 1/21.   Past Medical History:  Diagnosis Date   Allergy    Anxiety    Arthritis    Asthma    dx 10 yrs ago   Atrial fibrillation (HCC)    CHADS VASC score 3. Intolerant to flecainide. s/p afib ablation 02/12/12   Cataract    removed bilaterally   Colon polyps    Diastolic dysfunction    Dysrhythmia    a fib   dx 2014   GERD (gastroesophageal reflux disease)    History of kidney infection     last one was approx 2009   Hyperlipidemia    Hypertension    lung ca dx'd 08/2016   Mild sleep apnea    wears NO equipment   Mitral regurgitation    mild   Neuromuscular disorder (HCC)    Obesity    Plantar fasciitis    Restless leg syndrome    Sinus bradycardia    Sleep apnea    Stress bladder incontinence, female    WEARS SMALL PADS    Past Surgical History:  Procedure Laterality Date   ABDOMINAL HYSTERECTOMY  1992   BSO   APPENDECTOMY     atrial fibrillation ablation  02/12/12   PVI by Dr Rayann Heman   ATRIAL FIBRILLATION ABLATION N/A 02/12/2012   Procedure: ATRIAL FIBRILLATION ABLATION;  Surgeon: Thompson Grayer, MD;  Location: [facility] CATH LAB;  Service: Cardiovascular;  Laterality: N/A;   BLADDER SUSPENSION     CARPAL TUNNEL RELEASE Right 01/26/2020   Procedure: CARPAL TUNNEL RELEASE;  Surgeon: Daryll Brod, MD;  Location: [place];  Service: Orthopedics;  Laterality: Right;   [place], 200,2004, 2007, 05/06/2011   adenomas   heels spurs     x 2   TEE WITHOUT CARDIOVERSION  02/11/2012   Procedure: TRANSESOPHAGEAL  ECHOCARDIOGRAM (TEE);  Surgeon: Larey Dresser, MD;  Location: [place];  Service: Cardiovascular;  Laterality: N/A;   TONSILLECTOMY AND ADENOIDECTOMY     VARICOSE VEIN SURGERY     VIDEO ASSISTED THORACOSCOPY (VATS)/WEDGE RESECTION Right 10/02/2016   Procedure: VIDEO ASSISTED THORACOSCOPY (VATS)/WEDGE RESECTION;  Surgeon: [name], MD;  Location: [place];  Service: Thoracic;  Laterality: Right;    Prior to Admission medications   Medication Sig Start Date End Date Taking? Authorizing Provider  acetaminophen (TYLENOL) 500 MG tablet Take 500 mg by mouth every 6 (six) hours as needed.   Yes [provider]  calcium carbonate (OS-CAL) 600 MG TABS Take 600 mg by mouth daily.   Yes [provider]  Cholecalciferol (VITAMIN D PO) Take 4,000 Units by mouth daily.     Yes [provider]  famotidine (PEPCID) 20 MG tablet Take 20 mg by mouth 2 (two) times daily.   Yes [provider]  lisinopril-hydrochlorothiazide (ZESTORETIC) 20-25 MG tablet Take 1 tablet by mouth daily. 06/17/21  Yes [provider]  lovastatin (MEVACOR) 40 MG tablet Take 80 mg by mouth daily.  09/11/12  Yes [provider]  MAGNESIUM PO Take 250 mg by mouth daily.   Yes [provider]  melatonin 5 MG TABS Take 10 mg by mouth at bedtime.   Yes [provider]  polyethylene glycol (MIRALAX / GLYCOLAX) 17 g packet Take 17 g by mouth daily.   Yes [provider]  potassium chloride SA (K-DUR,KLOR-CON) 20 MEQ tablet Take 20 mEq by mouth daily.   Yes [provider]  vitamin E 180 MG (400 UNITS) capsule Take 400 Units by mouth daily.    Yes [provider]  XARELTO 20 MG TABS tablet Take 20 mg by mouth daily with supper.  04/11/16  Yes [provider]  clonazePAM (KLONOPIN) 0.5 MG tablet Take 0.25 mg by mouth 2 (two) times daily as needed for anxiety.     [provider]  nitroGLYCERIN (NITROSTAT) 0.4 MG SL  tablet Place 1 tablet (0.4 mg total) under the tongue every 5 (five) minutes as needed for chest pain. 08/13/20   Allred, Jeneen Rinks, MD  senna (SENOKOT) 8.6 MG tablet Take 1 tablet by mouth daily. Patient not taking: Reported on 12/23/2021    [provider]  traMADol (ULTRAM) 50 MG tablet Take 1 tablet (50 mg total) by mouth every 6 (six) hours as needed. 01/26/20   Daryll Brod, MD    Current Outpatient Medications  Medication Sig Dispense Refill   acetaminophen (TYLENOL) 500 MG tablet Take 500 mg by mouth every 6 (six) hours as needed.     calcium carbonate (OS-CAL) 600 MG TABS Take 600 mg by mouth daily.     Cholecalciferol (VITAMIN D PO) Take 4,000 Units by mouth daily.       famotidine (PEPCID) 20 MG tablet Take 20 mg by mouth 2 (two) times daily.     lisinopril-hydrochlorothiazide (ZESTORETIC) 20-25 MG tablet Take 1 tablet by mouth daily.     lovastatin (MEVACOR) 40 MG tablet Take 80 mg by mouth daily.      MAGNESIUM PO Take 250 mg by mouth daily.     melatonin 5 MG TABS Take 10 mg by mouth at bedtime.     polyethylene glycol (MIRALAX / GLYCOLAX) 17 g packet Take 17 g by mouth daily.     potassium chloride SA (K-DUR,KLOR-CON) 20 MEQ tablet Take 20 mEq by mouth daily.     vitamin E 180 MG (400 UNITS) capsule Take 400 Units by mouth daily.      XARELTO 20 MG TABS tablet Take 20 mg by mouth daily with supper.      clonazePAM (KLONOPIN) 0.5 MG tablet Take 0.25 mg by mouth 2 (two) times daily as needed for anxiety.      nitroGLYCERIN (NITROSTAT) 0.4 MG SL tablet Place 1 tablet (0.4 mg total) under the tongue every 5 (five) minutes as needed for chest pain. 25 tablet 1   senna (SENOKOT) 8.6 MG tablet Take 1 tablet by mouth daily. (Patient not taking: Reported on 12/23/2021)     traMADol (ULTRAM) 50 MG tablet Take 1 tablet (50 mg total) by mouth every 6 (six) hours as needed. 20 tablet 0   Current Facility-Administered Medications  Medication Dose Route Frequency Provider Last Rate Last  Admin   0.9 %  sodium chloride infusion  500 mL Intravenous Once Gatha Mayer, MD        Allergies as of 12/23/2021 - Review Complete 12/23/2021  Allergen Reaction Noted   Codeine Other (See Comments) 04/25/2011   Prednisone Other (See Comments) 04/25/2011  Family History  Problem Relation Age of Onset   Congestive Heart Failure Mother    Lung cancer Father    Diabetes Brother    Hypertension Brother    Colon cancer Maternal Uncle    Colon cancer Maternal Grandmother    Breast cancer Neg Hx    Esophageal cancer Neg Hx    Rectal cancer Neg Hx    Stomach cancer Neg Hx     Social History   Socioeconomic History   Marital status: Married    Spouse name: Not on file   Number of children: 2   Years of education: Not on file   Highest education level: Not on file  Occupational History   Occupation: retired  Tobacco Use   Smoking status: Former    Types: Cigarettes    Quit date: 09/17/1985    Years since quitting: 36.2   Smokeless tobacco: Never   Tobacco comments:    5 CIG /DAY  Vaping Use   Vaping Use: Never used  Substance and Sexual Activity   Alcohol use: No   Drug use: No   Sexual activity: Yes    Birth control/protection: Post-menopausal  Other Topics Concern   Not on file  Social History Narrative   Pt lives in [place] with spouse.  2 grown children.   Retired Multimedia programmer.    Review of Systems:  All other review of systems negative except as mentioned in the HPI.  Physical Exam: Vital signs BP (!) 160/73    Pulse 76    Temp 99.1 F (37.3 C) (Temporal)    Resp 17    Ht 5\' 1"  (1.549 m)    Wt 187 lb (84.8 kg)    SpO2 97%    BMI 35.33 kg/m   General:   Alert,  Well-developed, well-nourished, pleasant and cooperative in NAD Lungs:  Clear throughout to auscultation.   Heart:  Regular rate and rhythm; no murmurs, clicks, rubs,  or gallops. Abdomen:  Soft, nontender and nondistended. Normal bowel sounds.   Neuro/Psych:  Alert and cooperative. Normal  mood and affect. A and O x 3   @Deke Tilghman  Simonne Maffucci, MD, [facility] [phone] (pager) 12/23/2021 1:53 PM@

## 2021-12-23 NOTE — Progress Notes (Signed)
VS-DT 

## 2021-12-23 NOTE — Patient Instructions (Addendum)
I removed 3 tiny polyps today. I will not be recommending any more routine colonoscopy tests.  Restart Xarelto tomorrow.  I appreciate the opportunity to care for you. Gatha Mayer, MD, FACG   YOU HAD AN ENDOSCOPIC PROCEDURE TODAY AT [facility]:   Refer to the procedure report that was given to you for any specific questions about what was found during the examination.  If the procedure report does not answer your questions, please call your gastroenterologist to clarify.  If you requested that your care partner not be given the details of your procedure findings, then the procedure report has been included in a sealed envelope for you to review at your convenience later.  YOU SHOULD EXPECT: Some feelings of bloating in the abdomen. Passage of more gas than usual.  Walking can help get rid of the air that was put into your GI tract during the procedure and reduce the bloating. If you had a lower endoscopy (such as a colonoscopy or flexible sigmoidoscopy) you may notice spotting of blood in your stool or on the toilet paper. If you underwent a bowel prep for your procedure, you may not have a normal bowel movement for a few days.  Please Note:  You might notice some irritation and congestion in your nose or some drainage.  This is from the oxygen used during your procedure.  There is no need for concern and it should clear up in a day or so.  SYMPTOMS TO REPORT IMMEDIATELY:  Following lower endoscopy (colonoscopy or flexible sigmoidoscopy):  Excessive amounts of blood in the stool  Significant tenderness or worsening of abdominal pains  Swelling of the abdomen that is new, acute  Fever of 100F or higher   For urgent or emergent issues, a gastroenterologist can be reached at any hour by calling [phone]. Do not use MyChart messaging for urgent concerns.    DIET:  We do recommend a small meal at first, but then you may proceed to your regular diet.  Drink plenty  of fluids but you should avoid alcoholic beverages for 24 hours.  ACTIVITY:  You should plan to take it easy for the rest of today and you should NOT DRIVE or use heavy machinery until tomorrow (because of the sedation medicines used during the test).    FOLLOW UP: Our staff will call the number listed on your records 48-72 hours following your procedure to check on you and address any questions or concerns that you may have regarding the information given to you following your procedure. If we do not reach you, we will leave a message.  We will attempt to reach you two times.  During this call, we will ask if you have developed any symptoms of COVID 19. If you develop any symptoms (ie: fever, flu-like symptoms, shortness of breath, cough etc.) before then, please call [phone].  If you test positive for Covid 19 in the 2 weeks post procedure, please call and report this information to [place].    If any biopsies were taken you will be contacted by phone or by letter within the next 1-3 weeks.  Please call us at [phone] if you have not heard about the biopsies in 3 weeks.    SIGNATURES/CONFIDENTIALITY: You and/or your care partner have signed paperwork which will be entered into your electronic medical record.  These signatures attest to the fact that that the information above on your After Visit Summary has been reviewed and is  understood.  Full responsibility of the confidentiality of this discharge information lies with you and/or your care-partner.

## 2021-12-23 NOTE — Op Note (Signed)
[facility] Patient Name: Julie Gill Procedure Date: 12/23/2021 1:50 PM MRN: [id number] Endoscopist: Gatha Mayer , MD Age: 81 Referring MD:  Date of Birth: 05-Jun-1941 Gender: Female Account #: [id number] Procedure:                Colonoscopy Indications:              Surveillance: Personal history of adenomatous                            polyps on last colonoscopy 5 years ago Medicines:                Propofol per Anesthesia, Monitored Anesthesia Care Procedure:                Pre-Anesthesia Assessment:                           - Prior to the procedure, a History and Physical                            was performed, and patient medications and                            allergies were reviewed. The patient's tolerance of                            previous anesthesia was also reviewed. The risks                            and benefits of the procedure and the sedation                            options and risks were discussed with the patient.                            All questions were answered, and informed consent                            was obtained. Prior Anticoagulants: The patient                            last took Xarelto (rivaroxaban) 2 days prior to the                            procedure. ASA Grade Assessment: III - A patient                            with severe systemic disease. After reviewing the                            risks and benefits, the patient was deemed in                            satisfactory condition to undergo the procedure.  After obtaining informed consent, the colonoscope                            was passed under direct vision. Throughout the                            procedure, the patient's blood pressure, pulse, and                            oxygen saturations were monitored continuously. The                            Olympus Colonoscope #[id number] was introduced through                             the anus and advanced to the the cecum, identified                            by appendiceal orifice and ileocecal valve. The                            colonoscopy was performed without difficulty. The                            patient tolerated the procedure well. The quality                            of the bowel preparation was excellent. The bowel                            preparation used was SuTab via split dose                            instruction. The ileocecal valve, appendiceal                            orifice, and rectum were photographed. Scope In: 2:03:42 PM Scope Out: 2:19:16 PM Scope Withdrawal Time: 0 hours 12 minutes 50 seconds  Total Procedure Duration: 0 hours 15 minutes 34 seconds  Findings:                 The perianal and digital rectal examinations were                            normal.                           Three sessile polyps were found in the proximal                            rectum and ascending colon. The polyps were                            diminutive in size. These polyps were removed with  a cold snare. Resection and retrieval were                            complete. Verification of patient identification                            for the specimen was done. Estimated blood loss was                            minimal.                           A diffuse area of melanotic mucosa was found in the                            entire colon.                           Internal hemorrhoids were found.                           A single small angiodysplastic lesion was found in                            the cecum.                           A few diverticula were found in the sigmoid colon.                           The exam was otherwise without abnormality on                            direct and retroflexion views. Complications:            No immediate complications. Estimated Blood Loss:     Estimated blood  loss was minimal. Impression:               - Three diminutive polyps in the proximal rectum                            and in the ascending colon, removed with a cold                            snare. Resected and retrieved.                           - Melanotic mucosa in the entire examined colon.                           - Internal hemorrhoids.                           - A single colonic angiodysplastic lesion.                           -  Diverticulosis in the sigmoid colon.                           - The examination was otherwise normal on direct                            and retroflexion views.                           - Personal history of colonic polyps. FHx CRCA                            maternal grandmother and uncle Recommendation:           - Patient has a contact number available for                            emergencies. The signs and symptoms of potential                            delayed complications were discussed with the                            patient. Return to normal activities tomorrow.                            Written discharge instructions were provided to the                            patient.                           - Resume previous diet.                           - Continue present medications.                           - Await pathology results.                           - No repeat colonoscopy due to age.                           - Resume Xarelto (rivaroxaban) at prior dose                            tomorrow. Gatha Mayer, MD 12/23/2021 2:27:26 PM This report has been signed electronically.

## 2021-12-23 NOTE — Progress Notes (Signed)
Called to room to assist during endoscopic procedure.  Patient ID and intended procedure confirmed with present staff. Received instructions for my participation in the procedure from the performing physician.  

## 2021-12-25 ENCOUNTER — Telehealth: Payer: Self-pay | Admitting: *Deleted

## 2021-12-25 ENCOUNTER — Telehealth: Payer: Self-pay

## 2021-12-25 NOTE — Telephone Encounter (Signed)
°  Follow up Call-  Call back number 12/23/2021  Post procedure Call Back phone  # [phone]  Permission to leave phone message Yes  Some recent data might be hidden     Patient questions: Message left to call us if necessray.

## 2021-12-25 NOTE — Telephone Encounter (Signed)
Attempted f/u call. No answer, left VM. 

## 2021-12-30 ENCOUNTER — Encounter: Payer: Self-pay | Admitting: Internal Medicine

## 2022-02-17 ENCOUNTER — Other Ambulatory Visit: Payer: Self-pay | Admitting: Family Medicine

## 2022-02-17 DIAGNOSIS — Z1231 Encounter for screening mammogram for malignant neoplasm of breast: Secondary | ICD-10-CM

## 2022-02-24 DIAGNOSIS — Z0000 Encounter for general adult medical examination without abnormal findings: Secondary | ICD-10-CM | POA: Diagnosis not present

## 2022-02-24 DIAGNOSIS — Z1389 Encounter for screening for other disorder: Secondary | ICD-10-CM | POA: Diagnosis not present

## 2022-02-26 ENCOUNTER — Encounter (HOSPITAL_COMMUNITY): Payer: Self-pay | Admitting: Physician Assistant

## 2022-02-26 ENCOUNTER — Other Ambulatory Visit: Payer: Self-pay

## 2022-02-26 ENCOUNTER — Ambulatory Visit (HOSPITAL_COMMUNITY)
Admission: RE | Admit: 2022-02-26 | Discharge: 2022-02-26 | Disposition: A | Discharge status: Home / Self Care | Payer: Medicare HMO | Source: Ambulatory Visit | Attending: Physician Assistant | Admitting: Physician Assistant

## 2022-02-26 VITALS — BP 140/86 | HR 70 | Ht 61.0 in | Wt 182.0 lb

## 2022-02-26 DIAGNOSIS — D6869 Other thrombophilia: Secondary | ICD-10-CM

## 2022-02-26 DIAGNOSIS — Z7901 Long term (current) use of anticoagulants: Secondary | ICD-10-CM | POA: Insufficient documentation

## 2022-02-26 DIAGNOSIS — D6859 Other primary thrombophilia: Secondary | ICD-10-CM | POA: Insufficient documentation

## 2022-02-26 DIAGNOSIS — I480 Paroxysmal atrial fibrillation: Secondary | ICD-10-CM

## 2022-02-26 DIAGNOSIS — Z6834 Body mass index (BMI) 34.0-34.9, adult: Secondary | ICD-10-CM | POA: Insufficient documentation

## 2022-02-26 DIAGNOSIS — E785 Hyperlipidemia, unspecified: Secondary | ICD-10-CM | POA: Insufficient documentation

## 2022-02-26 DIAGNOSIS — E669 Obesity, unspecified: Secondary | ICD-10-CM | POA: Diagnosis not present

## 2022-02-26 DIAGNOSIS — I10 Essential (primary) hypertension: Secondary | ICD-10-CM | POA: Insufficient documentation

## 2022-02-26 NOTE — Progress Notes (Signed)
? ? ?Primary Care Physician: [name], MD ?Primary Electrophysiologist: Dr Rayann Heman (Dr Quentin Ore new) ?Referring Physician: Dr Rayann Heman ? ? ?Julie Gill is a 81 y.o. female with a history of HLD, HTN, atrial fibrillation who presents for follow up in the [facility].  The patient was initially diagnosed with atrial fibrillation remotely and had an afib ablation in 2013. Patient is on Xarelto for a CHADS2VASC score of 4.  ? ?On follow up today, patient reports that she has done well since her last visit. She denies any tachypalpitations. No bleeding issues on anticoagulation. She has recently started a diet and exercise program.  ? ?Today, she denies symptoms of palpitations, chest pain, shortness of breath, orthopnea, PND, lower extremity edema, dizziness, presyncope, syncope, snoring, daytime somnolence, bleeding, or neurologic sequela. The patient is tolerating medications without difficulties and is otherwise without complaint today.  ? ? ?Atrial Fibrillation Risk Factors: ? ?she does have symptoms or diagnosis of sleep apnea. Mild, not requiring CPAP. ?she does not have a history of rheumatic fever. ?she does not have a history of alcohol use. ? ? ?she has a BMI of Body mass index is 34.39 kg/m?Marland KitchenMarland Kitchen ?Filed Weights  ? 02/26/22 1107  ?Weight: 82.6 kg  ? ? ? ?Family History  ?Problem Relation Age of Onset  ? Congestive Heart Failure Mother   ? Lung cancer Father   ? Diabetes Brother   ? Hypertension Brother   ? Colon cancer Maternal Uncle   ? Colon cancer Maternal Grandmother   ? Breast cancer Neg Hx   ? Esophageal cancer Neg Hx   ? Rectal cancer Neg Hx   ? Stomach cancer Neg Hx   ? ? ? ?Atrial Fibrillation Management history: ? ?Previous antiarrhythmic drugs: flecainide, dofetilide  ?Previous cardioversions: none ?Previous ablations: 2013 ?CHADS2VASC score: 4 ?Anticoagulation history: warfarin, Xarelto ? ? ?Past Medical History:  ?Diagnosis Date  ? Allergy   ? Angiodysplasia of cecum  12/23/2021  ? 1-2 mm  ? Anxiety   ? Arthritis   ? Asthma   ? dx 10 yrs ago  ? Atrial fibrillation ([place])   ? CHADS VASC score 3. Intolerant to flecainide. s/p afib ablation 02/12/12  ? Cataract   ? removed bilaterally  ? Colon polyps   ? Diastolic dysfunction   ? Dysrhythmia   ? a fib   dx 2014  ? GERD (gastroesophageal reflux disease)   ? History of kidney infection   ?  last one was approx 2009  ? Hyperlipidemia   ? Hypertension   ? lung ca dx'd 08/2016  ? Mild sleep apnea   ? wears NO equipment  ? Mitral regurgitation   ? mild  ? Neuromuscular disorder ([place])   ? Obesity   ? Plantar fasciitis   ? Restless leg syndrome   ? Sinus bradycardia   ? Sleep apnea   ? Stress bladder incontinence, female   ? WEARS SMALL PADS  ? ?Past Surgical History:  ?Procedure Laterality Date  ? ABDOMINAL HYSTERECTOMY  1992  ? BSO  ? APPENDECTOMY    ? atrial fibrillation ablation  02/12/12  ? PVI by Dr Rayann Heman  ? ATRIAL FIBRILLATION ABLATION N/A 02/12/2012  ? Procedure: ATRIAL FIBRILLATION ABLATION;  Surgeon: Thompson Grayer, MD;  Location: [facility] CATH [facility];  Service: Cardiovascular;  Laterality: N/A;  ? BLADDER SUSPENSION    ? CARPAL TUNNEL RELEASE Right 01/26/2020  ? Procedure: CARPAL TUNNEL RELEASE;  Surgeon: Daryll Brod, MD;  Location: [facility];  Service: Orthopedics;  Laterality: Right;  ? [place], 200,2004, 2007, 05/06/2011  ? adenomas  ? heels spurs    ? x 2  ? TEE WITHOUT CARDIOVERSION  02/11/2012  ? Procedure: TRANSESOPHAGEAL ECHOCARDIOGRAM (TEE);  Surgeon: Larey Dresser, MD;  Location: [facility];  Service: Cardiovascular;  Laterality: N/A;  ? TONSILLECTOMY AND ADENOIDECTOMY    ? VARICOSE VEIN SURGERY    ? VIDEO ASSISTED THORACOSCOPY (VATS)/WEDGE RESECTION Right 10/02/2016  ? Procedure: VIDEO ASSISTED THORACOSCOPY (VATS)/WEDGE RESECTION;  Surgeon: [name], MD;  Location: [place];  Service: Thoracic;  Laterality: Right;  ? ? ?Current Outpatient Medications  ?Medication Sig Dispense Refill   ? acetaminophen (TYLENOL) 500 MG tablet Take 500 mg by mouth every 6 (six) hours as needed.    ? calcium carbonate (OS-CAL) 600 MG TABS Take 600 mg by mouth daily.    ? Cholecalciferol (VITAMIN D PO) Take 4,000 Units by mouth daily.      ? clonazePAM (KLONOPIN) 0.5 MG tablet Take 0.25 mg by mouth 2 (two) times daily as needed for anxiety.     ? famotidine (PEPCID) 20 MG tablet Take 20 mg by mouth 2 (two) times daily.    ? lisinopril-hydrochlorothiazide (ZESTORETIC) 20-25 MG tablet Take 1 tablet by mouth daily.    ? lovastatin (MEVACOR) 40 MG tablet Take 80 mg by mouth daily.     ? MAGNESIUM PO Take 250 mg by mouth daily.    ? melatonin 5 MG TABS Take 10 mg by mouth at bedtime.    ? nitroGLYCERIN (NITROSTAT) 0.4 MG SL tablet Place 1 tablet (0.4 mg total) under the tongue every 5 (five) minutes as needed for chest pain. 25 tablet 1  ? polyethylene glycol (MIRALAX / GLYCOLAX) 17 g packet Take 17 g by mouth daily.    ? potassium chloride SA (K-DUR,KLOR-CON) 20 MEQ tablet Take 20 mEq by mouth daily.    ? senna (SENOKOT) 8.6 MG tablet Take 1 tablet by mouth daily.    ? traMADol (ULTRAM) 50 MG tablet Take 1 tablet (50 mg total) by mouth every 6 (six) hours as needed. 20 tablet 0  ? vitamin E 180 MG (400 UNITS) capsule Take 400 Units by mouth daily.     ? XARELTO 20 MG TABS tablet Take 20 mg by mouth daily with supper.     ? ?No current facility-administered medications for this encounter.  ? ? ?Allergies  ?Allergen Reactions  ? Codeine Other (See Comments)  ?  Skin crawls, jittery, agitated  ? Prednisone Other (See Comments)  ?  Tachycardia, light headed, rash  ? ? ?Social History  ? ?Socioeconomic History  ? Marital status: Married  ?  Spouse name: Not on file  ? Number of children: 2  ? Years of education: Not on file  ? Highest education level: Not on file  ?Occupational History  ? Occupation: retired  ?Tobacco Use  ? Smoking status: Former  ?  Types: Cigarettes  ?  Quit date: 09/17/1985  ?  Years since quitting: 36.4   ? Smokeless tobacco: Never  ? Tobacco comments:  ?  Former smoker 02/26/22  ?Vaping Use  ? Vaping Use: Never used  ?Substance and Sexual Activity  ? Alcohol use: No  ? Drug use: No  ? Sexual activity: Yes  ?  Birth control/protection: Post-menopausal  ?Other Topics Concern  ? Not on file  ?Social History Narrative  ? Pt lives in [place] with spouse.  2 grown children.  ? Retired Multimedia programmer.  ? ?Social Determinants of Health  ? ?Financial Resource Strain: Not on file  ?Food Insecurity: Not on file  ?Transportation Needs: Not on file  ?Physical Activity: Not on file  ?Stress: Not on file  ?Social Connections: Not on file  ?Intimate Partner Violence: Not on file  ? ? ? ?ROS- All systems are reviewed and negative except as per the HPI above. ? ?Physical Exam: ?Vitals:  ? 02/26/22 1107  ?BP: 140/86  ?Pulse: 70  ?Weight: 82.6 kg  ?Height: 5\' 1"  (1.549 m)  ? ? ?GEN- The patient is a well appearing elderly obese female, alert and oriented x 3 today.   ?HEENT-head normocephalic, atraumatic, sclera clear, conjunctiva pink, hearing intact, trachea midline. ?Lungs- Clear to ausculation bilaterally, normal work of breathing ?Heart- Regular rate and rhythm, no murmurs, rubs or gallops  ?GI- soft, NT, ND, + BS ?Extremities- no clubbing, cyanosis, or edema ?MS- no significant deformity or atrophy ?Skin- no rash or lesion ?Psych- euthymic mood, full affect ?Neuro- strength and sensation are intact ? ? ?Wt Readings from Last 3 Encounters:  ?02/26/22 82.6 kg  ?12/23/21 84.8 kg  ?10/17/21 84.8 kg  ? ? ?EKG today demonstrates  ?SR, PVC ?Vent. rate 70 BPM ?PR interval 156 ms ?QRS duration 86 ms ?QT/QTcB 400/432 ms ? ?Echo 04/02/21 demonstrated  ? 1. Left ventricular ejection fraction, by estimation, is 60 to 65%. The  ?left ventricle has normal function. The left ventricle has no regional  ?wall motion abnormalities. Left ventricular diastolic parameters were  ?normal.  ? 2. Right ventricular systolic function is normal. The right  ventricular  ?size is normal. Tricuspid regurgitation signal is inadequate for assessing PA pressure.  ? 3. The mitral valve is degenerative. Trivial mitral valve regurgitation.  ?No evidence of mitral steno

## 2022-03-07 ENCOUNTER — Ambulatory Visit: Payer: Medicare HMO

## 2022-03-07 ENCOUNTER — Ambulatory Visit: Payer: Medicare HMO | Admitting: Cardiology

## 2022-03-14 ENCOUNTER — Other Ambulatory Visit (HOSPITAL_COMMUNITY): Payer: Self-pay

## 2022-03-14 MED ORDER — XARELTO 20 MG PO TABS: 20.0000 mg | ORAL_TABLET | Freq: Every day | ORAL | 0 refills | Status: DC

## 2022-03-20 ENCOUNTER — Emergency Department (HOSPITAL_COMMUNITY): Payer: Medicare HMO

## 2022-03-20 ENCOUNTER — Other Ambulatory Visit: Payer: Self-pay

## 2022-03-20 ENCOUNTER — Encounter (HOSPITAL_COMMUNITY): Payer: Self-pay | Admitting: Pharmacy Technician

## 2022-03-20 ENCOUNTER — Emergency Department (HOSPITAL_COMMUNITY)
Admission: EM | Admit: 2022-03-20 | Discharge: 2022-03-20 | Disposition: A | Discharge status: Home / Self Care | Payer: Medicare HMO | Attending: Emergency Medicine | Admitting: Emergency Medicine

## 2022-03-20 DIAGNOSIS — Z85118 Personal history of other malignant neoplasm of bronchus and lung: Secondary | ICD-10-CM | POA: Diagnosis not present

## 2022-03-20 DIAGNOSIS — R002 Palpitations: Secondary | ICD-10-CM | POA: Diagnosis not present

## 2022-03-20 DIAGNOSIS — Z7901 Long term (current) use of anticoagulants: Secondary | ICD-10-CM | POA: Insufficient documentation

## 2022-03-20 DIAGNOSIS — R29818 Other symptoms and signs involving the nervous system: Secondary | ICD-10-CM | POA: Diagnosis not present

## 2022-03-20 DIAGNOSIS — I10 Essential (primary) hypertension: Secondary | ICD-10-CM | POA: Diagnosis not present

## 2022-03-20 DIAGNOSIS — I480 Paroxysmal atrial fibrillation: Secondary | ICD-10-CM | POA: Insufficient documentation

## 2022-03-20 DIAGNOSIS — R531 Weakness: Secondary | ICD-10-CM | POA: Insufficient documentation

## 2022-03-20 DIAGNOSIS — Z79899 Other long term (current) drug therapy: Secondary | ICD-10-CM | POA: Diagnosis not present

## 2022-03-20 DIAGNOSIS — R42 Dizziness and giddiness: Secondary | ICD-10-CM | POA: Diagnosis not present

## 2022-03-20 DIAGNOSIS — I4891 Unspecified atrial fibrillation: Secondary | ICD-10-CM | POA: Diagnosis not present

## 2022-03-20 DIAGNOSIS — I6782 Cerebral ischemia: Secondary | ICD-10-CM | POA: Diagnosis not present

## 2022-03-20 LAB — CBC
HCT: 38.6 % (ref 36.0–46.0)
Hemoglobin: 11.7 g/dL — ABNORMAL LOW (ref 12.0–15.0)
MCH: 27.9 pg (ref 26.0–34.0)
MCHC: 30.3 g/dL (ref 30.0–36.0)
MCV: 92.1 fL (ref 80.0–100.0)
Platelets: 287 10*3/uL (ref 150–400)
RBC: 4.19 MIL/uL (ref 3.87–5.11)
RDW: 14 % (ref 11.5–15.5)
WBC: 7.4 10*3/uL (ref 4.0–10.5)
nRBC: 0 % (ref 0.0–0.2)

## 2022-03-20 LAB — URINALYSIS, ROUTINE W REFLEX MICROSCOPIC
Bacteria, UA: NONE SEEN
Bilirubin Urine: NEGATIVE
Glucose, UA: NEGATIVE mg/dL
Ketones, ur: NEGATIVE mg/dL
Leukocytes,Ua: NEGATIVE
Nitrite: NEGATIVE
Protein, ur: NEGATIVE mg/dL
Specific Gravity, Urine: 1.013 (ref 1.005–1.030)
pH: 5 (ref 5.0–8.0)

## 2022-03-20 LAB — BASIC METABOLIC PANEL
Anion gap: 5 (ref 5–15)
BUN: 28 mg/dL — ABNORMAL HIGH (ref 8–23)
CO2: 27 mmol/L (ref 22–32)
Calcium: 8.8 mg/dL — ABNORMAL LOW (ref 8.9–10.3)
Chloride: 107 mmol/L (ref 98–111)
Creatinine, Ser: 0.93 mg/dL (ref 0.44–1.00)
GFR, Estimated: >60 mL/min (ref >60)
Glucose, Bld: 125 mg/dL — ABNORMAL HIGH (ref 70–99)
Potassium: 4.2 mmol/L (ref 3.5–5.1)
Sodium: 139 mmol/L (ref 135–145)

## 2022-03-20 LAB — TROPONIN I (HIGH SENSITIVITY)
Troponin I (High Sensitivity): 47 ng/L — ABNORMAL HIGH (ref <18)
Troponin I (High Sensitivity): 43 ng/L — ABNORMAL HIGH (ref <18)

## 2022-03-20 MED ORDER — LORAZEPAM 2 MG/ML IJ SOLN
1.0000 mg | Freq: Once | INTRAMUSCULAR | Status: AC
  Administered 2022-03-20: 1 mg via INTRAVENOUS
  Filled 2022-03-20: qty 1

## 2022-03-20 NOTE — ED Notes (Signed)
Ambulated pt to bathroom. Pt unsteady and feels off balance still. ?

## 2022-03-20 NOTE — ED Provider Notes (Signed)
Care assumed from previous provider at shift change.  See note for full HPI. ? ?In summation 81 year old history of paroxysmal A-fib, chronic anticoagulation here for evaluation of intermittent palpitations earlier today as well as associated dizziness and lightheadedness.  Possible weakness.  Felt she was going to fall when she walked which is abnormal for her. ? ?On arrival she had a nonfocal neuro exam without deficits. ?Troponin slightly elevated however flat.  No chest pain, shortness of breath. ?MRI does not show any evidence of acute stroke. ? ?Plan to follow-up on urine, discharge home ? ?Previous provider thought patient needed outpatient referral to neurology which they have placed. ?Physical Exam  ?BP (!) 135/59   Pulse 67   Temp 98.5 ?F (36.9 ?C) (Oral)   Resp 16   Ht 5\' 1"  (1.549 m)   Wt 81.6 kg   SpO2 97%   BMI 34.01 kg/m?  ? ?Physical Exam ?Physical Exam  ?Constitutional: Pt is oriented to person, place, and time. Pt appears well-developed and well-nourished. No distress.  ?HENT:  ?Head: Normocephalic and atraumatic.  ?Mouth/Throat: Oropharynx is clear and moist.  ?Eyes: Conjunctivae and EOM are normal. Pupils are equal, round, and reactive to light. No scleral icterus.  ?No horizontal, vertical or rotational nystagmus  ?Neck: Normal range of motion. Neck supple.  ?Full active and passive ROM without pain ?No midline or paraspinal tenderness ?No nuchal rigidity or meningeal signs  ?Cardiovascular: Normal rate, regular rhythm and intact distal pulses.   ?Pulmonary/Chest: Effort normal and breath sounds normal. No respiratory distress. Pt has no wheezes. No rales.  ?Abdominal: Soft. Bowel sounds are normal. There is no tenderness. There is no rebound and no guarding.  ?Musculoskeletal: Normal range of motion.  ?Lymphadenopathy:  ?  No cervical adenopathy.  ?Neurological: Pt. is alert and oriented to person, place, and time. He has normal reflexes. No cranial nerve deficit.  Exhibits normal muscle  tone. Coordination normal.  ?Mental Status:  ?Alert, oriented, thought content appropriate. Speech fluent without evidence of aphasia. Able to follow 2 step commands without difficulty.  ?Cranial Nerves:  ?II:  Peripheral visual fields grossly normal, pupils equal, round, reactive to light ?III,IV, VI: ptosis not present, extra-ocular motions intact bilaterally  ?V,VII: smile symmetric, facial light touch sensation equal ?VIII: hearing grossly normal bilaterally  ?IX,X: midline uvula rise  ?XI: bilateral shoulder shrug equal and strong ?XII: midline tongue extension  ?Motor:  ?5/5 in upper and lower extremities bilaterally including strong and equal grip strength and dorsiflexion/plantar flexion ?Sensory: Pinprick and light touch normal in all extremities.  ?Deep Tendon Reflexes: 2+ and symmetric  ?Cerebellar: normal finger-to-nose with bilateral upper extremities ?Gait: gait with walker ?CV: distal pulses palpable throughout   ?Skin: Skin is warm and dry. No rash noted. Pt is not diaphoretic.  ?Psychiatric: Pt has a normal mood and affect. Behavior is normal. Judgment and thought content normal.  ?Nursing note and vitals reviewed.  ?Procedures  ?Procedures ?Labs Reviewed  ?BASIC METABOLIC PANEL - Abnormal; Notable for the following components:  ?    Result Value  ? Glucose, Bld 125 (*)   ? BUN 28 (*)   ? Calcium 8.8 (*)   ? All other components within normal limits  ?CBC - Abnormal; Notable for the following components:  ? Hemoglobin 11.7 (*)   ? All other components within normal limits  ?URINALYSIS, ROUTINE W REFLEX MICROSCOPIC - Abnormal; Notable for the following components:  ? Color, Urine STRAW (*)   ? Hgb urine dipstick  SMALL (*)   ? All other components within normal limits  ?TROPONIN I (HIGH SENSITIVITY) - Abnormal; Notable for the following components:  ? Troponin I (High Sensitivity) 47 (*)   ? All other components within normal limits  ?TROPONIN I (HIGH SENSITIVITY) - Abnormal; Notable for the following  components:  ? Troponin I (High Sensitivity) 43 (*)   ? All other components within normal limits  ? DG Chest 2 View ? ?Result Date: 03/20/2022 ?CLINICAL DATA:  Atrial fibrillation, dizziness and unsteadiness EXAM: CHEST - 2 VIEW COMPARISON:  February 05, 2017 FINDINGS: Mild cardiomegaly. Mediastinal contours are within normal limits. Small area of opacity at the left lung base near the CP angle likely atelectasis. Moderate thoracic spondylosis with prominent marginal osteophytes. IMPRESSION: Heart is borderline. Small area of likely atelectasis at the left lung base. Electronically Signed   By: Frazier Richards M.D.   On: 03/20/2022 10:18  ? ?MR BRAIN WO CONTRAST ? ?Result Date: 03/20/2022 ?CLINICAL DATA:  Neuro deficit, acute, stroke suspected EXAM: MRI HEAD WITHOUT CONTRAST TECHNIQUE: Multiplanar, multiecho pulse sequences of the brain and surrounding structures were obtained without intravenous contrast. COMPARISON:  None. FINDINGS: Brain: There is no acute infarction or intracranial hemorrhage. There is no intracranial mass, mass effect, or edema. There is no hydrocephalus or extra-axial fluid collection. Ventricles and sulci are within normal limits in size and configuration. Patchy T2 hyperintensity in the supratentorial and pontine white matter is nonspecific but may reflect mild chronic microvascular ischemic changes. Vascular: Major vessel flow voids at the skull base are preserved. Skull and upper cervical spine: Normal marrow signal is preserved. Sinuses/Orbits: Paranasal sinuses are aerated. Bilateral lens replacements. Other: Sella is unremarkable.  Mastoid air cells are clear. IMPRESSION: No acute infarction, hemorrhage, or mass. Mild chronic microvascular ischemic changes. Electronically Signed   By: Macy Mis M.D.   On: 03/20/2022 14:04    ?ED Course / MDM  ? ?Clinical Course as of 03/20/22 1854  ?Thu Mar 20, 2022  ?1306 Troponin I (High Sensitivity)(!): 43 [CR]  ?1306 Second troponin slightly decreased  [CR]  ?1510 Palpitation 45 min, intermittent, anticoag, weakness and gait issues. MR neg. Trop flat, FU on UA, plan to dc home after Urine. Walker to ambulate. PO challenge [BH]  ?  ?Clinical Course User Index ?[BH] Jarmarcus Wambold A, PA-C ?[CR] Wilnette Kales, PA  ? ?Care assumed from previous provider, see note for full HPI. ? ?In summation 81 year old here for evaluation of dizziness, weakness, gait abnormality as well as intermittent palpitations consistent with her atrial fibrillation. ? ?Labs and imaging personally viewed and interpreted ?MRI without significant abnormality, specifically no stroke ?Troponin flat, 47, 43 ?Metabolic panel without significant abnormality ?CBC without leukocytosis, hemoglobin 11.7 similar to prior ?Chest x-ray without edema, pneumothorax, effusion ?EKG without ischemic changes ?UA negative for infection ? ?Discussed results with patient, family in room.  She is ambulatory with walker here.  Symptoms have improved.  I discussed close follow-up, return for new or worsening symptoms, family agreeable. ? ?Low suspicion for ACS, CVA, dissection, PE, mass, AAA, electrolyte abnormality ? ?The patient has been appropriately medically screened and/or stabilized in the ED. I have low suspicion for any other emergent medical condition which would require further screening, evaluation or treatment in the ED or require inpatient management. ? ?Patient is hemodynamically stable and in no acute distress.  Patient able to ambulate in department prior to ED.  Evaluation does not show acute pathology that would require ongoing or additional  emergent interventions while in the emergency department or further inpatient treatment.  I have discussed the diagnosis with the patient and answered all questions.  Pain is been managed while in the emergency department and patient has no further complaints prior to discharge.  Patient is comfortable with plan discussed in room and is stable for discharge  at this time.  I have discussed strict return precautions for returning to the emergency department.  Patient was encouraged to follow-up with PCP/specialist refer to at discharge.  ? ? ?Medical Decision

## 2022-03-20 NOTE — ED Triage Notes (Signed)
Pt here with reports of going into afib last night and then again this morning. Pt also endorses heartburn as well as feeling like something was choking her. Pt takes xarelto.  ?

## 2022-03-20 NOTE — Discharge Planning (Signed)
RNCM consulted regarding pt needing rolling walker for home use.  RNCM spoke with pt and daughter at bedside to find that pt has rolling walker at home. ?

## 2022-03-20 NOTE — ED Notes (Signed)
Pt transported to MRI at this time 

## 2022-03-20 NOTE — ED Notes (Signed)
Reviewed discharge instructions with patient and husband. Follow-up care reviewed. Patient and husband verbalized understanding. Patient A&Ox4, VSS upon discharge.  ?

## 2022-03-20 NOTE — ED Provider Notes (Signed)
?[facility] ?Provider Note ? ? ?CSN: [id number] ?Arrival date & time: 03/20/22  7096 ? ?  ? ?History ? ?Chief Complaint  ?Patient presents with  ? Atrial Fibrillation  ? ? ?Julie Gill is a 81 y.o. female. ? ? ?Atrial Fibrillation ? ?81 year old female presents emergency department with history of 45-minute episode of heart palpitations beginning at 328 this morning as well as persistent instability/balance issues since awakening from her heart palpitations.  Her last known normal was 12:50 AM this morning.  Patient and daughter deny history of gait instability.  Patient states that her gait deviated to the left.  Daughter states that when she arrived to see her mother, she noted left-sided facial drooping as well as confirmed gait instability.  Patient feels as though she is going to fall whenever she stands up to walk which is very abnormal for her.  She denies fall, syncope, trauma to any area of her body.  She is on Xarelto for anticoagulation for A-fib recurrences.  ? ?Pertinent past medical history significant for atrial fibrillation, lipidemia, hypertension, mitral regurg, ? ?Home Medications ?Prior to Admission medications   ?Medication Sig Start Date End Date Taking? Authorizing Provider  ?acetaminophen (TYLENOL) 500 MG tablet Take 1,000 mg by mouth daily as needed (pain).   Yes [provider]  ?calcium carbonate (OS-CAL) 600 MG TABS Take 600 mg by mouth daily.   Yes [provider]  ?Cholecalciferol (VITAMIN D PO) Take 4,000 Units by mouth daily.     Yes [provider]  ?clonazePAM (KLONOPIN) 0.5 MG tablet Take 0.25 mg by mouth daily as needed for anxiety.   Yes [provider]  ?famotidine (PEPCID) 20 MG tablet Take 20 mg by mouth 2 (two) times daily.   Yes [provider]  ?lisinopril-hydrochlorothiazide (ZESTORETIC) 20-25 MG tablet Take 1 tablet by mouth daily. 06/17/21  Yes [provider]  ?lovastatin  (MEVACOR) 40 MG tablet Take 80 mg by mouth daily.  09/11/12  Yes [provider]  ?MAGNESIUM PO Take 250 mg by mouth daily.   Yes [provider]  ?melatonin 5 MG TABS Take 10 mg by mouth at bedtime.   Yes [provider]  ?nitroGLYCERIN (NITROSTAT) 0.4 MG SL tablet Place 1 tablet (0.4 mg total) under the tongue every 5 (five) minutes as needed for chest pain. 08/13/20  Yes Allred, Jeneen Rinks, MD  ?polyethylene glycol (MIRALAX / GLYCOLAX) 17 g packet Take 17 g by mouth daily.   Yes [provider]  ?potassium chloride SA (K-DUR,KLOR-CON) 20 MEQ tablet Take 20 mEq by mouth daily.   Yes [provider]  ?senna (SENOKOT) 8.6 MG tablet Take 1 tablet by mouth daily.   Yes [provider]  ?vitamin E 180 MG (400 UNITS) capsule Take 400 Units by mouth daily.    Yes [provider]  ?XARELTO 20 MG TABS tablet Take 1 tablet (20 mg total) by mouth daily with supper. 03/14/22  Yes Fenton, Clint R, PA  ?   ? ?Allergies    ?Codeine and Prednisone   ? ?Review of Systems   ?Review of Systems ? ?Physical Exam ?Updated Vital Signs ?BP 104/64   Pulse 63   Temp 98.5 ?F (36.9 ?C) (Oral)   Resp 20   Ht 5\' 1"  (1.549 m)   Wt 81.6 kg   SpO2 97%   BMI 34.01 kg/m?  ?Physical Exam ? ?ED Results / Procedures / Treatments   ?Labs ?(all labs ordered  are listed, but only abnormal results are displayed) ?Labs Reviewed  ?BASIC METABOLIC PANEL - Abnormal; Notable for the following components:  ?    Result Value  ? Glucose, Bld 125 (*)   ? BUN 28 (*)   ? Calcium 8.8 (*)   ? All other components within normal limits  ?CBC - Abnormal; Notable for the following components:  ? Hemoglobin 11.7 (*)   ? All other components within normal limits  ?TROPONIN I (HIGH SENSITIVITY) - Abnormal; Notable for the following components:  ? Troponin I (High Sensitivity) 47 (*)   ? All other components within normal limits  ?TROPONIN I (HIGH SENSITIVITY) - Abnormal; Notable for the following components:  ?  Troponin I (High Sensitivity) 43 (*)   ? All other components within normal limits  ?URINALYSIS, ROUTINE W REFLEX MICROSCOPIC  ? ? ?EKG ?EKG Interpretation ? ?Date/Time:  Thursday March 20 2022 09:40:25 EDT ?Ventricular Rate:  68 ?PR Interval:  136 ?QRS Duration: 82 ?QT Interval:  404 ?QTC Calculation: 429 ?R Axis:   11 ?Text Interpretation: Normal sinus rhythm Nonspecific T wave abnormality Abnormal ECG When compared with ECG of 26-Feb-2022 11:10, PREVIOUS ECG IS PRESENT no afib,? otherwise unremarkable EKG Confirmed by Noemi Chapel [phone]) on 03/20/2022 9:45:02 AM ? ?Radiology ?DG Chest 2 View ? ?Result Date: 03/20/2022 ?CLINICAL DATA:  Atrial fibrillation, dizziness and unsteadiness EXAM: CHEST - 2 VIEW COMPARISON:  February 05, 2017 FINDINGS: Mild cardiomegaly. Mediastinal contours are within normal limits. Small area of opacity at the left lung base near the CP angle likely atelectasis. Moderate thoracic spondylosis with prominent marginal osteophytes. IMPRESSION: Heart is borderline. Small area of likely atelectasis at the left lung base. Electronically Signed   By: Frazier Richards M.D.   On: 03/20/2022 10:18  ? ?MR BRAIN WO CONTRAST ? ?Result Date: 03/20/2022 ?CLINICAL DATA:  Neuro deficit, acute, stroke suspected EXAM: MRI HEAD WITHOUT CONTRAST TECHNIQUE: Multiplanar, multiecho pulse sequences of the brain and surrounding structures were obtained without intravenous contrast. COMPARISON:  None. FINDINGS: Brain: There is no acute infarction or intracranial hemorrhage. There is no intracranial mass, mass effect, or edema. There is no hydrocephalus or extra-axial fluid collection. Ventricles and sulci are within normal limits in size and configuration. Patchy T2 hyperintensity in the supratentorial and pontine white matter is nonspecific but may reflect mild chronic microvascular ischemic changes. Vascular: Major vessel flow voids at the skull base are preserved. Skull and upper cervical spine: Normal marrow signal is  preserved. Sinuses/Orbits: Paranasal sinuses are aerated. Bilateral lens replacements. Other: Sella is unremarkable.  Mastoid air cells are clear. IMPRESSION: No acute infarction, hemorrhage, or mass. Mild chronic microvascular ischemic changes. Electronically Signed   By: Macy Mis M.D.   On: 03/20/2022 14:04   ? ?Procedures ?Procedures  ? ? ?Medications Ordered in ED ?Medications  ?LORazepam (ATIVAN) injection 1 mg (1 mg Intravenous Given 03/20/22 1305)  ? ? ?ED Course/ Medical Decision Making/ A&P ?Clinical Course as of 03/20/22 1536  ?Thu Mar 20, 2022  ?1306 Troponin I (High Sensitivity)(!): 43 [CR]  ?1306 Second troponin slightly decreased [CR]  ?1510 Palpitation 45 min, intermittent, anticoag, weakness and gait issues. MR neg. Trop flat, FU on UA, plan to dc home after Urine. Walker to ambulate. PO challenge [BH]  ?  ?Clinical Course User Index ?[BH] Henderly, Britni A, PA-C ?[CR] Dion Saucier A, PA  ? ? ?                        ?  Medical Decision Making ?Amount and/or Complexity of Data Reviewed ?Labs: ordered. ?Radiology: ordered. ? ? ?This patient presents to the ED for concern of heart palpitations and gait instability, this involves an extensive number of treatment options, and is a complaint that carries with it a high risk of complications and morbidity.  The differential diagnosis includes atrial fibrillation, stroke/TIA, vestibular neuritis, Parkinson's, sepsis, ? ? ?Co morbidities that complicate the patient evaluation ? ?Obesity ?Hyperlipidemia  ?Atrial fibrillation ? ? ?Additional history obtained: ? ?Additional history obtained from office note 02/26/2022 ?External records from outside source obtained and reviewed including inflammatory atrial fibrillation with treatment of Xarelto ? ? ?Lab Tests: ? ?I Ordered, and personally interpreted labs.  The pertinent results include: Initial troponin 47 with second being 43. ? ? ?Imaging Studies ordered: ? ?I ordered imaging studies including 2 view  chest x-ray, MR brain ?I independently visualized and interpreted imaging which showed  ?Chest x-ray: Heart is borderline with left lower lobe likely atelectasis.  Patient does have a history of adenocarcinoma of th

## 2022-03-20 NOTE — Discharge Instructions (Addendum)
Keep a close eye on your symptoms. ?MRI did not show any stroke ?No atrial fibrillation here in the emergency department ? ?They have placed a referral to neurology ? ?Follow-up with primary care provider within the week ? ?Return for new or worsening symptoms ?

## 2022-03-25 DIAGNOSIS — I480 Paroxysmal atrial fibrillation: Secondary | ICD-10-CM | POA: Diagnosis not present

## 2022-03-25 DIAGNOSIS — G8929 Other chronic pain: Secondary | ICD-10-CM | POA: Diagnosis not present

## 2022-03-25 DIAGNOSIS — E782 Mixed hyperlipidemia: Secondary | ICD-10-CM | POA: Diagnosis not present

## 2022-03-25 DIAGNOSIS — I10 Essential (primary) hypertension: Secondary | ICD-10-CM | POA: Diagnosis not present

## 2022-03-25 DIAGNOSIS — K219 Gastro-esophageal reflux disease without esophagitis: Secondary | ICD-10-CM | POA: Diagnosis not present

## 2022-03-25 DIAGNOSIS — F324 Major depressive disorder, single episode, in partial remission: Secondary | ICD-10-CM | POA: Diagnosis not present

## 2022-03-28 ENCOUNTER — Ambulatory Visit
Admission: RE | Admit: 2022-03-28 | Discharge: 2022-03-28 | Disposition: A | Discharge status: Home / Self Care | Payer: Medicare HMO | Source: Ambulatory Visit | Attending: Family Medicine | Admitting: Family Medicine

## 2022-03-28 DIAGNOSIS — Z1231 Encounter for screening mammogram for malignant neoplasm of breast: Secondary | ICD-10-CM | POA: Diagnosis not present

## 2022-04-18 DIAGNOSIS — E782 Mixed hyperlipidemia: Secondary | ICD-10-CM | POA: Diagnosis not present

## 2022-04-18 DIAGNOSIS — Z79899 Other long term (current) drug therapy: Secondary | ICD-10-CM | POA: Diagnosis not present

## 2022-04-25 DIAGNOSIS — K219 Gastro-esophageal reflux disease without esophagitis: Secondary | ICD-10-CM | POA: Diagnosis not present

## 2022-04-25 DIAGNOSIS — M545 Low back pain, unspecified: Secondary | ICD-10-CM | POA: Diagnosis not present

## 2022-04-25 DIAGNOSIS — I480 Paroxysmal atrial fibrillation: Secondary | ICD-10-CM | POA: Diagnosis not present

## 2022-04-25 DIAGNOSIS — D6869 Other thrombophilia: Secondary | ICD-10-CM | POA: Diagnosis not present

## 2022-04-25 DIAGNOSIS — R7303 Prediabetes: Secondary | ICD-10-CM | POA: Diagnosis not present

## 2022-04-25 DIAGNOSIS — I10 Essential (primary) hypertension: Secondary | ICD-10-CM | POA: Diagnosis not present

## 2022-04-25 DIAGNOSIS — E782 Mixed hyperlipidemia: Secondary | ICD-10-CM | POA: Diagnosis not present

## 2022-04-25 DIAGNOSIS — F419 Anxiety disorder, unspecified: Secondary | ICD-10-CM | POA: Diagnosis not present

## 2022-04-25 DIAGNOSIS — G2581 Restless legs syndrome: Secondary | ICD-10-CM | POA: Diagnosis not present

## 2022-04-30 DIAGNOSIS — G8929 Other chronic pain: Secondary | ICD-10-CM | POA: Diagnosis not present

## 2022-04-30 DIAGNOSIS — I480 Paroxysmal atrial fibrillation: Secondary | ICD-10-CM | POA: Diagnosis not present

## 2022-04-30 DIAGNOSIS — I10 Essential (primary) hypertension: Secondary | ICD-10-CM | POA: Diagnosis not present

## 2022-04-30 DIAGNOSIS — E782 Mixed hyperlipidemia: Secondary | ICD-10-CM | POA: Diagnosis not present

## 2022-04-30 DIAGNOSIS — F324 Major depressive disorder, single episode, in partial remission: Secondary | ICD-10-CM | POA: Diagnosis not present

## 2022-04-30 DIAGNOSIS — K219 Gastro-esophageal reflux disease without esophagitis: Secondary | ICD-10-CM | POA: Diagnosis not present

## 2022-05-15 ENCOUNTER — Ambulatory Visit: Payer: Medicare HMO | Admitting: Cardiology

## 2022-05-15 ENCOUNTER — Encounter: Payer: Self-pay | Admitting: Cardiology

## 2022-05-15 VITALS — BP 124/60 | HR 67 | Ht 62.0 in | Wt 183.4 lb

## 2022-05-15 DIAGNOSIS — I480 Paroxysmal atrial fibrillation: Secondary | ICD-10-CM | POA: Diagnosis not present

## 2022-05-15 DIAGNOSIS — I10 Essential (primary) hypertension: Secondary | ICD-10-CM | POA: Diagnosis not present

## 2022-05-15 NOTE — Patient Instructions (Signed)
Medication Instructions:  Your physician recommends that you continue on your current medications as directed. Please refer to the Current Medication list given to you today. *If you need a refill on your cardiac medications before your next appointment, please call your pharmacy*  Lab Work: None. If you have labs (blood work) drawn today and your tests are completely normal, you will receive your results only by: [place] (if you have MyChart) OR A paper copy in the mail If you have any lab test that is abnormal or we need to change your treatment, we will call you to review the results.  Testing/Procedures: None.  Follow-Up: At [facility], you and your health needs are our priority.  As part of our continuing mission to provide you with exceptional heart care, we have created designated Provider Care Teams.  These Care Teams include your primary Cardiologist (physician) and Advanced Practice Providers (APPs -  Physician Assistants and Nurse Practitioners) who all work together to provide you with the care you need, when you need it.  Your physician wants you to follow-up in: 6 months with  one of the following Advanced Practice Providers on your designated Care Team:    Julie Gill, Julie Gill, Julie  12 months with Julie Mage, MD   We recommend signing up for the patient portal called "MyChart".  Sign up information is provided on this After Visit Summary.  MyChart is used to connect with patients for Virtual Visits (Telemedicine).  Patients are able to view lab/test results, encounter notes, upcoming appointments, etc.  Non-urgent messages can be sent to your provider as well.   To learn more about what you can do with MyChart, go to [email or web address].    Any Other Special Instructions Will Be Listed Below (If Applicable).

## 2022-05-15 NOTE — Progress Notes (Signed)
Electrophysiology Office Note:    Date:  05/15/2022   ID:  Julie Gill, DOB 06/04/1941, MRN [id number]  PCP:  [name], MD  [facility] Cardiologist:  None  [facility] Electrophysiologist:  Vickie Epley, MD   Referring MD: [name], MD   Chief Complaint: Follow-up  History of Present Illness:    Julie Gill is a 81 y.o. female who presents for follow-up. Their medical history includes atrial fibrillation s/p ablation 01/2012, mitral regurgitation, hypertension, hyperlipidemia, GERD, restless leg syndrome, sleep apnea, obesity.  Previously followed by Dr. Rayann Heman, last seen by him 02/27/2021 where she reported an isolated episode of palpitations lasting 45 minutes, but otherwise doing well. Her EKG showed sinus bradycardia 56 bpm, PR 128 msec, QRS 86 msec, QTc 405 msec. She was on Xarelto with a chads2vasc score of 4. As needed diltiazem was offered but she declined.  She was most recently seen in cardiology by Adline Peals, PA on 02/26/2022 where she was doing well. She denied tachypalpitations or bleeding issues. Her EKG showed NSR at 70 bpm, with PVC.  Today, she reports a recent episode of atrial fibrillation lasting 35-45 minutes. Afterwards, she noticed severe imbalance that prompted her daughter to take her to the ER. At the ED her Afib had resolved but she had the lingering imbalance. She attributes her Afib episode to being under significant stress due to a recent death in her family.  Recently she was started on amlodipine for her blood pressure.  She saw a dietician regarding her prediabetes. She continues to work on dietary changes.  She denies any chest pain, shortness of breath, or peripheral edema. No lightheadedness, headaches, syncope, orthopnea, or PND.     Past Medical History:  Diagnosis Date   Allergy    Angiodysplasia of cecum 12/23/2021   1-2 mm   Anxiety    Arthritis    Asthma    dx 10 yrs ago   Atrial fibrillation (HCC)     CHADS VASC score 3. Intolerant to flecainide. s/p afib ablation 02/12/12   Cataract    removed bilaterally   Colon polyps    Diastolic dysfunction    Dysrhythmia    a fib   dx 2014   GERD (gastroesophageal reflux disease)    History of kidney infection     last one was approx 2009   Hyperlipidemia    Hypertension    lung ca dx'd 08/2016   Mild sleep apnea    wears NO equipment   Mitral regurgitation    mild   Neuromuscular disorder (HCC)    Obesity    Plantar fasciitis    Restless leg syndrome    Sinus bradycardia    Sleep apnea    Stress bladder incontinence, female    WEARS SMALL PADS    Past Surgical History:  Procedure Laterality Date   ABDOMINAL HYSTERECTOMY  1992   BSO   APPENDECTOMY     atrial fibrillation ablation  02/12/12   PVI by Dr Rayann Heman   ATRIAL FIBRILLATION ABLATION N/A 02/12/2012   Procedure: ATRIAL FIBRILLATION ABLATION;  Surgeon: Thompson Grayer, MD;  Location: [facility] CATH LAB;  Service: Cardiovascular;  Laterality: N/A;   BLADDER SUSPENSION     CARPAL TUNNEL RELEASE Right 01/26/2020   Procedure: CARPAL TUNNEL RELEASE;  Surgeon: Daryll Brod, MD;  Location: [place];  Service: Orthopedics;  Laterality: Right;   [place], 200,2004, 2007, 05/06/2011   adenomas   heels spurs  x 2   TEE WITHOUT CARDIOVERSION  02/11/2012   Procedure: TRANSESOPHAGEAL ECHOCARDIOGRAM (TEE);  Surgeon: Larey Dresser, MD;  Location: [place];  Service: Cardiovascular;  Laterality: N/A;   TONSILLECTOMY AND ADENOIDECTOMY     VARICOSE VEIN SURGERY     VIDEO ASSISTED THORACOSCOPY (VATS)/WEDGE RESECTION Right 10/02/2016   Procedure: VIDEO ASSISTED THORACOSCOPY (VATS)/WEDGE RESECTION;  Surgeon: [name], MD;  Location: [place];  Service: Thoracic;  Laterality: Right;    Current Medications: Current Meds  Medication Sig   acetaminophen (TYLENOL) 500 MG tablet Take 1,000 mg by mouth daily as needed (pain).   amLODipine (NORVASC) 2.5 MG  tablet Take 2.5 mg by mouth daily.   calcium carbonate (OS-CAL) 600 MG TABS Take 600 mg by mouth daily.   Cholecalciferol (VITAMIN D PO) Take 4,000 Units by mouth daily.     clonazePAM (KLONOPIN) 0.5 MG tablet Take 0.25 mg by mouth daily as needed for anxiety.   famotidine (PEPCID) 20 MG tablet Take 20 mg by mouth 2 (two) times daily.   lisinopril-hydrochlorothiazide (ZESTORETIC) 20-25 MG tablet Take 1 tablet by mouth daily.   lovastatin (MEVACOR) 40 MG tablet Take 80 mg by mouth daily.    MAGNESIUM PO Take 250 mg by mouth daily.   melatonin 5 MG TABS Take 10 mg by mouth at bedtime.   nitroGLYCERIN (NITROSTAT) 0.4 MG SL tablet Place 1 tablet (0.4 mg total) under the tongue every 5 (five) minutes as needed for chest pain.   polyethylene glycol (MIRALAX / GLYCOLAX) 17 g packet Take 17 g by mouth daily.   potassium chloride SA (K-DUR,KLOR-CON) 20 MEQ tablet Take 20 mEq by mouth daily.   senna (SENOKOT) 8.6 MG tablet Take 1 tablet by mouth daily.   vitamin E 180 MG (400 UNITS) capsule Take 400 Units by mouth daily.    XARELTO 20 MG TABS tablet Take 1 tablet (20 mg total) by mouth daily with supper.     Allergies:   Codeine and Prednisone   Social History   Socioeconomic History   Marital status: Married    Spouse name: Not on file   Number of children: 2   Years of education: Not on file   Highest education level: Not on file  Occupational History   Occupation: retired  Tobacco Use   Smoking status: Former    Types: Cigarettes    Quit date: 09/17/1985    Years since quitting: 36.6   Smokeless tobacco: Never   Tobacco comments:    Former smoker 02/26/22  Vaping Use   Vaping Use: Never used  Substance and Sexual Activity   Alcohol use: No   Drug use: No   Sexual activity: Yes    Birth control/protection: Post-menopausal  Other Topics Concern   Not on file  Social History Narrative   Pt lives in [place] with spouse.  2 grown children.   Retired Multimedia programmer.   Social  Determinants of Health   Financial Resource Strain: Not on file  Food Insecurity: Not on file  Transportation Needs: Not on file  Physical Activity: Not on file  Stress: Not on file  Social Connections: Not on file     Family History: The patient's family history includes Colon cancer in her maternal grandmother and maternal uncle; Congestive Heart Failure in her mother; Diabetes in her brother; Hypertension in her brother; Lung cancer in her father. There is no history of Breast cancer, Esophageal cancer, Rectal cancer, or Stomach cancer.  ROS:   Please  see the history of present illness.    (+) Imbalance (+) Stress All other systems reviewed and are negative.  EKGs/Labs/Other Studies Reviewed:    The following studies were reviewed today:  10/14/2021  CT Chest: FINDINGS: Cardiovascular: Heart size is normal. No pericardial effusion. Aortic atherosclerosis and coronary artery calcifications.   Mediastinum/Nodes: Normal appearance of the thyroid gland. The trachea appears patent and is midline. Normal appearance of the esophagus. No enlarged supraclavicular, axillary, mediastinal, or hilar lymph nodes.   Lungs/Pleura: No pleural effusion, airspace consolidation, or pneumothorax. Postsurgical changes from right upper lobe wedge resection are again noted. Stable appearance of soft tissue thickening along the suture chain, image 33/7. Again noted are peribronchovascular nodularity, mild bronchiolectasis and volume loss within the right middle lobe and lingula. Favor postinflammatory change. Stable subpleural, solid nodule within the posteromedial left lower lobe measuring 4 mm, image 110/7. Solid nodule in the superior segment of the left lower lobe is also unchanged measuring 6 mm, image 75/7.   Scattered ground-glass attenuating nodules are again identified and appear unchanged from previous exam. The largest ground-glass nodule within the posterior right lower lobe  measures 1.2 cm, image 99/7.   Upper Abdomen: Lateral segment left hepatic lobe cyst is unchanged measuring 1.1 cm. Small hiatal hernia. Aortic atherosclerosis.   Musculoskeletal: Degenerative disc disease is noted within the thoracic spine. No acute or suspicious osseous findings.   IMPRESSION: 1. Stable appearance of the chest status post right upper lobe wedge resection. No specific findings identified to suggest residual or recurrence of tumor. 2. Unchanged appearance of peribronchovascular nodularity, mild bronchiolectasis and volume loss within the right middle lobe and lingula. Favor postinflammatory change. 3. Unchanged appearance of scattered ground-glass attenuating measuring up to 1.2 cm. Adenocarcinoma cannot be excluded. Continued attention on follow-up imaging is advised. 4. Small hiatal hernia. 5. Aortic Atherosclerosis (ICD10-I70.0).  04/02/2021  Echo:  1. Left ventricular ejection fraction, by estimation, is 60 to 65%. The  left ventricle has normal function. The left ventricle has no regional  wall motion abnormalities. Left ventricular diastolic parameters were  normal.   2. Right ventricular systolic function is normal. The right ventricular  size is normal. Tricuspid regurgitation signal is inadequate for assessing  PA pressure.   3. The mitral valve is degenerative. Trivial mitral valve regurgitation.  No evidence of mitral stenosis.   4. The aortic valve is tricuspid. There is mild calcification of the  aortic valve. Aortic valve regurgitation is not visualized. Mild aortic  valve sclerosis is present, with no evidence of aortic valve stenosis.   5. The inferior vena cava is normal in size with greater than 50%  respiratory variability, suggesting right atrial pressure of 3 mmHg.   Comparison(s): No significant change from prior study.   03/12/2021  Lexiscan Myoview: The left ventricular ejection fraction is hyperdynamic (>65%). Nuclear stress EF:  72%. There was no ST segment deviation noted during stress. No T wave inversion was noted during stress. There is a small apical septal defect which is likely artifact related RV insertion site. Normal wall motion in this region. The study is normal. This is a low risk study.   EKG:   EKG is personally reviewed.  05/15/2022: NSR.   Recent Labs: 10/14/2021: ALT 20 03/20/2022: BUN 28; Creatinine, Ser 0.93; Hemoglobin 11.7; Platelets 287; Potassium 4.2; Sodium 139   Recent Lipid Panel No results found for: "CHOL", "TRIG", "HDL", "CHOLHDL", "VLDL", "LDLCALC", "LDLDIRECT"  Physical Exam:    VS:  BP 124/60  Pulse 67   Ht 5\' 2"  (1.575 m)   Wt 183 lb 6.4 oz (83.2 kg)   SpO2 96%   BMI 33.54 kg/m     Wt Readings from Last 3 Encounters:  05/15/22 183 lb 6.4 oz (83.2 kg)  03/20/22 180 lb (81.6 kg)  02/26/22 182 lb (82.6 kg)     GEN: Well nourished, well developed in no acute distress HEENT: Normal NECK: No JVD; No carotid bruits LYMPHATICS: No lymphadenopathy CARDIAC: RRR, no murmurs, rubs, gallops RESPIRATORY:  Clear to auscultation without rales, wheezing or rhonchi  ABDOMEN: Soft, non-tender, non-distended MUSCULOSKELETAL:  No edema; No deformity  SKIN: Warm and dry NEUROLOGIC:  Alert and oriented x 3 PSYCHIATRIC:  Normal affect       ASSESSMENT:    1. Paroxysmal atrial fibrillation (HCC)   2. Essential hypertension    PLAN:    In order of problems listed above:   #Paroxysmal atrial fibrillation Doing well after her ablation in 2013.  Only 1 recent brief episode of atrial fibrillation lasting less than 1 hour.  Continues to take Xarelto for stroke prophylaxis.  For now, no changes in medical therapy.  I will have her touch base with an APP in 6 months and me in 1 year.  #Hypertension Controlled during today's visit.  Continue checking blood pressures 1-2 times per week at home and bring these values to your primary care physician's office.   Follow-up with APP  in 6 months. Follow-up with me in 1 year.   Medication Adjustments/Labs and Tests Ordered: Current medicines are reviewed at length with the patient today.  Concerns regarding medicines are outlined above.  Orders Placed This Encounter  Procedures   EKG 12-Lead   No orders of the defined types were placed in this encounter.   I,Mathew Stumpf,acting as a Education administrator for Vickie Epley, MD.,have documented all relevant documentation on the behalf of Vickie Epley, MD,as directed by  Vickie Epley, MD while in the presence of Vickie Epley, MD.  I, Vickie Epley, MD, have reviewed all documentation for this visit. The documentation on 05/15/22 for the exam, diagnosis, procedures, and orders are all accurate and complete.   Signed, Hilton Cork. Quentin Ore, MD, [facility], [facility] 05/15/2022 10:43 PM    Electrophysiology [facility]

## 2022-06-18 DIAGNOSIS — I480 Paroxysmal atrial fibrillation: Secondary | ICD-10-CM | POA: Diagnosis not present

## 2022-06-18 DIAGNOSIS — E782 Mixed hyperlipidemia: Secondary | ICD-10-CM | POA: Diagnosis not present

## 2022-06-18 DIAGNOSIS — K219 Gastro-esophageal reflux disease without esophagitis: Secondary | ICD-10-CM | POA: Diagnosis not present

## 2022-06-18 DIAGNOSIS — R7303 Prediabetes: Secondary | ICD-10-CM | POA: Diagnosis not present

## 2022-06-18 DIAGNOSIS — I10 Essential (primary) hypertension: Secondary | ICD-10-CM | POA: Diagnosis not present

## 2022-06-23 DIAGNOSIS — L820 Inflamed seborrheic keratosis: Secondary | ICD-10-CM | POA: Diagnosis not present

## 2022-06-23 DIAGNOSIS — B0089 Other herpesviral infection: Secondary | ICD-10-CM | POA: Diagnosis not present

## 2022-07-04 DIAGNOSIS — M7062 Trochanteric bursitis, left hip: Secondary | ICD-10-CM | POA: Diagnosis not present

## 2022-08-20 ENCOUNTER — Other Ambulatory Visit: Payer: Self-pay

## 2022-08-20 ENCOUNTER — Encounter (HOSPITAL_BASED_OUTPATIENT_CLINIC_OR_DEPARTMENT_OTHER): Payer: Self-pay

## 2022-08-20 ENCOUNTER — Emergency Department (HOSPITAL_BASED_OUTPATIENT_CLINIC_OR_DEPARTMENT_OTHER)
Admission: EM | Admit: 2022-08-20 | Discharge: 2022-08-20 | Disposition: A | Discharge status: Home / Self Care | Payer: Medicare HMO | Attending: Emergency Medicine | Admitting: Emergency Medicine

## 2022-08-20 ENCOUNTER — Emergency Department (HOSPITAL_BASED_OUTPATIENT_CLINIC_OR_DEPARTMENT_OTHER): Payer: Medicare HMO

## 2022-08-20 DIAGNOSIS — Z7901 Long term (current) use of anticoagulants: Secondary | ICD-10-CM | POA: Diagnosis not present

## 2022-08-20 DIAGNOSIS — J101 Influenza due to other identified influenza virus with other respiratory manifestations: Secondary | ICD-10-CM | POA: Diagnosis not present

## 2022-08-20 DIAGNOSIS — Z79899 Other long term (current) drug therapy: Secondary | ICD-10-CM | POA: Diagnosis not present

## 2022-08-20 DIAGNOSIS — J111 Influenza due to unidentified influenza virus with other respiratory manifestations: Secondary | ICD-10-CM | POA: Insufficient documentation

## 2022-08-20 DIAGNOSIS — R059 Cough, unspecified: Secondary | ICD-10-CM | POA: Diagnosis not present

## 2022-08-20 DIAGNOSIS — I10 Essential (primary) hypertension: Secondary | ICD-10-CM | POA: Diagnosis not present

## 2022-08-20 DIAGNOSIS — I4891 Unspecified atrial fibrillation: Secondary | ICD-10-CM | POA: Diagnosis not present

## 2022-08-20 DIAGNOSIS — Z20822 Contact with and (suspected) exposure to covid-19: Secondary | ICD-10-CM | POA: Insufficient documentation

## 2022-08-20 DIAGNOSIS — U071 COVID-19: Secondary | ICD-10-CM | POA: Diagnosis not present

## 2022-08-20 LAB — COMPREHENSIVE METABOLIC PANEL
ALT: 16 U/L (ref 0–44)
AST: 25 U/L (ref 15–41)
Albumin: 3.9 g/dL (ref 3.5–5.0)
Alkaline Phosphatase: 65 U/L (ref 38–126)
Anion gap: 11 (ref 5–15)
BUN: 16 mg/dL (ref 8–23)
CO2: 24 mmol/L (ref 22–32)
Calcium: 9 mg/dL (ref 8.9–10.3)
Chloride: 107 mmol/L (ref 98–111)
Creatinine, Ser: 0.8 mg/dL (ref 0.44–1.00)
GFR, Estimated: >60 mL/min (ref >60)
Glucose, Bld: 99 mg/dL (ref 70–99)
Potassium: 4.6 mmol/L (ref 3.5–5.1)
Sodium: 142 mmol/L (ref 135–145)
Total Bilirubin: 0.3 mg/dL (ref 0.3–1.2)
Total Protein: 6.3 g/dL — ABNORMAL LOW (ref 6.5–8.1)

## 2022-08-20 LAB — CBC WITH DIFFERENTIAL/PLATELET
Abs Immature Granulocytes: 0.04 10*3/uL (ref 0.00–0.07)
Basophils Absolute: 0 10*3/uL (ref 0.0–0.1)
Basophils Relative: 0 %
Eosinophils Absolute: 0.1 10*3/uL (ref 0.0–0.5)
Eosinophils Relative: 1 %
HCT: 34.2 % — ABNORMAL LOW (ref 36.0–46.0)
Hemoglobin: 10.9 g/dL — ABNORMAL LOW (ref 12.0–15.0)
Immature Granulocytes: 1 %
Lymphocytes Relative: 29 %
Lymphs Abs: 1.3 10*3/uL (ref 0.7–4.0)
MCH: 29.2 pg (ref 26.0–34.0)
MCHC: 31.9 g/dL (ref 30.0–36.0)
MCV: 91.7 fL (ref 80.0–100.0)
Monocytes Absolute: 0.3 10*3/uL (ref 0.1–1.0)
Monocytes Relative: 7 %
Neutro Abs: 2.8 10*3/uL (ref 1.7–7.7)
Neutrophils Relative %: 62 %
Platelets: 257 10*3/uL (ref 150–400)
RBC: 3.73 MIL/uL — ABNORMAL LOW (ref 3.87–5.11)
RDW: 14.3 % (ref 11.5–15.5)
WBC: 4.4 10*3/uL (ref 4.0–10.5)
nRBC: 0 % (ref 0.0–0.2)

## 2022-08-20 LAB — RESP PANEL BY RT-PCR (FLU A&B, COVID) ARPGX2
Influenza A by PCR: POSITIVE — AB
Influenza B by PCR: NEGATIVE
SARS Coronavirus 2 by RT PCR: NEGATIVE

## 2022-08-20 LAB — TROPONIN I (HIGH SENSITIVITY)
Troponin I (High Sensitivity): 5 ng/L (ref <18)
Troponin I (High Sensitivity): 7 ng/L (ref <18)

## 2022-08-20 NOTE — Discharge Instructions (Signed)
Your flu test is positive.  Keep yourself hydrated.  Use Tylenol as needed for fever and aches.  Follow-up with your doctor.  Return to the ED with difficulty breathing, chest pain, not able to eat or drink, not acting like yourself or other concerns.

## 2022-08-20 NOTE — ED Notes (Signed)
Reviewed AVS/discharge instruction with patient. Time allotted for and all questions answered. Patient is agreeable for d/c and escorted to ed exit by staff.  

## 2022-08-20 NOTE — ED Provider Notes (Signed)
[facility] EMERGENCY DEPT Provider Note   CSN: [id number] Arrival date & time: 08/20/22  1457     History {Add pertinent medical, surgical, social history, OB history to HPI:1} Chief Complaint  Patient presents with   Cough    Julie Gill is a 81 y.o. female.  Patient with a history of atrial fibrillation on Eliquis, hypertension, hyperlipidemia presenting with a 7-day history of head congestion, cough, sore throat, chest tightness with coughing, shortness of breath and diarrhea.  She is concerned for COVID exposure.  She is here with her husband who is also sick.  They returned from a cruise yesterday after leaving on September 9.  States multiple people tested positive for COVID on a cruise.  She has not taken a test herself. Has had body aches, chills, cough, headache, diarrhea up to 4-5 times daily that is nonbloody.  No vomiting.  Chest pain only with coughing.  Has had runny nose, congestion, sore throat with cough and headache and body aches.   Cough Associated symptoms: fever, myalgias and rhinorrhea   Associated symptoms: no chest pain        Home Medications Prior to Admission medications   Medication Sig Start Date End Date Taking? Authorizing Provider  acetaminophen (TYLENOL) 500 MG tablet Take 1,000 mg by mouth daily as needed (pain).    [provider]  amLODipine (NORVASC) 2.5 MG tablet Take 2.5 mg by mouth daily. 05/01/22   [provider]  calcium carbonate (OS-CAL) 600 MG TABS Take 600 mg by mouth daily.    [provider]  Cholecalciferol (VITAMIN D PO) Take 4,000 Units by mouth daily.      [provider]  clonazePAM (KLONOPIN) 0.5 MG tablet Take 0.25 mg by mouth daily as needed for anxiety.    [provider]  famotidine (PEPCID) 20 MG tablet Take 20 mg by mouth 2 (two) times daily.    [provider]  lisinopril-hydrochlorothiazide (ZESTORETIC) 20-25 MG tablet Take 1 tablet by mouth daily.  06/17/21   [provider]  lovastatin (MEVACOR) 40 MG tablet Take 80 mg by mouth daily.  09/11/12   [provider]  MAGNESIUM PO Take 250 mg by mouth daily.    [provider]  melatonin 5 MG TABS Take 10 mg by mouth at bedtime.    [provider]  nitroGLYCERIN (NITROSTAT) 0.4 MG SL tablet Place 1 tablet (0.4 mg total) under the tongue every 5 (five) minutes as needed for chest pain. 08/13/20   Allred, Jeneen Rinks, MD  polyethylene glycol (MIRALAX / GLYCOLAX) 17 g packet Take 17 g by mouth daily.    [provider]  potassium chloride SA (K-DUR,KLOR-CON) 20 MEQ tablet Take 20 mEq by mouth daily.    [provider]  senna (SENOKOT) 8.6 MG tablet Take 1 tablet by mouth daily.    [provider]  vitamin E 180 MG (400 UNITS) capsule Take 400 Units by mouth daily.     [provider]  XARELTO 20 MG TABS tablet Take 1 tablet (20 mg total) by mouth daily with supper. 03/14/22   Fenton, Clint R, PA      Allergies    Codeine and Prednisone    Review of Systems   Review of Systems  Constitutional:  Positive for activity change, appetite change and fever.  HENT:  Positive for congestion and rhinorrhea.   Respiratory:  Positive for cough.   Cardiovascular:  Negative for chest pain.  Gastrointestinal:  Positive  for diarrhea. Negative for abdominal pain, nausea and vomiting.  Genitourinary:  Negative for dysuria and hematuria.  Musculoskeletal:  Positive for arthralgias and myalgias.  Skin:  Negative for wound.  Neurological:  Positive for weakness.   all other systems are negative except as noted in the HPI and PMH.    Physical Exam Updated Vital Signs BP (!) 174/75 (BP Location: Right Arm)   Pulse 83   Temp 97.9 F (36.6 C)   Resp 18   Ht 5\' 2"  (1.575 m)   Wt 83.2 kg   SpO2 98%   BMI 33.55 kg/m  Physical Exam Vitals and nursing note reviewed.  Constitutional:      General: She is not in acute distress.    Appearance:  She is well-developed.     Comments: Speaking in full sentences, no distress  HENT:     Head: Normocephalic and atraumatic.     Right Ear: Tympanic membrane normal.     Left Ear: Tympanic membrane normal.     Nose: Congestion present.     Mouth/Throat:     Mouth: Mucous membranes are moist.     Pharynx: No oropharyngeal exudate or posterior oropharyngeal erythema.     Comments: Uvula midline, no asymmetry or exudates Eyes:     Conjunctiva/sclera: Conjunctivae normal.     Pupils: Pupils are equal, round, and reactive to light.  Neck:     Comments: No meningismus. Cardiovascular:     Rate and Rhythm: Normal rate. Rhythm irregular.     Heart sounds: Normal heart sounds. No murmur heard. Pulmonary:     Effort: Pulmonary effort is normal. No respiratory distress.     Breath sounds: Normal breath sounds.  Chest:     Chest wall: No tenderness.  Abdominal:     Palpations: Abdomen is soft.     Tenderness: There is no abdominal tenderness. There is no guarding or rebound.  Musculoskeletal:        General: No tenderness. Normal range of motion.     Cervical back: Normal range of motion and neck supple.  Skin:    General: Skin is warm.  Neurological:     Mental Status: She is alert and oriented to person, place, and time.     Cranial Nerves: No cranial nerve deficit.     Motor: No abnormal muscle tone.     Coordination: Coordination normal.     Comments:  5/5 strength throughout. CN 2-12 intact.Equal grip strength.   Psychiatric:        Behavior: Behavior normal.     ED Results / Procedures / Treatments   Labs (all labs ordered are listed, but only abnormal results are displayed) Labs Reviewed  RESP PANEL BY RT-PCR (FLU A&B, COVID) ARPGX2  CBC WITH DIFFERENTIAL/PLATELET  COMPREHENSIVE METABOLIC PANEL  TROPONIN I (HIGH SENSITIVITY)    EKG None  Radiology No results found.  Procedures Procedures  {Document cardiac monitor, telemetry assessment procedure when  appropriate:1}  Medications Ordered in ED Medications - No data to display  ED Course/ Medical Decision Making/ A&P                           Medical Decision Making Amount and/or Complexity of Data Reviewed Labs: ordered. Decision-making details documented in ED Course. Radiology: ordered and independent interpretation performed. Decision-making details documented in ED Course. ECG/medicine tests: ordered and independent interpretation performed. Decision-making details documented in ED Course.  7 days of URI symptoms,  cough, congestion, fever, aches and diarrhea.  Multiple COVID exposures on recent cruise.  No hypoxia or increased work of breathing. Chest x-ray is negative for edema or infiltrate.  Labs are showing a stable anemia.  Troponin negative x2.  Low suspicion for ACS.  EKG without acute ischemia.  No hypoxia or tachycardia. Low suspicion for pulmonary embolism.  Patient does take Xarelto and states compliance.  COVID test is negative.  Influenza test is however positive.  This likely explains her symptoms.  She is able to tolerate p.o. and ambulate without desaturation.  Out of Tamiflu window.  Discussed p.o. hydration at home and antipyretics.  PCP follow-up discussed. {Document critical care time when appropriate:1} {Document review of labs and clinical decision tools ie heart score, Chads2Vasc2 etc:1}  {Document your independent review of radiology images, and any outside records:1} {Document your discussion with family members, caretakers, and with consultants:1} {Document social determinants of health affecting pt's care:1} {Document your decision making why or why not admission, treatments were needed:1} Final Clinical Impression(s) / ED Diagnoses Final diagnoses:  None    Rx / DC Orders ED Discharge Orders     None

## 2022-08-20 NOTE — ED Notes (Signed)
Patient ambulating in room, simulating normal daily activities of house work and self care, HR maintained 65-78 bpm spo2 94-98% on room air. Denies any SOB during activity. Did report some fatigue after activity.

## 2022-08-20 NOTE — ED Notes (Signed)
Water, cola and peanut butter crackers at bedside for patient for PO challenge.

## 2022-08-20 NOTE — ED Triage Notes (Signed)
Patient here POV from Home.  Endorses Flu-Like Symptoms such as Cough, Fevers, Chills, Headaches, Diarrhea for approximately 10 Days.    Recent Travel and Close-Contact with COVID-19 Positive Individual.  NAD Noted during Triage. A&Ox4. GCS 15. Ambulatory.

## 2022-08-20 NOTE — ED Notes (Signed)
Patient tolerating PO intake without c/o n/v.

## 2022-08-28 DIAGNOSIS — F324 Major depressive disorder, single episode, in partial remission: Secondary | ICD-10-CM | POA: Diagnosis not present

## 2022-08-28 DIAGNOSIS — I480 Paroxysmal atrial fibrillation: Secondary | ICD-10-CM | POA: Diagnosis not present

## 2022-08-28 DIAGNOSIS — K219 Gastro-esophageal reflux disease without esophagitis: Secondary | ICD-10-CM | POA: Diagnosis not present

## 2022-08-28 DIAGNOSIS — I10 Essential (primary) hypertension: Secondary | ICD-10-CM | POA: Diagnosis not present

## 2022-08-28 DIAGNOSIS — E782 Mixed hyperlipidemia: Secondary | ICD-10-CM | POA: Diagnosis not present

## 2022-08-29 DIAGNOSIS — R509 Fever, unspecified: Secondary | ICD-10-CM | POA: Diagnosis not present

## 2022-08-29 DIAGNOSIS — R051 Acute cough: Secondary | ICD-10-CM | POA: Diagnosis not present

## 2022-08-29 DIAGNOSIS — Z03818 Encounter for observation for suspected exposure to other biological agents ruled out: Secondary | ICD-10-CM | POA: Diagnosis not present

## 2022-08-29 DIAGNOSIS — J069 Acute upper respiratory infection, unspecified: Secondary | ICD-10-CM | POA: Diagnosis not present

## 2022-08-29 DIAGNOSIS — J029 Acute pharyngitis, unspecified: Secondary | ICD-10-CM | POA: Diagnosis not present

## 2022-09-01 ENCOUNTER — Telehealth: Payer: Self-pay | Admitting: *Deleted

## 2022-09-01 NOTE — Telephone Encounter (Signed)
     Patient  visit on 08/20/2022  at [facility] ed was for flu  Have you been able to follow up  ? patient went to [facility] and she was cleared as recovering , still coughing but is much better has seen an urgent care physician  The patient was or was not able to obtain any needed medicine or equipment.  Are there diet recommendations that you are having difficulty following?  Patient expresses understanding of discharge instructions and education provided has no other needs at this time.   [facility] [phone] [address] , [place] [address] Email : [email or web address]

## 2022-09-26 DIAGNOSIS — I10 Essential (primary) hypertension: Secondary | ICD-10-CM | POA: Diagnosis not present

## 2022-09-26 DIAGNOSIS — F324 Major depressive disorder, single episode, in partial remission: Secondary | ICD-10-CM | POA: Diagnosis not present

## 2022-09-26 DIAGNOSIS — E782 Mixed hyperlipidemia: Secondary | ICD-10-CM | POA: Diagnosis not present

## 2022-09-26 DIAGNOSIS — D508 Other iron deficiency anemias: Secondary | ICD-10-CM | POA: Diagnosis not present

## 2022-09-26 DIAGNOSIS — K219 Gastro-esophageal reflux disease without esophagitis: Secondary | ICD-10-CM | POA: Diagnosis not present

## 2022-09-26 DIAGNOSIS — I480 Paroxysmal atrial fibrillation: Secondary | ICD-10-CM | POA: Diagnosis not present

## 2022-10-10 ENCOUNTER — Ambulatory Visit (HOSPITAL_COMMUNITY)
Admission: RE | Admit: 2022-10-10 | Discharge: 2022-10-10 | Disposition: A | Discharge status: Home / Self Care | Payer: Medicare HMO | Source: Ambulatory Visit | Attending: Internal Medicine | Admitting: Internal Medicine

## 2022-10-10 ENCOUNTER — Inpatient Hospital Stay: Payer: Medicare HMO | Attending: Internal Medicine

## 2022-10-10 ENCOUNTER — Other Ambulatory Visit: Payer: Self-pay

## 2022-10-10 DIAGNOSIS — R918 Other nonspecific abnormal finding of lung field: Secondary | ICD-10-CM | POA: Diagnosis not present

## 2022-10-10 DIAGNOSIS — J479 Bronchiectasis, uncomplicated: Secondary | ICD-10-CM | POA: Diagnosis not present

## 2022-10-10 DIAGNOSIS — Z90710 Acquired absence of both cervix and uterus: Secondary | ICD-10-CM | POA: Insufficient documentation

## 2022-10-10 DIAGNOSIS — C3490 Malignant neoplasm of unspecified part of unspecified bronchus or lung: Secondary | ICD-10-CM

## 2022-10-10 DIAGNOSIS — C3411 Malignant neoplasm of upper lobe, right bronchus or lung: Secondary | ICD-10-CM | POA: Insufficient documentation

## 2022-10-10 DIAGNOSIS — I10 Essential (primary) hypertension: Secondary | ICD-10-CM | POA: Insufficient documentation

## 2022-10-10 DIAGNOSIS — Z902 Acquired absence of lung [part of]: Secondary | ICD-10-CM | POA: Insufficient documentation

## 2022-10-10 LAB — CBC WITH DIFFERENTIAL (CANCER CENTER ONLY)
Abs Immature Granulocytes: 0.03 10*3/uL (ref 0.00–0.07)
Basophils Absolute: 0 10*3/uL (ref 0.0–0.1)
Basophils Relative: 0 %
Eosinophils Absolute: 0.1 10*3/uL (ref 0.0–0.5)
Eosinophils Relative: 2 %
HCT: 34.2 % — ABNORMAL LOW (ref 36.0–46.0)
Hemoglobin: 10.9 g/dL — ABNORMAL LOW (ref 12.0–15.0)
Immature Granulocytes: 1 %
Lymphocytes Relative: 31 %
Lymphs Abs: 1.9 10*3/uL (ref 0.7–4.0)
MCH: 29.2 pg (ref 26.0–34.0)
MCHC: 31.9 g/dL (ref 30.0–36.0)
MCV: 91.7 fL (ref 80.0–100.0)
Monocytes Absolute: 0.4 10*3/uL (ref 0.1–1.0)
Monocytes Relative: 7 %
Neutro Abs: 3.6 10*3/uL (ref 1.7–7.7)
Neutrophils Relative %: 59 %
Platelet Count: 256 10*3/uL (ref 150–400)
RBC: 3.73 MIL/uL — ABNORMAL LOW (ref 3.87–5.11)
RDW: 14.3 % (ref 11.5–15.5)
WBC Count: 6.1 10*3/uL (ref 4.0–10.5)
nRBC: 0 % (ref 0.0–0.2)

## 2022-10-10 LAB — CMP (CANCER CENTER ONLY)
ALT: 18 U/L (ref 0–44)
AST: 20 U/L (ref 15–41)
Albumin: 4 g/dL (ref 3.5–5.0)
Alkaline Phosphatase: 83 U/L (ref 38–126)
Anion gap: 3 — ABNORMAL LOW (ref 5–15)
BUN: 18 mg/dL (ref 8–23)
CO2: 31 mmol/L (ref 22–32)
Calcium: 9.5 mg/dL (ref 8.9–10.3)
Chloride: 108 mmol/L (ref 98–111)
Creatinine: 0.86 mg/dL (ref 0.44–1.00)
GFR, Estimated: >60 mL/min (ref >60)
Glucose, Bld: 92 mg/dL (ref 70–99)
Potassium: 4.6 mmol/L (ref 3.5–5.1)
Sodium: 142 mmol/L (ref 135–145)
Total Bilirubin: 0.3 mg/dL (ref 0.3–1.2)
Total Protein: 6.7 g/dL (ref 6.5–8.1)

## 2022-10-10 MED ORDER — IOHEXOL 300 MG/ML  SOLN
75.0000 mL | Freq: Once | INTRAMUSCULAR | Status: AC | PRN
  Administered 2022-10-10: 75 mL via INTRAVENOUS

## 2022-10-10 MED ORDER — SODIUM CHLORIDE (PF) 0.9 % IJ SOLN
INTRAMUSCULAR | Status: AC
  Filled 2022-10-10: qty 50

## 2022-10-14 ENCOUNTER — Inpatient Hospital Stay: Payer: Medicare HMO | Admitting: Internal Medicine

## 2022-10-14 ENCOUNTER — Other Ambulatory Visit: Payer: Self-pay

## 2022-10-14 VITALS — BP 150/57 | HR 71 | Temp 98.1°F | Resp 18 | Wt 185.3 lb

## 2022-10-14 DIAGNOSIS — Z902 Acquired absence of lung [part of]: Secondary | ICD-10-CM | POA: Diagnosis not present

## 2022-10-14 DIAGNOSIS — C3490 Malignant neoplasm of unspecified part of unspecified bronchus or lung: Secondary | ICD-10-CM

## 2022-10-14 DIAGNOSIS — Z90710 Acquired absence of both cervix and uterus: Secondary | ICD-10-CM | POA: Diagnosis not present

## 2022-10-14 DIAGNOSIS — I10 Essential (primary) hypertension: Secondary | ICD-10-CM | POA: Diagnosis not present

## 2022-10-14 DIAGNOSIS — C3411 Malignant neoplasm of upper lobe, right bronchus or lung: Secondary | ICD-10-CM | POA: Diagnosis not present

## 2022-10-14 NOTE — Progress Notes (Signed)
[facility] [place] Telephone:([phone]   [phone]  OFFICE PROGRESS NOTE  Julie Gill, Julie Gill [id number]  DIAGNOSIS: Stage IB (T1c, N0, M0) non-small cell lung cancer, adenocarcinoma but the patient also has multifocal groundglass opacities in the lung bilaterally highly concerning for low-grade adenocarcinoma as well diagnosed in November 2017.  Genomic Alteration Identified? KRAS G12V Additional Findings? Microsatellite status MS-Stable Tumor Mutation Burden TMB-Intermediate; 11 Muts/Mb Additional Disease-relevant Genes with No Reportable Alterations Identified? EGFR ALK BRAF MET RET ERBB2 ROS1   PDL1 expression 5%.  PRIOR THERAPY: Status post wedge resection of the right upper lobe on 10/02/2016 under the care of Dr. Roxan Hockey.  CURRENT THERAPY: Observation.  INTERVAL HISTORY: Julie Gill 81 y.o. female returns to the clinic today for annual follow-up visit accompanied by her husband.  The patient is feeling fine today with no concerning complaints.  She denied having any chest pain, shortness of breath, cough or hemoptysis.  She has no nausea, vomiting, diarrhea or constipation.  She denied having any headache or visual changes.  She has no recent weight loss or night sweats.  She is here today for evaluation with repeat CT scan of the chest for restaging of her disease.   MEDICAL HISTORY: Past Medical History:  Diagnosis Date   Allergy    Angiodysplasia of cecum 12/23/2021   1-2 mm   Anxiety    Arthritis    Asthma    dx 10 yrs ago   Atrial fibrillation (HCC)    CHADS VASC score 3. Intolerant to flecainide. s/p afib ablation 02/12/12   Cataract    removed bilaterally   Colon polyps    Diastolic dysfunction    Dysrhythmia    a fib   dx 2014   GERD (gastroesophageal reflux disease)    History of kidney infection     last one was approx 2009   Hyperlipidemia    Hypertension    lung ca dx'd 08/2016    Mild sleep apnea    wears NO equipment   Mitral regurgitation    mild   Neuromuscular disorder (HCC)    Obesity    Plantar fasciitis    Restless leg syndrome    Sinus bradycardia    Sleep apnea    Stress bladder incontinence, female    WEARS SMALL PADS    ALLERGIES:  is allergic to codeine and prednisone.  MEDICATIONS:  Current Outpatient Medications  Medication Sig Dispense Refill   acetaminophen (TYLENOL) 500 MG tablet Take 1,000 mg by mouth daily as needed (pain).     amLODipine (NORVASC) 2.5 MG tablet Take 2.5 mg by mouth daily.     calcium carbonate (OS-CAL) 600 MG TABS Take 600 mg by mouth daily.     Cholecalciferol (VITAMIN D PO) Take 4,000 Units by mouth daily.       clonazePAM (KLONOPIN) 0.5 MG tablet Take 0.25 mg by mouth daily as needed for anxiety.     famotidine (PEPCID) 20 MG tablet Take 20 mg by mouth 2 (two) times daily.     lisinopril-hydrochlorothiazide (ZESTORETIC) 20-25 MG tablet Take 1 tablet by mouth daily.     lovastatin (MEVACOR) 40 MG tablet Take 80 mg by mouth daily.      MAGNESIUM PO Take 250 mg by mouth daily.     melatonin 5 MG TABS Take 10 mg by mouth at bedtime.     polyethylene glycol (MIRALAX / GLYCOLAX) 17 g packet  Take 17 g by mouth daily.     potassium chloride SA (K-DUR,KLOR-CON) 20 MEQ tablet Take 20 mEq by mouth daily.     senna (SENOKOT) 8.6 MG tablet Take 1 tablet by mouth daily.     vitamin E 180 MG (400 UNITS) capsule Take 400 Units by mouth daily.      XARELTO 20 MG TABS tablet Take 1 tablet (20 mg total) by mouth daily with supper. 14 tablet 0   nitroGLYCERIN (NITROSTAT) 0.4 MG SL tablet Place 1 tablet (0.4 mg total) under the tongue every 5 (five) minutes as needed for chest pain. (Patient not taking: Reported on 10/14/2022) 25 tablet 1   No current facility-administered medications for this visit.    SURGICAL HISTORY:  Past Surgical History:  Procedure Laterality Date   ABDOMINAL HYSTERECTOMY  1992   BSO   APPENDECTOMY      atrial fibrillation ablation  02/12/12   PVI by Dr Rayann Heman   ATRIAL FIBRILLATION ABLATION N/A 02/12/2012   Procedure: ATRIAL FIBRILLATION ABLATION;  Surgeon: Thompson Grayer, MD;  Location: [facility] CATH LAB;  Service: Cardiovascular;  Laterality: N/A;   BLADDER SUSPENSION     CARPAL TUNNEL RELEASE Right 01/26/2020   Procedure: CARPAL TUNNEL RELEASE;  Surgeon: Daryll Brod, MD;  Location: [facility];  Service: Orthopedics;  Laterality: Right;   [place], 200,2004, 2007, 05/06/2011   adenomas   heels spurs     x 2   TEE WITHOUT CARDIOVERSION  02/11/2012   Procedure: TRANSESOPHAGEAL ECHOCARDIOGRAM (TEE);  Surgeon: Larey Dresser, MD;  Location: [facility];  Service: Cardiovascular;  Laterality: N/A;   TONSILLECTOMY AND ADENOIDECTOMY     VARICOSE VEIN SURGERY     VIDEO ASSISTED THORACOSCOPY (VATS)/WEDGE RESECTION Right 10/02/2016   Procedure: VIDEO ASSISTED THORACOSCOPY (VATS)/WEDGE RESECTION;  Surgeon: [name], MD;  Location: [facility];  Service: Thoracic;  Laterality: Right;    REVIEW OF SYSTEMS:  A comprehensive review of systems was negative.   PHYSICAL EXAMINATION: General appearance: alert, cooperative, and no distress Head: Normocephalic, without obvious abnormality, atraumatic Neck: no adenopathy, no JVD, supple, symmetrical, trachea midline, and thyroid not enlarged, symmetric, no tenderness/mass/nodules Lymph nodes: Cervical, supraclavicular, and axillary nodes normal. Resp: clear to auscultation bilaterally Back: symmetric, no curvature. ROM normal. No CVA tenderness. Cardio: regular rate and rhythm, S1, S2 normal, no murmur, click, rub or gallop GI: soft, non-tender; bowel sounds normal; no masses,  no organomegaly Extremities: extremities normal, atraumatic, no cyanosis or edema  ECOG PERFORMANCE STATUS: 1 - Symptomatic but completely ambulatory  Blood pressure (!) 150/57, pulse 71, temperature 98.1 F (36.7 C), temperature source Oral,  resp. rate 18, weight 185 lb 5 oz (84.1 kg), SpO2 98 %.  LABORATORY DATA: Lab Results  Component Value Date   WBC 6.1 10/10/2022   HGB 10.9 (L) 10/10/2022   HCT 34.2 (L) 10/10/2022   MCV 91.7 10/10/2022   PLT 256 10/10/2022      Chemistry      Component Value Date/Time   NA 142 10/10/2022 1138   NA 142 09/28/2017 0959   K 4.6 10/10/2022 1138   K 4.4 09/28/2017 0959   CL 108 10/10/2022 1138   CO2 31 10/10/2022 1138   CO2 28 09/28/2017 0959   BUN 18 10/10/2022 1138   BUN 21.3 09/28/2017 0959   CREATININE 0.86 10/10/2022 1138   CREATININE 0.9 09/28/2017 0959      Component Value Date/Time   CALCIUM 9.5 10/10/2022 1138  CALCIUM 9.9 09/28/2017 0959   ALKPHOS 83 10/10/2022 1138   ALKPHOS 87 09/28/2017 0959   AST 20 10/10/2022 1138   AST 24 09/28/2017 0959   ALT 18 10/10/2022 1138   ALT 24 09/28/2017 0959   BILITOT 0.3 10/10/2022 1138   BILITOT 0.33 09/28/2017 0959       RADIOGRAPHIC STUDIES: CT Chest W Contrast  Result Date: 10/13/2022 CLINICAL DATA:  Non-small cell lung cancer, staging. * Tracking Code: BO * EXAM: CT CHEST WITH CONTRAST TECHNIQUE: Multidetector CT imaging of the chest was performed during intravenous contrast administration. RADIATION DOSE REDUCTION: This exam was performed according to the departmental dose-optimization program which includes automated exposure control, adjustment of the mA and/or kV according to patient size and/or use of iterative reconstruction technique. CONTRAST:  13m OMNIPAQUE IOHEXOL 300 MG/ML  SOLN COMPARISON:  10/14/2021. FINDINGS: Cardiovascular: Atherosclerotic calcification of the aorta, aortic valve and coronary arteries. Heart is enlarged. No pericardial effusion. Mediastinum/Nodes: Mediastinal and hilar lymph nodes are not enlarged by CT size criteria. No axillary adenopathy. Esophagus is grossly unremarkable. Lungs/Pleura: Postoperative scarring in the right upper lobe with associated volume loss. Peribronchovascular  nodularity, bronchiectasis and mild volume loss in the right middle lobe and lingula, unchanged. Scattered ground-glass nodules measure up to 1.3 cm in the posterior right lower lobe (5/90), unchanged. No solid components. Solid 6 mm left lower lobe nodule in the superior segment (5/64), stable. 5 mm nodule in the posterior left lower lobe (5/111), new. There may be associated clustered peribronchovascular nodularity, suggesting an infectious or inflammatory etiology. No pleural fluid. Airway is unremarkable. Upper Abdomen: 1.4 cm low-attenuation lesion in the left hepatic lobe, unchanged and likely a cyst. Visualized portions of the liver, gallbladder, adrenal glands, kidneys, spleen, pancreas, stomach and bowel are otherwise unremarkable with the exception of a small hiatal hernia. No upper abdominal adenopathy. Musculoskeletal: Degenerative changes in the spine. No worrisome lytic or sclerotic lesions. IMPRESSION: 1. Numerous bilateral ground-glass nodules and a solid 6 mm left lower lobe nodule, stable. Recommend continued attention on follow-up as adenocarcinoma cannot be excluded. 2. New 4 mm left lower lobe nodule, possibly infectious/inflammatory in etiology, given adjacent peribronchovascular nodularity. Recommend attention follow-up. Electronically Signed   By: MLorin PicketM.D.   On: 10/13/2022 13:38     ASSESSMENT AND PLAN:  This is a very pleasant 81years old white female with stage IB non-small cell lung cancer, adenocarcinoma with no actionable mutations and several other groundglass opacities bilaterally.  She is status post wedge resection of the right upper lobe nodule. The patient has been in observation since 2017 with no concerning complaints. The patient is feeling fine today with no concerning complaints. She had repeat CT scan of the chest performed recently.  I personally and independently reviewed the scan images and discussed the result with the patient and her husband. Her  scan showed no concerning findings for disease progression but she continues to have the numerous bilateral groundglass nodules and a solid left lower lobe 6 mm nodule.  There was also a new 4 mm left lower lobe nodule likely inflammatory in etiology. I recommended for the patient to continue on observation but I will repeat CT scan of the chest in around 6 months for further evaluation of these nodules especially the new one. She was advised to call immediately if she has any other concerning symptoms in the interval. The patient voices understanding of current disease status and treatment options and is in agreement with the current  care plan.  All questions were answered. The patient knows to call the clinic with any problems, questions or concerns. We can certainly see the patient much sooner if necessary.  Disclaimer: This note was dictated with voice recognition software. Similar sounding words can inadvertently be transcribed and may not be corrected upon review.

## 2022-10-20 DIAGNOSIS — R7303 Prediabetes: Secondary | ICD-10-CM | POA: Diagnosis not present

## 2022-10-20 DIAGNOSIS — Z79899 Other long term (current) drug therapy: Secondary | ICD-10-CM | POA: Diagnosis not present

## 2022-10-20 DIAGNOSIS — I10 Essential (primary) hypertension: Secondary | ICD-10-CM | POA: Diagnosis not present

## 2022-10-20 DIAGNOSIS — E782 Mixed hyperlipidemia: Secondary | ICD-10-CM | POA: Diagnosis not present

## 2022-10-20 DIAGNOSIS — R7309 Other abnormal glucose: Secondary | ICD-10-CM | POA: Diagnosis not present

## 2022-10-27 DIAGNOSIS — G2581 Restless legs syndrome: Secondary | ICD-10-CM | POA: Diagnosis not present

## 2022-10-27 DIAGNOSIS — I4891 Unspecified atrial fibrillation: Secondary | ICD-10-CM | POA: Diagnosis not present

## 2022-10-27 DIAGNOSIS — D649 Anemia, unspecified: Secondary | ICD-10-CM | POA: Diagnosis not present

## 2022-10-27 DIAGNOSIS — G629 Polyneuropathy, unspecified: Secondary | ICD-10-CM | POA: Diagnosis not present

## 2022-10-27 DIAGNOSIS — D6869 Other thrombophilia: Secondary | ICD-10-CM | POA: Diagnosis not present

## 2022-10-27 DIAGNOSIS — E782 Mixed hyperlipidemia: Secondary | ICD-10-CM | POA: Diagnosis not present

## 2022-10-27 DIAGNOSIS — K219 Gastro-esophageal reflux disease without esophagitis: Secondary | ICD-10-CM | POA: Diagnosis not present

## 2022-10-27 DIAGNOSIS — R7309 Other abnormal glucose: Secondary | ICD-10-CM | POA: Diagnosis not present

## 2022-10-27 DIAGNOSIS — I10 Essential (primary) hypertension: Secondary | ICD-10-CM | POA: Diagnosis not present

## 2022-12-11 DIAGNOSIS — Z23 Encounter for immunization: Secondary | ICD-10-CM | POA: Diagnosis not present

## 2022-12-11 DIAGNOSIS — Z6835 Body mass index (BMI) 35.0-35.9, adult: Secondary | ICD-10-CM | POA: Diagnosis not present

## 2022-12-11 DIAGNOSIS — M5451 Vertebrogenic low back pain: Secondary | ICD-10-CM | POA: Diagnosis not present

## 2022-12-15 DIAGNOSIS — M545 Low back pain, unspecified: Secondary | ICD-10-CM | POA: Diagnosis not present

## 2022-12-18 DIAGNOSIS — M545 Low back pain, unspecified: Secondary | ICD-10-CM | POA: Diagnosis not present

## 2022-12-19 ENCOUNTER — Encounter: Payer: Self-pay | Admitting: Dietician

## 2022-12-19 ENCOUNTER — Encounter: Payer: Medicare HMO | Attending: Family Medicine | Admitting: Dietician

## 2022-12-19 DIAGNOSIS — R7303 Prediabetes: Secondary | ICD-10-CM

## 2022-12-19 NOTE — Progress Notes (Signed)
Patient was seen on 12/19/22 for the Core Session 1 of Diabetes Prevention Program course at [facility]. The following learning objectives were met by the patient during this class:   Learning Objectives:  Be able to explain the purpose and benefits of the National Diabetes Prevention Program.  Be able to describe the events that will take place at every session.  Know the weight loss and physical activity goals established by the [facility] Diabetes Prevention Program.  Know their own individual weight loss and physical activity goals.  Be able to explain the important effect of self-monitoring on behavior change.   Goals:  Record food and beverage intake in "Food and Activity Tracker" over the next week.  Bring completed "Food and Activity Tracker" for session 1 to session 2 next week. Circle the foods or beverages you think are highest in fat and calories in your food tracker. Read the labels on the food you buy, and consider using measuring cups and spoons to help you calculate the amount you eat. We will talk about measuring in more detail in the coming weeks.   Follow-Up Plan: Attend Core Session 2 next week.  Bring completed "Food and Activity Tracker" next week to be reviewed by Lifestyle Coach.

## 2022-12-23 DIAGNOSIS — M545 Low back pain, unspecified: Secondary | ICD-10-CM | POA: Diagnosis not present

## 2022-12-26 ENCOUNTER — Encounter: Payer: Self-pay | Admitting: Registered"

## 2022-12-26 ENCOUNTER — Encounter: Payer: Medicare HMO | Admitting: Registered"

## 2022-12-26 DIAGNOSIS — R7303 Prediabetes: Secondary | ICD-10-CM

## 2022-12-26 NOTE — Progress Notes (Signed)
Patient was seen on 12/26/22 for the Core Session 2 of Diabetes Prevention Program course at [facility]. By the end of this session patients are able to complete the following objectives:   Learning Objectives: Self-monitor their weight during the weeks following Session 2.  Describe the relationship between fat and calories.  Explain the reason for, and basic principles of, self-monitoring fat grams and calories.  Identify their personal fat gram goals.  Use the ?Fat and Calorie Counter? to calculate the calories and fat grams of a given selection of foods.  Keep a running total of the fat grams they eat each day.  Calculate fat, calories, and serving sizes from nutrition labels.   Goals:  Weigh yourself at the same time each day, or every few days, and record your weight in your Food and Activity Tracker. Write down everything you eat and drink in your Food and Activity Tracker. Measure portions as much as you can, and start reading labels.  Use the ?Fat and Calorie Counter? to figure out the amount of fat and calories in what you ate, and write the amount down in your Food and Activity Tracker. Keep a running fat gram total throughout the day. Come as close to your fat gram goal as you can.   Follow-Up Plan: Attend Core Session 3 next week.  Bring completed "Food and Activity Tracker" next week to be reviewed by Lifestyle Coach.

## 2022-12-29 DIAGNOSIS — M545 Low back pain, unspecified: Secondary | ICD-10-CM | POA: Diagnosis not present

## 2023-01-02 ENCOUNTER — Encounter: Payer: Medicare HMO | Attending: Dietician | Admitting: Dietician

## 2023-01-02 ENCOUNTER — Encounter: Payer: Self-pay | Admitting: Dietician

## 2023-01-02 DIAGNOSIS — R7303 Prediabetes: Secondary | ICD-10-CM | POA: Insufficient documentation

## 2023-01-02 NOTE — Progress Notes (Signed)
Patient was seen on 01/02/23 for the Core Session 3 of Diabetes Prevention Program course at [facility]. By the end of this session patients are able to complete the following objectives:   Learning Objectives: Weigh and measure foods. Estimate the fat and calorie content of common foods. Describe three ways to eat less fat and fewer calories. Create a plan to eat less fat for the following week.   Goals:  Track weight when weighing outside of class.  Track food and beverages eaten each day in Food and Activity Tracker and include fat grams and calories for each.  Try to stay within fat gram goal.  Complete plan for eating less high fat foods and answer related homework questions.    Follow-Up Plan: Attend Core Session 4 next week.  Bring completed "Food and Activity Tracker" next week to be reviewed by Lifestyle Coach.

## 2023-01-16 ENCOUNTER — Encounter: Payer: Medicare HMO | Admitting: Dietician

## 2023-01-23 ENCOUNTER — Encounter: Payer: Medicare HMO | Admitting: Dietician

## 2023-01-23 ENCOUNTER — Encounter: Payer: Self-pay | Admitting: Dietician

## 2023-01-23 DIAGNOSIS — R7303 Prediabetes: Secondary | ICD-10-CM

## 2023-01-23 NOTE — Progress Notes (Signed)
Patient was seen on 01/23/23 for the Core Session 6 of Diabetes Prevention Program course at [facility]. By the end of this session patients are able to complete the following objectives:   Learning Objectives: Graph their daily physical activity.  Describe two ways of finding the time to be active.  Define "lifestyle activity."  Describe how to prevent injury.  Develop an activity plan for the coming week.   Goals:  Record weight taken outside of class.  Track foods and beverages eaten each day in the "Food and Activity Tracker," including calories and fat grams for each item.   Track activity type, minutes you were active, and distance you reached each day in the "Food and Activity Tracker."  Set aside one 20 to 30-minute block of time every day or find two or more periods of 10 to15 minutes each for physical activity.  Warm up, cool down, and stretch. Make a Physical Activities Plan for the Week.   Follow-Up Plan: Attend Core Session 7 next week.  Bring completed "Food and Activity Tracker" next week to be reviewed by Lifestyle Coach.

## 2023-01-29 DIAGNOSIS — M545 Low back pain, unspecified: Secondary | ICD-10-CM | POA: Diagnosis not present

## 2023-01-30 ENCOUNTER — Encounter: Payer: Self-pay | Admitting: Dietician

## 2023-01-30 ENCOUNTER — Encounter: Payer: Medicare HMO | Attending: Family Medicine | Admitting: Dietician

## 2023-01-30 DIAGNOSIS — R7303 Prediabetes: Secondary | ICD-10-CM

## 2023-01-30 NOTE — Progress Notes (Signed)
Patient was seen on 01/30/23 for the Core Session 7 of Diabetes Prevention Program course at [facility]. By the end of this session patients are able to complete the following objectives:   Learning Objectives: Define calorie balance. Explain how healthy eating and being active are related in terms of calorie balance.  Describe the relationship between calorie balance and weight loss.  Describe his or her progress as it relates to calorie balance.  Develop an activity plan for the coming week.   Goals:  Record weight taken outside of class.  Track foods and beverages eaten each day in the "Food and Activity Tracker," including calories and fat grams for each item.   Track activity type, minutes you were active, and distance you reached each day in the "Food and Activity Tracker."  Set aside one 20 to 30-minute block of time every day or find two or more periods of 10 to15 minutes each for physical activity.  Make a Physical Activities Plan for the Week.  Make active lifestyle choices all through the day  Stay at or go slightly over activity goal.   Follow-Up Plan: Attend Core Session 8 next week.  Bring completed "Food and Activity Tracker" next week to be reviewed by Lifestyle Coach.

## 2023-02-03 DIAGNOSIS — D649 Anemia, unspecified: Secondary | ICD-10-CM | POA: Diagnosis not present

## 2023-02-03 DIAGNOSIS — D508 Other iron deficiency anemias: Secondary | ICD-10-CM | POA: Diagnosis not present

## 2023-02-06 ENCOUNTER — Encounter: Payer: Medicare HMO | Admitting: Dietician

## 2023-02-06 ENCOUNTER — Encounter: Payer: Self-pay | Admitting: Dietician

## 2023-02-06 DIAGNOSIS — R7303 Prediabetes: Secondary | ICD-10-CM

## 2023-02-06 NOTE — Progress Notes (Signed)
Patient was seen on 02/06/23 for the Core Session 8 of Diabetes Prevention Program course at [facility]. By the end of this session patients are able to complete the following objectives:   Learning Objectives: Recognize positive and negative food and activity cues.  Change negative food and activity cues to positive cues.  Add positive cues for activity and eliminate cues for inactivity.  Develop a plan for removing one problem food cue for the coming week.   Goals:  Record weight taken outside of class.  Track foods and beverages eaten each day in the "Food and Activity Tracker," including calories and fat grams for each item.   Track activity type, minutes you were active, and distance you reached each day in the "Food and Activity Tracker."  Set aside one 20 to 30-minute block of time every day or find two or more periods of 10 to15 minutes each for physical activity.  Remove one problem food cue.  Add one positive cue for being more active.  Follow-Up Plan: Attend Core Session 9 next week.  Bring completed "Food and Activity Tracker" next week to be reviewed by Lifestyle Coach.

## 2023-02-12 ENCOUNTER — Other Ambulatory Visit: Payer: Self-pay | Admitting: Family Medicine

## 2023-02-12 DIAGNOSIS — Z1231 Encounter for screening mammogram for malignant neoplasm of breast: Secondary | ICD-10-CM

## 2023-02-13 ENCOUNTER — Encounter: Payer: Medicare HMO | Admitting: Dietician

## 2023-02-13 ENCOUNTER — Encounter: Payer: Self-pay | Admitting: Dietician

## 2023-02-13 DIAGNOSIS — R7303 Prediabetes: Secondary | ICD-10-CM

## 2023-02-13 NOTE — Progress Notes (Signed)
Patient was seen on 02/13/23 for the Core Session 9 of Diabetes Prevention Program course at [facility]. By the end of this session patients are able to complete the following objectives:   Learning Objectives: List and describe five steps to problem solving.  Apply the five problem solving steps to resolve a problem he or she has with eating less fat and fewer calories or being more active.   Goals:  Record weight taken outside of class.  Track foods and beverages eaten each day in the "Food and Activity Tracker," including calories and fat grams for each item.   Track activity type, minutes you were active, and distance you reached each day in the "Food and Activity Tracker."  Set aside one 20 to 30-minute block of time every day or find two or more periods of 10 to15 minutes each for physical activity.  Use problem solving action plan created during session to problem solve.   Follow-Up Plan: Attend Core Session 10 next week.  Bring completed "Food and Activity Tracker" next week to be reviewed by Lifestyle Coach. Bring menus from favorite restaurants to next session for future discussion.

## 2023-02-18 DIAGNOSIS — D508 Other iron deficiency anemias: Secondary | ICD-10-CM | POA: Diagnosis not present

## 2023-02-20 ENCOUNTER — Encounter (HOSPITAL_BASED_OUTPATIENT_CLINIC_OR_DEPARTMENT_OTHER): Payer: Self-pay | Admitting: Emergency Medicine

## 2023-02-20 ENCOUNTER — Emergency Department (HOSPITAL_BASED_OUTPATIENT_CLINIC_OR_DEPARTMENT_OTHER): Payer: Medicare HMO

## 2023-02-20 ENCOUNTER — Inpatient Hospital Stay (HOSPITAL_BASED_OUTPATIENT_CLINIC_OR_DEPARTMENT_OTHER)
Admission: EM | Admit: 2023-02-20 | Discharge: 2023-02-23 | DRG: 176 | Disposition: A | Discharge status: Home / Self Care | Payer: Medicare HMO | Attending: Internal Medicine | Admitting: Internal Medicine

## 2023-02-20 ENCOUNTER — Other Ambulatory Visit: Payer: Self-pay

## 2023-02-20 DIAGNOSIS — Z833 Family history of diabetes mellitus: Secondary | ICD-10-CM

## 2023-02-20 DIAGNOSIS — Z885 Allergy status to narcotic agent status: Secondary | ICD-10-CM | POA: Diagnosis not present

## 2023-02-20 DIAGNOSIS — Z7901 Long term (current) use of anticoagulants: Secondary | ICD-10-CM

## 2023-02-20 DIAGNOSIS — Z86010 Personal history of colonic polyps: Secondary | ICD-10-CM

## 2023-02-20 DIAGNOSIS — Z79899 Other long term (current) drug therapy: Secondary | ICD-10-CM | POA: Diagnosis not present

## 2023-02-20 DIAGNOSIS — Z888 Allergy status to other drugs, medicaments and biological substances status: Secondary | ICD-10-CM | POA: Diagnosis not present

## 2023-02-20 DIAGNOSIS — D6869 Other thrombophilia: Secondary | ICD-10-CM | POA: Diagnosis present

## 2023-02-20 DIAGNOSIS — J40 Bronchitis, not specified as acute or chronic: Secondary | ICD-10-CM | POA: Diagnosis present

## 2023-02-20 DIAGNOSIS — Z801 Family history of malignant neoplasm of trachea, bronchus and lung: Secondary | ICD-10-CM

## 2023-02-20 DIAGNOSIS — I480 Paroxysmal atrial fibrillation: Secondary | ICD-10-CM | POA: Diagnosis present

## 2023-02-20 DIAGNOSIS — Z85118 Personal history of other malignant neoplasm of bronchus and lung: Secondary | ICD-10-CM | POA: Diagnosis not present

## 2023-02-20 DIAGNOSIS — C3490 Malignant neoplasm of unspecified part of unspecified bronchus or lung: Secondary | ICD-10-CM | POA: Diagnosis present

## 2023-02-20 DIAGNOSIS — Z8249 Family history of ischemic heart disease and other diseases of the circulatory system: Secondary | ICD-10-CM

## 2023-02-20 DIAGNOSIS — R0602 Shortness of breath: Secondary | ICD-10-CM | POA: Diagnosis not present

## 2023-02-20 DIAGNOSIS — Z1152 Encounter for screening for COVID-19: Secondary | ICD-10-CM | POA: Diagnosis not present

## 2023-02-20 DIAGNOSIS — J45909 Unspecified asthma, uncomplicated: Secondary | ICD-10-CM | POA: Diagnosis not present

## 2023-02-20 DIAGNOSIS — I700 Atherosclerosis of aorta: Secondary | ICD-10-CM | POA: Diagnosis not present

## 2023-02-20 DIAGNOSIS — G2581 Restless legs syndrome: Secondary | ICD-10-CM | POA: Diagnosis not present

## 2023-02-20 DIAGNOSIS — K5520 Angiodysplasia of colon without hemorrhage: Secondary | ICD-10-CM | POA: Diagnosis present

## 2023-02-20 DIAGNOSIS — F419 Anxiety disorder, unspecified: Secondary | ICD-10-CM | POA: Diagnosis present

## 2023-02-20 DIAGNOSIS — D638 Anemia in other chronic diseases classified elsewhere: Secondary | ICD-10-CM | POA: Diagnosis not present

## 2023-02-20 DIAGNOSIS — Z800 Family history of malignant neoplasm of digestive organs: Secondary | ICD-10-CM

## 2023-02-20 DIAGNOSIS — E669 Obesity, unspecified: Secondary | ICD-10-CM | POA: Diagnosis present

## 2023-02-20 DIAGNOSIS — I10 Essential (primary) hypertension: Secondary | ICD-10-CM | POA: Diagnosis not present

## 2023-02-20 DIAGNOSIS — Z6835 Body mass index (BMI) 35.0-35.9, adult: Secondary | ICD-10-CM | POA: Diagnosis not present

## 2023-02-20 DIAGNOSIS — K219 Gastro-esophageal reflux disease without esophagitis: Secondary | ICD-10-CM | POA: Diagnosis present

## 2023-02-20 DIAGNOSIS — Z87891 Personal history of nicotine dependence: Secondary | ICD-10-CM

## 2023-02-20 DIAGNOSIS — I2699 Other pulmonary embolism without acute cor pulmonale: Secondary | ICD-10-CM | POA: Diagnosis not present

## 2023-02-20 DIAGNOSIS — I2602 Saddle embolus of pulmonary artery with acute cor pulmonale: Secondary | ICD-10-CM | POA: Diagnosis not present

## 2023-02-20 DIAGNOSIS — E785 Hyperlipidemia, unspecified: Secondary | ICD-10-CM | POA: Diagnosis present

## 2023-02-20 DIAGNOSIS — C3491 Malignant neoplasm of unspecified part of right bronchus or lung: Secondary | ICD-10-CM | POA: Diagnosis not present

## 2023-02-20 LAB — CBC WITH DIFFERENTIAL/PLATELET
Abs Immature Granulocytes: 0.03 10*3/uL (ref 0.00–0.07)
Basophils Absolute: 0 10*3/uL (ref 0.0–0.1)
Basophils Relative: 1 %
Eosinophils Absolute: 0.1 10*3/uL (ref 0.0–0.5)
Eosinophils Relative: 1 %
HCT: 33.6 % — ABNORMAL LOW (ref 36.0–46.0)
Hemoglobin: 10.6 g/dL — ABNORMAL LOW (ref 12.0–15.0)
Immature Granulocytes: 0 %
Lymphocytes Relative: 20 %
Lymphs Abs: 1.7 10*3/uL (ref 0.7–4.0)
MCH: 29.1 pg (ref 26.0–34.0)
MCHC: 31.5 g/dL (ref 30.0–36.0)
MCV: 92.3 fL (ref 80.0–100.0)
Monocytes Absolute: 0.6 10*3/uL (ref 0.1–1.0)
Monocytes Relative: 8 %
Neutro Abs: 5.9 10*3/uL (ref 1.7–7.7)
Neutrophils Relative %: 70 %
Platelets: 268 10*3/uL (ref 150–400)
RBC: 3.64 MIL/uL — ABNORMAL LOW (ref 3.87–5.11)
RDW: 13.9 % (ref 11.5–15.5)
WBC: 8.4 10*3/uL (ref 4.0–10.5)
nRBC: 0 % (ref 0.0–0.2)

## 2023-02-20 LAB — BASIC METABOLIC PANEL
Anion gap: 9 (ref 5–15)
BUN: 31 mg/dL — ABNORMAL HIGH (ref 8–23)
CO2: 27 mmol/L (ref 22–32)
Calcium: 9.7 mg/dL (ref 8.9–10.3)
Chloride: 102 mmol/L (ref 98–111)
Creatinine, Ser: 1.04 mg/dL — ABNORMAL HIGH (ref 0.44–1.00)
GFR, Estimated: 55 mL/min — ABNORMAL LOW (ref >60)
Glucose, Bld: 103 mg/dL — ABNORMAL HIGH (ref 70–99)
Potassium: 4 mmol/L (ref 3.5–5.1)
Sodium: 138 mmol/L (ref 135–145)

## 2023-02-20 LAB — RESP PANEL BY RT-PCR (RSV, FLU A&B, COVID)  RVPGX2
Influenza A by PCR: NEGATIVE
Influenza B by PCR: NEGATIVE
Resp Syncytial Virus by PCR: NEGATIVE
SARS Coronavirus 2 by RT PCR: NEGATIVE

## 2023-02-20 LAB — TROPONIN I (HIGH SENSITIVITY)
Troponin I (High Sensitivity): 7 ng/L (ref <18)
Troponin I (High Sensitivity): 6 ng/L (ref <18)

## 2023-02-20 MED ORDER — AEROCHAMBER PLUS FLO-VU LARGE MISC
1.0000 | Freq: Once | Status: AC
  Administered 2023-02-20: 1
  Filled 2023-02-20: qty 1

## 2023-02-20 MED ORDER — IOHEXOL 350 MG/ML SOLN
100.0000 mL | Freq: Once | INTRAVENOUS | Status: AC | PRN
  Administered 2023-02-20: 75 mL via INTRAVENOUS

## 2023-02-20 MED ORDER — SODIUM CHLORIDE 0.9 % IV BOLUS
1000.0000 mL | Freq: Once | INTRAVENOUS | Status: AC
  Administered 2023-02-20: 1000 mL via INTRAVENOUS

## 2023-02-20 MED ORDER — ALBUTEROL SULFATE (2.5 MG/3ML) 0.083% IN NEBU
2.5000 mg | INHALATION_SOLUTION | Freq: Once | RESPIRATORY_TRACT | Status: AC
  Administered 2023-02-20: 2.5 mg via RESPIRATORY_TRACT
  Filled 2023-02-20: qty 3

## 2023-02-20 MED ORDER — LORAZEPAM 1 MG PO TABS
1.0000 mg | ORAL_TABLET | Freq: Once | ORAL | Status: AC
  Administered 2023-02-20: 1 mg via ORAL
  Filled 2023-02-20: qty 1

## 2023-02-20 MED ORDER — IPRATROPIUM-ALBUTEROL 0.5-2.5 (3) MG/3ML IN SOLN
3.0000 mL | Freq: Once | RESPIRATORY_TRACT | Status: AC
  Administered 2023-02-20: 3 mL via RESPIRATORY_TRACT
  Filled 2023-02-20: qty 3

## 2023-02-20 MED ORDER — ENOXAPARIN SODIUM 100 MG/ML IJ SOSY
90.0000 mg | PREFILLED_SYRINGE | Freq: Once | INTRAMUSCULAR | Status: AC
  Administered 2023-02-20: 90 mg via SUBCUTANEOUS
  Filled 2023-02-20: qty 1

## 2023-02-20 NOTE — ED Provider Notes (Signed)
[place] [facility] Provider Note   CSN: [id number] Arrival date & time: 02/20/23  1738     History  Chief Complaint  Patient presents with   Shortness of Breath    Julie Gill is a 82 y.o. female history of A-fib on Xeralto, adenocarcinoma of lung, presented with shortness of breath that began week ago.  Patient states shortness of breath is at rest.  Patient noted that her primary called her saying her hemoglobin was low and thinks that she is having a GI bleed however her fecal occult test was negative and patient denied any hematochezia or melena.  Patient's husband had URI 2 weeks ago.  Patient denied leg swelling, chest pain, fever/chills, hemoptysis or recent travel/hospitalization/surgery, changes in sensation/motor skills  Home Medications Prior to Admission medications   Medication Sig Start Date End Date Taking? Authorizing Provider  acetaminophen (TYLENOL) 500 MG tablet Take 1,000 mg by mouth daily as needed (pain).    [provider]  amLODipine (NORVASC) 2.5 MG tablet Take 2.5 mg by mouth daily. 05/01/22   [provider]  calcium carbonate (OS-CAL) 600 MG TABS Take 600 mg by mouth daily.    [provider]  Cholecalciferol (VITAMIN D PO) Take 4,000 Units by mouth daily.      [provider]  clonazePAM (KLONOPIN) 0.5 MG tablet Take 0.25 mg by mouth daily as needed for anxiety.    [provider]  famotidine (PEPCID) 20 MG tablet Take 20 mg by mouth 2 (two) times daily.    [provider]  lisinopril-hydrochlorothiazide (ZESTORETIC) 20-25 MG tablet Take 1 tablet by mouth daily. 06/17/21   [provider]  lovastatin (MEVACOR) 40 MG tablet Take 80 mg by mouth daily.  09/11/12   [provider]  MAGNESIUM PO Take 250 mg by mouth daily.    [provider]  melatonin 5 MG TABS Take 10 mg by mouth at bedtime.    [provider]  nitroGLYCERIN  (NITROSTAT) 0.4 MG SL tablet Place 1 tablet (0.4 mg total) under the tongue every 5 (five) minutes as needed for chest pain. Patient not taking: Reported on 10/14/2022 08/13/20   Allred, Jeneen Rinks, MD  polyethylene glycol (MIRALAX / GLYCOLAX) 17 g packet Take 17 g by mouth daily.    [provider]  potassium chloride SA (K-DUR,KLOR-CON) 20 MEQ tablet Take 20 mEq by mouth daily.    [provider]  senna (SENOKOT) 8.6 MG tablet Take 1 tablet by mouth daily.    [provider]  vitamin E 180 MG (400 UNITS) capsule Take 400 Units by mouth daily.     [provider]  XARELTO 20 MG TABS tablet Take 1 tablet (20 mg total) by mouth daily with supper. 03/14/22   Fenton, Clint R, PA      Allergies    Codeine and Prednisone    Review of Systems   Review of Systems  Respiratory:  Positive for shortness of breath.   See HPI  Physical Exam Updated Vital Signs BP (!) 152/62 (BP Location: Left Arm)   Pulse 97   Temp 97.8 F (36.6 C) (Oral)   Resp 18   Ht 5\' 2"  (1.575 m)   Wt 87.1 kg   SpO2 100%   BMI 35.12 kg/m  Physical Exam Constitutional:      General: She is not in acute distress. HENT:     Head: Normocephalic and atraumatic.  Eyes:  Extraocular Movements: Extraocular movements intact.     Conjunctiva/sclera: Conjunctivae normal.  Cardiovascular:     Rate and Rhythm: Normal rate and regular rhythm.     Pulses: Normal pulses.     Heart sounds: Normal heart sounds.  Pulmonary:     Effort: No respiratory distress.     Breath sounds: Wheezing (Bilaterally) present.     Comments: Speaking in full sentences Musculoskeletal:        General: Normal range of motion.     Cervical back: Normal range of motion.  Skin:    General: Skin is warm and dry.     Capillary Refill: Capillary refill takes less than 2 seconds.  Neurological:     Mental Status: She is alert and oriented to person, place, and time.  Psychiatric:        Mood and Affect: Mood normal.      ED Results / Procedures / Treatments   Labs (all labs ordered are listed, but only abnormal results are displayed) Labs Reviewed  BASIC METABOLIC PANEL - Abnormal; Notable for the following components:      Result Value   Glucose, Bld 103 (*)    BUN 31 (*)    Creatinine, Ser 1.04 (*)    GFR, Estimated 55 (*)    All other components within normal limits  CBC WITH DIFFERENTIAL/PLATELET - Abnormal; Notable for the following components:   RBC 3.64 (*)    Hemoglobin 10.6 (*)    HCT 33.6 (*)    All other components within normal limits  RESP PANEL BY RT-PCR (RSV, FLU A&B, COVID)  RVPGX2  TROPONIN I (HIGH SENSITIVITY)  TROPONIN I (HIGH SENSITIVITY)    EKG EKG Interpretation  Date/Time:  Friday February 20 2023 18:04:38 EDT Ventricular Rate:  80 PR Interval:  131 QRS Duration: 93 QT Interval:  401 QTC Calculation: 463 R Axis:   16 Text Interpretation: Sinus rhythm Ventricular premature complex when compared to prior, similar appearance with a PVC. No STEMI Confirmed by Antony Blackbird [phone]) on 02/20/2023 6:05:42 PM  Radiology CT Angio Chest PE W/Cm &/Or Wo Cm  Result Date: 02/20/2023 CLINICAL DATA:  Lung cancer, dyspnea, productive cough EXAM: CT ANGIOGRAPHY CHEST WITH CONTRAST TECHNIQUE: Multidetector CT imaging of the chest was performed using the standard protocol during bolus administration of intravenous contrast. Multiplanar CT image reconstructions and MIPs were obtained to evaluate the vascular anatomy. RADIATION DOSE REDUCTION: This exam was performed according to the departmental dose-optimization program which includes automated exposure control, adjustment of the mA and/or kV according to patient size and/or use of iterative reconstruction technique. CONTRAST:  72mL OMNIPAQUE IOHEXOL 350 MG/ML SOLN COMPARISON:  10/10/2022 FINDINGS: Cardiovascular: Mild coronary artery calcification. Global cardiac size within normal limits. No pericardial effusion. Central pulmonary  arteries are normal caliber. There is adequate opacification of the pulmonary arterial tree. A tiny intraluminal filling defect is identified within the right lower lobar posterior segmental pulmonary arterial branch in keeping with acute pulmonary embolus. The embolic burden is tiny. There is no CT evidence of right heart strain. Moderate atherosclerotic calcification within the thoracic aorta. No aortic aneurysm. Mediastinum/Nodes: Visualized thyroid is unremarkable. No pathologic thoracic adenopathy. Esophagus is unremarkable. Small hiatal hernia. Lungs/Pleura: Right apical wedge resection again noted with mild residual scarring. Bibasilar scarring within the right middle lobe and lingula again noted. Ground-glass pulmonary nodules within the right lung and noncalcified 6 mm solid pulmonary nodule within the a left lower lobe are stable. Previously noted inflammatory appearing pulmonary nodule  within the posterior basal left lower lobe has resolved. No new focal pulmonary nodules or infiltrates. No pneumothorax or pleural effusion. Central airways are widely patent. Upper Abdomen: No acute abnormality. Musculoskeletal: Osseous structures are age-appropriate. No acute bone abnormality. Review of the MIP images confirms the above findings. IMPRESSION: 1. Acute pulmonary embolus with tiny embolic burden. No CT evidence of right heart strain. 2. Mild coronary artery calcification. 3. Multiple stable ground-glass and solid pulmonary nodules. Continued attention on surveillance CT imaging is warranted. These results were called by telephone at the time of interpretation on 02/20/2023 at 8:18 pm to provider [facility] , who verbally acknowledged these results. Aortic Atherosclerosis (ICD10-I70.0). Electronically Signed   By: Fidela Salisbury M.D.   On: 02/20/2023 20:18   DG Chest Port 1 View  Result Date: 02/20/2023 CLINICAL DATA:  Shortness of breath. EXAM: PORTABLE CHEST 1 VIEW COMPARISON:  Chest radiograph dated  08/20/2022 and CT dated 10/10/2022. FINDINGS: Postsurgical changes of the right upper lobe and surgical suture. Bibasilar densities, right greater left may represent combination of atelectasis/scarring or correspond to the scattered nodular densities in the right middle lobe seen on the prior CT. No pleural effusion pneumothorax. The cardiac silhouette is within normal limits. Atherosclerotic calcification of the aortic arch. No acute osseous pathology. Degenerative changes of the spine. IMPRESSION: 1. Bibasilar atelectasis/scarring. Infiltrate in the right lung base is not excluded. Clinical correlation is recommended. 2. Postsurgical changes of the right upper lobe. Electronically Signed   By: Anner Crete M.D.   On: 02/20/2023 18:58    Procedures Procedures    Medications Ordered in ED Medications  albuterol (PROVENTIL) (2.5 MG/3ML) 0.083% nebulizer solution 2.5 mg (2.5 mg Nebulization Given 02/20/23 1811)  ipratropium-albuterol (DUONEB) 0.5-2.5 (3) MG/3ML nebulizer solution 3 mL (3 mLs Nebulization Given 02/20/23 1811)  ipratropium-albuterol (DUONEB) 0.5-2.5 (3) MG/3ML nebulizer solution 3 mL (3 mLs Nebulization Given 02/20/23 2005)  AeroChamber Plus Flo-Vu Large MISC 1 each (1 each Other Given 02/20/23 1956)  iohexol (OMNIPAQUE) 350 MG/ML injection 100 mL (75 mLs Intravenous Contrast Given 02/20/23 1957)  sodium chloride 0.9 % bolus 1,000 mL (1,000 mLs Intravenous New Bag/Given 02/20/23 2104)  enoxaparin (LOVENOX) injection 90 mg (90 mg Subcutaneous Given 02/20/23 2145)  LORazepam (ATIVAN) tablet 1 mg (1 mg Oral Given 02/20/23 2139)    ED Course/ Medical Decision Making/ A&P                             Medical Decision Making Amount and/or Complexity of Data Reviewed Labs: ordered. Radiology: ordered.  Risk Prescription drug management. Decision regarding hospitalization.   Julie Gill 82 y.o. presented today for shortness of breath. Working DDx that I considered at this time  includes, but not limited to, URI, pneumonia, PE, ACS, anemia.  R/o DDx: URI, pneumonia, ACS: These are considered less likely due to history of present illness and physical exam findings  Review of prior external notes: 02/20/2023 office visit  Unique Tests and My Interpretation:  BMP: Increased BUN 31 increased creatinine 1.04, GFR 55 CBC: Hemoglobin 10.6 Respiratory panel: Negative Troponin: 7, Chest x-ray: Bibasilar atelectasis/scarring, right lower lobe infiltrate CTA PE: Acute PE with tiny embolic burden, stable groundglass and nodules EKG: Sinus 80 bpm, 1 PVC noted otherwise no ST abnormalities or blocks noted  Discussion with Independent Historian: None  Discussion of Management of Tests:  Ennever MD, Hematologist Velia Meyer, [place] hospitalist  Risk:  High:  - hospitalization or escalation  of hospital-level care  Risk Stratification Score: None  Staffed with Tegeler, MD  Plan: Patient presented for shortness of breath. On exam patient was receiving a DuoNeb treatment had stable vitals.  After DuoNeb patient was still wheezing in bilateral lung fields and another DuoNeb was ordered.  Due to patient's history of lung cancer along with being on a blood thinner and shortness of breath for the past week PE study was ordered along with labs.  Patient stable at this time.  CTA showed small PE with no signs of heart strain.  Hematologist was consulted and recommended Lovenox with admission.  Hospitalist will be consulted.  Patient will be given fluids and stable at this time.  Hospitalist accepted patient for admission.  Lovenox was initiated.  Patient stable for admission at this time.        Final Clinical Impression(s) / ED Diagnoses Final diagnoses:  Other acute pulmonary embolism, unspecified whether acute cor pulmonale present [facility])    Rx / DC Orders ED Discharge Orders     None         Elvina Sidle 02/20/23 2158    Tegeler, Gwenyth Allegra,  MD 02/20/23 [phone]

## 2023-02-20 NOTE — ED Notes (Signed)
RT educated pt on the proper use of MDI w/spacer. Pt able to perform w/out difficulty. Pt verbalizes understanding. °

## 2023-02-20 NOTE — ED Triage Notes (Signed)
Pt arrived POV, caox4, ambulatory c/o SOB, chest tightness, productive cough x1 wk. Pt further reports her PCP told her to come to the ED d/t low hemoglobin. Pt reports husband was treated for URI approx 2 weeks ago. Hx asthma, afib. Wheezing and diminished on arrival to ED.

## 2023-02-20 NOTE — Progress Notes (Signed)
Hospitalist Transfer Note:  Transferring facility: [facility] Requesting provider: Lurena Nida, PA (EDP at [facility]) Reason for transfer: admission for further evaluation and management of acute pulmonary embolism.      82 y.o.  F, with history of paroxysmal atrial fibrillation compliant with chronic anticoagulation on Xarelto, who presented to [facility] ED complaining of  sob, with some associated wheezing, which is improved with duo nebulizer treatment.  CTA chest showed evidence of acute pulmonary embolism, without reported evidence of corresponding right heart strain.  Vital signs in the ED were notable for the following: No hypoxia.  Normotensive.  In the setting of apparent failure of outpatient Xarelto, which the patient has been on for several years, EDP discussed patient's case with the on-call heme-onc physician, Dr. Marin Olp, who recommended initiation of full dose Lovenox.  Medications administered prior to transfer included the following: Lovenox 90 mg subcu x 1 dose.   Subsequently, I accepted this patient for transfer for inpatient admission to a med/tele bed at [facility] or [facility] ( first available) for further work-up and management of the above .      Nursing staff, Please call [place] number on Amion [phone]) as soon as patient's arrival, so appropriate admitting provider can evaluate the pt.     Babs Bertin, DO Hospitalist

## 2023-02-21 ENCOUNTER — Inpatient Hospital Stay (HOSPITAL_COMMUNITY): Payer: Medicare HMO

## 2023-02-21 DIAGNOSIS — I2699 Other pulmonary embolism without acute cor pulmonale: Secondary | ICD-10-CM

## 2023-02-21 DIAGNOSIS — I2602 Saddle embolus of pulmonary artery with acute cor pulmonale: Secondary | ICD-10-CM

## 2023-02-21 DIAGNOSIS — I480 Paroxysmal atrial fibrillation: Secondary | ICD-10-CM | POA: Diagnosis not present

## 2023-02-21 DIAGNOSIS — C3491 Malignant neoplasm of unspecified part of right bronchus or lung: Secondary | ICD-10-CM | POA: Diagnosis not present

## 2023-02-21 LAB — CBC WITH DIFFERENTIAL/PLATELET
Abs Immature Granulocytes: 0.02 10*3/uL (ref 0.00–0.07)
Basophils Absolute: 0 10*3/uL (ref 0.0–0.1)
Basophils Relative: 0 %
Eosinophils Absolute: 0 10*3/uL (ref 0.0–0.5)
Eosinophils Relative: 0 %
HCT: 28.3 % — ABNORMAL LOW (ref 36.0–46.0)
Hemoglobin: 8.7 g/dL — ABNORMAL LOW (ref 12.0–15.0)
Immature Granulocytes: 0 %
Lymphocytes Relative: 17 %
Lymphs Abs: 1.2 10*3/uL (ref 0.7–4.0)
MCH: 29.2 pg (ref 26.0–34.0)
MCHC: 30.7 g/dL (ref 30.0–36.0)
MCV: 95 fL (ref 80.0–100.0)
Monocytes Absolute: 0.5 10*3/uL (ref 0.1–1.0)
Monocytes Relative: 7 %
Neutro Abs: 5.4 10*3/uL (ref 1.7–7.7)
Neutrophils Relative %: 76 %
Platelets: 211 10*3/uL (ref 150–400)
RBC: 2.98 MIL/uL — ABNORMAL LOW (ref 3.87–5.11)
RDW: 14 % (ref 11.5–15.5)
WBC: 7.1 10*3/uL (ref 4.0–10.5)
nRBC: 0 % (ref 0.0–0.2)

## 2023-02-21 LAB — ECHOCARDIOGRAM COMPLETE
Area-P 1/2: 3.87 cm2
Calc EF: 67.5 %
Height: 62 in
MV VTI: 2.39 cm2
S' Lateral: 2.6 cm
Single Plane A2C EF: 68.8 %
Single Plane A4C EF: 66.1 %
Weight: 3072 oz

## 2023-02-21 LAB — BASIC METABOLIC PANEL
Anion gap: 8 (ref 5–15)
BUN: 24 mg/dL — ABNORMAL HIGH (ref 8–23)
CO2: 25 mmol/L (ref 22–32)
Calcium: 8.7 mg/dL — ABNORMAL LOW (ref 8.9–10.3)
Chloride: 105 mmol/L (ref 98–111)
Creatinine, Ser: 0.76 mg/dL (ref 0.44–1.00)
GFR, Estimated: >60 mL/min (ref >60)
Glucose, Bld: 137 mg/dL — ABNORMAL HIGH (ref 70–99)
Potassium: 3.9 mmol/L (ref 3.5–5.1)
Sodium: 138 mmol/L (ref 135–145)

## 2023-02-21 MED ORDER — LISINOPRIL 20 MG PO TABS
20.0000 mg | ORAL_TABLET | Freq: Every day | ORAL | Status: DC
  Administered 2023-02-21 – 2023-02-23 (×3): 20 mg via ORAL
  Filled 2023-02-21 (×3): qty 1

## 2023-02-21 MED ORDER — ONDANSETRON HCL 4 MG PO TABS: 4.0000 mg | ORAL_TABLET | Freq: Four times a day (QID) | ORAL | Status: DC | PRN

## 2023-02-21 MED ORDER — PRAVASTATIN SODIUM 20 MG PO TABS
10.0000 mg | ORAL_TABLET | Freq: Every day | ORAL | Status: DC
  Administered 2023-02-21 – 2023-02-22 (×2): 10 mg via ORAL
  Filled 2023-02-21 (×2): qty 1

## 2023-02-21 MED ORDER — FAMOTIDINE 20 MG PO TABS
20.0000 mg | ORAL_TABLET | Freq: Two times a day (BID) | ORAL | Status: DC
  Administered 2023-02-21 – 2023-02-23 (×6): 20 mg via ORAL
  Filled 2023-02-21 (×6): qty 1

## 2023-02-21 MED ORDER — POTASSIUM CHLORIDE CRYS ER 20 MEQ PO TBCR
20.0000 meq | EXTENDED_RELEASE_TABLET | Freq: Every day | ORAL | Status: DC
  Administered 2023-02-21 – 2023-02-23 (×3): 20 meq via ORAL
  Filled 2023-02-21 (×3): qty 1

## 2023-02-21 MED ORDER — HYDROCHLOROTHIAZIDE 25 MG PO TABS
25.0000 mg | ORAL_TABLET | Freq: Every day | ORAL | Status: DC
  Administered 2023-02-21 – 2023-02-23 (×3): 25 mg via ORAL
  Filled 2023-02-21 (×3): qty 1

## 2023-02-21 MED ORDER — ENOXAPARIN SODIUM 100 MG/ML IJ SOSY
90.0000 mg | PREFILLED_SYRINGE | Freq: Two times a day (BID) | INTRAMUSCULAR | Status: DC
  Administered 2023-02-21 – 2023-02-23 (×5): 90 mg via SUBCUTANEOUS
  Filled 2023-02-21 (×5): qty 1

## 2023-02-21 MED ORDER — ROPINIROLE HCL 1 MG PO TABS: 0.5000 mg | ORAL_TABLET | Freq: Every evening | ORAL | Status: DC | PRN

## 2023-02-21 MED ORDER — LISINOPRIL-HYDROCHLOROTHIAZIDE 20-25 MG PO TABS: 1.0000 | ORAL_TABLET | Freq: Every day | ORAL | Status: DC

## 2023-02-21 MED ORDER — MELATONIN 5 MG PO TABS
10.0000 mg | ORAL_TABLET | Freq: Every day | ORAL | Status: DC
  Administered 2023-02-21 – 2023-02-22 (×3): 10 mg via ORAL
  Filled 2023-02-21 (×3): qty 2

## 2023-02-21 MED ORDER — ACETAMINOPHEN 325 MG PO TABS
650.0000 mg | ORAL_TABLET | Freq: Four times a day (QID) | ORAL | Status: DC | PRN
  Administered 2023-02-22: 650 mg via ORAL
  Filled 2023-02-21: qty 2

## 2023-02-21 MED ORDER — SENNOSIDES-DOCUSATE SODIUM 8.6-50 MG PO TABS: 1.0000 | ORAL_TABLET | Freq: Every evening | ORAL | Status: DC | PRN

## 2023-02-21 MED ORDER — AMLODIPINE BESYLATE 5 MG PO TABS
2.5000 mg | ORAL_TABLET | Freq: Every day | ORAL | Status: DC
  Administered 2023-02-21 – 2023-02-23 (×3): 2.5 mg via ORAL
  Filled 2023-02-21 (×3): qty 1

## 2023-02-21 MED ORDER — ACETAMINOPHEN 650 MG RE SUPP: 650.0000 mg | Freq: Four times a day (QID) | RECTAL | Status: DC | PRN

## 2023-02-21 MED ORDER — CLONAZEPAM 0.5 MG PO TABS
0.5000 mg | ORAL_TABLET | Freq: Every day | ORAL | Status: DC | PRN
  Administered 2023-02-21: 0.5 mg via ORAL
  Filled 2023-02-21: qty 1

## 2023-02-21 MED ORDER — IPRATROPIUM-ALBUTEROL 0.5-2.5 (3) MG/3ML IN SOLN
3.0000 mL | RESPIRATORY_TRACT | Status: DC
  Administered 2023-02-21 (×2): 3 mL via RESPIRATORY_TRACT
  Filled 2023-02-21 (×2): qty 3

## 2023-02-21 MED ORDER — IPRATROPIUM-ALBUTEROL 0.5-2.5 (3) MG/3ML IN SOLN
3.0000 mL | Freq: Two times a day (BID) | RESPIRATORY_TRACT | Status: DC
  Administered 2023-02-22 – 2023-02-23 (×3): 3 mL via RESPIRATORY_TRACT
  Filled 2023-02-21 (×3): qty 3

## 2023-02-21 MED ORDER — ONDANSETRON HCL 4 MG/2ML IJ SOLN: 4.0000 mg | Freq: Four times a day (QID) | INTRAMUSCULAR | Status: DC | PRN

## 2023-02-21 MED ORDER — IPRATROPIUM-ALBUTEROL 0.5-2.5 (3) MG/3ML IN SOLN
3.0000 mL | Freq: Four times a day (QID) | RESPIRATORY_TRACT | Status: DC
  Administered 2023-02-21 (×2): 3 mL via RESPIRATORY_TRACT
  Filled 2023-02-21 (×2): qty 3

## 2023-02-21 NOTE — Progress Notes (Signed)
ANTICOAGULATION CONSULT NOTE - Initial Consult  Pharmacy Consult for enoxaparin Indication: VTE treatment  Allergies  Allergen Reactions   Codeine Other (See Comments)    Skin crawls, jittery, agitated, rash   Prednisone Other (See Comments)    Tachycardia, light headed, rash    Patient Measurements: Height: 5\' 2"  (157.5 cm) Weight: 87.1 kg (192 lb) IBW/kg (Calculated) : 50.1 Heparin Dosing Weight:   Vital Signs: Temp: 98.2 F (36.8 C) (03/22 2313) Temp Source: Oral (03/22 2313) BP: 143/54 (03/22 2313) Pulse Rate: 91 (03/22 2313)  Labs: Recent Labs    02/20/23 1825 02/20/23 2218  HGB 10.6*  --   HCT 33.6*  --   PLT 268  --   CREATININE 1.04*  --   TROPONINIHS 7 6    Estimated Creatinine Clearance: 44.9 mL/min (A) (by C-G formula based on SCr of 1.04 mg/dL (H)).   Medical History: Past Medical History:  Diagnosis Date   Allergy    Angiodysplasia of cecum 12/23/2021   1-2 mm   Anxiety    Arthritis    Asthma    dx 10 yrs ago   Atrial fibrillation (HCC)    CHADS VASC score 3. Intolerant to flecainide. s/p afib ablation 02/12/12   Cataract    removed bilaterally   Colon polyps    Diastolic dysfunction    Dysrhythmia    a fib   dx 2014   GERD (gastroesophageal reflux disease)    History of kidney infection     last one was approx 2009   Hyperlipidemia    Hypertension    lung ca dx'd 08/2016   Mild sleep apnea    wears NO equipment   Mitral regurgitation    mild   Neuromuscular disorder (HCC)    Obesity    Plantar fasciitis    Restless leg syndrome    Sinus bradycardia    Sleep apnea    Stress bladder incontinence, female    WEARS SMALL PADS     Assessment: 82 yo female with hx of Afib on xarelto presented with SOB. CT showed acute PE, no heart strain.  Enoxaparin per pharmacy. Pt received a dose at [facility] before transferring to [facility].  Hgb 10.6, plts 268, CrCL 44.109mls/min   Goal of Therapy:  Anti-Xa level 0.6-1 units/ml 4hrs after LMWH dose  given Monitor platelets by anticoagulation protocol: Yes   Plan:  Enoxaparin 90mg  SQ q12h Check CrCl at least [place] 02/21/2023, 12:18 AM

## 2023-02-21 NOTE — Progress Notes (Signed)
Agree with previous RN's assessment. 

## 2023-02-21 NOTE — Plan of Care (Signed)
  Problem: Education: Goal: Knowledge of General Education information will improve Description: Including pain rating scale, medication(s)/side effects and non-pharmacologic comfort measures Outcome: Progressing   Problem: Clinical Measurements: Goal: Will remain free from infection Outcome: Progressing   Problem: Coping: Goal: Level of anxiety will decrease Outcome: Progressing   

## 2023-02-21 NOTE — Progress Notes (Signed)
Lower extremity venous and upper extremity venous bilateral study completed.   Please see CV Proc for preliminary results.   Darlin Coco, RDMS, RVT

## 2023-02-21 NOTE — Progress Notes (Signed)
Mobility Specialist - Progress Note   02/21/23 1106  Oxygen Therapy  SpO2 97 %  O2 Device Room Air  Patient Activity (if Appropriate) Ambulating  Mobility  Activity Ambulated independently in hallway  Level of Assistance Independent  Assistive Device None  Distance Ambulated (ft) 350 ft  Activity Response Tolerated well  Mobility Referral Yes  $Mobility charge 1 Mobility   Pt received in bed and agreeable to mobility. No complaints during session. Pt had one coughing spell upon returning to room, but stated she was okay. Pt to EOB after session with all needs met.    Pre-mobility: 88 HR, 97% SpO2 (RA) Post-mobility: 95 HR, 95% SPO2 (RA)  Set designer

## 2023-02-21 NOTE — H&P (Addendum)
PCP:   [name], MD   Chief Complaint:  Shortness of breath  HPI: This is a 82 year old female with past medical history of HTN, HLD, adenocarcinoma of lung, secondary hypercoagulable state, and atrial fibrillation maintained on Xarelto.  Per patient she has been coughing very hard for approximately a week.  It was a dry cough.  She could hardly breathe and started wheezing.  She denies fever, chills, nausea or vomiting.  She developed chest heaviness the last 3 days.  She decided to come to [facility] ER.  As stated patient maintained on Xarelto, she states she has been compliant.  In the ER CT chest revealed acute pulmonary embolus with tiny embolic burden. No CT evidence of right heart strain.  EDP contacted Dr. Marin Olp who recommended initiation of therapeutic dose Lovenox.  Review of Systems:  The patient denies anorexia, fever, weight loss,, vision loss, decreased hearing, hoarseness, chest pain, syncope, dyspnea on exertion, peripheral edema, balance deficits, hemoptysis, abdominal pain, melena, hematochezia, severe indigestion/heartburn, hematuria, incontinence, genital sores, muscle weakness, suspicious skin lesions, transient blindness, difficulty walking, depression, unusual weight change, abnormal bleeding, enlarged lymph nodes, angioedema, and breast masses. Positives: Cough, chest heaviness, wheezing, dyspnea  Past Medical History: Past Medical History:  Diagnosis Date   Allergy    Angiodysplasia of cecum 12/23/2021   1-2 mm   Anxiety    Arthritis    Asthma    dx 10 yrs ago   Atrial fibrillation (HCC)    CHADS VASC score 3. Intolerant to flecainide. s/p afib ablation 02/12/12   Cataract    removed bilaterally   Colon polyps    Diastolic dysfunction    Dysrhythmia    a fib   dx 2014   GERD (gastroesophageal reflux disease)    History of kidney infection     last one was approx 2009   Hyperlipidemia    Hypertension    lung ca dx'd 08/2016   Mild sleep  apnea    wears NO equipment   Mitral regurgitation    mild   Neuromuscular disorder (HCC)    Obesity    Plantar fasciitis    Restless leg syndrome    Sinus bradycardia    Sleep apnea    Stress bladder incontinence, female    WEARS SMALL PADS   Past Surgical History:  Procedure Laterality Date   ABDOMINAL HYSTERECTOMY  1992   BSO   APPENDECTOMY     atrial fibrillation ablation  02/12/12   PVI by Dr Rayann Heman   ATRIAL FIBRILLATION ABLATION N/A 02/12/2012   Procedure: ATRIAL FIBRILLATION ABLATION;  Surgeon: Thompson Grayer, MD;  Location: [facility] CATH LAB;  Service: Cardiovascular;  Laterality: N/A;   BLADDER SUSPENSION     CARPAL TUNNEL RELEASE Right 01/26/2020   Procedure: CARPAL TUNNEL RELEASE;  Surgeon: Daryll Brod, MD;  Location: [facility];  Service: Orthopedics;  Laterality: Right;   [place], 200,2004, 2007, 05/06/2011   adenomas   heels spurs     x 2   TEE WITHOUT CARDIOVERSION  02/11/2012   Procedure: TRANSESOPHAGEAL ECHOCARDIOGRAM (TEE);  Surgeon: Larey Dresser, MD;  Location: [place];  Service: Cardiovascular;  Laterality: N/A;   TONSILLECTOMY AND ADENOIDECTOMY     VARICOSE VEIN SURGERY     VIDEO ASSISTED THORACOSCOPY (VATS)/WEDGE RESECTION Right 10/02/2016   Procedure: VIDEO ASSISTED THORACOSCOPY (VATS)/WEDGE RESECTION;  Surgeon: [name], MD;  Location: [facility];  Service: Thoracic;  Laterality: Right;    Medications:  Prior to Admission medications   Medication Sig Start Date End Date Taking? Authorizing Provider  acetaminophen (TYLENOL) 500 MG tablet Take 1,000 mg by mouth daily as needed (pain).    [provider]  amLODipine (NORVASC) 2.5 MG tablet Take 2.5 mg by mouth daily. 05/01/22   [provider]  calcium carbonate (OS-CAL) 600 MG TABS Take 600 mg by mouth daily.    [provider]  Cholecalciferol (VITAMIN D PO) Take 4,000 Units by mouth daily.      [provider]  clonazePAM  (KLONOPIN) 0.5 MG tablet Take 0.25 mg by mouth daily as needed for anxiety.    [provider]  famotidine (PEPCID) 20 MG tablet Take 20 mg by mouth 2 (two) times daily.    [provider]  lisinopril-hydrochlorothiazide (ZESTORETIC) 20-25 MG tablet Take 1 tablet by mouth daily. 06/17/21   [provider]  lovastatin (MEVACOR) 40 MG tablet Take 80 mg by mouth daily.  09/11/12   [provider]  MAGNESIUM PO Take 250 mg by mouth daily.    [provider]  melatonin 5 MG TABS Take 10 mg by mouth at bedtime.    [provider]  nitroGLYCERIN (NITROSTAT) 0.4 MG SL tablet Place 1 tablet (0.4 mg total) under the tongue every 5 (five) minutes as needed for chest pain. Patient not taking: Reported on 10/14/2022 08/13/20   Allred, Jeneen Rinks, MD  polyethylene glycol (MIRALAX / GLYCOLAX) 17 g packet Take 17 g by mouth daily.    [provider]  potassium chloride SA (K-DUR,KLOR-CON) 20 MEQ tablet Take 20 mEq by mouth daily.    [provider]  senna (SENOKOT) 8.6 MG tablet Take 1 tablet by mouth daily.    [provider]  vitamin E 180 MG (400 UNITS) capsule Take 400 Units by mouth daily.     [provider]  XARELTO 20 MG TABS tablet Take 1 tablet (20 mg total) by mouth daily with supper. 03/14/22   Fenton, Clint R, PA    Allergies:   Allergies  Allergen Reactions   Codeine Other (See Comments)    Skin crawls, jittery, agitated, rash   Prednisone Other (See Comments)    Tachycardia, light headed, rash    Social History:  reports that she quit smoking about 37 years ago. Her smoking use included cigarettes. She has never used smokeless tobacco. She reports that she does not drink alcohol and does not use drugs.  Family History: Family History  Problem Relation Age of Onset   Congestive Heart Failure Mother    Lung cancer Father    Diabetes Brother    Hypertension Brother    Colon cancer Maternal Uncle    Colon  cancer Maternal Grandmother    Breast cancer Neg Hx    Esophageal cancer Neg Hx    Rectal cancer Neg Hx    Stomach cancer Neg Hx     Physical Exam: Vitals:   02/20/23 2110 02/20/23 2115 02/20/23 2212 02/20/23 2313  BP: (!) 152/62 (!) 149/60  (!) 143/54  Pulse: 97 95  91  Resp: 18 17  (!) 22  Temp: 97.8 F (36.6 C)  98 F (36.7 C) 98.2 F (36.8 C)  TempSrc: Oral  Oral Oral  SpO2: 100% 100%  97%  Weight:      Height:        General:  Alert and oriented times three, well developed and nourished, no acute distress Eyes: PERRLA, pink conjunctiva, no scleral  icterus ENT: Moist oral mucosa, neck supple, no thyromegaly Lungs: Generalized fine wheeze throughout, no crackles, no use of accessory muscles Cardiovascular: regular rate and rhythm, no regurgitation, no gallops, no murmurs. No carotid bruits, no JVD.  No reproducible chest wall pain Abdomen: soft, positive BS, non-tender, non-distended, no organomegaly, not an acute abdomen GU: not examined Neuro: CN II - XII grossly intact, sensation intact Musculoskeletal: strength 5/5 all extremities, no clubbing, cyanosis or edema Skin: no rash, no subcutaneous crepitation, no decubitus Psych: appropriate patient  Labs on Admission:  Recent Labs    02/20/23 1825  NA 138  K 4.0  CL 102  CO2 27  GLUCOSE 103*  BUN 31*  CREATININE 1.04*  CALCIUM 9.7    Micro Results: Recent Results (from the past 240 hour(s))  Resp panel by RT-PCR (RSV, Flu A&B, Covid) Nasopharyngeal Swab     Status: None   Collection Time: 02/20/23  6:07 PM   Specimen: Nasopharyngeal Swab; Nasal Swab  Result Value Ref Range Status   SARS Coronavirus 2 by RT PCR NEGATIVE NEGATIVE Final    Comment: (NOTE) SARS-CoV-2 target nucleic acids are NOT DETECTED.  The SARS-CoV-2 RNA is generally detectable in upper respiratory specimens during the acute phase of infection. The lowest concentration of SARS-CoV-2 viral copies this assay can detect is 138 copies/mL.  A negative result does not preclude SARS-Cov-2 infection and should not be used as the sole basis for treatment or other patient management decisions. A negative result may occur with  improper specimen collection/handling, submission of specimen other than nasopharyngeal swab, presence of viral mutation(s) within the areas targeted by this assay, and inadequate number of viral copies(<138 copies/mL). A negative result must be combined with clinical observations, patient history, and epidemiological information. The expected result is Negative.  Fact Sheet for Patients:  EntrepreneurPulse.com.au  Fact Sheet for Healthcare Providers:  IncredibleEmployment.be  This test is no t yet approved or cleared by the [place] FDA and  has been authorized for detection and/or diagnosis of SARS-CoV-2 by FDA under an Emergency Use Authorization (EUA). This EUA will remain  in effect (meaning this test can be used) for the duration of the COVID-19 declaration under Section 564(b)(1) of the Act, 21 U.S.C.section 360bbb-3(b)(1), unless the authorization is terminated  or revoked sooner.       Influenza A by PCR NEGATIVE NEGATIVE Final   Influenza B by PCR NEGATIVE NEGATIVE Final    Comment: (NOTE) The Xpert Xpress SARS-CoV-2/FLU/RSV plus assay is intended as an aid in the diagnosis of influenza from Nasopharyngeal swab specimens and should not be used as a sole basis for treatment. Nasal washings and aspirates are unacceptable for Xpert Xpress SARS-CoV-2/FLU/RSV testing.  Fact Sheet for Patients: EntrepreneurPulse.com.au  Fact Sheet for Healthcare Providers: IncredibleEmployment.be  This test is not yet approved or cleared by the [place] FDA and has been authorized for detection and/or diagnosis of SARS-CoV-2 by FDA under an Emergency Use Authorization (EUA). This EUA will remain in effect (meaning this test  can be used) for the duration of the COVID-19 declaration under Section 564(b)(1) of the Act, 21 U.S.C. section 360bbb-3(b)(1), unless the authorization is terminated or revoked.     Resp Syncytial Virus by PCR NEGATIVE NEGATIVE Final    Comment: (NOTE) Fact Sheet for Patients: EntrepreneurPulse.com.au  Fact Sheet for Healthcare Providers: IncredibleEmployment.be  This test is not yet approved or cleared by the [place] FDA and has been authorized for detection and/or diagnosis of SARS-CoV-2 by FDA  under an Emergency Use Authorization (EUA). This EUA will remain in effect (meaning this test can be used) for the duration of the COVID-19 declaration under Section 564(b)(1) of the Act, 21 U.S.C. section 360bbb-3(b)(1), unless the authorization is terminated or revoked.  Performed at [facility], [address], [place], [place] [address]      Radiological Exams on Admission: CT Angio Chest PE W/Cm &/Or Wo Cm  Result Date: 02/20/2023 CLINICAL DATA:  Lung cancer, dyspnea, productive cough EXAM: CT ANGIOGRAPHY CHEST WITH CONTRAST TECHNIQUE: Multidetector CT imaging of the chest was performed using the standard protocol during bolus administration of intravenous contrast. Multiplanar CT image reconstructions and MIPs were obtained to evaluate the vascular anatomy. RADIATION DOSE REDUCTION: This exam was performed according to the departmental dose-optimization program which includes automated exposure control, adjustment of the mA and/or kV according to patient size and/or use of iterative reconstruction technique. CONTRAST:  47mL OMNIPAQUE IOHEXOL 350 MG/ML SOLN COMPARISON:  10/10/2022 FINDINGS: Cardiovascular: Mild coronary artery calcification. Global cardiac size within normal limits. No pericardial effusion. Central pulmonary arteries are normal caliber. There is adequate opacification of the pulmonary arterial tree. A tiny  intraluminal filling defect is identified within the right lower lobar posterior segmental pulmonary arterial branch in keeping with acute pulmonary embolus. The embolic burden is tiny. There is no CT evidence of right heart strain. Moderate atherosclerotic calcification within the thoracic aorta. No aortic aneurysm. Mediastinum/Nodes: Visualized thyroid is unremarkable. No pathologic thoracic adenopathy. Esophagus is unremarkable. Small hiatal hernia. Lungs/Pleura: Right apical wedge resection again noted with mild residual scarring. Bibasilar scarring within the right middle lobe and lingula again noted. Ground-glass pulmonary nodules within the right lung and noncalcified 6 mm solid pulmonary nodule within the a left lower lobe are stable. Previously noted inflammatory appearing pulmonary nodule within the posterior basal left lower lobe has resolved. No new focal pulmonary nodules or infiltrates. No pneumothorax or pleural effusion. Central airways are widely patent. Upper Abdomen: No acute abnormality. Musculoskeletal: Osseous structures are age-appropriate. No acute bone abnormality. Review of the MIP images confirms the above findings. IMPRESSION: 1. Acute pulmonary embolus with tiny embolic burden. No CT evidence of right heart strain. 2. Mild coronary artery calcification. 3. Multiple stable ground-glass and solid pulmonary nodules. Continued attention on surveillance CT imaging is warranted. These results were called by telephone at the time of interpretation on 02/20/2023 at 8:18 pm to provider [facility] , who verbally acknowledged these results. Aortic Atherosclerosis (ICD10-I70.0). Electronically Signed   By: Fidela Salisbury M.D.   On: 02/20/2023 20:18   DG Chest Port 1 View  Result Date: 02/20/2023 CLINICAL DATA:  Shortness of breath. EXAM: PORTABLE CHEST 1 VIEW COMPARISON:  Chest radiograph dated 08/20/2022 and CT dated 10/10/2022. FINDINGS: Postsurgical changes of the right upper lobe and  surgical suture. Bibasilar densities, right greater left may represent combination of atelectasis/scarring or correspond to the scattered nodular densities in the right middle lobe seen on the prior CT. No pleural effusion pneumothorax. The cardiac silhouette is within normal limits. Atherosclerotic calcification of the aortic arch. No acute osseous pathology. Degenerative changes of the spine. IMPRESSION: 1. Bibasilar atelectasis/scarring. Infiltrate in the right lung base is not excluded. Clinical correlation is recommended. 2. Postsurgical changes of the right upper lobe. Electronically Signed   By: Anner Crete M.D.   On: 02/20/2023 18:58    Assessment/Plan Present on Admission:  Acute pulmonary embolism (HCC)/secondary hypercoagulable state d/t malignancy -Lovenox 1 mg/kg every 12 hours -Lower extremity Dopplers,  evaluate for DVT -2D echo   Bronchitis -Patient allergic to prednisone.  For now scheduled DuoNebs every 4 hours x 3 -As needed albuterol -Patient resting comfortably.  On room air   Adenocarcinoma of lung ([place]) -Per Dr. Marin Olp   Essential hypertension -Continue Norvasc, and Zestoretic.   Paroxysmal atrial fibrillation ([place]) -Xarelto DC'd.  Lovenox initiated -Not on medications for rate control.   Hyperlipidemia -Lovastatin resumed   RLS -Will try Requip  Nolon Yellin 02/21/2023, 12:07 AM

## 2023-02-21 NOTE — Progress Notes (Signed)
Echocardiogram 2D Echocardiogram has been performed.  Julie Gill 02/21/2023, 9:27 AM

## 2023-02-21 NOTE — Progress Notes (Signed)
PROGRESS NOTE    Julie Gill  [id number] DOB: 23-Jun-1941 DOA: 02/20/2023 PCP: [name], MD  Brief Narrative: 82 yo female with HTN, HLD, stage 1B adenocarcinoma of lung status post wedge resection of the RUL in 2017 and atrial fibrillation maintained on Xarelto.  Patient reports she has not missed any Xarelto.  She was admitted with shortness of breath and wheezing.  She is found to have acute pulmonary embolus with tiny embolic burden.  No CT evidence of right heart strain.  Echocardiogram and Dopplers of the lower extremities are pending. Patient has been started on Lovenox 1 mg/kg twice a day after discussing with oncology.  Not hypoxic on admit Lives with her elderly husband  Assessment & Plan:   Principal Problem:   Acute pulmonary embolism ([place]) Active Problems:   Paroxysmal atrial fibrillation ([facility])   Essential hypertension   Adenocarcinoma of lung ([facility])   Secondary hypercoagulable state ([place])    #1 acute embolism failed outpatient treatment with Xarelto in the setting of hypercoagulable state history of adenocarcinoma of the lungs patient is admitted with shortness of breath and wheezing and was found to have acute PE by CT chest.   Discussed with oncology continue Lovenox 1 mg/kg twice daily for a month and then 1.5 mg/kg daily Echo and lower extremity Dopplers are pending Staff to teach patient and husband how to give Lovenox shots. Consider PT and check ambulatory oxygen saturation Once lower extremity Doppler results are back.  #2 paroxysmal atrial fibrillation continue Lovenox  rate is controlled  Not on rate controlling medications.  #3 history of stage I adenocarcinoma status post wedge resection of the right upper lobe.  Follow-up with Dr. Julien Nordmann as an outpatient.    #4 history of essential hypertension on Norvasc and Zestoretic as an outpatient Continue same.  #5 hyperlipidemia on statin  #6 anemia of chronic disease hemoglobin 8.7 down from  10.6 yesterday no signs of active bleeding or evidence of bleeding ?  Hemodilution Check FOBT Recheck labs in am  Estimated body mass index is 35.12 kg/m as calculated from the following:   Height as of this encounter: 5\' 2"  (1.575 m).   Weight as of this encounter: 87.1 kg.  DVT prophylaxis: Lovenox  Code Status: Full code  family Communication: None at bedside Disposition Plan:  Status is: Inpatient Remains inpatient appropriate because: Acute PE workup pending   Consultants:  Oncology: By phone  Procedures: None Antimicrobials: None Subjective: Patient is sitting up by the side of the bed complaining of shortness of breath and wheezing  Objective: Vitals:   02/20/23 2313 02/21/23 0333 02/21/23 0545 02/21/23 0927  BP: (!) 143/54 139/62    Pulse: 91 81 67   Resp: (!) 22 (!) 22 18   Temp: 98.2 F (36.8 C) 97.9 F (36.6 C)    TempSrc: Oral Oral    SpO2: 97% 97% 99% 98%  Weight:      Height:        Intake/Output Summary (Last 24 hours) at 02/21/2023 0935 Last data filed at 02/21/2023 [id number] Gross per 24 hour  Intake 240 ml  Output 700 ml  Net -460 ml   Filed Weights   02/20/23 1757  Weight: 87.1 kg    Examination:  General exam: Appears in no acute distress  respiratory system: Wheezing to auscultation. Respiratory effort normal. Cardiovascular system: S1 & S2 heard, RRR. No JVD, murmurs, rubs, gallops or clicks. No pedal edema. Gastrointestinal system: Abdomen is nondistended, soft and nontender.  No organomegaly or masses felt. Normal bowel sounds heard. Central nervous system: Alert and oriented. No focal neurological deficits. Extremities: Trace edema bilaterally  skin: No rashes, lesions or ulcers Psychiatry: Judgement and insight appear normal. Mood & affect appropriate.     Data Reviewed: I have personally reviewed following labs and imaging studies  CBC: Recent Labs  Lab 02/20/23 1825 02/21/23 0418  WBC 8.4 7.1  NEUTROABS 5.9 5.4  HGB 10.6*  8.7*  HCT 33.6* 28.3*  MCV 92.3 95.0  PLT 268 [id number]   Basic Metabolic Panel: Recent Labs  Lab 02/20/23 1825 02/21/23 0418  NA 138 138  K 4.0 3.9  CL 102 105  CO2 27 25  GLUCOSE 103* 137*  BUN 31* 24*  CREATININE 1.04* 0.76  CALCIUM 9.7 8.7*   GFR: Estimated Creatinine Clearance: 58.4 mL/min (by C-G formula based on SCr of 0.76 mg/dL). Liver Function Tests: No results for input(s): "AST", "ALT", "ALKPHOS", "BILITOT", "PROT", "ALBUMIN" in the last 168 hours. No results for input(s): "LIPASE", "AMYLASE" in the last 168 hours. No results for input(s): "AMMONIA" in the last 168 hours. Coagulation Profile: No results for input(s): "INR", "PROTIME" in the last 168 hours. Cardiac Enzymes: No results for input(s): "CKTOTAL", "CKMB", "CKMBINDEX", "TROPONINI" in the last 168 hours. BNP (last 3 results) No results for input(s): "PROBNP" in the last 8760 hours. HbA1C: No results for input(s): "HGBA1C" in the last 72 hours. CBG: No results for input(s): "GLUCAP" in the last 168 hours. Lipid Profile: No results for input(s): "CHOL", "HDL", "LDLCALC", "TRIG", "CHOLHDL", "LDLDIRECT" in the last 72 hours. Thyroid Function Tests: No results for input(s): "TSH", "T4TOTAL", "FREET4", "T3FREE", "THYROIDAB" in the last 72 hours. Anemia Panel: No results for input(s): "VITAMINB12", "FOLATE", "FERRITIN", "TIBC", "IRON", "RETICCTPCT" in the last 72 hours. Sepsis Labs: No results for input(s): "PROCALCITON", "LATICACIDVEN" in the last 168 hours.  Recent Results (from the past 240 hour(s))  Resp panel by RT-PCR (RSV, Flu A&B, Covid) Nasopharyngeal Swab     Status: None   Collection Time: 02/20/23  6:07 PM   Specimen: Nasopharyngeal Swab; Nasal Swab  Result Value Ref Range Status   SARS Coronavirus 2 by RT PCR NEGATIVE NEGATIVE Final    Comment: (NOTE) SARS-CoV-2 target nucleic acids are NOT DETECTED.  The SARS-CoV-2 RNA is generally detectable in upper respiratory specimens during the acute  phase of infection. The lowest concentration of SARS-CoV-2 viral copies this assay can detect is 138 copies/mL. A negative result does not preclude SARS-Cov-2 infection and should not be used as the sole basis for treatment or other patient management decisions. A negative result may occur with  improper specimen collection/handling, submission of specimen other than nasopharyngeal swab, presence of viral mutation(s) within the areas targeted by this assay, and inadequate number of viral copies(<138 copies/mL). A negative result must be combined with clinical observations, patient history, and epidemiological information. The expected result is Negative.  Fact Sheet for Patients:  EntrepreneurPulse.com.au  Fact Sheet for Healthcare Providers:  IncredibleEmployment.be  This test is no t yet approved or cleared by the [place] FDA and  has been authorized for detection and/or diagnosis of SARS-CoV-2 by FDA under an Emergency Use Authorization (EUA). This EUA will remain  in effect (meaning this test can be used) for the duration of the COVID-19 declaration under Section 564(b)(1) of the Act, 21 U.S.C.section 360bbb-3(b)(1), unless the authorization is terminated  or revoked sooner.       Influenza A by PCR NEGATIVE NEGATIVE Final  Influenza B by PCR NEGATIVE NEGATIVE Final    Comment: (NOTE) The Xpert Xpress SARS-CoV-2/FLU/RSV plus assay is intended as an aid in the diagnosis of influenza from Nasopharyngeal swab specimens and should not be used as a sole basis for treatment. Nasal washings and aspirates are unacceptable for Xpert Xpress SARS-CoV-2/FLU/RSV testing.  Fact Sheet for Patients: EntrepreneurPulse.com.au  Fact Sheet for Healthcare Providers: IncredibleEmployment.be  This test is not yet approved or cleared by the [place] FDA and has been authorized for detection and/or diagnosis of  SARS-CoV-2 by FDA under an Emergency Use Authorization (EUA). This EUA will remain in effect (meaning this test can be used) for the duration of the COVID-19 declaration under Section 564(b)(1) of the Act, 21 U.S.C. section 360bbb-3(b)(1), unless the authorization is terminated or revoked.     Resp Syncytial Virus by PCR NEGATIVE NEGATIVE Final    Comment: (NOTE) Fact Sheet for Patients: EntrepreneurPulse.com.au  Fact Sheet for Healthcare Providers: IncredibleEmployment.be  This test is not yet approved or cleared by the [place] FDA and has been authorized for detection and/or diagnosis of SARS-CoV-2 by FDA under an Emergency Use Authorization (EUA). This EUA will remain in effect (meaning this test can be used) for the duration of the COVID-19 declaration under Section 564(b)(1) of the Act, 21 U.S.C. section 360bbb-3(b)(1), unless the authorization is terminated or revoked.  Performed at [facility], [address], [place],  [address]          Radiology Studies: CT Angio Chest PE W/Cm &/Or Wo Cm  Result Date: 02/20/2023 CLINICAL DATA:  Lung cancer, dyspnea, productive cough EXAM: CT ANGIOGRAPHY CHEST WITH CONTRAST TECHNIQUE: Multidetector CT imaging of the chest was performed using the standard protocol during bolus administration of intravenous contrast. Multiplanar CT image reconstructions and MIPs were obtained to evaluate the vascular anatomy. RADIATION DOSE REDUCTION: This exam was performed according to the departmental dose-optimization program which includes automated exposure control, adjustment of the mA and/or kV according to patient size and/or use of iterative reconstruction technique. CONTRAST:  26mL OMNIPAQUE IOHEXOL 350 MG/ML SOLN COMPARISON:  10/10/2022 FINDINGS: Cardiovascular: Mild coronary artery calcification. Global cardiac size within normal limits. No pericardial effusion. Central  pulmonary arteries are normal caliber. There is adequate opacification of the pulmonary arterial tree. A tiny intraluminal filling defect is identified within the right lower lobar posterior segmental pulmonary arterial branch in keeping with acute pulmonary embolus. The embolic burden is tiny. There is no CT evidence of right heart strain. Moderate atherosclerotic calcification within the thoracic aorta. No aortic aneurysm. Mediastinum/Nodes: Visualized thyroid is unremarkable. No pathologic thoracic adenopathy. Esophagus is unremarkable. Small hiatal hernia. Lungs/Pleura: Right apical wedge resection again noted with mild residual scarring. Bibasilar scarring within the right middle lobe and lingula again noted. Ground-glass pulmonary nodules within the right lung and noncalcified 6 mm solid pulmonary nodule within the a left lower lobe are stable. Previously noted inflammatory appearing pulmonary nodule within the posterior basal left lower lobe has resolved. No new focal pulmonary nodules or infiltrates. No pneumothorax or pleural effusion. Central airways are widely patent. Upper Abdomen: No acute abnormality. Musculoskeletal: Osseous structures are age-appropriate. No acute bone abnormality. Review of the MIP images confirms the above findings. IMPRESSION: 1. Acute pulmonary embolus with tiny embolic burden. No CT evidence of right heart strain. 2. Mild coronary artery calcification. 3. Multiple stable ground-glass and solid pulmonary nodules. Continued attention on surveillance CT imaging is warranted. These results were called by telephone at the time of interpretation  on 02/20/2023 at 8:18 pm to provider [facility] , who verbally acknowledged these results. Aortic Atherosclerosis (ICD10-I70.0). Electronically Signed   By: Fidela Salisbury M.D.   On: 02/20/2023 20:18   DG Chest Port 1 View  Result Date: 02/20/2023 CLINICAL DATA:  Shortness of breath. EXAM: PORTABLE CHEST 1 VIEW COMPARISON:  Chest  radiograph dated 08/20/2022 and CT dated 10/10/2022. FINDINGS: Postsurgical changes of the right upper lobe and surgical suture. Bibasilar densities, right greater left may represent combination of atelectasis/scarring or correspond to the scattered nodular densities in the right middle lobe seen on the prior CT. No pleural effusion pneumothorax. The cardiac silhouette is within normal limits. Atherosclerotic calcification of the aortic arch. No acute osseous pathology. Degenerative changes of the spine. IMPRESSION: 1. Bibasilar atelectasis/scarring. Infiltrate in the right lung base is not excluded. Clinical correlation is recommended. 2. Postsurgical changes of the right upper lobe. Electronically Signed   By: Anner Crete M.D.   On: 02/20/2023 18:58        Scheduled Meds:  amLODipine  2.5 mg Oral Daily   enoxaparin (LOVENOX) injection  90 mg Subcutaneous Q12H   famotidine  20 mg Oral BID   lisinopril  20 mg Oral Daily   And   hydrochlorothiazide  25 mg Oral Daily   ipratropium-albuterol  3 mL Nebulization Q6H   melatonin  10 mg Oral QHS   potassium chloride SA  20 mEq Oral Daily   pravastatin  10 mg Oral q1800   Continuous Infusions:   LOS: 1 day    Time spent: 38 minutes  Georgette Shell, MD  02/21/2023, 9:35 AM

## 2023-02-22 DIAGNOSIS — I2699 Other pulmonary embolism without acute cor pulmonale: Secondary | ICD-10-CM | POA: Diagnosis not present

## 2023-02-22 LAB — COMPREHENSIVE METABOLIC PANEL
ALT: 18 U/L (ref 0–44)
AST: 20 U/L (ref 15–41)
Albumin: 3.3 g/dL — ABNORMAL LOW (ref 3.5–5.0)
Alkaline Phosphatase: 72 U/L (ref 38–126)
Anion gap: 8 (ref 5–15)
BUN: 19 mg/dL (ref 8–23)
CO2: 26 mmol/L (ref 22–32)
Calcium: 9 mg/dL (ref 8.9–10.3)
Chloride: 105 mmol/L (ref 98–111)
Creatinine, Ser: 0.98 mg/dL (ref 0.44–1.00)
GFR, Estimated: 59 mL/min — ABNORMAL LOW (ref >60)
Glucose, Bld: 103 mg/dL — ABNORMAL HIGH (ref 70–99)
Potassium: 4.2 mmol/L (ref 3.5–5.1)
Sodium: 139 mmol/L (ref 135–145)
Total Bilirubin: 0.3 mg/dL (ref 0.3–1.2)
Total Protein: 6 g/dL — ABNORMAL LOW (ref 6.5–8.1)

## 2023-02-22 LAB — CBC
HCT: 29.5 % — ABNORMAL LOW (ref 36.0–46.0)
Hemoglobin: 9 g/dL — ABNORMAL LOW (ref 12.0–15.0)
MCH: 29 pg (ref 26.0–34.0)
MCHC: 30.5 g/dL (ref 30.0–36.0)
MCV: 95.2 fL (ref 80.0–100.0)
Platelets: 208 10*3/uL (ref 150–400)
RBC: 3.1 MIL/uL — ABNORMAL LOW (ref 3.87–5.11)
RDW: 14.3 % (ref 11.5–15.5)
WBC: 5.9 10*3/uL (ref 4.0–10.5)
nRBC: 0 % (ref 0.0–0.2)

## 2023-02-22 LAB — GLUCOSE, CAPILLARY
Glucose-Capillary: 105 mg/dL — ABNORMAL HIGH (ref 70–99)
Glucose-Capillary: 127 mg/dL — ABNORMAL HIGH (ref 70–99)

## 2023-02-22 LAB — MAGNESIUM: Magnesium: 2.2 mg/dL (ref 1.7–2.4)

## 2023-02-22 MED ORDER — METHOCARBAMOL 500 MG PO TABS
500.0000 mg | ORAL_TABLET | Freq: Three times a day (TID) | ORAL | Status: DC | PRN
  Administered 2023-02-22: 500 mg via ORAL
  Filled 2023-02-22: qty 1

## 2023-02-22 MED ORDER — GUAIFENESIN 100 MG/5ML PO LIQD
5.0000 mL | ORAL | Status: DC | PRN
  Administered 2023-02-23 (×2): 5 mL via ORAL
  Filled 2023-02-22 (×3): qty 10

## 2023-02-22 NOTE — Progress Notes (Signed)
Patient had complaints of intermittent cramps on both legs. Nurse on duty massage lightly her legs and put some hot packs. Patient said it helped. Abigail Butts NP was made aware of this.

## 2023-02-22 NOTE — Plan of Care (Signed)
  Problem: Education: Goal: Knowledge of General Education information will improve Description: Including pain rating scale, medication(s)/side effects and non-pharmacologic comfort measures Outcome: Progressing   Problem: Clinical Measurements: Goal: Will remain free from infection Outcome: Progressing   Problem: Coping: Goal: Level of anxiety will decrease Outcome: Progressing   

## 2023-02-22 NOTE — Evaluation (Signed)
Physical Therapy Evaluation-1x Patient Details Name: Julie Gill MRN: [id number] DOB: [birth year] Today's Date: 02/22/2023  History of Present Illness  82 yo female admitted with acute PE. Also now c/o cramping in bil hands/arms and feet. Hx of lung Ca, Afib, anemia  Clinical Impression  On eval, pt is Mod I/Ind with mobility. She has been mobilizing in the room unassisted and without difficulty. She was able to demonstrate that to me this morning. She has already walked the unit with mobility team. O2>90% on RA during session. Did not note any dyspnea with activity during my session. Patient's main complaint is new onset of muscle cramping in her hands/arms/feet-pt reports nursing and MD are aware. Encouraged pt to try ROM/stretching and to continue mobilizing as tolerated. No acute PT needs. 1x eval. Will sign off.        Recommendations for follow up therapy are one component of a multi-disciplinary discharge planning process, led by the attending physician.  Recommendations may be updated based on patient status, additional functional criteria and insurance authorization.  Follow Up Recommendations No PT follow up      Assistance Recommended at Discharge None  Patient can return home with the following       Equipment Recommendations None recommended by PT  Recommendations for Other Services       Functional Status Assessment Patient has not had a recent decline in their functional status     Precautions / Restrictions Restrictions Weight Bearing Restrictions: No      Mobility  Bed Mobility Overal bed mobility: Modified Independent                  Transfers Overall transfer level: Modified independent                      Ambulation/Gait Ambulation/Gait assistance: Modified independent (Device/Increase time)   Assistive device: None         General Gait Details: walked around room a short distance. limited due to pt c/o of discomfort from  cramping  Stairs            Wheelchair Mobility    Modified Rankin (Stroke Patients Only)       Balance Overall balance assessment: No apparent balance deficits (not formally assessed)                                           Pertinent Vitals/Pain Pain Assessment Pain Assessment: Faces Faces Pain Scale: Hurts a little bit Pain Location: hands/arms/feet Pain Descriptors / Indicators: Cramping Pain Intervention(s): Monitored during session    Home Living Family/patient expects to be discharged to:: Private residence Living Arrangements: Spouse/significant other Available Help at Discharge: Family Type of Home: House Home Access: Stairs to enter   Technical brewer of Steps: 3     Home Equipment: None      Prior Function Prior Level of Function : Independent/Modified Independent                     Hand Dominance        Extremity/Trunk Assessment   Upper Extremity Assessment Upper Extremity Assessment: Overall WFL for tasks assessed    Lower Extremity Assessment Lower Extremity Assessment: Overall WFL for tasks assessed    Cervical / Trunk Assessment Cervical / Trunk Assessment: Normal  Communication   Communication: No difficulties  Cognition Arousal/Alertness:  Awake/alert Behavior During Therapy: WFL for tasks assessed/performed Overall Cognitive Status: Within Functional Limits for tasks assessed                                          General Comments      Exercises     Assessment/Plan    PT Assessment Patient does not need any further PT services  PT Problem List         PT Treatment Interventions      PT Goals (Current goals can be found in the Care Plan section)  Acute Rehab PT Goals Patient Stated Goal: to get some relief/find cause of cramping PT Goal Formulation: All assessment and education complete, DC therapy    Frequency       Co-evaluation                AM-PAC PT "6 Clicks" Mobility  Outcome Measure Help needed turning from your back to your side while in a flat bed without using bedrails?: None Help needed moving from lying on your back to sitting on the side of a flat bed without using bedrails?: None Help needed moving to and from a bed to a chair (including a wheelchair)?: None Help needed standing up from a chair using your arms (e.g., wheelchair or bedside chair)?: None Help needed to walk in hospital room?: None Help needed climbing 3-5 steps with a railing? : None 6 Click Score: 24    End of Session   Activity Tolerance: Patient tolerated treatment well Patient left: in bed;with call bell/phone within reach;with nursing/sitter in room        Time: [id number] PT Time Calculation (min) (ACUTE ONLY): 10 min   Charges:   PT Evaluation $PT Eval Low Complexity: 1 Low             Doreatha Massed, PT Acute Rehabilitation  Office: [phone]

## 2023-02-22 NOTE — Progress Notes (Signed)
PROGRESS NOTE    Julie Gill  [id number] DOB: 1941-08-07 DOA: 02/20/2023 PCP: [name], MD  Brief Narrative: 82 yo female with HTN, HLD, stage 1B adenocarcinoma of lung status post wedge resection of the RUL in 2017 and atrial fibrillation maintained on Xarelto.  Patient reports she has not missed any Xarelto.  She was admitted with shortness of breath and wheezing.  She is found to have acute pulmonary embolus with tiny embolic burden.  No CT evidence of right heart strain.  Echocardiogram and Dopplers of the lower extremities are pending. Patient has been started on Lovenox 1 mg/kg twice a day after discussing with oncology.  Not hypoxic on admit Lives with her elderly husband  Assessment & Plan:   Principal Problem:   Acute pulmonary embolism ([place]) Active Problems:   Paroxysmal atrial fibrillation ([facility])   Essential hypertension   Adenocarcinoma of lung ([facility])   Secondary hypercoagulable state ([place])    #1 acute embolism failed outpatient treatment with Xarelto in the setting of hypercoagulable state history of adenocarcinoma of the lungs patient is admitted with shortness of breath and wheezing and was found to have acute PE by CT chest.   Discussed with oncology continue Lovenox 1 mg/kg twice daily for a month and then 1.5 mg/kg daily lower extremity and upper extremity Dopplers negative for DVT.   Staff to teach patient and husband how to give Lovenox shots. Will check ambulatory oxygen saturation.    #2 paroxysmal atrial fibrillation continue Lovenox  rate is controlled  Not on rate controlling medications.  #3 history of stage I adenocarcinoma status post wedge resection of the right upper lobe.  Follow-up with Dr. Julien Nordmann as an outpatient.    #4 history of essential hypertension on Norvasc and Zestoretic as an outpatient Continue same.  #5 hyperlipidemia on statin  #6 anemia of chronic disease hemoglobin 9. FOBT ordered not done yet   Estimated body mass  index is 35.12 kg/m as calculated from the following:   Height as of this encounter: 5\' 2"  (1.575 m).   Weight as of this encounter: 87.1 kg.  DVT prophylaxis: Lovenox  Code Status: Full code  family Communication: None at bedside Disposition Plan:  Status is: Inpatient Remains inpatient appropriate because: Acute PE workup pending   Consultants:  Oncology: By phone  Procedures: None Antimicrobials: None Subjective: Feels weak tired complaining of cramps in both lower extremities has not had a bowel movement Complaining of abdominal cramps  Staff to teach her Lovenox shots today.  objective: Vitals:   02/21/23 2058 02/22/23 0533 02/22/23 0547 02/22/23 0959  BP: (!) 141/53 (!) 137/57  (!) 121/56  Pulse: 90 73  89  Resp: 16 16    Temp: 98.9 F (37.2 C) 98.1 F (36.7 C)    TempSrc: Oral Oral    SpO2: 96% 98% 98%   Weight:      Height:        Intake/Output Summary (Last 24 hours) at 02/22/2023 1115 Last data filed at 02/22/2023 0900 Gross per 24 hour  Intake 410 ml  Output 1400 ml  Net -990 ml    Filed Weights   02/20/23 1757  Weight: 87.1 kg    Examination:  General exam: Appears in no acute distress  respiratory system: Wheezing to auscultation. Respiratory effort normal. Cardiovascular system: S1 & S2 heard, RRR. No JVD, murmurs, rubs, gallops or clicks. No pedal edema. Gastrointestinal system: Abdomen is nondistended, soft and nontender. No organomegaly or masses felt. Normal bowel  sounds heard. Central nervous system: Alert and oriented. No focal neurological deficits. Extremities: Trace edema bilaterally  skin: No rashes, lesions or ulcers Psychiatry: Judgement and insight appear normal. Mood & affect appropriate.     Data Reviewed: I have personally reviewed following labs and imaging studies  CBC: Recent Labs  Lab 02/20/23 1825 02/21/23 0418 02/22/23 0412  WBC 8.4 7.1 5.9  NEUTROABS 5.9 5.4  --   HGB 10.6* 8.7* 9.0*  HCT 33.6* 28.3* 29.5*   MCV 92.3 95.0 95.2  PLT 268 211 [id number]    Basic Metabolic Panel: Recent Labs  Lab 02/20/23 1825 02/21/23 0418 02/22/23 0412  NA 138 138 139  K 4.0 3.9 4.2  CL 102 105 105  CO2 27 25 26   GLUCOSE 103* 137* 103*  BUN 31* 24* 19  CREATININE 1.04* 0.76 0.98  CALCIUM 9.7 8.7* 9.0  MG  --   --  2.2    GFR: Estimated Creatinine Clearance: 47.7 mL/min (by C-G formula based on SCr of 0.98 mg/dL). Liver Function Tests: Recent Labs  Lab 02/22/23 0412  AST 20  ALT 18  ALKPHOS 72  BILITOT 0.3  PROT 6.0*  ALBUMIN 3.3*   No results for input(s): "LIPASE", "AMYLASE" in the last 168 hours. No results for input(s): "AMMONIA" in the last 168 hours. Coagulation Profile: No results for input(s): "INR", "PROTIME" in the last 168 hours. Cardiac Enzymes: No results for input(s): "CKTOTAL", "CKMB", "CKMBINDEX", "TROPONINI" in the last 168 hours. BNP (last 3 results) No results for input(s): "PROBNP" in the last 8760 hours. HbA1C: No results for input(s): "HGBA1C" in the last 72 hours. CBG: Recent Labs  Lab 02/22/23 0738  GLUCAP 105*   Lipid Profile: No results for input(s): "CHOL", "HDL", "LDLCALC", "TRIG", "CHOLHDL", "LDLDIRECT" in the last 72 hours. Thyroid Function Tests: No results for input(s): "TSH", "T4TOTAL", "FREET4", "T3FREE", "THYROIDAB" in the last 72 hours. Anemia Panel: No results for input(s): "VITAMINB12", "FOLATE", "FERRITIN", "TIBC", "IRON", "RETICCTPCT" in the last 72 hours. Sepsis Labs: No results for input(s): "PROCALCITON", "LATICACIDVEN" in the last 168 hours.  Recent Results (from the past 240 hour(s))  Resp panel by RT-PCR (RSV, Flu A&B, Covid) Nasopharyngeal Swab     Status: None   Collection Time: 02/20/23  6:07 PM   Specimen: Nasopharyngeal Swab; Nasal Swab  Result Value Ref Range Status   SARS Coronavirus 2 by RT PCR NEGATIVE NEGATIVE Final    Comment: (NOTE) SARS-CoV-2 target nucleic acids are NOT DETECTED.  The SARS-CoV-2 RNA is generally  detectable in upper respiratory specimens during the acute phase of infection. The lowest concentration of SARS-CoV-2 viral copies this assay can detect is 138 copies/mL. A negative result does not preclude SARS-Cov-2 infection and should not be used as the sole basis for treatment or other patient management decisions. A negative result may occur with  improper specimen collection/handling, submission of specimen other than nasopharyngeal swab, presence of viral mutation(s) within the areas targeted by this assay, and inadequate number of viral copies(<138 copies/mL). A negative result must be combined with clinical observations, patient history, and epidemiological information. The expected result is Negative.  Fact Sheet for Patients:  EntrepreneurPulse.com.au  Fact Sheet for Healthcare Providers:  IncredibleEmployment.be  This test is no t yet approved or cleared by the [place] FDA and  has been authorized for detection and/or diagnosis of SARS-CoV-2 by FDA under an Emergency Use Authorization (EUA). This EUA will remain  in effect (meaning this test can be used) for the duration of  the COVID-19 declaration under Section 564(b)(1) of the Act, 21 U.S.C.section 360bbb-3(b)(1), unless the authorization is terminated  or revoked sooner.       Influenza A by PCR NEGATIVE NEGATIVE Final   Influenza B by PCR NEGATIVE NEGATIVE Final    Comment: (NOTE) The Xpert Xpress SARS-CoV-2/FLU/RSV plus assay is intended as an aid in the diagnosis of influenza from Nasopharyngeal swab specimens and should not be used as a sole basis for treatment. Nasal washings and aspirates are unacceptable for Xpert Xpress SARS-CoV-2/FLU/RSV testing.  Fact Sheet for Patients: EntrepreneurPulse.com.au  Fact Sheet for Healthcare Providers: IncredibleEmployment.be  This test is not yet approved or cleared by the [place] FDA  and has been authorized for detection and/or diagnosis of SARS-CoV-2 by FDA under an Emergency Use Authorization (EUA). This EUA will remain in effect (meaning this test can be used) for the duration of the COVID-19 declaration under Section 564(b)(1) of the Act, 21 U.S.C. section 360bbb-3(b)(1), unless the authorization is terminated or revoked.     Resp Syncytial Virus by PCR NEGATIVE NEGATIVE Final    Comment: (NOTE) Fact Sheet for Patients: EntrepreneurPulse.com.au  Fact Sheet for Healthcare Providers: IncredibleEmployment.be  This test is not yet approved or cleared by the [place] FDA and has been authorized for detection and/or diagnosis of SARS-CoV-2 by FDA under an Emergency Use Authorization (EUA). This EUA will remain in effect (meaning this test can be used) for the duration of the COVID-19 declaration under Section 564(b)(1) of the Act, 21 U.S.C. section 360bbb-3(b)(1), unless the authorization is terminated or revoked.  Performed at [facility], [address] [place], [place] [address]          Radiology Studies: VAS [place] UPPER EXTREMITY VENOUS DUPLEX  Result Date: 02/21/2023 UPPER VENOUS STUDY  Patient Name:  YOANNA TIMPONE Dunckel  Date of Exam:   02/21/2023 Medical Rec #: [id number]       Accession #:    [id number] Date of Birth: 02-02-1941       Patient Gender: F Patient Age:   39 years Exam Location:  [facility] Procedure:      VAS [place] UPPER EXTREMITY VENOUS DUPLEX Referring Phys: Benjamine Mola Lilee Aldea --------------------------------------------------------------------------------  Indications: Cramping in bilateral arms x1 day, pulmonary embolism Comparison Study: No prior studies. Performing Technologist: Darlin Coco RDMS, RVT  Examination Guidelines: A complete evaluation includes B-mode imaging, spectral Doppler, color Doppler, and power Doppler as needed of all accessible portions of each vessel.  Bilateral testing is considered an integral part of a complete examination. Limited examinations for reoccurring indications may be performed as noted.  Right Findings: +----------+------------+---------+-----------+----------+-------+ RIGHT     CompressiblePhasicitySpontaneousPropertiesSummary +----------+------------+---------+-----------+----------+-------+ IJV           Full       Yes       Yes                      +----------+------------+---------+-----------+----------+-------+ Subclavian               Yes       Yes                      +----------+------------+---------+-----------+----------+-------+ Axillary      Full       Yes       Yes                      +----------+------------+---------+-----------+----------+-------+ Brachial      Full                                          +----------+------------+---------+-----------+----------+-------+  Radial        Full                                          +----------+------------+---------+-----------+----------+-------+ Ulnar         Full                                          +----------+------------+---------+-----------+----------+-------+ Cephalic      Full                                          +----------+------------+---------+-----------+----------+-------+ Basilic       Full                                          +----------+------------+---------+-----------+----------+-------+  Left Findings: +----------+------------+---------+-----------+----------+-------+ LEFT      CompressiblePhasicitySpontaneousPropertiesSummary +----------+------------+---------+-----------+----------+-------+ IJV           Full       Yes       Yes                      +----------+------------+---------+-----------+----------+-------+ Subclavian               Yes       Yes                      +----------+------------+---------+-----------+----------+-------+ Axillary      Full        Yes       Yes                      +----------+------------+---------+-----------+----------+-------+ Brachial      Full                                          +----------+------------+---------+-----------+----------+-------+ Radial        Full                                          +----------+------------+---------+-----------+----------+-------+ Ulnar         Full                                          +----------+------------+---------+-----------+----------+-------+ Cephalic      Full                                          +----------+------------+---------+-----------+----------+-------+ Basilic       Full                                          +----------+------------+---------+-----------+----------+-------+  Summary:  Right: No evidence of deep vein thrombosis in the upper extremity. No evidence of superficial vein thrombosis in the upper extremity.  Left: No evidence of deep vein thrombosis in the upper extremity. No evidence of superficial vein thrombosis in the upper extremity.  *See table(s) above for measurements and observations.    Preliminary    VAS [place] LOWER EXTREMITY VENOUS (DVT)  Result Date: 02/21/2023  Lower Venous DVT Study Patient Name:  DANAE TUCCILLO Killings  Date of Exam:   02/21/2023 Medical Rec #: [id number]       Accession #:    [id number] Date of Birth: 08/18/1941       Patient Gender: F Patient Age:   31 years Exam Location:  [facility] Procedure:      VAS [place] LOWER EXTREMITY VENOUS (DVT) Referring Phys: DEBBY CROSLEY --------------------------------------------------------------------------------  Indications: Pulmonary embolism. Other Indications: Varicosities, LT>RT, patient endorses pain/cramping that is                    worse when lying down. Comparison Study: No prior studies. Performing Technologist: Darlin Coco RDMS, RVT  Examination Guidelines: A complete evaluation includes B-mode imaging, spectral Doppler, color  Doppler, and power Doppler as needed of all accessible portions of each vessel. Bilateral testing is considered an integral part of a complete examination. Limited examinations for reoccurring indications may be performed as noted. The reflux portion of the exam is performed with the patient in reverse Trendelenburg.  +---------+---------------+---------+-----------+----------+--------------+ RIGHT    CompressibilityPhasicitySpontaneityPropertiesThrombus Aging +---------+---------------+---------+-----------+----------+--------------+ CFV      Full           Yes      Yes                                 +---------+---------------+---------+-----------+----------+--------------+ SFJ      Full                                                        +---------+---------------+---------+-----------+----------+--------------+ FV Prox  Full                                                        +---------+---------------+---------+-----------+----------+--------------+ FV Mid   Full                                                        +---------+---------------+---------+-----------+----------+--------------+ FV DistalFull                                                        +---------+---------------+---------+-----------+----------+--------------+ PFV      Full                                                        +---------+---------------+---------+-----------+----------+--------------+  POP      Full           Yes      Yes                                 +---------+---------------+---------+-----------+----------+--------------+ PTV      Full                                                        +---------+---------------+---------+-----------+----------+--------------+ PERO     Full                                                        +---------+---------------+---------+-----------+----------+--------------+ Gastroc  Full                                                         +---------+---------------+---------+-----------+----------+--------------+   +---------+---------------+---------+-----------+----------+--------------+ LEFT     CompressibilityPhasicitySpontaneityPropertiesThrombus Aging +---------+---------------+---------+-----------+----------+--------------+ CFV      Full           Yes      Yes                                 +---------+---------------+---------+-----------+----------+--------------+ SFJ      Full                                                        +---------+---------------+---------+-----------+----------+--------------+ FV Prox  Full                                                        +---------+---------------+---------+-----------+----------+--------------+ FV Mid   Full                                                        +---------+---------------+---------+-----------+----------+--------------+ FV DistalFull                                                        +---------+---------------+---------+-----------+----------+--------------+ PFV      Full                                                        +---------+---------------+---------+-----------+----------+--------------+  POP      Full           Yes      Yes                                 +---------+---------------+---------+-----------+----------+--------------+ PTV      Full                                                        +---------+---------------+---------+-----------+----------+--------------+ PERO     Full                                                        +---------+---------------+---------+-----------+----------+--------------+ Gastroc  Full                                                        +---------+---------------+---------+-----------+----------+--------------+    Summary: RIGHT: - There is no evidence of deep vein thrombosis in the lower  extremity.  - No cystic structure found in the popliteal fossa.  LEFT: - There is no evidence of deep vein thrombosis in the lower extremity.  - No cystic structure found in the popliteal fossa.  *See table(s) above for measurements and observations.    Preliminary    ECHOCARDIOGRAM COMPLETE  Result Date: 02/21/2023    ECHOCARDIOGRAM REPORT   Patient Name:   DAVINAH ALSBURY Imran Date of Exam: 02/21/2023 Medical Rec #:  [id number]      Height:       62.0 in Accession #:    [id number]     Weight:       192.0 lb Date of Birth:  05-Dec-1940      BSA:          1.879 m Patient Age:    53 years       BP:           132/62 mmHg Patient Gender: F              HR:           87 bpm. Exam Location:  Inpatient Procedure: 2D Echo, Cardiac Doppler and Color Doppler Indications:    I26.02 Pulmonary embolus  History:        Patient has prior history of Echocardiogram examinations.                 Arrythmias:Atrial Fibrillation; Risk Factors:Hypertension and                 Dyslipidemia.  Sonographer:    Phineas Douglas Referring Phys: [phone] DEBBY CROSLEY IMPRESSIONS  1. Left ventricular ejection fraction, by estimation, is 60 to 65%. The left ventricle has normal function. The left ventricle has no regional wall motion abnormalities. There is moderate left ventricular hypertrophy. Left ventricular diastolic parameters were normal.  2. Right ventricular systolic function is normal. The right ventricular size is normal. There is moderately elevated pulmonary artery systolic pressure.  3. Left atrial  size was mildly dilated.  4. The mitral valve is abnormal. Trivial mitral valve regurgitation. No evidence of mitral stenosis. Moderate mitral annular calcification.  5. The aortic valve is tricuspid. There is moderate calcification of the aortic valve. There is moderate thickening of the aortic valve. Aortic valve regurgitation is not visualized. Aortic valve sclerosis is present, with no evidence of aortic valve stenosis.  6. The inferior vena  cava is normal in size with greater than 50% respiratory variability, suggesting right atrial pressure of 3 mmHg. FINDINGS  Left Ventricle: Left ventricular ejection fraction, by estimation, is 60 to 65%. The left ventricle has normal function. The left ventricle has no regional wall motion abnormalities. The left ventricular internal cavity size was normal in size. There is  moderate left ventricular hypertrophy. Left ventricular diastolic parameters were normal. Right Ventricle: The right ventricular size is normal. No increase in right ventricular wall thickness. Right ventricular systolic function is normal. There is moderately elevated pulmonary artery systolic pressure. The tricuspid regurgitant velocity is 3.16 m/s, and with an assumed right atrial pressure of 8 mmHg, the estimated right ventricular systolic pressure is [id number] mmHg. Left Atrium: Left atrial size was mildly dilated. Right Atrium: Right atrial size was normal in size. Pericardium: There is no evidence of pericardial effusion. Mitral Valve: The mitral valve is abnormal. There is mild thickening of the mitral valve leaflet(s). There is mild calcification of the mitral valve leaflet(s). Moderate mitral annular calcification. Trivial mitral valve regurgitation. No evidence of mitral valve stenosis. MV peak gradient, 8.2 mmHg. The mean mitral valve gradient is 5.0 mmHg. Tricuspid Valve: The tricuspid valve is normal in structure. Tricuspid valve regurgitation is not demonstrated. No evidence of tricuspid stenosis. Aortic Valve: The aortic valve is tricuspid. There is moderate calcification of the aortic valve. There is moderate thickening of the aortic valve. Aortic valve regurgitation is not visualized. Aortic valve sclerosis is present, with no evidence of aortic valve stenosis. Pulmonic Valve: The pulmonic valve was normal in structure. Pulmonic valve regurgitation is not visualized. No evidence of pulmonic stenosis. Aorta: The aortic root is  normal in size and structure. Venous: The inferior vena cava is normal in size with greater than 50% respiratory variability, suggesting right atrial pressure of 3 mmHg. IAS/Shunts: No atrial level shunt detected by color flow Doppler.  LEFT VENTRICLE PLAX 2D LVIDd:         4.70 cm      Diastology LVIDs:         2.60 cm      LV e' medial:    8.92 cm/s LV PW:         1.30 cm      LV E/e' medial:  16.7 LV IVS:        1.40 cm      LV e' lateral:   11.00 cm/s LVOT diam:     2.00 cm      LV E/e' lateral: 13.5 LV SV:         99 LV SV Index:   53 LVOT Area:     3.14 cm  LV Volumes (MOD) LV vol d, MOD A2C: 98.0 ml LV vol d, MOD A4C: 104.0 ml LV vol s, MOD A2C: 30.6 ml LV vol s, MOD A4C: 35.3 ml LV SV MOD A2C:     67.4 ml LV SV MOD A4C:     104.0 ml LV SV MOD BP:      68.4 ml RIGHT VENTRICLE  IVC RV Basal diam:  4.40 cm     IVC diam: 2.50 cm RV S prime:     17.60 cm/s TAPSE (M-mode): 2.9 cm LEFT ATRIUM             Index        RIGHT ATRIUM           Index LA diam:        3.90 cm 2.08 cm/m   RA Area:     18.70 cm LA Vol (A2C):   85.0 ml 45.24 ml/m  RA Volume:   48.80 ml  25.97 ml/m LA Vol (A4C):   82.6 ml 43.96 ml/m LA Biplane Vol: 88.0 ml 46.84 ml/m  AORTIC VALVE LVOT Vmax:   139.00 cm/s LVOT Vmean:  81.900 cm/s LVOT VTI:    0.314 m  AORTA Ao Root diam: 3.20 cm Ao Asc diam:  2.70 cm MITRAL VALVE                TRICUSPID VALVE MV Area (PHT): 3.87 cm     TR Peak grad:   39.9 mmHg MV Area VTI:   2.39 cm     TR Vmax:        316.00 cm/s MV Peak grad:  8.2 mmHg MV Mean grad:  5.0 mmHg     SHUNTS MV Vmax:       1.43 m/s     Systemic VTI:  0.31 m MV Vmean:      101.0 cm/s   Systemic Diam: 2.00 cm MV Decel Time: 196 msec MV E velocity: 149.00 cm/s MV A velocity: 145.00 cm/s MV E/A ratio:  1.03 Jenkins Rouge MD Electronically signed by Jenkins Rouge MD Signature Date/Time: 02/21/2023/10:37:03 AM    Final    CT Angio Chest PE W/Cm &/Or Wo Cm  Result Date: 02/20/2023 CLINICAL DATA:  Lung cancer, dyspnea, productive  cough EXAM: CT ANGIOGRAPHY CHEST WITH CONTRAST TECHNIQUE: Multidetector CT imaging of the chest was performed using the standard protocol during bolus administration of intravenous contrast. Multiplanar CT image reconstructions and MIPs were obtained to evaluate the vascular anatomy. RADIATION DOSE REDUCTION: This exam was performed according to the departmental dose-optimization program which includes automated exposure control, adjustment of the mA and/or kV according to patient size and/or use of iterative reconstruction technique. CONTRAST:  59mL OMNIPAQUE IOHEXOL 350 MG/ML SOLN COMPARISON:  10/10/2022 FINDINGS: Cardiovascular: Mild coronary artery calcification. Global cardiac size within normal limits. No pericardial effusion. Central pulmonary arteries are normal caliber. There is adequate opacification of the pulmonary arterial tree. A tiny intraluminal filling defect is identified within the right lower lobar posterior segmental pulmonary arterial branch in keeping with acute pulmonary embolus. The embolic burden is tiny. There is no CT evidence of right heart strain. Moderate atherosclerotic calcification within the thoracic aorta. No aortic aneurysm. Mediastinum/Nodes: Visualized thyroid is unremarkable. No pathologic thoracic adenopathy. Esophagus is unremarkable. Small hiatal hernia. Lungs/Pleura: Right apical wedge resection again noted with mild residual scarring. Bibasilar scarring within the right middle lobe and lingula again noted. Ground-glass pulmonary nodules within the right lung and noncalcified 6 mm solid pulmonary nodule within the a left lower lobe are stable. Previously noted inflammatory appearing pulmonary nodule within the posterior basal left lower lobe has resolved. No new focal pulmonary nodules or infiltrates. No pneumothorax or pleural effusion. Central airways are widely patent. Upper Abdomen: No acute abnormality. Musculoskeletal: Osseous structures are age-appropriate. No acute  bone abnormality. Review of the MIP images confirms the above findings. IMPRESSION:  1. Acute pulmonary embolus with tiny embolic burden. No CT evidence of right heart strain. 2. Mild coronary artery calcification. 3. Multiple stable ground-glass and solid pulmonary nodules. Continued attention on surveillance CT imaging is warranted. These results were called by telephone at the time of interpretation on 02/20/2023 at 8:18 pm to provider [facility] , who verbally acknowledged these results. Aortic Atherosclerosis (ICD10-I70.0). Electronically Signed   By: Fidela Salisbury M.D.   On: 02/20/2023 20:18   DG Chest Port 1 View  Result Date: 02/20/2023 CLINICAL DATA:  Shortness of breath. EXAM: PORTABLE CHEST 1 VIEW COMPARISON:  Chest radiograph dated 08/20/2022 and CT dated 10/10/2022. FINDINGS: Postsurgical changes of the right upper lobe and surgical suture. Bibasilar densities, right greater left may represent combination of atelectasis/scarring or correspond to the scattered nodular densities in the right middle lobe seen on the prior CT. No pleural effusion pneumothorax. The cardiac silhouette is within normal limits. Atherosclerotic calcification of the aortic arch. No acute osseous pathology. Degenerative changes of the spine. IMPRESSION: 1. Bibasilar atelectasis/scarring. Infiltrate in the right lung base is not excluded. Clinical correlation is recommended. 2. Postsurgical changes of the right upper lobe. Electronically Signed   By: Anner Crete M.D.   On: 02/20/2023 18:58        Scheduled Meds:  amLODipine  2.5 mg Oral Daily   enoxaparin (LOVENOX) injection  90 mg Subcutaneous Q12H   famotidine  20 mg Oral BID   lisinopril  20 mg Oral Daily   And   hydrochlorothiazide  25 mg Oral Daily   ipratropium-albuterol  3 mL Nebulization BID   melatonin  10 mg Oral QHS   potassium chloride SA  20 mEq Oral Daily   pravastatin  10 mg Oral q1800   Continuous Infusions:   LOS: 2 days    Time  spent: 38 minutes  Georgette Shell, MD  02/22/2023, 11:15 AM

## 2023-02-23 ENCOUNTER — Other Ambulatory Visit (HOSPITAL_COMMUNITY): Payer: Self-pay

## 2023-02-23 DIAGNOSIS — I2699 Other pulmonary embolism without acute cor pulmonale: Secondary | ICD-10-CM | POA: Diagnosis not present

## 2023-02-23 LAB — CREATININE, SERUM
Creatinine, Ser: 1.1 mg/dL — ABNORMAL HIGH (ref 0.44–1.00)
GFR, Estimated: 51 mL/min — ABNORMAL LOW (ref >60)

## 2023-02-23 LAB — OCCULT BLOOD X 1 CARD TO LAB, STOOL: Fecal Occult Bld: NEGATIVE

## 2023-02-23 LAB — GLUCOSE, CAPILLARY: Glucose-Capillary: 170 mg/dL — ABNORMAL HIGH (ref 70–99)

## 2023-02-23 MED ORDER — ENOXAPARIN SODIUM 150 MG/ML IJ SOSY
1.5000 mg/kg | PREFILLED_SYRINGE | INTRAMUSCULAR | 2 refills | Status: DC
  Filled 2023-02-23: qty 26.4, 30d supply, fill #0

## 2023-02-23 MED ORDER — ENOXAPARIN SODIUM 150 MG/ML IJ SOSY
130.0000 mg | PREFILLED_SYRINGE | Freq: Every day | INTRAMUSCULAR | 2 refills | Status: DC
  Filled 2023-02-23 – 2023-03-19 (×2): qty 30, 30d supply, fill #0
  Filled 2023-04-17 – 2023-04-20 (×3): qty 30, 30d supply, fill #1

## 2023-02-23 NOTE — Progress Notes (Signed)
Mobility Specialist - Progress Note   02/23/23 1119  Mobility  Activity Ambulated with assistance in hallway  Level of Assistance Standby assist, set-up cues, supervision of patient - no hands on  Assistive Device None  Distance Ambulated (ft) 420 ft  Activity Response Tolerated well  Mobility Referral Yes  $Mobility charge 1 Mobility   Pt received in bed and agreed to mobility. Had no pain, pt began feeling somewhat winded and SOB. Pt SpO2 was at 97%, educated that her SOB may be due to talking and walking simultaneously. Pt no longer felt winded after continuing session with no talking and just breathing.  Pt returned to bed with all needs met.   Roderick Pee Mobility Specialist

## 2023-02-23 NOTE — TOC Benefit Eligibility Note (Signed)
Patient Teacher, English as a foreign language completed.    The patient is currently admitted and upon discharge could be taking Lovenox 100mg .  The current 30 day co-pay is $95.00.    Patient Teacher, English as a foreign language completed.    The patient is currently admitted and upon discharge could be taking Lovenox 150mg .  The current 30 day co-pay is $95.00.   The patient is insured through [employer] Part [employer]   This test claim was processed through [facility] amounts may vary at other pharmacies due to pharmacy/plan contracts, or as the patient moves through the different stages of their insurance plan.

## 2023-02-23 NOTE — Discharge Summary (Signed)
Physician Discharge Summary  Julie Gill [id number] DOB: [birth year] DOA: 02/20/2023  PCP: [name], MD  Admit date: 02/20/2023 Discharge date: 02/23/2023  Admitted From: Home Disposition: Home Recommendations for Outpatient Follow-up:  Follow up with PCP in 1-2 weeks Please obtain BMP/CBC in one week Please follow up with oncology Dr. Julien Nordmann  Home Health: None Equipment/Devices: None Discharge Condition: Stable CODE STATUS full code Diet recommendation: Cardiac Brief/Interim Summary: 82 yo female with HTN, HLD, stage 1B adenocarcinoma of lung status post wedge resection of the RUL in 2017 and atrial fibrillation maintained on Xarelto.  Patient reports she has not missed any Xarelto.  She was admitted with shortness of breath and wheezing.  She is found to have acute pulmonary embolus with tiny embolic burden.  No CT evidence of right heart strain.  Echocardiogram and Dopplers of the lower extremities are pending. Patient has been started on Lovenox 1 mg/kg twice a day after discussing with oncology.  Not hypoxic on admit Lives with her elderly husband  Discharge Diagnoses:  Principal Problem:   Acute pulmonary embolism ([place]) Active Problems:   Paroxysmal atrial fibrillation ([facility])   Essential hypertension   Adenocarcinoma of lung ([facility])   Secondary hypercoagulable state ([place])    #1 acute embolism failed outpatient treatment with Xarelto in the setting of hypercoagulable state history of adenocarcinoma of the lungs patient is admitted with shortness of breath and wheezing and was found to have acute PE by CT chest.   Discussed with oncology continue Lovenox 1 mg/kg twice daily for a month and then 1.5 mg/kg daily Patient preferred once a day dosing as it would be easier on her and her husband. lower extremity and upper extremity Dopplers negative for DVT.      #2 paroxysmal atrial fibrillation continue Lovenox  rate is controlled  Not on rate controlling  medications.   #3 history of stage I adenocarcinoma status post wedge resection of the right upper lobe.  Follow-up with Dr. Julien Nordmann as an outpatient.     #4 history of essential hypertension on Norvasc and Zestoretic as an outpatient Continue same.   #5 hyperlipidemia on statin   #6 anemia of chronic disease hemoglobin 9.  Stable.  Estimated body mass index is 35.12 kg/m as calculated from the following:   Height as of this encounter: 5\' 2"  (1.575 m).   Weight as of this encounter: 87.1 kg.  Discharge Instructions  Discharge Instructions     Diet - low sodium heart healthy   Complete by: As directed    Increase activity slowly   Complete by: As directed       Allergies as of 02/23/2023       Reactions   Codeine Other (See Comments)   Skin crawls, jittery, agitated, rash   Prednisone Other (See Comments)   Tachycardia, light headed, rash        Medication List     STOP taking these medications    amLODipine 2.5 MG tablet Commonly known as: NORVASC   melatonin 5 MG Tabs   Xarelto 20 MG Tabs tablet Generic drug: rivaroxaban       TAKE these medications    acetaminophen 500 MG tablet Commonly known as: TYLENOL Take 1,000 mg by mouth daily as needed (pain).   calcium carbonate 600 MG Tabs tablet Commonly known as: OS-CAL Take 600 mg by mouth daily.   clonazePAM 0.5 MG tablet Commonly known as: KLONOPIN Take 0.25 mg by mouth daily as needed for anxiety.  enoxaparin 150 MG/ML injection Commonly known as: LOVENOX Inject 130 mg subcu daily   famotidine 20 MG tablet Commonly known as: PEPCID Take 20 mg by mouth at bedtime.   lisinopril-hydrochlorothiazide 20-25 MG tablet Commonly known as: ZESTORETIC Take 1 tablet by mouth daily.   lovastatin 40 MG tablet Commonly known as: MEVACOR Take 80 mg by mouth at bedtime.   MAGNESIUM PO Take 250 mg by mouth daily.   nitroGLYCERIN 0.4 MG SL tablet Commonly known as: NITROSTAT Place 1 tablet (0.4  mg total) under the tongue every 5 (five) minutes as needed for chest pain.   polyethylene glycol 17 g packet Commonly known as: MIRALAX / GLYCOLAX Take 17 g by mouth daily as needed for moderate constipation.   potassium chloride SA 20 MEQ tablet Commonly known as: KLOR-CON M Take 20 mEq by mouth daily.   senna 8.6 MG tablet Commonly known as: SENOKOT Take 1 tablet by mouth daily.   VITAMIN D PO Take 2,000 Units by mouth daily.   vitamin E 180 MG (400 UNITS) capsule Take 400 Units by mouth daily.        Follow-up Information     [name], MD Follow up in 1 week(s).   Specialty: Family Medicine Contact information: [place] [address] [phone]                Allergies  Allergen Reactions   Codeine Other (See Comments)    Skin crawls, jittery, agitated, rash   Prednisone Other (See Comments)    Tachycardia, light headed, rash    Consultations: None discussed with Dr. Julien Nordmann over the phone   Procedures/Studies: VAS [place] UPPER EXTREMITY VENOUS DUPLEX  Result Date: 02/23/2023 UPPER VENOUS STUDY  Patient Name:  Julie Gill  Date of Exam:   02/21/2023 Medical Rec #: [id number]       Accession #:    [id number] Date of Birth: 1941-07-30       Patient Gender: F Patient Age:   59 years Exam Location:  [facility] Procedure:      VAS [place] UPPER EXTREMITY VENOUS DUPLEX Referring Phys: Benjamine Mola Janelie Goltz --------------------------------------------------------------------------------  Indications: Cramping in bilateral arms x1 day, pulmonary embolism Comparison Study: No prior studies. Performing Technologist: Darlin Coco RDMS, RVT  Examination Guidelines: A complete evaluation includes B-mode imaging, spectral Doppler, color Doppler, and power Doppler as needed of all accessible portions of each vessel. Bilateral testing is considered an integral part of a complete examination. Limited examinations for reoccurring indications may be  performed as noted.  Right Findings: +----------+------------+---------+-----------+----------+-------+ RIGHT     CompressiblePhasicitySpontaneousPropertiesSummary +----------+------------+---------+-----------+----------+-------+ IJV           Full       Yes       Yes                      +----------+------------+---------+-----------+----------+-------+ Subclavian               Yes       Yes                      +----------+------------+---------+-----------+----------+-------+ Axillary      Full       Yes       Yes                      +----------+------------+---------+-----------+----------+-------+ Brachial      Full                                          +----------+------------+---------+-----------+----------+-------+  Radial        Full                                          +----------+------------+---------+-----------+----------+-------+ Ulnar         Full                                          +----------+------------+---------+-----------+----------+-------+ Cephalic      Full                                          +----------+------------+---------+-----------+----------+-------+ Basilic       Full                                          +----------+------------+---------+-----------+----------+-------+  Left Findings: +----------+------------+---------+-----------+----------+-------+ LEFT      CompressiblePhasicitySpontaneousPropertiesSummary +----------+------------+---------+-----------+----------+-------+ IJV           Full       Yes       Yes                      +----------+------------+---------+-----------+----------+-------+ Subclavian               Yes       Yes                      +----------+------------+---------+-----------+----------+-------+ Axillary      Full       Yes       Yes                      +----------+------------+---------+-----------+----------+-------+ Brachial      Full                                           +----------+------------+---------+-----------+----------+-------+ Radial        Full                                          +----------+------------+---------+-----------+----------+-------+ Ulnar         Full                                          +----------+------------+---------+-----------+----------+-------+ Cephalic      Full                                          +----------+------------+---------+-----------+----------+-------+ Basilic       Full                                          +----------+------------+---------+-----------+----------+-------+  Summary:  Right: No evidence of deep vein thrombosis in the upper extremity. No evidence of superficial vein thrombosis in the upper extremity.  Left: No evidence of deep vein thrombosis in the upper extremity. No evidence of superficial vein thrombosis in the upper extremity.  *See table(s) above for measurements and observations.  Diagnosing physician: Orlie Pollen Electronically signed by Orlie Pollen on 02/23/2023 at 3:42:56 AM.    Final    VAS [place] LOWER EXTREMITY VENOUS (DVT)  Result Date: 02/23/2023  Lower Venous DVT Study Patient Name:  Julie Gill  Date of Exam:   02/21/2023 Medical Rec #: [id number]       Accession #:    [id number] Date of Birth: 10/06/1941       Patient Gender: F Patient Age:   42 years Exam Location:  [facility] Procedure:      VAS [place] LOWER EXTREMITY VENOUS (DVT) Referring Phys: DEBBY CROSLEY --------------------------------------------------------------------------------  Indications: Pulmonary embolism. Other Indications: Varicosities, LT>RT, patient endorses pain/cramping that is                    worse when lying down. Comparison Study: No prior studies. Performing Technologist: Darlin Coco RDMS, RVT  Examination Guidelines: A complete evaluation includes B-mode imaging, spectral Doppler, color Doppler, and power Doppler as needed  of all accessible portions of each vessel. Bilateral testing is considered an integral part of a complete examination. Limited examinations for reoccurring indications may be performed as noted. The reflux portion of the exam is performed with the patient in reverse Trendelenburg.  +---------+---------------+---------+-----------+----------+--------------+ RIGHT    CompressibilityPhasicitySpontaneityPropertiesThrombus Aging +---------+---------------+---------+-----------+----------+--------------+ CFV      Full           Yes      Yes                                 +---------+---------------+---------+-----------+----------+--------------+ SFJ      Full                                                        +---------+---------------+---------+-----------+----------+--------------+ FV Prox  Full                                                        +---------+---------------+---------+-----------+----------+--------------+ FV Mid   Full                                                        +---------+---------------+---------+-----------+----------+--------------+ FV DistalFull                                                        +---------+---------------+---------+-----------+----------+--------------+ PFV      Full                                                        +---------+---------------+---------+-----------+----------+--------------+  POP      Full           Yes      Yes                                 +---------+---------------+---------+-----------+----------+--------------+ PTV      Full                                                        +---------+---------------+---------+-----------+----------+--------------+ PERO     Full                                                        +---------+---------------+---------+-----------+----------+--------------+ Gastroc  Full                                                         +---------+---------------+---------+-----------+----------+--------------+   +---------+---------------+---------+-----------+----------+--------------+ LEFT     CompressibilityPhasicitySpontaneityPropertiesThrombus Aging +---------+---------------+---------+-----------+----------+--------------+ CFV      Full           Yes      Yes                                 +---------+---------------+---------+-----------+----------+--------------+ SFJ      Full                                                        +---------+---------------+---------+-----------+----------+--------------+ FV Prox  Full                                                        +---------+---------------+---------+-----------+----------+--------------+ FV Mid   Full                                                        +---------+---------------+---------+-----------+----------+--------------+ FV DistalFull                                                        +---------+---------------+---------+-----------+----------+--------------+ PFV      Full                                                        +---------+---------------+---------+-----------+----------+--------------+  POP      Full           Yes      Yes                                 +---------+---------------+---------+-----------+----------+--------------+ PTV      Full                                                        +---------+---------------+---------+-----------+----------+--------------+ PERO     Full                                                        +---------+---------------+---------+-----------+----------+--------------+ Gastroc  Full                                                        +---------+---------------+---------+-----------+----------+--------------+     Summary: RIGHT: - There is no evidence of deep vein thrombosis in the lower extremity.  - No cystic structure  found in the popliteal fossa.  LEFT: - There is no evidence of deep vein thrombosis in the lower extremity.  - No cystic structure found in the popliteal fossa.  *See table(s) above for measurements and observations. Electronically signed by Orlie Pollen on 02/23/2023 at 3:42:48 AM.    Final    ECHOCARDIOGRAM COMPLETE  Result Date: 02/21/2023    ECHOCARDIOGRAM REPORT   Patient Name:   Julie Gill [place]iles Date of Exam: 02/21/2023 Medical Rec #:  [id number]      Height:       62.0 in Accession #:    [id number]     Weight:       192.0 lb Date of Birth:  [birth year]      BSA:          1.879 m Patient Age:    60 years       BP:           132/62 mmHg Patient Gender: F              HR:           87 bpm. Exam Location:  Inpatient Procedure: 2D Echo, Cardiac Doppler and Color Doppler Indications:    I26.02 Pulmonary embolus  History:        Patient has prior history of Echocardiogram examinations.                 Arrythmias:Atrial Fibrillation; Risk Factors:Hypertension and                 Dyslipidemia.  Sonographer:    Phineas Douglas Referring Phys: [phone] DEBBY CROSLEY IMPRESSIONS  1. Left ventricular ejection fraction, by estimation, is 60 to 65%. The left ventricle has normal function. The left ventricle has no regional wall motion abnormalities. There is moderate left ventricular hypertrophy. Left ventricular diastolic parameters were normal.  2. Right ventricular systolic function is normal. The right ventricular size is normal. There  is moderately elevated pulmonary artery systolic pressure.  3. Left atrial size was mildly dilated.  4. The mitral valve is abnormal. Trivial mitral valve regurgitation. No evidence of mitral stenosis. Moderate mitral annular calcification.  5. The aortic valve is tricuspid. There is moderate calcification of the aortic valve. There is moderate thickening of the aortic valve. Aortic valve regurgitation is not visualized. Aortic valve sclerosis is present, with no evidence of aortic valve  stenosis.  6. The inferior vena cava is normal in size with greater than 50% respiratory variability, suggesting right atrial pressure of 3 mmHg. FINDINGS  Left Ventricle: Left ventricular ejection fraction, by estimation, is 60 to 65%. The left ventricle has normal function. The left ventricle has no regional wall motion abnormalities. The left ventricular internal cavity size was normal in size. There is  moderate left ventricular hypertrophy. Left ventricular diastolic parameters were normal. Right Ventricle: The right ventricular size is normal. No increase in right ventricular wall thickness. Right ventricular systolic function is normal. There is moderately elevated pulmonary artery systolic pressure. The tricuspid regurgitant velocity is 3.16 m/s, and with an assumed right atrial pressure of 8 mmHg, the estimated right ventricular systolic pressure is [id number] mmHg. Left Atrium: Left atrial size was mildly dilated. Right Atrium: Right atrial size was normal in size. Pericardium: There is no evidence of pericardial effusion. Mitral Valve: The mitral valve is abnormal. There is mild thickening of the mitral valve leaflet(s). There is mild calcification of the mitral valve leaflet(s). Moderate mitral annular calcification. Trivial mitral valve regurgitation. No evidence of mitral valve stenosis. MV peak gradient, 8.2 mmHg. The mean mitral valve gradient is 5.0 mmHg. Tricuspid Valve: The tricuspid valve is normal in structure. Tricuspid valve regurgitation is not demonstrated. No evidence of tricuspid stenosis. Aortic Valve: The aortic valve is tricuspid. There is moderate calcification of the aortic valve. There is moderate thickening of the aortic valve. Aortic valve regurgitation is not visualized. Aortic valve sclerosis is present, with no evidence of aortic valve stenosis. Pulmonic Valve: The pulmonic valve was normal in structure. Pulmonic valve regurgitation is not visualized. No evidence of pulmonic stenosis.  Aorta: The aortic root is normal in size and structure. Venous: The inferior vena cava is normal in size with greater than 50% respiratory variability, suggesting right atrial pressure of 3 mmHg. IAS/Shunts: No atrial level shunt detected by color flow Doppler.  LEFT VENTRICLE PLAX 2D LVIDd:         4.70 cm      Diastology LVIDs:         2.60 cm      LV e' medial:    8.92 cm/s LV PW:         1.30 cm      LV E/e' medial:  16.7 LV IVS:        1.40 cm      LV e' lateral:   11.00 cm/s LVOT diam:     2.00 cm      LV E/e' lateral: 13.5 LV SV:         99 LV SV Index:   53 LVOT Area:     3.14 cm  LV Volumes (MOD) LV vol d, MOD A2C: 98.0 ml LV vol d, MOD A4C: 104.0 ml LV vol s, MOD A2C: 30.6 ml LV vol s, MOD A4C: 35.3 ml LV SV MOD A2C:     67.4 ml LV SV MOD A4C:     104.0 ml LV SV MOD BP:  68.4 ml RIGHT VENTRICLE             IVC RV Basal diam:  4.40 cm     IVC diam: 2.50 cm RV S prime:     17.60 cm/s TAPSE (M-mode): 2.9 cm LEFT ATRIUM             Index        RIGHT ATRIUM           Index LA diam:        3.90 cm 2.08 cm/m   RA Area:     18.70 cm LA Vol (A2C):   85.0 ml 45.24 ml/m  RA Volume:   48.80 ml  25.97 ml/m LA Vol (A4C):   82.6 ml 43.96 ml/m LA Biplane Vol: 88.0 ml 46.84 ml/m  AORTIC VALVE LVOT Vmax:   139.00 cm/s LVOT Vmean:  81.900 cm/s LVOT VTI:    0.314 m  AORTA Ao Root diam: 3.20 cm Ao Asc diam:  2.70 cm MITRAL VALVE                TRICUSPID VALVE MV Area (PHT): 3.87 cm     TR Peak grad:   39.9 mmHg MV Area VTI:   2.39 cm     TR Vmax:        316.00 cm/s MV Peak grad:  8.2 mmHg MV Mean grad:  5.0 mmHg     SHUNTS MV Vmax:       1.43 m/s     Systemic VTI:  0.31 m MV Vmean:      101.0 cm/s   Systemic Diam: 2.00 cm MV Decel Time: 196 msec MV E velocity: 149.00 cm/s MV A velocity: 145.00 cm/s MV E/A ratio:  1.03 Jenkins Rouge MD Electronically signed by Jenkins Rouge MD Signature Date/Time: 02/21/2023/10:37:03 AM    Final    CT Angio Chest PE W/Cm &/Or Wo Cm  Result Date: 02/20/2023 CLINICAL DATA:  Lung  cancer, dyspnea, productive cough EXAM: CT ANGIOGRAPHY CHEST WITH CONTRAST TECHNIQUE: Multidetector CT imaging of the chest was performed using the standard protocol during bolus administration of intravenous contrast. Multiplanar CT image reconstructions and MIPs were obtained to evaluate the vascular anatomy. RADIATION DOSE REDUCTION: This exam was performed according to the departmental dose-optimization program which includes automated exposure control, adjustment of the mA and/or kV according to patient size and/or use of iterative reconstruction technique. CONTRAST:  81mL OMNIPAQUE IOHEXOL 350 MG/ML SOLN COMPARISON:  10/10/2022 FINDINGS: Cardiovascular: Mild coronary artery calcification. Global cardiac size within normal limits. No pericardial effusion. Central pulmonary arteries are normal caliber. There is adequate opacification of the pulmonary arterial tree. A tiny intraluminal filling defect is identified within the right lower lobar posterior segmental pulmonary arterial branch in keeping with acute pulmonary embolus. The embolic burden is tiny. There is no CT evidence of right heart strain. Moderate atherosclerotic calcification within the thoracic aorta. No aortic aneurysm. Mediastinum/Nodes: Visualized thyroid is unremarkable. No pathologic thoracic adenopathy. Esophagus is unremarkable. Small hiatal hernia. Lungs/Pleura: Right apical wedge resection again noted with mild residual scarring. Bibasilar scarring within the right middle lobe and lingula again noted. Ground-glass pulmonary nodules within the right lung and noncalcified 6 mm solid pulmonary nodule within the a left lower lobe are stable. Previously noted inflammatory appearing pulmonary nodule within the posterior basal left lower lobe has resolved. No new focal pulmonary nodules or infiltrates. No pneumothorax or pleural effusion. Central airways are widely patent. Upper Abdomen: No acute abnormality. Musculoskeletal: Osseous structures  are age-appropriate. No acute bone abnormality. Review of the MIP images confirms the above findings. IMPRESSION: 1. Acute pulmonary embolus with tiny embolic burden. No CT evidence of right heart strain. 2. Mild coronary artery calcification. 3. Multiple stable ground-glass and solid pulmonary nodules. Continued attention on surveillance CT imaging is warranted. These results were called by telephone at the time of interpretation on 02/20/2023 at 8:18 pm to provider [employer] , who verbally acknowledged these results. Aortic Atherosclerosis (ICD10-I70.0). Electronically Signed   By: Fidela Salisbury M.D.   On: 02/20/2023 20:18   DG Chest Port 1 View  Result Date: 02/20/2023 CLINICAL DATA:  Shortness of breath. EXAM: PORTABLE CHEST 1 VIEW COMPARISON:  Chest radiograph dated 08/20/2022 and CT dated 10/10/2022. FINDINGS: Postsurgical changes of the right upper lobe and surgical suture. Bibasilar densities, right greater left may represent combination of atelectasis/scarring or correspond to the scattered nodular densities in the right middle lobe seen on the prior CT. No pleural effusion pneumothorax. The cardiac silhouette is within normal limits. Atherosclerotic calcification of the aortic arch. No acute osseous pathology. Degenerative changes of the spine. IMPRESSION: 1. Bibasilar atelectasis/scarring. Infiltrate in the right lung base is not excluded. Clinical correlation is recommended. 2. Postsurgical changes of the right upper lobe. Electronically Signed   By: Anner Crete M.D.   On: 02/20/2023 18:58   (Echo, Carotid, EGD, Colonoscopy, ERCP)    Subjective: She is sitting with the side of the bed in no acute distress she is anxious to go home but at the same nervous about giving Lovenox shots twice a day she does not think her husband will be able to give it to her and she prefers to do it once a day so she can be compliant with it.  Discharge Exam: Vitals:   02/23/23 0507 02/23/23 0839  BP:  (!) 131/59   Pulse: 70   Resp: 17   Temp: 98.2 F (36.8 C)   SpO2: 98% 100%   Vitals:   02/22/23 1937 02/22/23 2118 02/23/23 0507 02/23/23 0839  BP:  (!) 141/67 (!) 131/59   Pulse:  80 70   Resp:  18 17   Temp:  98.3 F (36.8 C) 98.2 F (36.8 C)   TempSrc:  Oral Oral   SpO2: 99% 96% 98% 100%  Weight:      Height:        General: Pt is alert, awake, not in acute distress Cardiovascular: RRR, S1/S2 +, no rubs, no gallops Respiratory: CTA bilaterally, no wheezing, no rhonchi Abdominal: Soft, NT, ND, bowel sounds + Extremities: no edema, no cyanosis    The results of significant diagnostics from this hospitalization (including imaging, microbiology, ancillary and laboratory) are listed below for reference.     Microbiology: Recent Results (from the past 240 hour(s))  Resp panel by RT-PCR (RSV, Flu A&B, Covid) Nasopharyngeal Swab     Status: None   Collection Time: 02/20/23  6:07 PM   Specimen: Nasopharyngeal Swab; Nasal Swab  Result Value Ref Range Status   SARS Coronavirus 2 by RT PCR NEGATIVE NEGATIVE Final    Comment: (NOTE) SARS-CoV-2 target nucleic acids are NOT DETECTED.  The SARS-CoV-2 RNA is generally detectable in upper respiratory specimens during the acute phase of infection. The lowest concentration of SARS-CoV-2 viral copies this assay can detect is 138 copies/mL. A negative result does not preclude SARS-Cov-2 infection and should not be used as the sole basis for treatment or other patient management decisions. A negative result may occur  with  improper specimen collection/handling, submission of specimen other than nasopharyngeal swab, presence of viral mutation(s) within the areas targeted by this assay, and inadequate number of viral copies(<138 copies/mL). A negative result must be combined with clinical observations, patient history, and epidemiological information. The expected result is Negative.  Fact Sheet for Patients:   EntrepreneurPulse.com.au  Fact Sheet for Healthcare Providers:  IncredibleEmployment.be  This test is no t yet approved or cleared by the [place] FDA and  has been authorized for detection and/or diagnosis of SARS-CoV-2 by FDA under an Emergency Use Authorization (EUA). This EUA will remain  in effect (meaning this test can be used) for the duration of the COVID-19 declaration under Section 564(b)(1) of the Act, 21 U.S.C.section 360bbb-3(b)(1), unless the authorization is terminated  or revoked sooner.       Influenza A by PCR NEGATIVE NEGATIVE Final   Influenza B by PCR NEGATIVE NEGATIVE Final    Comment: (NOTE) The Xpert Xpress SARS-CoV-2/FLU/RSV plus assay is intended as an aid in the diagnosis of influenza from Nasopharyngeal swab specimens and should not be used as a sole basis for treatment. Nasal washings and aspirates are unacceptable for Xpert Xpress SARS-CoV-2/FLU/RSV testing.  Fact Sheet for Patients: EntrepreneurPulse.com.au  Fact Sheet for Healthcare Providers: IncredibleEmployment.be  This test is not yet approved or cleared by the [place] FDA and has been authorized for detection and/or diagnosis of SARS-CoV-2 by FDA under an Emergency Use Authorization (EUA). This EUA will remain in effect (meaning this test can be used) for the duration of the COVID-19 declaration under Section 564(b)(1) of the Act, 21 U.S.C. section 360bbb-3(b)(1), unless the authorization is terminated or revoked.     Resp Syncytial Virus by PCR NEGATIVE NEGATIVE Final    Comment: (NOTE) Fact Sheet for Patients: EntrepreneurPulse.com.au  Fact Sheet for Healthcare Providers: IncredibleEmployment.be  This test is not yet approved or cleared by the [place] FDA and has been authorized for detection and/or diagnosis of SARS-CoV-2 by FDA under an Emergency Use  Authorization (EUA). This EUA will remain in effect (meaning this test can be used) for the duration of the COVID-19 declaration under Section 564(b)(1) of the Act, 21 U.S.C. section 360bbb-3(b)(1), unless the authorization is terminated or revoked.  Performed at [facility], [address], [place], [place] [address]      Labs: BNP (last 3 results) No results for input(s): "BNP" in the last 8760 hours. Basic Metabolic Panel: Recent Labs  Lab 02/20/23 1825 02/21/23 0418 02/22/23 0412 02/23/23 0355  NA 138 138 139  --   K 4.0 3.9 4.2  --   CL 102 105 105  --   CO2 27 25 26   --   GLUCOSE 103* 137* 103*  --   BUN 31* 24* 19  --   CREATININE 1.04* 0.76 0.98 1.10*  CALCIUM 9.7 8.7* 9.0  --   MG  --   --  2.2  --    Liver Function Tests: Recent Labs  Lab 02/22/23 0412  AST 20  ALT 18  ALKPHOS 72  BILITOT 0.3  PROT 6.0*  ALBUMIN 3.3*   No results for input(s): "LIPASE", "AMYLASE" in the last 168 hours. No results for input(s): "AMMONIA" in the last 168 hours. CBC: Recent Labs  Lab 02/20/23 1825 02/21/23 0418 02/22/23 0412  WBC 8.4 7.1 5.9  NEUTROABS 5.9 5.4  --   HGB 10.6* 8.7* 9.0*  HCT 33.6* 28.3* 29.5*  MCV 92.3 95.0 95.2  PLT 268  211 208   Cardiac Enzymes: No results for input(s): "CKTOTAL", "CKMB", "CKMBINDEX", "TROPONINI" in the last 168 hours. BNP: Invalid input(s): "POCBNP" CBG: Recent Labs  Lab 02/22/23 0738 02/22/23 2119 02/23/23 0811  GLUCAP 105* 127* 170*   D-Dimer No results for input(s): "DDIMER" in the last 72 hours. Hgb A1c No results for input(s): "HGBA1C" in the last 72 hours. Lipid Profile No results for input(s): "CHOL", "HDL", "LDLCALC", "TRIG", "CHOLHDL", "LDLDIRECT" in the last 72 hours. Thyroid function studies No results for input(s): "TSH", "T4TOTAL", "T3FREE", "THYROIDAB" in the last 72 hours.  Invalid input(s): "FREET3" Anemia work up No results for input(s): "VITAMINB12", "FOLATE", "FERRITIN",  "TIBC", "IRON", "RETICCTPCT" in the last 72 hours. Urinalysis    Component Value Date/Time   COLORURINE STRAW (A) 03/20/2022 1800   APPEARANCEUR CLEAR 03/20/2022 1800   LABSPEC 1.013 03/20/2022 1800   PHURINE 5.0 03/20/2022 1800   GLUCOSEU NEGATIVE 03/20/2022 1800   HGBUR SMALL (A) 03/20/2022 1800   BILIRUBINUR NEGATIVE 03/20/2022 1800   KETONESUR NEGATIVE 03/20/2022 1800   PROTEINUR NEGATIVE 03/20/2022 1800   NITRITE NEGATIVE 03/20/2022 1800   LEUKOCYTESUR NEGATIVE 03/20/2022 1800   Sepsis Labs Recent Labs  Lab 02/20/23 1825 02/21/23 0418 02/22/23 0412  WBC 8.4 7.1 5.9   Microbiology Recent Results (from the past 240 hour(s))  Resp panel by RT-PCR (RSV, Flu A&B, Covid) Nasopharyngeal Swab     Status: None   Collection Time: 02/20/23  6:07 PM   Specimen: Nasopharyngeal Swab; Nasal Swab  Result Value Ref Range Status   SARS Coronavirus 2 by RT PCR NEGATIVE NEGATIVE Final    Comment: (NOTE) SARS-CoV-2 target nucleic acids are NOT DETECTED.  The SARS-CoV-2 RNA is generally detectable in upper respiratory specimens during the acute phase of infection. The lowest concentration of SARS-CoV-2 viral copies this assay can detect is 138 copies/mL. A negative result does not preclude SARS-Cov-2 infection and should not be used as the sole basis for treatment or other patient management decisions. A negative result may occur with  improper specimen collection/handling, submission of specimen other than nasopharyngeal swab, presence of viral mutation(s) within the areas targeted by this assay, and inadequate number of viral copies(<138 copies/mL). A negative result must be combined with clinical observations, patient history, and epidemiological information. The expected result is Negative.  Fact Sheet for Patients:  EntrepreneurPulse.com.au  Fact Sheet for Healthcare Providers:  IncredibleEmployment.be  This test is no t yet approved or  cleared by the [place] FDA and  has been authorized for detection and/or diagnosis of SARS-CoV-2 by FDA under an Emergency Use Authorization (EUA). This EUA will remain  in effect (meaning this test can be used) for the duration of the COVID-19 declaration under Section 564(b)(1) of the Act, 21 U.S.C.section 360bbb-3(b)(1), unless the authorization is terminated  or revoked sooner.       Influenza A by PCR NEGATIVE NEGATIVE Final   Influenza B by PCR NEGATIVE NEGATIVE Final    Comment: (NOTE) The Xpert Xpress SARS-CoV-2/FLU/RSV plus assay is intended as an aid in the diagnosis of influenza from Nasopharyngeal swab specimens and should not be used as a sole basis for treatment. Nasal washings and aspirates are unacceptable for Xpert Xpress SARS-CoV-2/FLU/RSV testing.  Fact Sheet for Patients: EntrepreneurPulse.com.au  Fact Sheet for Healthcare Providers: IncredibleEmployment.be  This test is not yet approved or cleared by the [place] FDA and has been authorized for detection and/or diagnosis of SARS-CoV-2 by FDA under an Emergency Use Authorization (EUA). This EUA will remain  in effect (meaning this test can be used) for the duration of the COVID-19 declaration under Section 564(b)(1) of the Act, 21 U.S.C. section 360bbb-3(b)(1), unless the authorization is terminated or revoked.     Resp Syncytial Virus by PCR NEGATIVE NEGATIVE Final    Comment: (NOTE) Fact Sheet for Patients: EntrepreneurPulse.com.au  Fact Sheet for Healthcare Providers: IncredibleEmployment.be  This test is not yet approved or cleared by the [place] FDA and has been authorized for detection and/or diagnosis of SARS-CoV-2 by FDA under an Emergency Use Authorization (EUA). This EUA will remain in effect (meaning this test can be used) for the duration of the COVID-19 declaration under Section 564(b)(1) of the Act, 21  U.S.C. section 360bbb-3(b)(1), unless the authorization is terminated or revoked.  Performed at [facility], [address] [place], [place] [address]      Time coordinating discharge: 34 minutes  SIGNED: Georgette Shell, MD  [facility] 02/23/2023, 2:39 PM

## 2023-02-23 NOTE — TOC Initial Note (Signed)
Transition of Care [facility]) - Initial/Assessment Note    Patient Details  Name: Julie Gill MRN: [id number] Date of Birth: Aug 31, 1941  [facility] CM/SW Contact:    Illene Regulus, LCSW Phone Number: 02/23/2023, 9:23 AM  Clinical Narrative:                  CSW received consult for medication assistance, pt does not qualify for [facility] program as she is insured with [facility]. No additional needs TOC sign off.         Patient Goals and CMS Choice            Expected Discharge Plan and Services                                              Prior Living Arrangements/Services                       Activities of Daily Living Home Assistive Devices/Equipment: None ADL Screening (condition at time of admission) Patient's cognitive ability adequate to safely complete daily activities?: Yes Is the patient deaf or have difficulty hearing?: No Does the patient have difficulty seeing, even when wearing glasses/contacts?: No Does the patient have difficulty concentrating, remembering, or making decisions?: No Patient able to express need for assistance with ADLs?: Yes Does the patient have difficulty dressing or bathing?: No Independently performs ADLs?: Yes (appropriate for developmental age) Does the patient have difficulty walking or climbing stairs?: No Weakness of Legs: None Weakness of Arms/Hands: None  Permission Sought/Granted                  Emotional Assessment              Admission diagnosis:  Acute pulmonary embolism ([place]) [I26.99] Other acute pulmonary embolism, unspecified whether acute cor pulmonale present ([place]) [I26.99] Patient Active Problem List   Diagnosis Date Noted   Acute pulmonary embolism ([place]) 02/20/2023   Secondary hypercoagulable state ([place]) 08/28/2021   Adenocarcinoma of right lung, stage 1 ([place]) 10/27/2016   Adenocarcinoma of lung ([place]) 10/02/2016   Edema 04/02/2016   Headache(784.0)  01/13/2012   Essential hypertension 01/06/2012   Long term current use of anticoagulant 11/19/2011   Dyspnea on exertion 11/13/2011   Sinus bradycardia    Paroxysmal atrial fibrillation ([place])    History of colonic polyps 05/06/2011   PCP:  [name], MD Pharmacy:   [facility] - [place], [place] - [address] [place] [address] [place] [address] Phone: [phone] Fax: [phone]  [facility] [place], [place] [address] Phone: [phone] Fax: [phone]     Social Determinants of Health (SDOH) Social History: SDOH Screenings   Food Insecurity: No Food Insecurity (02/20/2023)  Housing: Low Risk  (02/20/2023)  Transportation Needs: No Transportation Needs (02/20/2023)  Utilities: Not At Risk (02/20/2023)  Tobacco Use: Medium Risk (02/20/2023)   SDOH Interventions:     Readmission Risk Interventions     No data to display

## 2023-02-26 DIAGNOSIS — D649 Anemia, unspecified: Secondary | ICD-10-CM | POA: Diagnosis not present

## 2023-02-26 DIAGNOSIS — J302 Other seasonal allergic rhinitis: Secondary | ICD-10-CM | POA: Diagnosis not present

## 2023-02-26 DIAGNOSIS — R059 Cough, unspecified: Secondary | ICD-10-CM | POA: Diagnosis not present

## 2023-02-26 DIAGNOSIS — C3491 Malignant neoplasm of unspecified part of right bronchus or lung: Secondary | ICD-10-CM | POA: Diagnosis not present

## 2023-02-26 DIAGNOSIS — I4891 Unspecified atrial fibrillation: Secondary | ICD-10-CM | POA: Diagnosis not present

## 2023-02-26 DIAGNOSIS — D6869 Other thrombophilia: Secondary | ICD-10-CM | POA: Diagnosis not present

## 2023-02-26 DIAGNOSIS — I2693 Single subsegmental pulmonary embolism without acute cor pulmonale: Secondary | ICD-10-CM | POA: Diagnosis not present

## 2023-02-26 DIAGNOSIS — Z79899 Other long term (current) drug therapy: Secondary | ICD-10-CM | POA: Diagnosis not present

## 2023-03-02 DIAGNOSIS — Z1389 Encounter for screening for other disorder: Secondary | ICD-10-CM | POA: Diagnosis not present

## 2023-03-02 DIAGNOSIS — Z23 Encounter for immunization: Secondary | ICD-10-CM | POA: Diagnosis not present

## 2023-03-02 DIAGNOSIS — Z0000 Encounter for general adult medical examination without abnormal findings: Secondary | ICD-10-CM | POA: Diagnosis not present

## 2023-03-06 ENCOUNTER — Encounter: Payer: Self-pay | Admitting: Dietician

## 2023-03-06 ENCOUNTER — Encounter: Payer: Medicare HMO | Attending: Family Medicine | Admitting: Dietician

## 2023-03-06 DIAGNOSIS — R7303 Prediabetes: Secondary | ICD-10-CM | POA: Insufficient documentation

## 2023-03-06 NOTE — Progress Notes (Signed)
On 03/06/23 completed Session 11 of Diabetes Prevention Program course  with [facility]. By the end of this session patients are able to complete the following objectives:   Learning Objectives: Give examples of negative thoughts that could prevent them from meeting their goals of losing weight and being more physically active.  Describe how to stop negative thoughts and talk back to them with positive thoughts.  Practice 1) stopping negative thoughts and 2) talking back to negative thoughts with positive ones.    Goals:  Record weight taken outside of class.  Track foods and beverages eaten each day in the "Food and Activity Tracker," including calories and fat grams for each item.   Track activity type, minutes you were active, and distance you reached each day in the "Food and Activity Tracker."  If you have any negative thoughts-write them in your Food and Activity Trackers, along with how you talked back to them. Practice stopping negative thoughts and talking back to them with positive thoughts.   Follow-Up Plan: Attend Core Session 12 next week.  Bring completed "Food and Activity Tracker" next week to be reviewed by Lifestyle Coach.  

## 2023-03-13 ENCOUNTER — Encounter: Payer: Self-pay | Admitting: Dietician

## 2023-03-13 ENCOUNTER — Encounter: Payer: Medicare HMO | Admitting: Dietician

## 2023-03-13 DIAGNOSIS — R7303 Prediabetes: Secondary | ICD-10-CM

## 2023-03-13 NOTE — Progress Notes (Signed)
Patient was seen on 03/13/23 for the Core Session 12 of Diabetes Prevention Program course at [facility]. By the end of this session patients are able to complete the following objectives:   Learning Objectives: Describe their current progress toward defined goals. Describe common causes for slipping from healthy eating or being active. Explain what to do to get back on their feet after a slip.  Goals:  Record weight taken outside of class.  Track foods and beverages eaten each day in the "Food and Activity Tracker," including calories and fat grams for each item.   Track activity type, minutes active, and distance reached each day in the "Food and Activity Tracker."  Try out the two action plans created during session- "Slips from Healthy Eating: Action Plan" and "Slips from Being Active: Action Plan" Answer questions on the handout.   Follow-Up Plan: Attend Core Session 13 next week.  Bring completed "Food and Activity Tracker" next week to be reviewed by Lifestyle Coach.  

## 2023-03-19 ENCOUNTER — Other Ambulatory Visit (HOSPITAL_COMMUNITY): Payer: Self-pay

## 2023-03-19 ENCOUNTER — Other Ambulatory Visit: Payer: Self-pay

## 2023-03-20 ENCOUNTER — Other Ambulatory Visit (HOSPITAL_COMMUNITY): Payer: Self-pay

## 2023-03-20 ENCOUNTER — Other Ambulatory Visit: Payer: Self-pay

## 2023-03-23 ENCOUNTER — Ambulatory Visit: Payer: Medicare HMO | Admitting: Podiatry

## 2023-03-23 ENCOUNTER — Encounter: Payer: Self-pay | Admitting: Podiatry

## 2023-03-23 DIAGNOSIS — L600 Ingrowing nail: Secondary | ICD-10-CM

## 2023-03-23 DIAGNOSIS — M792 Neuralgia and neuritis, unspecified: Secondary | ICD-10-CM | POA: Diagnosis not present

## 2023-03-23 DIAGNOSIS — G629 Polyneuropathy, unspecified: Secondary | ICD-10-CM

## 2023-03-23 NOTE — Progress Notes (Signed)
  Subjective:  Patient ID: Julie Gill, female    DOB: 10-25-1941,   MRN: [id number]  Chief Complaint  Patient presents with   Nail Problem     Bilateral great toe ingrowns    82 y.o. female presents for concern of bilateral ingrown toenails. Relates she has had the ingrowns removed in the past but recently started having really bad pain in her toes mostly when walking or in tight shoes. Relates a history of neuropathy and back issues. Relates burning and tinlgling in the feet.  . Denies any other pedal complaints. Denies n/v/f/c.   Past Medical History:  Diagnosis Date   Allergy    Angiodysplasia of cecum 12/23/2021   1-2 mm   Anxiety    Arthritis    Asthma    dx 10 yrs ago   Atrial fibrillation    CHADS VASC score 3. Intolerant to flecainide. s/p afib ablation 02/12/12   Cataract    removed bilaterally   Colon polyps    Diastolic dysfunction    Dysrhythmia    a fib   dx 2014   GERD (gastroesophageal reflux disease)    History of kidney infection     last one was approx 2009   Hyperlipidemia    Hypertension    lung ca dx'd 08/2016   Mild sleep apnea    wears NO equipment   Mitral regurgitation    mild   Neuromuscular disorder    Obesity    Plantar fasciitis    Restless leg syndrome    Sinus bradycardia    Sleep apnea    Stress bladder incontinence, female    WEARS SMALL PADS    Objective:  Physical Exam: Vascular: DP/PT pulses 2/4 bilateral. CFT <3 seconds. Normal hair growth on digits. No edema.  Skin. No lacerations or abrasions bilateral feet. Minimally tender to palpation of bilateral borders of bilateral hallux nails. No incurvation noted. No erythema edema or purulence noted.  Musculoskeletal: MMT 5/5 bilateral lower extremities in DF, PF, Inversion and Eversion. Deceased ROM in DF of ankle joint.  Neurological: Sensation intact to light touch.   Assessment:   1. Neuropathy   2. Neuritis   3. Ingrown toenail      Plan:  Patient was evaluated  and treated and all questions answered. Discussed ingrown toenails etiology and treatment options including procedure for removal vs conservative care.  Not 100% convinced nails are causing the pain as the pain could be nerve relates from her back or neuropathy. Also discussed wanting to wait on procedure until farther out from PE and currently on lovenox.  Debrided nails back in slant back fasion and dispensed toecaps to see if this will help.  Patient to return as needed in the future for possible ingrown procedure   Louann Sjogren, DPM

## 2023-03-27 ENCOUNTER — Encounter: Payer: Medicare HMO | Admitting: Dietician

## 2023-03-27 ENCOUNTER — Encounter: Payer: Self-pay | Admitting: Dietician

## 2023-03-27 DIAGNOSIS — R7303 Prediabetes: Secondary | ICD-10-CM

## 2023-03-27 NOTE — Progress Notes (Signed)
Patient was seen on 03/27/23 for the Core Session 13 of Diabetes Prevention Program course at [facility]. By the end of this session patients are able to complete the following objectives:   Learning Objectives: Describe ways to add interest and variety to their activity plans. Define ?aerobic fitness. Explain the four F.I.T.T. principles (frequency, intensity, time, and type of activity) and how they relate to aerobic fitness.   Goals:  Record weight taken outside of class.  Track foods and beverages eaten each day in the "Food and Activity Tracker," including calories and fat grams for each item.   Track activity type, minutes you were active, and distance you reached each day in the "Food and Activity Tracker."  Do your best to reach activity goal for the week. Use one of the F.I.T.T. principles to jump start workouts. Document activity level on the "To Do Next Week" handout.  Follow-Up Plan: Attend Core Session 14 next week.  Bring completed "Food and Activity Tracker" next week to be reviewed by Lifestyle Coach.  

## 2023-03-31 ENCOUNTER — Ambulatory Visit
Admission: RE | Admit: 2023-03-31 | Discharge: 2023-03-31 | Disposition: A | Discharge status: Home / Self Care | Payer: Medicare HMO | Source: Ambulatory Visit | Attending: Family Medicine | Admitting: Family Medicine

## 2023-03-31 DIAGNOSIS — Z1231 Encounter for screening mammogram for malignant neoplasm of breast: Secondary | ICD-10-CM

## 2023-04-01 ENCOUNTER — Telehealth: Payer: Self-pay | Admitting: Internal Medicine

## 2023-04-01 NOTE — Telephone Encounter (Signed)
Called patient regarding May appointments, patient is notified.  

## 2023-04-03 ENCOUNTER — Encounter: Payer: Medicare HMO | Attending: Family Medicine | Admitting: Dietician

## 2023-04-03 ENCOUNTER — Encounter: Payer: Self-pay | Admitting: Dietician

## 2023-04-03 DIAGNOSIS — R7303 Prediabetes: Secondary | ICD-10-CM | POA: Insufficient documentation

## 2023-04-03 NOTE — Progress Notes (Signed)
Patient was seen on 04/03/23 for the Core Session 14 of Diabetes Prevention Program course at [facility]. By the end of this session patients are able to complete the following objectives:   Learning Objectives: Give examples of problem social cues and helpful social cues.  Explain how to remove problem social cues and add helpful ones.  Describe ways of coping with vacations and social events such as parties, holidays, and visits from relatives and friends.  Create an action plan to change a problem social cue and add a helpful one.   Goals:  Record weight taken outside of class.  Track foods and beverages eaten each day in the "Food and Activity Tracker," including calories and fat grams for each item.   Track activity type, minutes you were active, and distance you reached each day in the "Food and Activity Tracker."  Do your best to reach activity goal for the week. Use action plan created during session to change a problem social cue and add a helpful social cue.  Answer questions regarding success of changing social cues on "To Do Next Week" handout.   Follow-Up Plan: Attend Core Session 15 next week.  Bring completed "Food and Activity Tracker" next week to be reviewed by Lifestyle Coach.  

## 2023-04-10 ENCOUNTER — Encounter (HOSPITAL_COMMUNITY): Payer: Self-pay

## 2023-04-10 ENCOUNTER — Ambulatory Visit (HOSPITAL_COMMUNITY)
Admission: RE | Admit: 2023-04-10 | Discharge: 2023-04-10 | Disposition: A | Discharge status: Home / Self Care | Payer: Medicare HMO | Source: Ambulatory Visit | Attending: Internal Medicine | Admitting: Internal Medicine

## 2023-04-10 ENCOUNTER — Inpatient Hospital Stay: Payer: Medicare HMO | Attending: Internal Medicine

## 2023-04-10 ENCOUNTER — Encounter: Payer: Medicare HMO | Admitting: Dietician

## 2023-04-10 ENCOUNTER — Other Ambulatory Visit: Payer: Self-pay

## 2023-04-10 DIAGNOSIS — Z902 Acquired absence of lung [part of]: Secondary | ICD-10-CM | POA: Insufficient documentation

## 2023-04-10 DIAGNOSIS — C3490 Malignant neoplasm of unspecified part of unspecified bronchus or lung: Secondary | ICD-10-CM | POA: Diagnosis not present

## 2023-04-10 DIAGNOSIS — C3411 Malignant neoplasm of upper lobe, right bronchus or lung: Secondary | ICD-10-CM | POA: Insufficient documentation

## 2023-04-10 DIAGNOSIS — Z90710 Acquired absence of both cervix and uterus: Secondary | ICD-10-CM | POA: Insufficient documentation

## 2023-04-10 DIAGNOSIS — R918 Other nonspecific abnormal finding of lung field: Secondary | ICD-10-CM | POA: Diagnosis not present

## 2023-04-10 DIAGNOSIS — Z90722 Acquired absence of ovaries, bilateral: Secondary | ICD-10-CM | POA: Insufficient documentation

## 2023-04-10 LAB — CBC WITH DIFFERENTIAL (CANCER CENTER ONLY)
Abs Immature Granulocytes: 0.05 10*3/uL (ref 0.00–0.07)
Basophils Absolute: 0 10*3/uL (ref 0.0–0.1)
Basophils Relative: 0 %
Eosinophils Absolute: 0.1 10*3/uL (ref 0.0–0.5)
Eosinophils Relative: 1 %
HCT: 33.8 % — ABNORMAL LOW (ref 36.0–46.0)
Hemoglobin: 10.8 g/dL — ABNORMAL LOW (ref 12.0–15.0)
Immature Granulocytes: 1 %
Lymphocytes Relative: 30 %
Lymphs Abs: 1.8 10*3/uL (ref 0.7–4.0)
MCH: 28.7 pg (ref 26.0–34.0)
MCHC: 32 g/dL (ref 30.0–36.0)
MCV: 89.9 fL (ref 80.0–100.0)
Monocytes Absolute: 0.5 10*3/uL (ref 0.1–1.0)
Monocytes Relative: 8 %
Neutro Abs: 3.7 10*3/uL (ref 1.7–7.7)
Neutrophils Relative %: 60 %
Platelet Count: 252 10*3/uL (ref 150–400)
RBC: 3.76 MIL/uL — ABNORMAL LOW (ref 3.87–5.11)
RDW: 14.2 % (ref 11.5–15.5)
WBC Count: 6.1 10*3/uL (ref 4.0–10.5)
nRBC: 0 % (ref 0.0–0.2)

## 2023-04-10 LAB — CMP (CANCER CENTER ONLY)
ALT: 20 U/L (ref 0–44)
AST: 18 U/L (ref 15–41)
Albumin: 3.9 g/dL (ref 3.5–5.0)
Alkaline Phosphatase: 75 U/L (ref 38–126)
Anion gap: 5 (ref 5–15)
BUN: 31 mg/dL — ABNORMAL HIGH (ref 8–23)
CO2: 26 mmol/L (ref 22–32)
Calcium: 8.6 mg/dL — ABNORMAL LOW (ref 8.9–10.3)
Chloride: 110 mmol/L (ref 98–111)
Creatinine: 0.98 mg/dL (ref 0.44–1.00)
GFR, Estimated: 59 mL/min — ABNORMAL LOW (ref >60)
Glucose, Bld: 107 mg/dL — ABNORMAL HIGH (ref 70–99)
Potassium: 4.8 mmol/L (ref 3.5–5.1)
Sodium: 141 mmol/L (ref 135–145)
Total Bilirubin: 0.2 mg/dL — ABNORMAL LOW (ref 0.3–1.2)
Total Protein: 6.4 g/dL — ABNORMAL LOW (ref 6.5–8.1)

## 2023-04-10 MED ORDER — SODIUM CHLORIDE (PF) 0.9 % IJ SOLN
INTRAMUSCULAR | Status: AC
  Filled 2023-04-10: qty 50

## 2023-04-10 MED ORDER — IOHEXOL 300 MG/ML  SOLN
75.0000 mL | Freq: Once | INTRAMUSCULAR | Status: AC | PRN
  Administered 2023-04-10: 75 mL via INTRAVENOUS

## 2023-04-13 ENCOUNTER — Inpatient Hospital Stay: Payer: Medicare HMO | Admitting: Internal Medicine

## 2023-04-13 ENCOUNTER — Other Ambulatory Visit: Payer: Self-pay

## 2023-04-13 VITALS — BP 155/57 | HR 74 | Temp 97.9°F | Resp 15 | Wt 191.2 lb

## 2023-04-13 DIAGNOSIS — C3490 Malignant neoplasm of unspecified part of unspecified bronchus or lung: Secondary | ICD-10-CM | POA: Diagnosis not present

## 2023-04-13 DIAGNOSIS — Z902 Acquired absence of lung [part of]: Secondary | ICD-10-CM | POA: Diagnosis not present

## 2023-04-13 DIAGNOSIS — C3411 Malignant neoplasm of upper lobe, right bronchus or lung: Secondary | ICD-10-CM | POA: Diagnosis not present

## 2023-04-13 DIAGNOSIS — Z90710 Acquired absence of both cervix and uterus: Secondary | ICD-10-CM | POA: Diagnosis not present

## 2023-04-13 DIAGNOSIS — Z90722 Acquired absence of ovaries, bilateral: Secondary | ICD-10-CM | POA: Diagnosis not present

## 2023-04-13 NOTE — Progress Notes (Signed)
[facility] Telephone:([phone]) [phone]   Fax:([phone]) [phone]  OFFICE PROGRESS NOTE  Julie Dimitri, MD [address] [place] [address]  DIAGNOSIS: Stage IB (T1c, N0, M0) non-small cell lung cancer, adenocarcinoma but the patient also has multifocal groundglass opacities in the lung bilaterally highly concerning for low-grade adenocarcinoma as well diagnosed in November 2017.  Genomic Alteration Identified? KRAS G12V Additional Findings? Microsatellite status MS-Stable Tumor Mutation Burden TMB-Intermediate; 11 Muts/Mb Additional Disease-relevant Genes with No Reportable Alterations Identified? EGFR ALK BRAF MET RET ERBB2 ROS1   PDL1 expression 5%.  PRIOR THERAPY: Status post wedge resection of the right upper lobe on 10/02/2016 under the care of Dr. Dorris Fetch.  CURRENT THERAPY: Observation.  INTERVAL HISTORY: Julie Gill 82 y.o. female returns to the clinic today for follow-up visit accompanied by her husband.  The patient is feeling fine today with no concerning complaints.  She was evaluated in March 2024 for productive cough and shortness of breath and CT angiogram of the chest at that time on February 20, 2023 showed acute pulmonary embolus with tiny embolic burden with no CT evidence of right heart strain.  The patient was on treatment with Xarelto at that time for cardiac condition and her treatment was switched to Lovenox but she has a rough time with the daily injection and she would prefer to be on oral drug again.  She denied having any current chest pain, shortness of breath, cough or hemoptysis.  She has no nausea, vomiting, diarrhea or constipation.  She has no headache or visual changes.  She denied having any fever or chills.  She is here today for evaluation with repeat CT scan of the chest for restaging of her disease.   MEDICAL HISTORY: Past Medical History:  Diagnosis Date   Allergy    Angiodysplasia of cecum 12/23/2021   1-2 mm    Anxiety    Arthritis    Asthma    dx 10 yrs ago   Atrial fibrillation (HCC)    CHADS VASC score 3. Intolerant to flecainide. s/p afib ablation 02/12/12   Cataract    removed bilaterally   Colon polyps    Diastolic dysfunction    Dysrhythmia    a fib   dx 2014   GERD (gastroesophageal reflux disease)    History of kidney infection     last one was approx 2009   Hyperlipidemia    Hypertension    lung ca dx'd 08/2016   Mild sleep apnea    wears NO equipment   Mitral regurgitation    mild   Neuromuscular disorder (HCC)    Obesity    Plantar fasciitis    Restless leg syndrome    Sinus bradycardia    Sleep apnea    Stress bladder incontinence, female    WEARS SMALL PADS    ALLERGIES:  is allergic to codeine and prednisone.  MEDICATIONS:  Current Outpatient Medications  Medication Sig Dispense Refill   acetaminophen (TYLENOL) 500 MG tablet Take 1,000 mg by mouth daily as needed (pain).     calcium carbonate (OS-CAL) 600 MG TABS Take 600 mg by mouth daily.     Cholecalciferol (VITAMIN D PO) Take 2,000 Units by mouth daily.     clonazePAM (KLONOPIN) 0.5 MG tablet Take 0.25 mg by mouth daily as needed for anxiety.     enoxaparin (LOVENOX) 150 MG/ML injection Inject 0.86 mLs (130mg ) into the skin daily. (waste 0.49mls) 30 mL 2  famotidine (PEPCID) 20 MG tablet Take 20 mg by mouth at bedtime.     lisinopril-hydrochlorothiazide (ZESTORETIC) 20-25 MG tablet Take 1 tablet by mouth daily.     lovastatin (MEVACOR) 40 MG tablet Take 80 mg by mouth at bedtime.     MAGNESIUM PO Take 250 mg by mouth daily.     nitroGLYCERIN (NITROSTAT) 0.4 MG SL tablet Place 1 tablet (0.4 mg total) under the tongue every 5 (five) minutes as needed for chest pain. 25 tablet 1   polyethylene glycol (MIRALAX / GLYCOLAX) 17 g packet Take 17 g by mouth daily as needed for moderate constipation.     potassium chloride SA (K-DUR,KLOR-CON) 20 MEQ tablet Take 20 mEq by mouth daily.     senna (SENOKOT) 8.6 MG  tablet Take 1 tablet by mouth daily.     vitamin E 180 MG (400 UNITS) capsule Take 400 Units by mouth daily.      No current facility-administered medications for this visit.    SURGICAL HISTORY:  Past Surgical History:  Procedure Laterality Date   ABDOMINAL HYSTERECTOMY  1992   BSO   APPENDECTOMY     atrial fibrillation ablation  02/12/12   PVI by Dr Johney Frame   ATRIAL FIBRILLATION ABLATION N/A 02/12/2012   Procedure: ATRIAL FIBRILLATION ABLATION;  Surgeon: Hillis Range, MD;  Location: [facility] CATH LAB;  Service: Cardiovascular;  Laterality: N/A;   BLADDER SUSPENSION     CARPAL TUNNEL RELEASE Right 01/26/2020   Procedure: CARPAL TUNNEL RELEASE;  Surgeon: Cindee Salt, MD;  Location: [facility];  Service: Orthopedics;  Laterality: Right;   COLONOSCOPY W/ POLYPECTOMY  1995, 200,2004, 2007, 05/06/2011   adenomas   heels spurs     x 2   TEE WITHOUT CARDIOVERSION  02/11/2012   Procedure: TRANSESOPHAGEAL ECHOCARDIOGRAM (TEE);  Surgeon: Laurey Morale, MD;  Location: [facility];  Service: Cardiovascular;  Laterality: N/A;   TONSILLECTOMY AND ADENOIDECTOMY     VARICOSE VEIN SURGERY     VIDEO ASSISTED THORACOSCOPY (VATS)/WEDGE RESECTION Right 10/02/2016   Procedure: VIDEO ASSISTED THORACOSCOPY (VATS)/WEDGE RESECTION;  Surgeon: Loreli Slot, MD;  Location: [facility];  Service: Thoracic;  Laterality: Right;    REVIEW OF SYSTEMS:  A comprehensive review of systems was negative.   PHYSICAL EXAMINATION: General appearance: alert, cooperative, and no distress Head: Normocephalic, without obvious abnormality, atraumatic Neck: no adenopathy, no JVD, supple, symmetrical, trachea midline, and thyroid not enlarged, symmetric, no tenderness/mass/nodules Lymph nodes: Cervical, supraclavicular, and axillary nodes normal. Resp: clear to auscultation bilaterally Back: symmetric, no curvature. ROM normal. No CVA tenderness. Cardio: regular rate and rhythm, S1, S2 normal, no murmur, click, rub or  gallop GI: soft, non-tender; bowel sounds normal; no masses,  no organomegaly Extremities: extremities normal, atraumatic, no cyanosis or edema  ECOG PERFORMANCE STATUS: 1 - Symptomatic but completely ambulatory  Blood pressure (!) 155/57, pulse 74, temperature 97.9 F (36.6 C), temperature source Oral, resp. rate 15, weight 191 lb 3.2 oz (86.7 kg), SpO2 99 %.  LABORATORY DATA: Lab Results  Component Value Date   WBC 6.1 04/10/2023   HGB 10.8 (L) 04/10/2023   HCT 33.8 (L) 04/10/2023   MCV 89.9 04/10/2023   PLT 252 04/10/2023      Chemistry      Component Value Date/Time   NA 141 04/10/2023 1017   NA 142 09/28/2017 0959   K 4.8 04/10/2023 1017   K 4.4 09/28/2017 0959   CL 110 04/10/2023 1017   CO2 26 04/10/2023 1017  CO2 28 09/28/2017 0959   BUN 31 (H) 04/10/2023 1017   BUN 21.3 09/28/2017 0959   CREATININE 0.98 04/10/2023 1017   CREATININE 0.9 09/28/2017 0959      Component Value Date/Time   CALCIUM 8.6 (L) 04/10/2023 1017   CALCIUM 9.9 09/28/2017 0959   ALKPHOS 75 04/10/2023 1017   ALKPHOS 87 09/28/2017 0959   AST 18 04/10/2023 1017   AST 24 09/28/2017 0959   ALT 20 04/10/2023 1017   ALT 24 09/28/2017 0959   BILITOT 0.2 (L) 04/10/2023 1017   BILITOT 0.33 09/28/2017 0959       RADIOGRAPHIC STUDIES: CT Chest W Contrast  Result Date: 04/13/2023 CLINICAL DATA:  Non-small cell lung cancer. Restaging. * Tracking Code: BO * EXAM: CT CHEST WITH CONTRAST TECHNIQUE: Multidetector CT imaging of the chest was performed during intravenous contrast administration. RADIATION DOSE REDUCTION: This exam was performed according to the departmental dose-optimization program which includes automated exposure control, adjustment of the mA and/or kV according to patient size and/or use of iterative reconstruction technique. CONTRAST:  75mL OMNIPAQUE IOHEXOL 300 MG/ML  SOLN COMPARISON:  10/10/2022 and 02/20/2023. FINDINGS: Cardiovascular: Normal heart size. No pericardial effusion.  Aortic atherosclerosis. Coronary artery calcifications. Mediastinum/Nodes: Thyroid gland, trachea and esophagus are unremarkable. No enlarged mediastinal or hilar lymph nodes. Lungs/Pleura: Postsurgical change from right upper lobe wedge resection. This appears stable when compared with the previous exam. Scarring, volume loss in the right middle lobe and lingula with tree-in-bud nodularity is similar to the previous exam. Stable appearance of scattered, bilateral ground-glass lung nodules. The largest ground-glass nodule is in the posterior right lower lobe measuring 1.3 cm, image 97/7. 6 mm solid nodule in the left lower lobe is unchanged, image 79/7. The previous 5 mm nodule in the posterior left lower lobe (new on the prior exam) is resolved. Upper Abdomen: No acute abnormality. Calcified granuloma identified within the liver. Stable cyst and lateral segment of left hepatic lobe measuring 1.1 cm, image 141/2. Musculoskeletal: No chest wall abnormality. No acute or significant osseous findings. IMPRESSION: 1. Stable CT of the chest. 2. Postsurgical changes from right upper lobe wedge resection are unchanged in the interval. 3. Stable appearance of multifocal ground-glass attenuating nodules which measure up to 1.3 cm. Cannot exclude multifocal pulmonary adenocarcinoma. 4. Stable solid left lower lobe measuring 6 mm is stable in the interval. Resolution of previous 4 mm posterior left lower lobe solid nodule. 5. Coronary artery calcifications. 6. Stable appearance of scarring, volume loss and tree-in-bud nodularity in the right middle lobe and lingula. Findings are favored to represent sequelae of chronic indolent atypical infection such as Mycobacterium avium complex. 7.  Aortic Atherosclerosis (ICD10-I70.0). Electronically Signed   By: Signa Kell M.D.   On: 04/13/2023 09:59   MM 3D SCREENING MAMMOGRAM BILATERAL BREAST  Result Date: 04/01/2023 CLINICAL DATA:  Screening. EXAM: DIGITAL SCREENING BILATERAL  MAMMOGRAM WITH TOMOSYNTHESIS AND CAD TECHNIQUE: Bilateral screening digital craniocaudal and mediolateral oblique mammograms were obtained. Bilateral screening digital breast tomosynthesis was performed. The images were evaluated with computer-aided detection. COMPARISON:  Previous exam(s). ACR Breast Density Category a: The breasts are almost entirely fatty. FINDINGS: There are no findings suspicious for malignancy. IMPRESSION: No mammographic evidence of malignancy. A result letter of this screening mammogram will be mailed directly to the patient. RECOMMENDATION: Screening mammogram in one year. (Code:SM-B-01Y) BI-RADS CATEGORY  1: Negative. Electronically Signed   By: Harmon Pier M.D.   On: 04/01/2023 16:55     ASSESSMENT AND PLAN:  This is a very pleasant 82 years old white female with stage IB non-small cell lung cancer, adenocarcinoma with no actionable mutations and several other groundglass opacities bilaterally.  She is status post wedge resection of the right upper lobe nodule. The patient has been in observation since 2017 with no concerning complaints. The patient is feeling fine today with no concerning complaints. She had repeat CT scan of the chest performed recently that showed a stable disease with stable appearance of the multifocal groundglass attenuation nodules but there was a resolution of the 4 mm nodule that was seen in the posterior left lower lobe on the previous scan. There is no evidence of pulmonary embolism on this scan. I recommended for the patient to continue on observation with repeat CT scan of the chest in 6 months. Regarding her recent diagnosis of pulmonary embolism that has a tiny burden.  She is currently on Lovenox and I recommended for her to continue her treatment with Lovenox for another month then she can switch her treatment to oral Eliquis 5 mg p.o. twice daily.  This will be handled by her primary care physicians since I do not see her at frequent  visits. The patient was advised to call immediately if she has any other concerning symptoms in the interval. The patient voices understanding of current disease status and treatment options and is in agreement with the current care plan.  All questions were answered. The patient knows to call the clinic with any problems, questions or concerns. We can certainly see the patient much sooner if necessary.  Disclaimer: This note was dictated with voice recognition software. Similar sounding words can inadvertently be transcribed and may not be corrected upon review.

## 2023-04-17 ENCOUNTER — Encounter: Payer: Medicare HMO | Admitting: Dietician

## 2023-04-17 ENCOUNTER — Other Ambulatory Visit (HOSPITAL_COMMUNITY): Payer: Self-pay

## 2023-04-17 ENCOUNTER — Encounter: Payer: Self-pay | Admitting: Dietician

## 2023-04-17 DIAGNOSIS — R7303 Prediabetes: Secondary | ICD-10-CM

## 2023-04-17 NOTE — Progress Notes (Signed)
Patient was seen on 04/17/23 for the Core Session 16 of Diabetes Prevention Program course at [facility]. By the end of this session patients are able to complete the following objectives:   Learning Objectives: Measure their progress toward weight and physical activity goals since Session 1.  Develop a plan for improving progress, if their goals have not yet been attained.  Describe ways to stay motivated long-term.   Goals:  Record weight taken outside of class.  Track foods and beverages eaten each day in the "Food and Activity Tracker," including calories and fat grams for each item.   Track activity type, minutes you were active, and distance you reached each day in the "Food and Activity Tracker."  Utilize action plan to help stay motivated and complete questions on "To Do List."   Follow-Up Plan: Attend session 17 in two weeks.  Bring completed "Food and Activity Tracker" next session to be reviewed by Lifestyle Coach. 

## 2023-04-20 ENCOUNTER — Other Ambulatory Visit (HOSPITAL_COMMUNITY): Payer: Self-pay

## 2023-04-20 ENCOUNTER — Other Ambulatory Visit: Payer: Self-pay

## 2023-04-21 ENCOUNTER — Other Ambulatory Visit (HOSPITAL_COMMUNITY): Payer: Self-pay

## 2023-04-28 DIAGNOSIS — R7303 Prediabetes: Secondary | ICD-10-CM | POA: Diagnosis not present

## 2023-04-28 DIAGNOSIS — R7309 Other abnormal glucose: Secondary | ICD-10-CM | POA: Diagnosis not present

## 2023-04-28 DIAGNOSIS — Z79899 Other long term (current) drug therapy: Secondary | ICD-10-CM | POA: Diagnosis not present

## 2023-04-28 DIAGNOSIS — R310 Gross hematuria: Secondary | ICD-10-CM | POA: Diagnosis not present

## 2023-04-28 DIAGNOSIS — D649 Anemia, unspecified: Secondary | ICD-10-CM | POA: Diagnosis not present

## 2023-04-28 DIAGNOSIS — S0083XA Contusion of other part of head, initial encounter: Secondary | ICD-10-CM | POA: Diagnosis not present

## 2023-04-29 DIAGNOSIS — D6869 Other thrombophilia: Secondary | ICD-10-CM | POA: Diagnosis not present

## 2023-04-29 DIAGNOSIS — Z6835 Body mass index (BMI) 35.0-35.9, adult: Secondary | ICD-10-CM | POA: Diagnosis not present

## 2023-04-29 DIAGNOSIS — G8929 Other chronic pain: Secondary | ICD-10-CM | POA: Diagnosis not present

## 2023-04-29 DIAGNOSIS — R7303 Prediabetes: Secondary | ICD-10-CM | POA: Diagnosis not present

## 2023-04-29 DIAGNOSIS — K219 Gastro-esophageal reflux disease without esophagitis: Secondary | ICD-10-CM | POA: Diagnosis not present

## 2023-04-29 DIAGNOSIS — I4891 Unspecified atrial fibrillation: Secondary | ICD-10-CM | POA: Diagnosis not present

## 2023-04-29 DIAGNOSIS — G2581 Restless legs syndrome: Secondary | ICD-10-CM | POA: Diagnosis not present

## 2023-04-29 DIAGNOSIS — Z86711 Personal history of pulmonary embolism: Secondary | ICD-10-CM | POA: Diagnosis not present

## 2023-04-29 DIAGNOSIS — M545 Low back pain, unspecified: Secondary | ICD-10-CM | POA: Diagnosis not present

## 2023-05-01 ENCOUNTER — Encounter: Payer: Self-pay | Admitting: Dietician

## 2023-05-01 ENCOUNTER — Encounter: Payer: Medicare HMO | Admitting: Dietician

## 2023-05-01 DIAGNOSIS — R7303 Prediabetes: Secondary | ICD-10-CM

## 2023-05-01 NOTE — Progress Notes (Signed)
Patient was seen on 05/01/23 for Session 17 of Diabetes Prevention Program course at [facility]. By the end of this session patients are able to complete the following objectives:   Learning Objectives: Identify how to maintain and/or continue working toward program goals for the remainder of the program.  Describe ways that food and activity tracking can assist them in maintaining/reaching program goals.  Identify progress they have made since the beginning of the program.   Goals:  Record weight taken outside of class.  Track foods and beverages eaten each day in the "Food and Activity Tracker," including calories and fat grams for each item.   Track activity type, minutes you were active, and distance you reached each day in the "Food and Activity Tracker."   Follow-Up Plan: Attend session 18 in two weeks.  Bring completed "Food and Activity Trackers" next session to be reviewed by Lifestyle Coach.  

## 2023-05-04 ENCOUNTER — Other Ambulatory Visit (HOSPITAL_BASED_OUTPATIENT_CLINIC_OR_DEPARTMENT_OTHER): Payer: Self-pay

## 2023-05-08 NOTE — Progress Notes (Unsigned)
  Electrophysiology Office Note:   Date:  05/08/2023  ID:  CAMESHIA CRESSMAN, DOB [birth year], MRN [id number]  Primary Cardiologist: None Electrophysiologist: Lanier Prude, MD   History of Present Illness:   [facility] is a 82 y.o. female with h/o PAF, MR, HLD, OSA, obesity,  and HTN seen today for routine electrophysiology followup. Since last being seen in our clinic the patient reports doing ***.  she denies chest pain, palpitations, dyspnea, PND, orthopnea, nausea, vomiting, dizziness, syncope, edema, weight gain, or early satiety.   Review of systems complete and found to be negative unless listed in HPI.   Studies Reviewed:    EKG is ordered today. Personal review shows ***  ***  Risk Assessment/Calculations:   {Does this patient have ATRIAL FIBRILLATION?[phone]} No BP recorded.  {Refresh Note OR Click here to enter BP  :1}***        Physical Exam:   VS:  There were no vitals taken for this visit.   Wt Readings from Last 3 Encounters:  04/13/23 191 lb 3.2 oz (86.7 kg)  02/20/23 192 lb (87.1 kg)  10/14/22 185 lb 5 oz (84.1 kg)     GEN: Well nourished, well developed in no acute distress NECK: No JVD; No carotid bruits CARDIAC: {EPRHYTHM:28826}, no murmurs, rubs, gallops RESPIRATORY:  Clear to auscultation without rales, wheezing or rhonchi  ABDOMEN: Soft, non-tender, non-distended EXTREMITIES:  No edema; No deformity   ASSESSMENT AND PLAN:    PAF EKG today shows *** Continue Xarelto for CHA2DS2/VASc of at least 5  HTN Stable on current regimen         {Click here to Review PMH, Prob List, Meds, Allergies, SHx, FHx  :1}   Follow up with {WUJWJ:19147} {EPFOLLOW WG:95621}  Signed, Graciella Freer, PA-C

## 2023-05-11 ENCOUNTER — Encounter: Payer: Self-pay | Admitting: Student

## 2023-05-11 ENCOUNTER — Ambulatory Visit: Payer: Medicare HMO | Attending: Student | Admitting: Student

## 2023-05-11 VITALS — BP 124/72 | HR 65 | Ht 62.0 in | Wt 192.2 lb

## 2023-05-11 DIAGNOSIS — I480 Paroxysmal atrial fibrillation: Secondary | ICD-10-CM

## 2023-05-11 NOTE — Patient Instructions (Signed)
Medication Instructions:  Your physician recommends that you continue on your current medications as directed. Please refer to the Current Medication list given to you today.  *If you need a refill on your cardiac medications before your next appointment, please call your pharmacy*   Lab Work: None ordered If you have labs (blood work) drawn today and your tests are completely normal, you will receive your results only by: MyChart Message (if you have MyChart) OR A paper copy in the mail If you have any lab test that is abnormal or we need to change your treatment, we will call you to review the results   Follow-Up: At [facility], you and your health needs are our priority.  As part of our continuing mission to provide you with exceptional heart care, we have created designated Provider Care Teams.  These Care Teams include your primary Cardiologist (physician) and Advanced Practice Providers (APPs -  Physician Assistants and Nurse Practitioners) who all work together to provide you with the care you need, when you need it.  Your next appointment:   1 year(s)  Provider:   Cameron Lambert, MD  

## 2023-05-12 ENCOUNTER — Other Ambulatory Visit: Payer: Self-pay | Admitting: Family Medicine

## 2023-05-12 DIAGNOSIS — M47819 Spondylosis without myelopathy or radiculopathy, site unspecified: Secondary | ICD-10-CM

## 2023-05-15 ENCOUNTER — Encounter: Payer: Medicare HMO | Admitting: Dietician

## 2023-05-27 DIAGNOSIS — R2242 Localized swelling, mass and lump, left lower limb: Secondary | ICD-10-CM | POA: Diagnosis not present

## 2023-05-27 DIAGNOSIS — M79605 Pain in left leg: Secondary | ICD-10-CM | POA: Diagnosis not present

## 2023-05-28 ENCOUNTER — Other Ambulatory Visit (HOSPITAL_COMMUNITY): Payer: Self-pay | Admitting: Student-PharmD

## 2023-05-28 ENCOUNTER — Ambulatory Visit (HOSPITAL_COMMUNITY)
Admission: RE | Admit: 2023-05-28 | Discharge: 2023-05-28 | Disposition: A | Discharge status: Home / Self Care | Payer: Medicare HMO | Source: Ambulatory Visit | Attending: Vascular Surgery | Admitting: Vascular Surgery

## 2023-05-28 DIAGNOSIS — I2699 Other pulmonary embolism without acute cor pulmonale: Secondary | ICD-10-CM

## 2023-05-28 NOTE — Progress Notes (Signed)
Patient with history of PE while on anticoagulation 01/2023. She recently developed new LLE swelling and burning and is concerned for DVT. Ordering [place] to rule out DVT per Dr. Randie Heinz. Will be seen in DVT Clinic if positive.

## 2023-06-07 ENCOUNTER — Ambulatory Visit
Admission: RE | Admit: 2023-06-07 | Discharge: 2023-06-07 | Disposition: A | Discharge status: Home / Self Care | Payer: Medicare HMO | Source: Ambulatory Visit | Attending: Family Medicine | Admitting: Family Medicine

## 2023-06-07 DIAGNOSIS — M48061 Spinal stenosis, lumbar region without neurogenic claudication: Secondary | ICD-10-CM | POA: Diagnosis not present

## 2023-06-07 DIAGNOSIS — M47819 Spondylosis without myelopathy or radiculopathy, site unspecified: Secondary | ICD-10-CM

## 2023-06-07 DIAGNOSIS — M4316 Spondylolisthesis, lumbar region: Secondary | ICD-10-CM | POA: Diagnosis not present

## 2023-06-12 ENCOUNTER — Encounter: Payer: Self-pay | Admitting: Dietician

## 2023-06-12 ENCOUNTER — Encounter: Payer: Medicare HMO | Attending: Family Medicine | Admitting: Dietician

## 2023-06-12 DIAGNOSIS — R7303 Prediabetes: Secondary | ICD-10-CM

## 2023-06-12 NOTE — Progress Notes (Signed)
Patient was seen on 06/12/23 for the Diabetes Prevention Program course at [facility]. By the end of this session patients are able to complete the following objectives:   Learning Objectives: Describe the differences between unsaturated, saturated, and trans fat on heart health.  List dietary sources of unsaturated, saturated, and trans fats. Explain ways to reduce intake of saturated fat and replace them with heart healthy fats.  Goals:  Record weight taken outside of class.  Track foods and beverages eaten each day in the "Food and Activity Tracker," including calories and fat grams for each item.   Track activity type, minutes you were active, and distance you reached each day in the "Food and Activity Tracker."   Follow-Up Plan: Attend next session.  Bring completed "Food and Activity Trackers" to next session to be reviewed by Lifestyle Coach.

## 2023-06-24 DIAGNOSIS — H52213 Irregular astigmatism, bilateral: Secondary | ICD-10-CM | POA: Diagnosis not present

## 2023-06-30 DIAGNOSIS — D509 Iron deficiency anemia, unspecified: Secondary | ICD-10-CM | POA: Diagnosis not present

## 2023-06-30 DIAGNOSIS — I2693 Single subsegmental pulmonary embolism without acute cor pulmonale: Secondary | ICD-10-CM | POA: Diagnosis not present

## 2023-07-01 DIAGNOSIS — C3490 Malignant neoplasm of unspecified part of unspecified bronchus or lung: Secondary | ICD-10-CM | POA: Diagnosis not present

## 2023-07-01 DIAGNOSIS — M5416 Radiculopathy, lumbar region: Secondary | ICD-10-CM | POA: Diagnosis not present

## 2023-07-10 ENCOUNTER — Encounter: Payer: Medicare HMO | Admitting: Dietician

## 2023-08-07 ENCOUNTER — Encounter: Payer: Medicare HMO | Admitting: Dietician

## 2023-08-10 DIAGNOSIS — M25561 Pain in right knee: Secondary | ICD-10-CM | POA: Diagnosis not present

## 2023-08-10 DIAGNOSIS — I480 Paroxysmal atrial fibrillation: Secondary | ICD-10-CM | POA: Diagnosis not present

## 2023-08-10 DIAGNOSIS — G8929 Other chronic pain: Secondary | ICD-10-CM | POA: Diagnosis not present

## 2023-08-10 DIAGNOSIS — Z6836 Body mass index (BMI) 36.0-36.9, adult: Secondary | ICD-10-CM | POA: Diagnosis not present

## 2023-08-10 DIAGNOSIS — M48061 Spinal stenosis, lumbar region without neurogenic claudication: Secondary | ICD-10-CM | POA: Diagnosis not present

## 2023-08-10 DIAGNOSIS — R7303 Prediabetes: Secondary | ICD-10-CM | POA: Diagnosis not present

## 2023-08-10 DIAGNOSIS — Z23 Encounter for immunization: Secondary | ICD-10-CM | POA: Diagnosis not present

## 2023-08-10 DIAGNOSIS — Z86711 Personal history of pulmonary embolism: Secondary | ICD-10-CM | POA: Diagnosis not present

## 2023-08-12 DIAGNOSIS — M48061 Spinal stenosis, lumbar region without neurogenic claudication: Secondary | ICD-10-CM | POA: Diagnosis not present

## 2023-08-12 DIAGNOSIS — M11261 Other chondrocalcinosis, right knee: Secondary | ICD-10-CM | POA: Diagnosis not present

## 2023-08-14 ENCOUNTER — Encounter: Payer: Medicare HMO | Admitting: Dietician

## 2023-09-11 ENCOUNTER — Encounter: Payer: Medicare HMO | Admitting: Dietician

## 2023-09-21 DIAGNOSIS — R7303 Prediabetes: Secondary | ICD-10-CM | POA: Diagnosis not present

## 2023-09-21 DIAGNOSIS — Z79899 Other long term (current) drug therapy: Secondary | ICD-10-CM | POA: Diagnosis not present

## 2023-09-21 DIAGNOSIS — I480 Paroxysmal atrial fibrillation: Secondary | ICD-10-CM | POA: Diagnosis not present

## 2023-09-21 DIAGNOSIS — E782 Mixed hyperlipidemia: Secondary | ICD-10-CM | POA: Diagnosis not present

## 2023-09-21 DIAGNOSIS — I2693 Single subsegmental pulmonary embolism without acute cor pulmonale: Secondary | ICD-10-CM | POA: Diagnosis not present

## 2023-09-21 DIAGNOSIS — D509 Iron deficiency anemia, unspecified: Secondary | ICD-10-CM | POA: Diagnosis not present

## 2023-10-04 DIAGNOSIS — U071 COVID-19: Secondary | ICD-10-CM | POA: Diagnosis not present

## 2023-10-09 ENCOUNTER — Encounter: Payer: Medicare HMO | Admitting: Dietician

## 2023-10-12 ENCOUNTER — Ambulatory Visit (HOSPITAL_COMMUNITY)
Admission: RE | Admit: 2023-10-12 | Discharge: 2023-10-12 | Disposition: A | Discharge status: Home / Self Care | Payer: Medicare HMO | Source: Ambulatory Visit | Attending: Internal Medicine | Admitting: Internal Medicine

## 2023-10-12 ENCOUNTER — Inpatient Hospital Stay: Payer: Medicare HMO | Attending: Internal Medicine

## 2023-10-12 DIAGNOSIS — C3490 Malignant neoplasm of unspecified part of unspecified bronchus or lung: Secondary | ICD-10-CM | POA: Insufficient documentation

## 2023-10-12 DIAGNOSIS — J439 Emphysema, unspecified: Secondary | ICD-10-CM | POA: Diagnosis not present

## 2023-10-12 DIAGNOSIS — C3411 Malignant neoplasm of upper lobe, right bronchus or lung: Secondary | ICD-10-CM | POA: Insufficient documentation

## 2023-10-12 DIAGNOSIS — I722 Aneurysm of renal artery: Secondary | ICD-10-CM | POA: Insufficient documentation

## 2023-10-12 DIAGNOSIS — I700 Atherosclerosis of aorta: Secondary | ICD-10-CM | POA: Diagnosis not present

## 2023-10-12 LAB — CBC WITH DIFFERENTIAL (CANCER CENTER ONLY)
Abs Immature Granulocytes: 0.06 10*3/uL (ref 0.00–0.07)
Basophils Absolute: 0 10*3/uL (ref 0.0–0.1)
Basophils Relative: 0 %
Eosinophils Absolute: 0.1 10*3/uL (ref 0.0–0.5)
Eosinophils Relative: 1 %
HCT: 33.1 % — ABNORMAL LOW (ref 36.0–46.0)
Hemoglobin: 10.5 g/dL — ABNORMAL LOW (ref 12.0–15.0)
Immature Granulocytes: 1 %
Lymphocytes Relative: 25 %
Lymphs Abs: 2.1 10*3/uL (ref 0.7–4.0)
MCH: 29.5 pg (ref 26.0–34.0)
MCHC: 31.7 g/dL (ref 30.0–36.0)
MCV: 93 fL (ref 80.0–100.0)
Monocytes Absolute: 0.5 10*3/uL (ref 0.1–1.0)
Monocytes Relative: 6 %
Neutro Abs: 5.6 10*3/uL (ref 1.7–7.7)
Neutrophils Relative %: 67 %
Platelet Count: 306 10*3/uL (ref 150–400)
RBC: 3.56 MIL/uL — ABNORMAL LOW (ref 3.87–5.11)
RDW: 12.9 % (ref 11.5–15.5)
WBC Count: 8.3 10*3/uL (ref 4.0–10.5)
nRBC: 0 % (ref 0.0–0.2)

## 2023-10-12 LAB — CMP (CANCER CENTER ONLY)
ALT: 14 U/L (ref 0–44)
AST: 16 U/L (ref 15–41)
Albumin: 3.9 g/dL (ref 3.5–5.0)
Alkaline Phosphatase: 78 U/L (ref 38–126)
Anion gap: 4 — ABNORMAL LOW (ref 5–15)
BUN: 30 mg/dL — ABNORMAL HIGH (ref 8–23)
CO2: 29 mmol/L (ref 22–32)
Calcium: 9.9 mg/dL (ref 8.9–10.3)
Chloride: 106 mmol/L (ref 98–111)
Creatinine: 1.1 mg/dL — ABNORMAL HIGH (ref 0.44–1.00)
GFR, Estimated: 51 mL/min — ABNORMAL LOW (ref >60)
Glucose, Bld: 111 mg/dL — ABNORMAL HIGH (ref 70–99)
Potassium: 5.2 mmol/L — ABNORMAL HIGH (ref 3.5–5.1)
Sodium: 139 mmol/L (ref 135–145)
Total Bilirubin: 0.2 mg/dL (ref <1.2)
Total Protein: 6.3 g/dL — ABNORMAL LOW (ref 6.5–8.1)

## 2023-10-12 MED ORDER — IOHEXOL 300 MG/ML  SOLN
75.0000 mL | Freq: Once | INTRAMUSCULAR | Status: AC | PRN
  Administered 2023-10-12: 75 mL via INTRAVENOUS

## 2023-10-15 ENCOUNTER — Inpatient Hospital Stay (HOSPITAL_BASED_OUTPATIENT_CLINIC_OR_DEPARTMENT_OTHER): Payer: Medicare HMO | Admitting: Internal Medicine

## 2023-10-15 VITALS — BP 143/58 | HR 77 | Temp 98.0°F | Resp 17 | Ht 62.0 in | Wt 192.8 lb

## 2023-10-15 DIAGNOSIS — C3490 Malignant neoplasm of unspecified part of unspecified bronchus or lung: Secondary | ICD-10-CM

## 2023-10-15 DIAGNOSIS — I722 Aneurysm of renal artery: Secondary | ICD-10-CM | POA: Diagnosis not present

## 2023-10-15 DIAGNOSIS — C3411 Malignant neoplasm of upper lobe, right bronchus or lung: Secondary | ICD-10-CM | POA: Diagnosis not present

## 2023-10-15 NOTE — Progress Notes (Signed)
[facility] [phone]   Fax:([phone]) [phone]  OFFICE PROGRESS NOTE  [name], MD [address] [place] [address]  DIAGNOSIS: Stage IB (T1c, N0, M0) non-small cell lung cancer, adenocarcinoma but the patient also has multifocal groundglass opacities in the lung bilaterally highly concerning for low-grade adenocarcinoma as well diagnosed in November 2017.  Genomic Alteration Identified? KRAS G12V Additional Findings? Microsatellite status MS-Stable Tumor Mutation Burden TMB-Intermediate; 11 Muts/Mb Additional Disease-relevant Genes with No Reportable Alterations Identified? EGFR ALK BRAF MET RET ERBB2 ROS1   PDL1 expression 5%.  PRIOR THERAPY: Status post wedge resection of the right upper lobe on 10/02/2016 under the care of Dr. Dorris Fetch.  CURRENT THERAPY: Observation.  INTERVAL HISTORY: Julie Gill 82 y.o. female returns to the clinic today for follow-up visit accompanied by her husband. Discussed the use of AI scribe software for clinical note transcription with the patient, who gave verbal consent to proceed.  History of Present Illness   The patient, an 82 year old individual with a history of non-small cell lung adenocarcinoma, underwent a lobectomy in November 2017. Since then, she has been under regular surveillance. She reports feeling well with no complaints. She denies experiencing any chest pain, shortness of breath, coughing, or hemoptysis. She also denies any gastrointestinal symptoms such as nausea, vomiting, or diarrhea. She has not experienced any recent weight loss.  The patient has a small aneurysm in the right renal artery, which has not caused any symptoms. However, she has noted discomfort when lying on her right side. The patient's cancer-related scans continue to show ground-glass opacities in the lung, but no further intervention is required at this time.      MEDICAL HISTORY: Past Medical History:   Diagnosis Date   Allergy    Angiodysplasia of cecum 12/23/2021   1-2 mm   Anxiety    Arthritis    Asthma    dx 10 yrs ago   Atrial fibrillation (HCC)    CHADS VASC score 3. Intolerant to flecainide. s/p afib ablation 02/12/12   Cataract    removed bilaterally   Colon polyps    Diastolic dysfunction    Dysrhythmia    a fib   dx 2014   GERD (gastroesophageal reflux disease)    History of kidney infection     last one was approx 2009   Hyperlipidemia    Hypertension    lung ca dx'd 08/2016   Mild sleep apnea    wears NO equipment   Mitral regurgitation    mild   Neuromuscular disorder (HCC)    Obesity    Plantar fasciitis    Restless leg syndrome    Sinus bradycardia    Sleep apnea    Stress bladder incontinence, female    WEARS SMALL PADS    ALLERGIES:  is allergic to codeine and prednisone.  MEDICATIONS:  Current Outpatient Medications  Medication Sig Dispense Refill   acetaminophen (TYLENOL) 500 MG tablet Take 1,000 mg by mouth daily as needed (pain).     calcium carbonate (OS-CAL) 600 MG TABS Take 600 mg by mouth daily.     Cholecalciferol (VITAMIN D PO) Take 2,000 Units by mouth daily.     clonazePAM (KLONOPIN) 0.5 MG tablet Take 0.25 mg by mouth daily as needed for anxiety.     enoxaparin (LOVENOX) 150 MG/ML injection Inject 0.86 mLs (130mg ) into the skin daily. (waste 0.21mls) 30 mL 2   famotidine (PEPCID) 20 MG tablet Take 20  mg by mouth at bedtime.     lisinopril-hydrochlorothiazide (ZESTORETIC) 20-25 MG tablet Take 1 tablet by mouth daily.     lovastatin (MEVACOR) 40 MG tablet Take 80 mg by mouth at bedtime.     MAGNESIUM PO Take 250 mg by mouth daily.     nitroGLYCERIN (NITROSTAT) 0.4 MG SL tablet Place 1 tablet (0.4 mg total) under the tongue every 5 (five) minutes as needed for chest pain. 25 tablet 1   polyethylene glycol (MIRALAX / GLYCOLAX) 17 g packet Take 17 g by mouth daily as needed for moderate constipation.     potassium chloride SA  (K-DUR,KLOR-CON) 20 MEQ tablet Take 20 mEq by mouth daily.     senna (SENOKOT) 8.6 MG tablet Take 1 tablet by mouth daily.     vitamin E 180 MG (400 UNITS) capsule Take 400 Units by mouth daily.      No current facility-administered medications for this visit.    SURGICAL HISTORY:  Past Surgical History:  Procedure Laterality Date   ABDOMINAL HYSTERECTOMY  1992   BSO   APPENDECTOMY     atrial fibrillation ablation  02/12/12   PVI by Dr Johney Frame   ATRIAL FIBRILLATION ABLATION N/A 02/12/2012   Procedure: ATRIAL FIBRILLATION ABLATION;  Surgeon: Hillis Range, MD;  Location: [facility] CATH LAB;  Service: Cardiovascular;  Laterality: N/A;   BLADDER SUSPENSION     CARPAL TUNNEL RELEASE Right 01/26/2020   Procedure: CARPAL TUNNEL RELEASE;  Surgeon: Cindee Salt, MD;  Location: [facility];  Service: Orthopedics;  Laterality: Right;   COLONOSCOPY W/ POLYPECTOMY  1995, 200,2004, 2007, 05/06/2011   adenomas   heels spurs     x 2   TEE WITHOUT CARDIOVERSION  02/11/2012   Procedure: TRANSESOPHAGEAL ECHOCARDIOGRAM (TEE);  Surgeon: Laurey Morale, MD;  Location: [facility] ENDOSCOPY;  Service: Cardiovascular;  Laterality: N/A;   TONSILLECTOMY AND ADENOIDECTOMY     VARICOSE VEIN SURGERY     VIDEO ASSISTED THORACOSCOPY (VATS)/WEDGE RESECTION Right 10/02/2016   Procedure: VIDEO ASSISTED THORACOSCOPY (VATS)/WEDGE RESECTION;  Surgeon: Loreli Slot, MD;  Location: [facility] OR;  Service: Thoracic;  Laterality: Right;    REVIEW OF SYSTEMS:  A comprehensive review of systems was negative.   PHYSICAL EXAMINATION: General appearance: alert, cooperative, and no distress Head: Normocephalic, without obvious abnormality, atraumatic Neck: no adenopathy, no JVD, supple, symmetrical, trachea midline, and thyroid not enlarged, symmetric, no tenderness/mass/nodules Lymph nodes: Cervical, supraclavicular, and axillary nodes normal. Resp: clear to auscultation bilaterally Back: symmetric, no curvature. ROM normal. No CVA  tenderness. Cardio: regular rate and rhythm, S1, S2 normal, no murmur, click, rub or gallop GI: soft, non-tender; bowel sounds normal; no masses,  no organomegaly Extremities: extremities normal, atraumatic, no cyanosis or edema  ECOG PERFORMANCE STATUS: 1 - Symptomatic but completely ambulatory  Blood pressure (!) 143/58, pulse 77, temperature 98 F (36.7 C), temperature source Temporal, resp. rate 17, height 5\' 2"  (1.575 m), weight 192 lb 12.8 oz (87.5 kg), SpO2 100%.  LABORATORY DATA: Lab Results  Component Value Date   WBC 8.3 10/12/2023   HGB 10.5 (L) 10/12/2023   HCT 33.1 (L) 10/12/2023   MCV 93.0 10/12/2023   PLT 306 10/12/2023      Chemistry      Component Value Date/Time   NA 139 10/12/2023 1553   NA 142 09/28/2017 0959   K 5.2 (H) 10/12/2023 1553   K 4.4 09/28/2017 0959   CL 106 10/12/2023 1553   CO2 29 10/12/2023 1553  CO2 28 09/28/2017 0959   BUN 30 (H) 10/12/2023 1553   BUN 21.3 09/28/2017 0959   CREATININE 1.10 (H) 10/12/2023 1553   CREATININE 0.9 09/28/2017 0959      Component Value Date/Time   CALCIUM 9.9 10/12/2023 1553   CALCIUM 9.9 09/28/2017 0959   ALKPHOS 78 10/12/2023 1553   ALKPHOS 87 09/28/2017 0959   AST 16 10/12/2023 1553   AST 24 09/28/2017 0959   ALT 14 10/12/2023 1553   ALT 24 09/28/2017 0959   BILITOT 0.2 10/12/2023 1553   BILITOT 0.33 09/28/2017 0959       RADIOGRAPHIC STUDIES: CT Chest W Contrast  Result Date: 10/15/2023 CLINICAL DATA:  Staging non-small-cell lung cancer. * Tracking Code: BO * EXAM: CT CHEST WITH CONTRAST TECHNIQUE: Multidetector CT imaging of the chest was performed during intravenous contrast administration. RADIATION DOSE REDUCTION: This exam was performed according to the departmental dose-optimization program which includes automated exposure control, adjustment of the mA and/or kV according to patient size and/or use of iterative reconstruction technique. CONTRAST:  75mL OMNIPAQUE IOHEXOL 300 MG/ML  SOLN  COMPARISON:  CT 04/10/2023 and older FINDINGS: Cardiovascular: Heart is nonenlarged. No pericardial effusion. Coronary artery calcifications are seen. The thoracic aorta has a normal course and caliber with scattered calcified atherosclerotic plaque. Mediastinum/Nodes: Preserved thyroid gland. Normal caliber thoracic esophagus. Small hiatal hernia. There are some small less than 1 cm in size in short axis mediastinal lymph nodes, unchanged from previous and not pathologic by size criteria. Greatest subcarinal, paratracheal. No specific abnormal lymph node enlargement identified in the hilum or axillary regions. Lungs/Pleura: Surgical changes in the right lung apex again identified with some nodular tissue in this location which is stable from previous. Areas of adjacent scarring and fibrotic changes as well as pleural thickening. No pleural effusion, pneumothorax consolidation. Both lungs show some small nodules including some solid and ground-glass areas. Specific lesions will be followed for continuity. For example 13 mm lesion in the right lower lobe previously, today on series 5, image 91 measures 13 mm. Left lower lobe nodule which previously measured 6 mm, today on series 5, image 73 measures 6 mm. Other lesions are also stable such as left upper lobe series 5, image 28, 30, right lower lobe image 70 and 63. Lesion superior margin of the middle lobe series 5, image 54. Again few areas are seen there is also an area of reticulonodular changes in the inferior aspect of the middle lobe which is stable. Upper Abdomen: Adrenal glands are preserved in the upper abdomen. There is what appears to be a saccular right renal artery aneurysm at the edge of the imaging field. On coronal series 6, image 90 this measures 9 mm. Recommend dedicated CTA of the abdomen when appropriate to further delineate. There is also a low-attenuation lesion seen in the spleen measuring 14 mm on series 2, image 133. This has not clearly a  benign cyst. Lesion is seen in retrospect but not well defined. Musculoskeletal: Moderate degenerative changes along the spine. IMPRESSION: Stable for surgical changes along the right upper lobe. Stable bilateral ground-glass lung nodules and other small nodules. Recommend continued surveillance. No developing new mass lesion, fluid collection or lymph node enlargement. In the upper abdomen possible right 9 mm renal artery aneurysm. There is also a subtle low-attenuation splenic lesion which is indeterminate. With these findings would recommend additional workup when appropriate including arterial phase abdomen CT scan with multiphase to evaluate the splenic lesion. Aortic Atherosclerosis (ICD10-I70.0) and Emphysema (  ICD10-J43.9). Electronically Signed   By: Karen Kays M.D.   On: 10/15/2023 11:59     ASSESSMENT AND PLAN:  This is a very pleasant 82 years old white female with stage IB non-small cell lung cancer, adenocarcinoma with no actionable mutations and several other groundglass opacities bilaterally.  She is status post wedge resection of the right upper lobe nodule. The patient has been in observation since 2017 with no concerning complaints.  The patient is currently on observation and she is feeling fine with no concerning complaints. She had repeat CT scan of the chest performed recently.  I personally and independently reviewed the scan and discussed the result with the patient and her husband.    Adenocarcinoma of the Lung Status post resection in November 2017. No current symptoms. Recent scan shows no concerning findings, with stable ground-glass opacities. - Continue surveillance with annual scans and labs - Schedule follow-up in one year  Right Renal Artery Aneurysm Incidental 9 mm right renal artery aneurysm. Reports pain when lying on the right side, unclear if related to aneurysm. No immediate intervention required. Advised to monitor symptoms and consult family doctor for  potential vascular surgeon referral. - Inform family doctor about the aneurysm - Consider referral to a vascular surgeon if needed - Monitor for any pain or changes in symptoms  General Health Maintenance Generally in good health with no new complaints. Weight is stable. - Continue routine health maintenance and screenings as per primary care provider's recommendations  Follow-up - Schedule follow-up appointment in one year - Perform scans and labs one week before the follow-up appointment.   The patient voices understanding of current disease status and treatment options and is in agreement with the current care plan.  All questions were answered. The patient knows to call the clinic with any problems, questions or concerns. We can certainly see the patient much sooner if necessary.  Disclaimer: This note was dictated with voice recognition software. Similar sounding words can inadvertently be transcribed and may not be corrected upon review.

## 2023-11-13 ENCOUNTER — Encounter: Payer: Medicare HMO | Admitting: Dietician

## 2023-12-22 ENCOUNTER — Ambulatory Visit: Payer: PPO | Admitting: Podiatry

## 2024-01-29 DIAGNOSIS — M79672 Pain in left foot: Secondary | ICD-10-CM | POA: Diagnosis not present

## 2024-01-29 DIAGNOSIS — M79671 Pain in right foot: Secondary | ICD-10-CM | POA: Diagnosis not present

## 2024-01-29 DIAGNOSIS — M25512 Pain in left shoulder: Secondary | ICD-10-CM | POA: Diagnosis not present

## 2024-01-29 DIAGNOSIS — M25522 Pain in left elbow: Secondary | ICD-10-CM | POA: Diagnosis not present

## 2024-02-04 DIAGNOSIS — Z79899 Other long term (current) drug therapy: Secondary | ICD-10-CM | POA: Diagnosis not present

## 2024-02-04 DIAGNOSIS — E782 Mixed hyperlipidemia: Secondary | ICD-10-CM | POA: Diagnosis not present

## 2024-02-04 DIAGNOSIS — D509 Iron deficiency anemia, unspecified: Secondary | ICD-10-CM | POA: Diagnosis not present

## 2024-02-04 DIAGNOSIS — R7303 Prediabetes: Secondary | ICD-10-CM | POA: Diagnosis not present

## 2024-02-08 ENCOUNTER — Encounter: Payer: Self-pay | Admitting: Cardiology

## 2024-02-08 ENCOUNTER — Telehealth: Payer: Self-pay | Admitting: Cardiology

## 2024-02-08 NOTE — Telephone Encounter (Signed)
  Pt c/o of Chest Pain: STAT if active CP, including tightness, pressure, jaw pain, radiating pain to shoulder/upper arm/back, CP unrelieved by Nitro. Symptoms reported of SOB, nausea, vomiting, sweating.  1. Are you having CP right now?   Patient stated she has had 3-4 "twitches around her heart" (sharp darting pain) within in week, last one was last night (around 4:00 am)  2. Are you experiencing any other symptoms (ex. SOB, nausea, vomiting, sweating)?  No  3. Is your CP continuous or coming and going?  Coming and going  4. Have you taken Nitroglycerin?  No  5. How long have you been experiencing CP?   Started about a week ago   6. If NO CP at time of call then end call with telling Pt to call back or call 911 if Chest pain returns prior to return call from triage team.   Patient is concerned she will be going on a cruise and wants to know if this will be safe for her.  Patient stated she had stopped her blood thinner for 4 days to receive a shot in her spine 2 weeks ago.

## 2024-02-08 NOTE — Telephone Encounter (Signed)
 Error

## 2024-02-08 NOTE — Telephone Encounter (Signed)
 Spoke with pt last night noted 2 episodes "twitching pain "  awoke pt  and also had another 2 episodes the previous week  no other symptoms .Pt is due to go on cruise on the 29th and wanted to make sure was ok to travel .Pt did mention last Monday had spinal injection and prior to that had held Eliquis for said procedure Will forward to Dr Lalla Brothers for review and recommendations ./cy

## 2024-02-10 DIAGNOSIS — M48061 Spinal stenosis, lumbar region without neurogenic claudication: Secondary | ICD-10-CM | POA: Diagnosis not present

## 2024-02-10 DIAGNOSIS — I480 Paroxysmal atrial fibrillation: Secondary | ICD-10-CM | POA: Diagnosis not present

## 2024-02-10 DIAGNOSIS — R7303 Prediabetes: Secondary | ICD-10-CM | POA: Diagnosis not present

## 2024-02-10 DIAGNOSIS — Z23 Encounter for immunization: Secondary | ICD-10-CM | POA: Diagnosis not present

## 2024-02-10 DIAGNOSIS — K219 Gastro-esophageal reflux disease without esophagitis: Secondary | ICD-10-CM | POA: Diagnosis not present

## 2024-02-10 DIAGNOSIS — E782 Mixed hyperlipidemia: Secondary | ICD-10-CM | POA: Diagnosis not present

## 2024-02-10 DIAGNOSIS — J302 Other seasonal allergic rhinitis: Secondary | ICD-10-CM | POA: Diagnosis not present

## 2024-02-10 DIAGNOSIS — F419 Anxiety disorder, unspecified: Secondary | ICD-10-CM | POA: Diagnosis not present

## 2024-02-10 DIAGNOSIS — Z6835 Body mass index (BMI) 35.0-35.9, adult: Secondary | ICD-10-CM | POA: Diagnosis not present

## 2024-02-10 DIAGNOSIS — I10 Essential (primary) hypertension: Secondary | ICD-10-CM | POA: Diagnosis not present

## 2024-02-10 DIAGNOSIS — D509 Iron deficiency anemia, unspecified: Secondary | ICD-10-CM | POA: Diagnosis not present

## 2024-02-12 ENCOUNTER — Encounter: Payer: Medicare HMO | Admitting: Dietician

## 2024-02-24 ENCOUNTER — Other Ambulatory Visit (HOSPITAL_COMMUNITY): Payer: Self-pay

## 2024-03-14 ENCOUNTER — Other Ambulatory Visit: Payer: Self-pay | Admitting: Family Medicine

## 2024-03-14 DIAGNOSIS — Z1231 Encounter for screening mammogram for malignant neoplasm of breast: Secondary | ICD-10-CM

## 2024-03-15 DIAGNOSIS — M47816 Spondylosis without myelopathy or radiculopathy, lumbar region: Secondary | ICD-10-CM | POA: Diagnosis not present

## 2024-03-30 DIAGNOSIS — M5412 Radiculopathy, cervical region: Secondary | ICD-10-CM | POA: Diagnosis not present

## 2024-04-01 ENCOUNTER — Ambulatory Visit
Admission: RE | Admit: 2024-04-01 | Discharge: 2024-04-01 | Disposition: A | Discharge status: Home / Self Care | Payer: Self-pay | Source: Ambulatory Visit | Attending: Family Medicine | Admitting: Family Medicine

## 2024-04-01 DIAGNOSIS — Z1231 Encounter for screening mammogram for malignant neoplasm of breast: Secondary | ICD-10-CM

## 2024-04-04 DIAGNOSIS — M47816 Spondylosis without myelopathy or radiculopathy, lumbar region: Secondary | ICD-10-CM | POA: Diagnosis not present

## 2024-04-06 DIAGNOSIS — M5412 Radiculopathy, cervical region: Secondary | ICD-10-CM | POA: Diagnosis not present

## 2024-04-12 DIAGNOSIS — M5412 Radiculopathy, cervical region: Secondary | ICD-10-CM | POA: Diagnosis not present

## 2024-04-21 DIAGNOSIS — M47816 Spondylosis without myelopathy or radiculopathy, lumbar region: Secondary | ICD-10-CM | POA: Diagnosis not present

## 2024-04-29 DIAGNOSIS — M5412 Radiculopathy, cervical region: Secondary | ICD-10-CM | POA: Diagnosis not present

## 2024-05-05 DIAGNOSIS — M47816 Spondylosis without myelopathy or radiculopathy, lumbar region: Secondary | ICD-10-CM | POA: Diagnosis not present

## 2024-05-12 NOTE — Progress Notes (Deleted)
 Cardiology Office Note:  .   Date:  05/12/2024  ID:  Julie Gill, DOB Oct 31, 1941, MRN [id number] PCP: Helyn Lobstein, MD  [facility] Providers Cardiologist:  None Electrophysiologist:  Boyce Byes, MD {  History of Present Illness: Julie Gill   Julie Gill is a 83 y.o. female w/PMHx of  HTN, HLD, RUL wedge resection 2/2 lung Ca AFib  She saw Dr. Marven Slimmer 2023  More recently Julie Gill 05/11/23, suffered PE recently was maintaining SR PE felt to be failure of Xarelto  was on lovenox  with plans to transition to Eliquis   Today's visit is scheduled as an annual visit ROS:   *** lovenox  ?? *** burden *** symptoms   Arrhythmia/AAD hx Initial diagnosis of AFib ~ over a decade ago AFib ablation 02/12/2012, Additional ablation was performed at the junction of the right atrium and SVC. (Dr. Nunzio Belch)  Hx of  flecainide  > intolerant 2/2 headaches (2013) Dofetilide  > expensive, reported hair loss > (2013)  Xarelto  >> acute PE > lovenox   Studies Reviewed: Julie Gill    EKG done today and reviewed by myself:  ***  02/21/23: TTE 1. Left ventricular ejection fraction, by estimation, is 60 to 65%. The  left ventricle has normal function. The left ventricle has no regional  wall motion abnormalities. There is moderate left ventricular hypertrophy.  Left ventricular diastolic  parameters were normal.   2. Right ventricular systolic function is normal. The right ventricular  size is normal. There is moderately elevated pulmonary artery systolic  pressure.   3. Left atrial size was mildly dilated.   4. The mitral valve is abnormal. Trivial mitral valve regurgitation. No  evidence of mitral stenosis. Moderate mitral annular calcification.   5. The aortic valve is tricuspid. There is moderate calcification of the  aortic valve. There is moderate thickening of the aortic valve. Aortic  valve regurgitation is not visualized. Aortic valve sclerosis is present,  with no evidence of aortic valve   stenosis.   6. The inferior vena cava is normal in size with greater than 50%  respiratory variability, suggesting right atrial pressure of 3 mmHg.    04/02/2021  Echo:  1. Left ventricular ejection fraction, by estimation, is 60 to 65%. The  left ventricle has normal function. The left ventricle has no regional  wall motion abnormalities. Left ventricular diastolic parameters were  normal.   2. Right ventricular systolic function is normal. The right ventricular  size is normal. Tricuspid regurgitation signal is inadequate for assessing  PA pressure.   3. The mitral valve is degenerative. Trivial mitral valve regurgitation.  No evidence of mitral stenosis.   4. The aortic valve is tricuspid. There is mild calcification of the  aortic valve. Aortic valve regurgitation is not visualized. Mild aortic  valve sclerosis is present, with no evidence of aortic valve stenosis.   5. The inferior vena cava is normal in size with greater than 50%  respiratory variability, suggesting right atrial pressure of 3 mmHg.   Comparison(s): No significant change from prior study.    03/12/2021  Lexiscan  Myoview : The left ventricular ejection fraction is hyperdynamic (>65%). Nuclear stress EF: 72%. There was no ST segment deviation noted during stress. No T wave inversion was noted during stress. There is a small apical septal defect which is likely artifact related RV insertion site. Normal wall motion in this region. The study is normal. This is a low risk study.    02/12/2012: EPS/ablation CONCLUSIONS: 1. Sinus rhythm upon presentation.  2. Rotational Angiography reveals a moderate sized left atrium with four separate pulmonary veins without evidence of pulmonary vein stenosis. 3. Successful electrical isolation and anatomical encircling of all four pulmonary veins with radiofrequency current.   Additional ablation was performed at the junction of the right atrium and SVC. 4. No inducible  arrhythmias following ablation both on and off of Isuprel  5. No early apparent complications.     Risk Assessment/Calculations:    Physical Exam:   VS:  There were no vitals taken for this visit.   Wt Readings from Last 3 Encounters:  10/15/23 192 lb 12.8 oz (87.5 kg)  05/11/23 192 lb 3.2 oz (87.2 kg)  04/13/23 191 lb 3.2 oz (86.7 kg)    GEN: Well nourished, well developed in no acute distress NECK: No JVD; No carotid bruits CARDIAC: ***RRR, no murmurs, rubs, gallops RESPIRATORY:  *** CTA b/l without rales, wheezing or rhonchi  ABDOMEN: Soft, non-tender, non-distended EXTREMITIES:  No edema; No deformity   ASSESSMENT AND PLAN: .    paroxysmal AFib CHA2DS2Vasc is 6, on *** *** burden by symptoms  HTN ***  Secondary hypercoagulable state 2/2 AFib     {Are you ordering a CV Procedure (e.g. stress test, cath, DCCV, TEE, etc)?   Press F2        :[id number]}     Dispo: ***  Signed, Debbie Fails, PA-C

## 2024-05-13 ENCOUNTER — Ambulatory Visit: Payer: Self-pay | Admitting: Physician Assistant

## 2024-05-16 DIAGNOSIS — J029 Acute pharyngitis, unspecified: Secondary | ICD-10-CM | POA: Diagnosis not present

## 2024-05-16 DIAGNOSIS — R0989 Other specified symptoms and signs involving the circulatory and respiratory systems: Secondary | ICD-10-CM | POA: Diagnosis not present

## 2024-05-16 DIAGNOSIS — R509 Fever, unspecified: Secondary | ICD-10-CM | POA: Diagnosis not present

## 2024-05-16 DIAGNOSIS — R051 Acute cough: Secondary | ICD-10-CM | POA: Diagnosis not present

## 2024-05-20 ENCOUNTER — Other Ambulatory Visit: Payer: Self-pay

## 2024-05-20 ENCOUNTER — Emergency Department (HOSPITAL_BASED_OUTPATIENT_CLINIC_OR_DEPARTMENT_OTHER)
Admission: EM | Admit: 2024-05-20 | Discharge: 2024-05-20 | Disposition: A | Discharge status: Home / Self Care | Attending: Emergency Medicine | Admitting: Emergency Medicine

## 2024-05-20 ENCOUNTER — Encounter (HOSPITAL_BASED_OUTPATIENT_CLINIC_OR_DEPARTMENT_OTHER): Payer: Self-pay | Admitting: *Deleted

## 2024-05-20 ENCOUNTER — Emergency Department (HOSPITAL_BASED_OUTPATIENT_CLINIC_OR_DEPARTMENT_OTHER)

## 2024-05-20 DIAGNOSIS — R0602 Shortness of breath: Secondary | ICD-10-CM | POA: Diagnosis not present

## 2024-05-20 DIAGNOSIS — R059 Cough, unspecified: Secondary | ICD-10-CM | POA: Diagnosis not present

## 2024-05-20 DIAGNOSIS — Z85118 Personal history of other malignant neoplasm of bronchus and lung: Secondary | ICD-10-CM | POA: Diagnosis not present

## 2024-05-20 DIAGNOSIS — I10 Essential (primary) hypertension: Secondary | ICD-10-CM | POA: Diagnosis not present

## 2024-05-20 DIAGNOSIS — I700 Atherosclerosis of aorta: Secondary | ICD-10-CM | POA: Diagnosis not present

## 2024-05-20 DIAGNOSIS — R062 Wheezing: Secondary | ICD-10-CM | POA: Diagnosis not present

## 2024-05-20 DIAGNOSIS — J209 Acute bronchitis, unspecified: Secondary | ICD-10-CM

## 2024-05-20 DIAGNOSIS — Z79899 Other long term (current) drug therapy: Secondary | ICD-10-CM | POA: Diagnosis not present

## 2024-05-20 LAB — CBC WITH DIFFERENTIAL/PLATELET
Abs Immature Granulocytes: 0.02 10*3/uL (ref 0.00–0.07)
Basophils Absolute: 0 10*3/uL (ref 0.0–0.1)
Basophils Relative: 0 %
Eosinophils Absolute: 0.1 10*3/uL (ref 0.0–0.5)
Eosinophils Relative: 2 %
HCT: 33.7 % — ABNORMAL LOW (ref 36.0–46.0)
Hemoglobin: 10.7 g/dL — ABNORMAL LOW (ref 12.0–15.0)
Immature Granulocytes: 0 %
Lymphocytes Relative: 27 %
Lymphs Abs: 2.3 10*3/uL (ref 0.7–4.0)
MCH: 30.3 pg (ref 26.0–34.0)
MCHC: 31.8 g/dL (ref 30.0–36.0)
MCV: 95.5 fL (ref 80.0–100.0)
Monocytes Absolute: 0.5 10*3/uL (ref 0.1–1.0)
Monocytes Relative: 6 %
Neutro Abs: 5.4 10*3/uL (ref 1.7–7.7)
Neutrophils Relative %: 65 %
Platelets: 279 10*3/uL (ref 150–400)
RBC: 3.53 MIL/uL — ABNORMAL LOW (ref 3.87–5.11)
RDW: 13 % (ref 11.5–15.5)
WBC: 8.5 10*3/uL (ref 4.0–10.5)
nRBC: 0 % (ref 0.0–0.2)

## 2024-05-20 LAB — BASIC METABOLIC PANEL WITH GFR
Anion gap: 11 (ref 5–15)
BUN: 24 mg/dL — ABNORMAL HIGH (ref 8–23)
CO2: 25 mmol/L (ref 22–32)
Calcium: 9.1 mg/dL (ref 8.9–10.3)
Chloride: 102 mmol/L (ref 98–111)
Creatinine, Ser: 0.89 mg/dL (ref 0.44–1.00)
GFR, Estimated: >60 mL/min (ref >60)
Glucose, Bld: 115 mg/dL — ABNORMAL HIGH (ref 70–99)
Potassium: 4.8 mmol/L (ref 3.5–5.1)
Sodium: 137 mmol/L (ref 135–145)

## 2024-05-20 LAB — PRO BRAIN NATRIURETIC PEPTIDE: Pro Brain Natriuretic Peptide: 122 pg/mL (ref <300.0)

## 2024-05-20 MED ORDER — IPRATROPIUM-ALBUTEROL 0.5-2.5 (3) MG/3ML IN SOLN
RESPIRATORY_TRACT | Status: AC
  Administered 2024-05-20: 3 mL via RESPIRATORY_TRACT
  Filled 2024-05-20: qty 3

## 2024-05-20 MED ORDER — ALBUTEROL SULFATE (2.5 MG/3ML) 0.083% IN NEBU: 2.5000 mg | INHALATION_SOLUTION | Freq: Once | RESPIRATORY_TRACT | Status: AC

## 2024-05-20 MED ORDER — ALBUTEROL SULFATE (2.5 MG/3ML) 0.083% IN NEBU
2.5000 mg | INHALATION_SOLUTION | Freq: Once | RESPIRATORY_TRACT | Status: AC
  Administered 2024-05-20: 2.5 mg via RESPIRATORY_TRACT
  Filled 2024-05-20: qty 3

## 2024-05-20 MED ORDER — ALBUTEROL SULFATE (2.5 MG/3ML) 0.083% IN NEBU
INHALATION_SOLUTION | RESPIRATORY_TRACT | Status: AC
  Administered 2024-05-20: 2.5 mg via RESPIRATORY_TRACT
  Filled 2024-05-20: qty 3

## 2024-05-20 MED ORDER — METHYLPREDNISOLONE SODIUM SUCC 125 MG IJ SOLR
125.0000 mg | Freq: Once | INTRAMUSCULAR | Status: AC
  Administered 2024-05-20: 125 mg via INTRAVENOUS
  Filled 2024-05-20: qty 2

## 2024-05-20 MED ORDER — IPRATROPIUM-ALBUTEROL 0.5-2.5 (3) MG/3ML IN SOLN
3.0000 mL | Freq: Once | RESPIRATORY_TRACT | Status: AC
  Administered 2024-05-20: 3 mL via RESPIRATORY_TRACT
  Filled 2024-05-20: qty 3

## 2024-05-20 MED ORDER — ALBUTEROL SULFATE HFA 108 (90 BASE) MCG/ACT IN AERS
2.0000 | INHALATION_SPRAY | RESPIRATORY_TRACT | Status: DC | PRN
  Administered 2024-05-20: 2 via RESPIRATORY_TRACT
  Filled 2024-05-20: qty 6.7

## 2024-05-20 MED ORDER — AEROCHAMBER PLUS FLO-VU MISC
1.0000 | Freq: Once | Status: AC
  Administered 2024-05-20: 1
  Filled 2024-05-20: qty 1

## 2024-05-20 MED ORDER — IPRATROPIUM-ALBUTEROL 0.5-2.5 (3) MG/3ML IN SOLN: 3.0000 mL | Freq: Once | RESPIRATORY_TRACT | Status: AC

## 2024-05-20 NOTE — ED Notes (Signed)
 RT educated pt on proper use of MDI w/spacer. Pt able to perform w/out difficulty. RT provided d/c education on MDI use w/spacer as well as information on PFT. Pt respiratory status stable on RA w/no distress noted at this time. BLBS clr/dim.    05/20/24 0402  Aerosol Therapy Tx  $ Hand Held Nebulizer  1  Medications Albuterol   Delivery Device MDI  Pre-Treatment Pulse 92  Pre-Treatment Respirations 20  Post-Treatment Pulse 92  Post-Treatment Respirations 23  Treatment Tolerance Tolerated well  Treatment Given 1  RT Breath Sounds  Bilateral Breath Sounds Clear;Diminished  R Upper  Breath Sounds Clear  L Upper Breath Sounds Clear  R Lower Breath Sounds Diminished  L Lower Breath Sounds Diminished

## 2024-05-20 NOTE — ED Triage Notes (Signed)
 Pt arrived from home for sob since Monday. Was seen on Monday by PCP, prescribed antibiotics, no xray done at that time. Hx of lung cancer, has known nodules on lungs, one was removed. Reports abd and chest soreness from coughing, nonproductive. Increased work of breathing and wheezing noted on arrival in room.

## 2024-05-20 NOTE — ED Provider Notes (Signed)
 [place] EMERGENCY DEPARTMENT AT [facility] Provider Note   CSN: [id number] Arrival date & time: 05/20/24  0209     Patient presents with: Shortness of Breath   Julie Gill is a 83 y.o. female.   Patient is an 83 year old female with past medical history of lung cancer, hypertension, paroxysmal A-fib.  Patient presenting today with complaints of shortness of breath.  She has been having chest congestion and cough for the past several days.  She was seen by her primary doctor and prescribed an antibiotic which she has been on for the past 2 days.  Cough is intermittently productive.  No fevers or chills.  No chest pain.  She denies any leg swelling or calf pain.       Prior to Admission medications   Medication Sig Start Date End Date Taking? Authorizing Provider  acetaminophen  (TYLENOL ) 500 MG tablet Take 1,000 mg by mouth daily as needed (pain).    [provider]  calcium  carbonate (OS-CAL) 600 MG TABS Take 600 mg by mouth daily.    [provider]  Cholecalciferol  (VITAMIN D PO) Take 2,000 Units by mouth daily.    [provider]  clonazePAM  (KLONOPIN ) 0.5 MG tablet Take 0.25 mg by mouth daily as needed for anxiety.    [provider]  enoxaparin  (LOVENOX ) 150 MG/ML injection Inject 0.86 mLs (130mg ) into the skin daily. (waste 0.7mls) 02/23/23   Barbee Lew, MD  famotidine  (PEPCID ) 20 MG tablet Take 20 mg by mouth at bedtime.    [provider]  lisinopril -hydrochlorothiazide  (ZESTORETIC ) 20-25 MG tablet Take 1 tablet by mouth daily. 06/17/21   [provider]  lovastatin (MEVACOR) 40 MG tablet Take 80 mg by mouth at bedtime. 09/11/12   [provider]  MAGNESIUM  PO Take 250 mg by mouth daily.    [provider]  nitroGLYCERIN  (NITROSTAT ) 0.4 MG SL tablet Place 1 tablet (0.4 mg total) under the tongue every 5 (five) minutes as needed for chest pain. 08/13/20   Allred, Royston Cornea, MD  polyethylene  glycol (MIRALAX  / GLYCOLAX ) 17 g packet Take 17 g by mouth daily as needed for moderate constipation.    [provider]  potassium chloride  SA (K-DUR,KLOR-CON ) 20 MEQ tablet Take 20 mEq by mouth daily.    [provider]  senna (SENOKOT) 8.6 MG tablet Take 1 tablet by mouth daily.    [provider]  vitamin E  180 MG (400 UNITS) capsule Take 400 Units by mouth daily.     [provider]    Allergies: Codeine and Prednisone    Review of Systems  All other systems reviewed and are negative.   Updated Vital Signs BP (!) 165/86   Pulse 83   Temp 98.9 F (37.2 C) (Oral)   Resp (!) 21   SpO2 98%   Physical Exam Vitals and nursing note reviewed.  Constitutional:      General: She is not in acute distress.    Appearance: She is well-developed. She is not diaphoretic.  HENT:     Head: Normocephalic and atraumatic.   Cardiovascular:     Rate and Rhythm: Normal rate and regular rhythm.     Heart sounds: No murmur heard.    No friction rub. No gallop.  Pulmonary:     Effort: Pulmonary effort is normal. No respiratory distress.     Breath sounds: Examination of the right-middle field reveals rhonchi. Examination of the left-middle field reveals rhonchi. Rhonchi present. No  wheezing.  Abdominal:     General: Bowel sounds are normal. There is no distension.     Palpations: Abdomen is soft.     Tenderness: There is no abdominal tenderness.   Musculoskeletal:        General: Normal range of motion.     Cervical back: Normal range of motion and neck supple.   Skin:    General: Skin is warm and dry.   Neurological:     General: No focal deficit present.     Mental Status: She is alert and oriented to person, place, and time.     (all labs ordered are listed, but only abnormal results are displayed) Labs Reviewed  BASIC METABOLIC PANEL WITH GFR  CBC WITH DIFFERENTIAL/PLATELET  PRO BRAIN NATRIURETIC PEPTIDE    EKG: EKG  Interpretation Date/Time:  Friday May 20 2024 02:19:17 EDT Ventricular Rate:  89 PR Interval:  142 QRS Duration:  90 QT Interval:  380 QTC Calculation: 463 R Axis:   28  Text Interpretation: Sinus rhythm Normal ECG Confirmed by Orvilla Blander ([id number]) on 05/20/2024 2:22:17 AM  Radiology: DG Chest Portable 1 View Result Date: 05/20/2024 EXAM: 1 VIEW XRAY OF THE CHEST 05/20/2024 02:25:36 AM COMPARISON: CT chest dated 10/12/2023. CLINICAL HISTORY: Cough. Pt arrived from home for SOB since Monday. Was seen on Monday by PCP, prescribed antibiotics, no xray done at that time. Hx of lung cancer, has known nodules on lungs, one was removed. Reports abd and chest soreness from coughing, nonproductive. Increased work of breathing and wheezing noted on arrival in room. FINDINGS: LUNGS AND PLEURA: No focal pulmonary opacity. No pulmonary edema. No pleural effusion. No pneumothorax. Postsurgical changes related to right upper lobe wedge resection. HEART AND MEDIASTINUM: No acute abnormality of the cardiac and mediastinal silhouettes. Thoracic aortic atherosclerosis. BONES AND SOFT TISSUES: No acute osseous abnormality. IMPRESSION: 1. No acute findings. Electronically signed by: Zadie Herter MD 05/20/2024 02:28 AM EDT RP Workstation: [id number]     Procedures   Medications Ordered in the ED  methylPREDNISolone sodium succinate (SOLU-MEDROL) 125 mg/2 mL injection 125 mg (has no administration in time range)  ipratropium-albuterol  (DUONEB) 0.5-2.5 (3) MG/3ML nebulizer solution 3 mL (3 mLs Nebulization Given 05/20/24 0225)  albuterol  (PROVENTIL ) (2.5 MG/3ML) 0.083% nebulizer solution 2.5 mg (2.5 mg Nebulization Given 05/20/24 0225)                                    Medical Decision Making Amount and/or Complexity of Data Reviewed Labs: ordered. Radiology: ordered.  Risk Prescription drug management.   Patient is a an 83 year old female presenting with chest congestion and cough ongoing for the  past several days.  She was seen by her primary doctor and started on antibiotics, however cough and shortness of breath worsened this evening.  Patient arrives here with stable vital signs and is afebrile.  She is in no respiratory distress.  Laboratory studies obtained including CBC, basic metabolic panel, and BNP.  All studies are basically unremarkable.  Chest x-ray shows no acute findings.  Patient has received multiple breathing treatments along with Solu-Medrol and reports significant improvement.  I feel as though patient can safely be discharged.  I will have her continue the antibiotics and prescribed an albuterol  inhaler.  She is to follow-up as needed if not improving.  Suspect symptoms related to bronchitis/reactive airway/early COPD.     Final diagnoses:  None  ED Discharge Orders     None          Orvilla Blander, MD 05/20/24 [phone]

## 2024-05-20 NOTE — Discharge Instructions (Signed)
 Continue antibiotics as previously prescribed.  Use the albuterol  inhaler, 2 puffs every 4 hours as needed for wheezing/coughing.  Follow-up with primary doctor if not improving in the next few days, and return to the ER if symptoms significantly worsen or change.

## 2024-06-12 NOTE — Progress Notes (Unsigned)
 Cardiology Office Note:  .   Date:  06/12/2024  ID:  Julie Gill, DOB 1941-07-15, MRN [id number] PCP: Julie Harvey, MD  [facility] Providers Cardiologist:  None Electrophysiologist:  Julie ONEIDA HOLTS, MD {  History of Present Illness: Julie   Julie Gill is a 83 y.o. female w/PMHx of  HTN, HLD, RUL wedge resection 2/2 lung Ca AFib  She saw Dr. HOLTS 2023  More recently Julie Gill 05/11/23, suffered PE recently was maintaining SR PE felt to be failure of Xarelto  was on lovenox  with plans to transition to Eliquis   Today's visit is scheduled as an annual visit ROS:   She is doing very well In the last year she has had 5 episodes of Afib (minutes), all very brief, no clear trigger She gets a random, fleeting sharp CP, lasts less then a second, none otherwise No dizzy spells, near syncope or syncope No SOB Active but doesn't exercise Bruises easily on her arms, no bleeding or signs of bleeding   Arrhythmia/AAD hx Initial diagnosis of AFib ~ over a decade ago AFib ablation 02/12/2012, Additional ablation was performed at the junction of the right atrium and SVC. (Dr. Kelsie)  Hx of  flecainide  > intolerant 2/2 headaches (2013) Dofetilide  > expensive, reported hair loss > (2013)  Xarelto  >> acute PE > lovenox   Studies Reviewed: Julie    EKG done 05/20/24 reviewed by myself:  SR, motion artifact, 89bpm  02/21/23: TTE 1. Left ventricular ejection fraction, by estimation, is 60 to 65%. The  left ventricle has normal function. The left ventricle has no regional  wall motion abnormalities. There is moderate left ventricular hypertrophy.  Left ventricular diastolic  parameters were normal.   2. Right ventricular systolic function is normal. The right ventricular  size is normal. There is moderately elevated pulmonary artery systolic  pressure.   3. Left atrial size was mildly dilated.   4. The mitral valve is abnormal. Trivial mitral valve regurgitation. No  evidence  of mitral stenosis. Moderate mitral annular calcification.   5. The aortic valve is tricuspid. There is moderate calcification of the  aortic valve. There is moderate thickening of the aortic valve. Aortic  valve regurgitation is not visualized. Aortic valve sclerosis is present,  with no evidence of aortic valve  stenosis.   6. The inferior vena cava is normal in size with greater than 50%  respiratory variability, suggesting right atrial pressure of 3 mmHg.    04/02/2021  Echo:  1. Left ventricular ejection fraction, by estimation, is 60 to 65%. The  left ventricle has normal function. The left ventricle has no regional  wall motion abnormalities. Left ventricular diastolic parameters were  normal.   2. Right ventricular systolic function is normal. The right ventricular  size is normal. Tricuspid regurgitation signal is inadequate for assessing  PA pressure.   3. The mitral valve is degenerative. Trivial mitral valve regurgitation.  No evidence of mitral stenosis.   4. The aortic valve is tricuspid. There is mild calcification of the  aortic valve. Aortic valve regurgitation is not visualized. Mild aortic  valve sclerosis is present, with no evidence of aortic valve stenosis.   5. The inferior vena cava is normal in size with greater than 50%  respiratory variability, suggesting right atrial pressure of 3 mmHg.   Comparison(s): No significant change from prior study.    03/12/2021  Lexiscan  Myoview : The left ventricular ejection fraction is hyperdynamic (>65%). Nuclear stress EF: 72%. There was no  ST segment deviation noted during stress. No T wave inversion was noted during stress. There is a small apical septal defect which is likely artifact related RV insertion site. Normal wall motion in this region. The study is normal. This is a low risk study.    02/12/2012: EPS/ablation CONCLUSIONS: 1. Sinus rhythm upon presentation.   2. Rotational Angiography reveals a moderate  sized left atrium with four separate pulmonary veins without evidence of pulmonary vein stenosis. 3. Successful electrical isolation and anatomical encircling of all four pulmonary veins with radiofrequency current.   Additional ablation was performed at the junction of the right atrium and SVC. 4. No inducible arrhythmias following ablation both on and off of Isuprel  5. No early apparent complications.     Risk Assessment/Calculations:    Physical Exam:   VS:  There were no vitals taken for this visit.   Wt Readings from Last 3 Encounters:  05/20/24 196 lb (88.9 kg)  10/15/23 192 lb 12.8 oz (87.5 kg)  05/11/23 192 lb 3.2 oz (87.2 kg)    GEN: Well nourished, well developed in no acute distress NECK: No JVD; No carotid bruits CARDIAC: RRR, no murmurs, rubs, gallops RESPIRATORY:  CTA b/l without rales, wheezing or rhonchi  ABDOMEN: Soft, non-tender, non-distended EXTREMITIES: No edema; No deformity   ASSESSMENT AND PLAN: .    paroxysmal AFib CHA2DS2Vasc is 6, on Eliquis, appropriately dosed low burden by symptoms  HTN No change today Deferred to her PMD  Secondary hypercoagulable state 2/2 AFib   Dispo: she would like to be seen Q 30mo, we can see her sooner if needed  Signed, Charlies Macario Arthur, PA-C

## 2024-06-14 ENCOUNTER — Ambulatory Visit: Attending: Physician Assistant | Admitting: Physician Assistant

## 2024-06-14 ENCOUNTER — Encounter: Payer: Self-pay | Admitting: Physician Assistant

## 2024-06-14 VITALS — BP 148/70 | HR 70 | Ht 62.0 in | Wt 195.5 lb

## 2024-06-14 DIAGNOSIS — I480 Paroxysmal atrial fibrillation: Secondary | ICD-10-CM

## 2024-06-14 DIAGNOSIS — I10 Essential (primary) hypertension: Secondary | ICD-10-CM | POA: Diagnosis not present

## 2024-06-14 DIAGNOSIS — D6869 Other thrombophilia: Secondary | ICD-10-CM | POA: Diagnosis not present

## 2024-06-14 NOTE — Patient Instructions (Signed)
 Medication Instructions:    Your physician recommends that you continue on your current medications as directed. Please refer to the Current Medication list given to you today.   *If you need a refill on your cardiac medications before your next appointment, please call your pharmacy*   Lab Work: NONE ORDERED  TODAY    If you have labs (blood work) drawn today and your tests are completely normal, you will receive your results only by: MyChart Message (if you have MyChart) OR A paper copy in the mail If you have any lab test that is abnormal or we need to change your treatment, we will call you to review the results.    Testing/Procedures: NONE ORDERED  TODAY     Follow-Up: At [facility], you and your health needs are our priority.  As part of our continuing mission to provide you with exceptional heart care, our providers are all part of one team.  This team includes your primary Cardiologist (physician) and Advanced Practice Providers or APPs (Physician Assistants and Nurse Practitioners) who all work together to provide you with the care you need, when you need it.  Your next appointment:    6 month(s)   Provider:    You may see Boyce Byes, MD or one of the following Advanced Practice Providers on your designated Care Team:   Mertha Abrahams, [place]      We recommend signing up for the patient portal called "MyChart".  Sign up information is provided on this After Visit Summary.  MyChart is used to connect with patients for Virtual Visits (Telemedicine).  Patients are able to view lab/test results, encounter notes, upcoming appointments, etc.  Non-urgent messages can be sent to your provider as well.   To learn more about what you can do with MyChart, go to [email or web address].   Other Instructions

## 2024-06-29 DIAGNOSIS — R600 Localized edema: Secondary | ICD-10-CM | POA: Diagnosis not present

## 2024-06-29 DIAGNOSIS — Z6836 Body mass index (BMI) 36.0-36.9, adult: Secondary | ICD-10-CM | POA: Diagnosis not present

## 2024-06-29 DIAGNOSIS — R0789 Other chest pain: Secondary | ICD-10-CM | POA: Diagnosis not present

## 2024-06-29 DIAGNOSIS — M79622 Pain in left upper arm: Secondary | ICD-10-CM | POA: Diagnosis not present

## 2024-06-30 DIAGNOSIS — C3491 Malignant neoplasm of unspecified part of right bronchus or lung: Secondary | ICD-10-CM | POA: Diagnosis not present

## 2024-06-30 DIAGNOSIS — I10 Essential (primary) hypertension: Secondary | ICD-10-CM | POA: Diagnosis not present

## 2024-06-30 DIAGNOSIS — I480 Paroxysmal atrial fibrillation: Secondary | ICD-10-CM | POA: Diagnosis not present

## 2024-07-06 DIAGNOSIS — M47816 Spondylosis without myelopathy or radiculopathy, lumbar region: Secondary | ICD-10-CM | POA: Diagnosis not present

## 2024-07-18 DIAGNOSIS — L03115 Cellulitis of right lower limb: Secondary | ICD-10-CM | POA: Diagnosis not present

## 2024-07-18 DIAGNOSIS — R609 Edema, unspecified: Secondary | ICD-10-CM | POA: Diagnosis not present

## 2024-07-26 DIAGNOSIS — H52223 Regular astigmatism, bilateral: Secondary | ICD-10-CM | POA: Diagnosis not present

## 2024-07-26 DIAGNOSIS — Z961 Presence of intraocular lens: Secondary | ICD-10-CM | POA: Diagnosis not present

## 2024-07-26 DIAGNOSIS — H43813 Vitreous degeneration, bilateral: Secondary | ICD-10-CM | POA: Diagnosis not present

## 2024-07-26 DIAGNOSIS — H524 Presbyopia: Secondary | ICD-10-CM | POA: Diagnosis not present

## 2024-07-27 ENCOUNTER — Emergency Department (HOSPITAL_COMMUNITY)
Admission: EM | Admit: 2024-07-27 | Discharge: 2024-07-28 | Disposition: A | Discharge status: Home / Self Care | Attending: Emergency Medicine | Admitting: Emergency Medicine

## 2024-07-27 ENCOUNTER — Other Ambulatory Visit: Payer: Self-pay

## 2024-07-27 DIAGNOSIS — I700 Atherosclerosis of aorta: Secondary | ICD-10-CM | POA: Diagnosis not present

## 2024-07-27 DIAGNOSIS — M7989 Other specified soft tissue disorders: Secondary | ICD-10-CM | POA: Insufficient documentation

## 2024-07-27 DIAGNOSIS — Z7901 Long term (current) use of anticoagulants: Secondary | ICD-10-CM | POA: Diagnosis not present

## 2024-07-27 DIAGNOSIS — Z79899 Other long term (current) drug therapy: Secondary | ICD-10-CM | POA: Insufficient documentation

## 2024-07-27 DIAGNOSIS — D649 Anemia, unspecified: Secondary | ICD-10-CM | POA: Insufficient documentation

## 2024-07-27 DIAGNOSIS — M79604 Pain in right leg: Secondary | ICD-10-CM | POA: Diagnosis not present

## 2024-07-27 DIAGNOSIS — Z86711 Personal history of pulmonary embolism: Secondary | ICD-10-CM | POA: Diagnosis not present

## 2024-07-27 DIAGNOSIS — R600 Localized edema: Secondary | ICD-10-CM | POA: Diagnosis not present

## 2024-07-27 DIAGNOSIS — M79605 Pain in left leg: Secondary | ICD-10-CM | POA: Diagnosis not present

## 2024-07-27 DIAGNOSIS — M79661 Pain in right lower leg: Secondary | ICD-10-CM | POA: Diagnosis not present

## 2024-07-27 DIAGNOSIS — I10 Essential (primary) hypertension: Secondary | ICD-10-CM | POA: Diagnosis not present

## 2024-07-27 LAB — COMPREHENSIVE METABOLIC PANEL WITH GFR
ALT: 19 U/L (ref 0–44)
AST: 24 U/L (ref 15–41)
Albumin: 3.7 g/dL (ref 3.5–5.0)
Alkaline Phosphatase: 89 U/L (ref 38–126)
Anion gap: 12 (ref 5–15)
BUN: 26 mg/dL — ABNORMAL HIGH (ref 8–23)
CO2: 26 mmol/L (ref 22–32)
Calcium: 9.5 mg/dL (ref 8.9–10.3)
Chloride: 98 mmol/L (ref 98–111)
Creatinine, Ser: 1.07 mg/dL — ABNORMAL HIGH (ref 0.44–1.00)
GFR, Estimated: 52 mL/min — ABNORMAL LOW (ref >60)
Glucose, Bld: 167 mg/dL — ABNORMAL HIGH (ref 70–99)
Potassium: 4.3 mmol/L (ref 3.5–5.1)
Sodium: 136 mmol/L (ref 135–145)
Total Bilirubin: 0.4 mg/dL (ref 0.0–1.2)
Total Protein: 6.6 g/dL (ref 6.5–8.1)

## 2024-07-27 LAB — CBC WITH DIFFERENTIAL/PLATELET
Abs Immature Granulocytes: 0.03 K/uL (ref 0.00–0.07)
Basophils Absolute: 0 K/uL (ref 0.0–0.1)
Basophils Relative: 0 %
Eosinophils Absolute: 0.1 K/uL (ref 0.0–0.5)
Eosinophils Relative: 1 %
HCT: 37.5 % (ref 36.0–46.0)
Hemoglobin: 11.7 g/dL — ABNORMAL LOW (ref 12.0–15.0)
Immature Granulocytes: 0 %
Lymphocytes Relative: 21 %
Lymphs Abs: 1.7 K/uL (ref 0.7–4.0)
MCH: 29 pg (ref 26.0–34.0)
MCHC: 31.2 g/dL (ref 30.0–36.0)
MCV: 93.1 fL (ref 80.0–100.0)
Monocytes Absolute: 0.6 K/uL (ref 0.1–1.0)
Monocytes Relative: 7 %
Neutro Abs: 5.9 K/uL (ref 1.7–7.7)
Neutrophils Relative %: 71 %
Platelets: 289 K/uL (ref 150–400)
RBC: 4.03 MIL/uL (ref 3.87–5.11)
RDW: 12.8 % (ref 11.5–15.5)
WBC: 8.3 K/uL (ref 4.0–10.5)
nRBC: 0 % (ref 0.0–0.2)

## 2024-07-27 LAB — PROTIME-INR
INR: 1.2 (ref 0.8–1.2)
Prothrombin Time: 16 s — ABNORMAL HIGH (ref 11.4–15.2)

## 2024-07-27 NOTE — ED Triage Notes (Signed)
 Patient sent by PCP because patient was on xarelto  and still developed PEs so switched to eliquis but has increased leg swelling and pain to right leg

## 2024-07-27 NOTE — ED Provider Triage Note (Signed)
 Emergency Medicine Provider Triage Evaluation Note  Julie Gill , a 83 y.o. female  was evaluated in triage.  Pt complains of bilateral lower extremity pain.  Sent over from primary care provider as she has failed conservative therapy for her right leg swelling.  Has tried elevation, reducing salt intake, even antibiotics for cellulitis without relief.  Patient has a history of pulmonary embolism.  Patient was initially placed on Xarelto  due to atrial fibrillation but still developed a pulmonary embolism so she is now on Eliquis.  PCP sent patient over for concern of DVT even though patient has been compliant with Eliquis therapy and states she has not missed any doses.  Patient denies chest pain or shortness of breath.  Review of Systems  Positive: Bilateral leg pain, right leg swelling Negative: Fever, chills, chest pain, shortness of breath, abdominal pain  Physical Exam  BP (!) 164/64 (BP Location: Right Arm)   Pulse 79   Temp 98.2 F (36.8 C)   Resp 12   Ht 5' 2 (1.575 m)   Wt 88.7 kg   SpO2 100%   BMI 35.77 kg/m  Gen:   Awake, no distress   Resp:  Normal effort  MSK:   Moves extremities without difficulty  Other:  Ambulatory without assistance, both right and left calves tender to palpation, right lower extremity appears more swollen when compared to left  Medical Decision Making  Medically screening exam initiated at 7:56 PM.  Appropriate orders placed.  SHAVAWN STOBAUGH was informed that the remainder of the evaluation will be completed by another provider, this initial triage assessment does not replace that evaluation, and the importance of remaining in the ED until their evaluation is complete.  Orders: CBC, CMP, PT/INR, DVT ultrasound of bilateral lower extremities   Janetta Terrall FALCON, PA-C 07/27/24 1958

## 2024-07-28 ENCOUNTER — Emergency Department (HOSPITAL_COMMUNITY)

## 2024-07-28 ENCOUNTER — Emergency Department (EMERGENCY_DEPARTMENT_HOSPITAL)

## 2024-07-28 ENCOUNTER — Encounter (HOSPITAL_COMMUNITY): Payer: Self-pay

## 2024-07-28 DIAGNOSIS — M79661 Pain in right lower leg: Secondary | ICD-10-CM | POA: Diagnosis not present

## 2024-07-28 DIAGNOSIS — I700 Atherosclerosis of aorta: Secondary | ICD-10-CM | POA: Diagnosis not present

## 2024-07-28 DIAGNOSIS — M7989 Other specified soft tissue disorders: Secondary | ICD-10-CM | POA: Diagnosis not present

## 2024-07-28 LAB — BRAIN NATRIURETIC PEPTIDE: B Natriuretic Peptide: 47.9 pg/mL (ref 0.0–100.0)

## 2024-07-28 MED ORDER — OXYCODONE-ACETAMINOPHEN 5-325 MG PO TABS
1.0000 | ORAL_TABLET | Freq: Once | ORAL | Status: AC
  Administered 2024-07-28: 1 via ORAL
  Filled 2024-07-28: qty 1

## 2024-07-28 MED ORDER — FUROSEMIDE 20 MG PO TABS: 20.0000 mg | ORAL_TABLET | Freq: Every day | ORAL | 0 refills | Status: AC

## 2024-07-28 NOTE — Discharge Instructions (Signed)
 You were seen for your leg swelling in the emergency department.   At home, please wear compression socks and take the Lasix  that we have given you for the next 5 days.    Check your MyChart online for the results of any tests that had not resulted by the time you left the emergency department.   Follow-up with your primary doctor in 2-3 days regarding your visit.    Return immediately to the emergency department if you experience any of the following: Chest pain, shortness of breath, or any other concerning symptoms.    Thank you for visiting our Emergency Department. It was a pleasure taking care of you today.

## 2024-07-28 NOTE — Progress Notes (Signed)
 BLE venous duplex has been completed.  Preliminary results given to Dr. Yolande.   Results can be found under chart review under CV PROC. 07/28/2024 10:28 AM Rajveer Handler RVT, RDMS

## 2024-07-28 NOTE — ED Notes (Signed)
 Patient transported to X-ray

## 2024-07-28 NOTE — ED Provider Notes (Signed)
  Physical Exam  BP (!) 165/61   Pulse 68   Temp 97.8 F (36.6 C)   Resp 16   Ht 5' 2 (1.575 m)   Wt 88.7 kg   SpO2 100%   BMI 35.77 kg/m   Physical Exam  Procedures  Procedures  ED Course / MDM   Clinical Course as of 07/28/24 1706  Thu Jul 28, 2024  0711 Assumed care from Dr Raford. 83 yo F hx of AF on eliquis with lower extremity edema who is awaiting bilateral lower extremity edema. No chest pain or SOB. Saw her OP Dr who referred her to the ED for DVT study. Appears more cw CHF than DVT but has had a breakthrough PE when she was on eliquis. If negative DVT study today can go home with lasix  daily. If she does have a DVT would likely need lovenox  and then coumadin .  [RP]  [phone] Patient reassessed.  Has trace to 1+ edema bilaterally.  No chest pain or shortness of breath.  Her ultrasounds come back and does not show evidence of DVT.  The remainder of her workup has been overall reassuring.  Discussed options with her to treat her lower extremity edema and she would like to go on a short course of Lasix .  Will have her follow-up with her primary doctor about her leg swelling. [RP]    Clinical Course User Index [RP] Yolande Lamar BROCKS, MD   Medical Decision Making Amount and/or Complexity of Data Reviewed Labs: ordered. Radiology: ordered.  Risk Prescription drug management.      Yolande Lamar BROCKS, MD 07/28/24 [phone]

## 2024-07-28 NOTE — ED Provider Notes (Signed)
 [place] EMERGENCY DEPARTMENT AT [facility] Provider Note   CSN: [id number] Arrival date & time: 07/27/24  8176     Patient presents with: Leg Swelling   Julie Gill is a 83 y.o. female.   The history is provided by the patient.   She has history of hypertension, hyperlipidemia, atrial fibrillation, pulmonary embolism anticoagulated on apixaban and comes in because of leg swelling.  She noted bilateral leg swelling starting about 2-1/2 weeks ago, right greater than left.  She is complaining of pain in her lower legs.  She denies any chest pain or shortness of breath.  She did see her primary care provider who thought that she had ingested too much salt and recommended salt restriction.  She was also put on an antibiotic to treat possible cellulitis.  In spite of that, leg swelling has been getting worse.  She went to urgent care today and was advised to come in to rule out DVT.  Of note, she had been anticoagulated on rivaroxaban  at 1 point and developed pulmonary embolism and was switched to Lovenox  and then back to apixaban.  So there is cause for concern for breakthrough DVT.    Prior to Admission medications   Medication Sig Start Date End Date Taking? Authorizing Provider  acetaminophen  (TYLENOL ) 500 MG tablet Take 1,000 mg by mouth daily as needed (pain).    [provider]  calcium  carbonate (OS-CAL) 600 MG TABS Take 600 mg by mouth daily.    [provider]  Cholecalciferol  (VITAMIN D PO) Take 2,000 Units by mouth daily.    [provider]  clonazePAM  (KLONOPIN ) 0.5 MG tablet Take 0.25 mg by mouth daily as needed for anxiety.    [provider]  ELIQUIS 5 MG TABS tablet Take 5 mg by mouth 2 (two) times daily.    [provider]  famotidine  (PEPCID ) 20 MG tablet Take 20 mg by mouth at bedtime.    [provider]  lisinopril -hydrochlorothiazide  (ZESTORETIC ) 20-25 MG tablet Take 1 tablet by mouth daily. 06/17/21    [provider]  lovastatin (MEVACOR) 40 MG tablet Take 80 mg by mouth at bedtime. Patient taking differently: Take 40 mg by mouth at bedtime. 09/11/12   [provider]  MAGNESIUM  PO Take 250 mg by mouth daily.    [provider]  nitroGLYCERIN  (NITROSTAT ) 0.4 MG SL tablet Place 1 tablet (0.4 mg total) under the tongue every 5 (five) minutes as needed for chest pain. 08/13/20   Allred, Lynwood, MD  polyethylene glycol (MIRALAX  / GLYCOLAX ) 17 g packet Take 17 g by mouth daily as needed for moderate constipation.    [provider]  potassium chloride  SA (K-DUR,KLOR-CON ) 20 MEQ tablet Take 20 mEq by mouth daily.    [provider]  senna (SENOKOT) 8.6 MG tablet Take 1 tablet by mouth daily.    [provider]  vitamin E  180 MG (400 UNITS) capsule Take 400 Units by mouth daily.     [provider]    Allergies: Codeine and Prednisone    Review of Systems  All other systems reviewed and are negative.   Updated Vital Signs BP (!) 154/49 (BP Location: Left Arm)   Pulse 69   Temp 97.7 F (36.5 C)   Resp 16   Ht 5' 2 (1.575 m)   Wt 88.7 kg   SpO2 100%   BMI 35.77 kg/m   Physical Exam Vitals and nursing note reviewed.   83  year old female, resting comfortably and in no acute distress. Vital signs are significant for mildly elevated blood pressure. Oxygen saturation is 100%, which is normal. Head is normocephalic and atraumatic. PERRLA, EOMI.  Neck is nontender and supple without adenopathy or JVD. Back is nontender.  There is no presacral edema. Lungs are clear without rales, wheezes, or rhonchi. Chest is nontender. Heart has regular rate and rhythm without murmur. Abdomen is soft, flat, nontender. Extremities have 1-2+ edema, full range of motion is present.  Right calf circumference is 1 cm greater than left calf circumference.  There is mild tenderness diffusely throughout both lower legs without any palpable cords and no  erythema or warmth.  There is no tenderness anywhere in the thigh or inguinal area. Skin is warm and dry without rash. Neurologic: Mental status is normal, moves all extremities equally.  (all labs ordered are listed, but only abnormal results are displayed) Labs Reviewed  CBC WITH DIFFERENTIAL/PLATELET - Abnormal; Notable for the following components:      Result Value   Hemoglobin 11.7 (*)    All other components within normal limits  COMPREHENSIVE METABOLIC PANEL WITH GFR - Abnormal; Notable for the following components:   Glucose, Bld 167 (*)    BUN 26 (*)    Creatinine, Ser 1.07 (*)    GFR, Estimated 52 (*)    All other components within normal limits  PROTIME-INR - Abnormal; Notable for the following components:   Prothrombin Time 16.0 (*)    All other components within normal limits  BRAIN NATRIURETIC PEPTIDE    Radiology: DG Chest 2 View Result Date: 07/28/2024 CLINICAL DATA:  Leg swelling EXAM: CHEST - 2 VIEW COMPARISON:  05/20/2024 FINDINGS: Cardiac shadow is within normal limits. Aortic calcifications are seen. Lungs are well aerated bilaterally. No focal infiltrate or sizable effusion is seen. No bony abnormality is noted. IMPRESSION: No active cardiopulmonary disease. Electronically Signed   By: Oneil Devonshire M.D.   On: 07/28/2024 02:58     Procedures   Medications Ordered in the ED - No data to display  Clinical Course as of 07/28/24 0720  Thu Jul 28, 2024  0711 Assumed care from Dr Raford. 83 yo F hx of AF on eliquis with lower extremity edema who is awaiting bilateral lower extremity edema. No chest pain or SOB. Saw her OP Dr who referred her to the ED for DVT study. Appears more cw CHF than DVT but has had a breakthrough PE when she was on eliquis. If negative DVT study today can go home with lasix  daily. If she does have a DVT would likely need lovenox  and then coumadin .  [RP]    Clinical Course User Index [RP] Yolande Lamar BROCKS, MD                                  Medical Decision Making Amount and/or Complexity of Data Reviewed Labs: ordered. Radiology: ordered.  Risk Prescription drug management.   Bilateral leg edema which is most likely heart failure.  Exam not consistent with cellulitis.  However, with history of breakthrough clots on anticoagulants, clearly need to rule out DVT.  I have reviewed her laboratory tests, and my interpretation is stable mild renal insufficiency, elevated random glucose level, stable anemia.  I have ordered a BNP and chest x-ray.  I have reviewed her past records confirming history of pulmonary embolism despite anticoagulation with rivaroxaban .  Also, she  has had multiple echocardiograms showing normal ejection fraction and normal left ventricular diastolic parameters-most recently on 02/21/2023.  Chest x-ray shows no acute cardiopulmonary process.  I have independently viewed the images, and agree with radiologist's interpretation.  Specifically, no evidence of cardiomegaly.  BNP is normal, but this does not rule out diastolic heart failure.  Vascular ultrasound is pending.  Plan will be to discharge on diuretics if ultrasound shows no evidence of DVT.  If ultrasound does show evidence of DVT, she will need to be enoxaparin  until transitioned to warfarin.  Case is signed out to Dr. Yolande, oncoming physician.     Final diagnoses:  Leg edema  Normochromic normocytic anemia  Chronic anticoagulation    ED Discharge Orders     None          Raford Lenis, MD 07/28/24 [phone]

## 2024-07-31 DIAGNOSIS — I480 Paroxysmal atrial fibrillation: Secondary | ICD-10-CM | POA: Diagnosis not present

## 2024-07-31 DIAGNOSIS — I10 Essential (primary) hypertension: Secondary | ICD-10-CM | POA: Diagnosis not present

## 2024-07-31 DIAGNOSIS — C3491 Malignant neoplasm of unspecified part of right bronchus or lung: Secondary | ICD-10-CM | POA: Diagnosis not present

## 2024-08-03 DIAGNOSIS — G2581 Restless legs syndrome: Secondary | ICD-10-CM | POA: Diagnosis not present

## 2024-08-03 DIAGNOSIS — M48061 Spinal stenosis, lumbar region without neurogenic claudication: Secondary | ICD-10-CM | POA: Diagnosis not present

## 2024-08-03 DIAGNOSIS — G629 Polyneuropathy, unspecified: Secondary | ICD-10-CM | POA: Diagnosis not present

## 2024-08-03 DIAGNOSIS — R600 Localized edema: Secondary | ICD-10-CM | POA: Diagnosis not present

## 2024-08-03 DIAGNOSIS — Z6835 Body mass index (BMI) 35.0-35.9, adult: Secondary | ICD-10-CM | POA: Diagnosis not present

## 2024-08-04 ENCOUNTER — Other Ambulatory Visit: Payer: Self-pay | Admitting: Medical Oncology

## 2024-08-24 DIAGNOSIS — I480 Paroxysmal atrial fibrillation: Secondary | ICD-10-CM | POA: Diagnosis not present

## 2024-08-24 DIAGNOSIS — G2581 Restless legs syndrome: Secondary | ICD-10-CM | POA: Diagnosis not present

## 2024-08-24 DIAGNOSIS — D638 Anemia in other chronic diseases classified elsewhere: Secondary | ICD-10-CM | POA: Diagnosis not present

## 2024-08-24 DIAGNOSIS — M48061 Spinal stenosis, lumbar region without neurogenic claudication: Secondary | ICD-10-CM | POA: Diagnosis not present

## 2024-08-24 DIAGNOSIS — Z6835 Body mass index (BMI) 35.0-35.9, adult: Secondary | ICD-10-CM | POA: Diagnosis not present

## 2024-08-24 DIAGNOSIS — Z79899 Other long term (current) drug therapy: Secondary | ICD-10-CM | POA: Diagnosis not present

## 2024-08-24 DIAGNOSIS — F419 Anxiety disorder, unspecified: Secondary | ICD-10-CM | POA: Diagnosis not present

## 2024-08-24 DIAGNOSIS — R7303 Prediabetes: Secondary | ICD-10-CM | POA: Diagnosis not present

## 2024-08-24 DIAGNOSIS — D509 Iron deficiency anemia, unspecified: Secondary | ICD-10-CM | POA: Diagnosis not present

## 2024-08-24 DIAGNOSIS — R600 Localized edema: Secondary | ICD-10-CM | POA: Diagnosis not present

## 2024-08-24 DIAGNOSIS — Z23 Encounter for immunization: Secondary | ICD-10-CM | POA: Diagnosis not present

## 2024-08-24 DIAGNOSIS — G629 Polyneuropathy, unspecified: Secondary | ICD-10-CM | POA: Diagnosis not present

## 2024-08-24 DIAGNOSIS — G479 Sleep disorder, unspecified: Secondary | ICD-10-CM | POA: Diagnosis not present

## 2024-08-30 DIAGNOSIS — C3491 Malignant neoplasm of unspecified part of right bronchus or lung: Secondary | ICD-10-CM | POA: Diagnosis not present

## 2024-08-30 DIAGNOSIS — I480 Paroxysmal atrial fibrillation: Secondary | ICD-10-CM | POA: Diagnosis not present

## 2024-08-30 DIAGNOSIS — I10 Essential (primary) hypertension: Secondary | ICD-10-CM | POA: Diagnosis not present

## 2024-08-31 DIAGNOSIS — Z23 Encounter for immunization: Secondary | ICD-10-CM | POA: Diagnosis not present

## 2024-08-31 DIAGNOSIS — Z0000 Encounter for general adult medical examination without abnormal findings: Secondary | ICD-10-CM | POA: Diagnosis not present

## 2024-09-04 ENCOUNTER — Other Ambulatory Visit (HOSPITAL_BASED_OUTPATIENT_CLINIC_OR_DEPARTMENT_OTHER): Payer: Self-pay | Admitting: Family Medicine

## 2024-09-04 DIAGNOSIS — Z780 Asymptomatic menopausal state: Secondary | ICD-10-CM

## 2024-09-05 DIAGNOSIS — M79671 Pain in right foot: Secondary | ICD-10-CM | POA: Diagnosis not present

## 2024-09-05 DIAGNOSIS — G2581 Restless legs syndrome: Secondary | ICD-10-CM | POA: Diagnosis not present

## 2024-09-05 DIAGNOSIS — R600 Localized edema: Secondary | ICD-10-CM | POA: Diagnosis not present

## 2024-09-05 DIAGNOSIS — M79672 Pain in left foot: Secondary | ICD-10-CM | POA: Diagnosis not present

## 2024-09-05 DIAGNOSIS — Z6835 Body mass index (BMI) 35.0-35.9, adult: Secondary | ICD-10-CM | POA: Diagnosis not present

## 2024-09-05 DIAGNOSIS — D508 Other iron deficiency anemias: Secondary | ICD-10-CM | POA: Diagnosis not present

## 2024-09-06 DIAGNOSIS — D485 Neoplasm of uncertain behavior of skin: Secondary | ICD-10-CM | POA: Diagnosis not present

## 2024-09-06 DIAGNOSIS — C44311 Basal cell carcinoma of skin of nose: Secondary | ICD-10-CM | POA: Diagnosis not present

## 2024-09-09 DIAGNOSIS — R7689 Other specified abnormal immunological findings in serum: Secondary | ICD-10-CM | POA: Diagnosis not present

## 2024-09-13 DIAGNOSIS — R7689 Other specified abnormal immunological findings in serum: Secondary | ICD-10-CM | POA: Diagnosis not present

## 2024-09-16 ENCOUNTER — Emergency Department (HOSPITAL_BASED_OUTPATIENT_CLINIC_OR_DEPARTMENT_OTHER)
Admission: EM | Admit: 2024-09-16 | Discharge: 2024-09-17 | Disposition: A | Discharge status: Home / Self Care | Attending: Emergency Medicine | Admitting: Emergency Medicine

## 2024-09-16 ENCOUNTER — Encounter (HOSPITAL_BASED_OUTPATIENT_CLINIC_OR_DEPARTMENT_OTHER): Payer: Self-pay

## 2024-09-16 ENCOUNTER — Emergency Department (HOSPITAL_BASED_OUTPATIENT_CLINIC_OR_DEPARTMENT_OTHER)

## 2024-09-16 DIAGNOSIS — M7989 Other specified soft tissue disorders: Secondary | ICD-10-CM | POA: Diagnosis not present

## 2024-09-16 DIAGNOSIS — R600 Localized edema: Secondary | ICD-10-CM | POA: Diagnosis not present

## 2024-09-16 DIAGNOSIS — Z7901 Long term (current) use of anticoagulants: Secondary | ICD-10-CM | POA: Diagnosis not present

## 2024-09-16 DIAGNOSIS — M79662 Pain in left lower leg: Secondary | ICD-10-CM | POA: Diagnosis not present

## 2024-09-16 DIAGNOSIS — R2240 Localized swelling, mass and lump, unspecified lower limb: Secondary | ICD-10-CM | POA: Diagnosis not present

## 2024-09-16 NOTE — ED Triage Notes (Signed)
 Pt c/o fall Monday morning, got a boo-boo on L shin (describes avulsion), now has increased/ new-onset bruising & increased pain today.

## 2024-09-17 ENCOUNTER — Other Ambulatory Visit (HOSPITAL_BASED_OUTPATIENT_CLINIC_OR_DEPARTMENT_OTHER): Payer: Self-pay | Admitting: Emergency Medicine

## 2024-09-17 ENCOUNTER — Emergency Department (HOSPITAL_BASED_OUTPATIENT_CLINIC_OR_DEPARTMENT_OTHER)
Admit: 2024-09-17 | Discharge: 2024-09-17 | Disposition: A | Discharge status: Home / Self Care | Attending: Emergency Medicine | Admitting: Emergency Medicine

## 2024-09-17 DIAGNOSIS — M7989 Other specified soft tissue disorders: Secondary | ICD-10-CM | POA: Diagnosis not present

## 2024-09-17 DIAGNOSIS — M79605 Pain in left leg: Secondary | ICD-10-CM | POA: Diagnosis not present

## 2024-09-17 DIAGNOSIS — M79604 Pain in right leg: Secondary | ICD-10-CM | POA: Diagnosis not present

## 2024-09-17 NOTE — ED Provider Notes (Signed)
 [place] EMERGENCY DEPARTMENT AT [facility] Provider Note   CSN: [id number] Arrival date & time: 09/16/24  2106     Patient presents with: Leg Pain (L)   Julie Gill is a 83 y.o. female.   The patient is presenting with concerns regarding swelling and discoloration of the right leg. The patient reports that the symptoms began after an incident involving the footboard of a bed on Monday, which resulted in a cut on the front of the thigh. The patient had an appointment scheduled for an ultrasound of the veins due to bilateral leg swelling, but it was postponed as pressure on the leg was contraindicated due to the cut. The patient notes that the right leg has become more swollen and has developed a gray discoloration, prompting the visit. The swelling has been present throughout the week, with some relief from ice and heat, but the discoloration began today. The patient describes the leg as sore to touch and has a history of blood clots, treated with Eliquis after Xarelto  had failed. The patient denies any significant warmth or redness suggestive of infection but expresses concern about the possibility of a blood clot. The history was obtained from the patient.   Leg Pain      Prior to Admission medications   Medication Sig Start Date End Date Taking? Authorizing Provider  acetaminophen  (TYLENOL ) 500 MG tablet Take 1,000 mg by mouth daily as needed (pain).    [provider]  calcium  carbonate (OS-CAL) 600 MG TABS Take 600 mg by mouth daily.    [provider]  cephALEXin (KEFLEX) 500 MG capsule Take 500 mg by mouth 3 (three) times daily. 07/18/24   [provider]  Cholecalciferol  (VITAMIN D PO) Take 2,000 Units by mouth daily.    [provider]  clonazePAM  (KLONOPIN ) 0.5 MG tablet Take 0.25 mg by mouth daily as needed for anxiety.    [provider]  ELIQUIS 5 MG TABS tablet Take 5 mg by mouth 2 (two) times daily.    [provider]  famotidine  (PEPCID ) 20 MG tablet Take 20 mg by mouth at bedtime.    [provider]  furosemide  (LASIX ) 20 MG tablet Take 1 tablet (20 mg total) by mouth daily for 5 days. 07/28/24 08/02/24  Yolande Lamar BROCKS, MD  lisinopril -hydrochlorothiazide  (ZESTORETIC ) 20-25 MG tablet Take 1 tablet by mouth daily. 06/17/21   [provider]  lovastatin (MEVACOR) 40 MG tablet Take 80 mg by mouth at bedtime. Patient taking differently: Take 40 mg by mouth at bedtime. 09/11/12   [provider]  MAGNESIUM  PO Take 250 mg by mouth daily.    [provider]  nitroGLYCERIN  (NITROSTAT ) 0.4 MG SL tablet Place 1 tablet (0.4 mg total) under the tongue every 5 (five) minutes as needed for chest pain. 08/13/20   Allred, Lynwood, MD  polyethylene glycol (MIRALAX  / GLYCOLAX ) 17 g packet Take 17 g by mouth daily as needed for moderate constipation.    [provider]  potassium chloride  SA (K-DUR,KLOR-CON ) 20 MEQ tablet Take 20 mEq by mouth daily.    [provider]  senna (SENOKOT) 8.6 MG tablet Take 1 tablet by mouth daily.    [provider]  vitamin E  180 MG (400 UNITS) capsule Take 400 Units by mouth daily.     [provider]    Allergies: Codeine and Prednisone    Review of Systems  Updated Vital Signs BP (!) 149/54   Pulse 88  Temp 97.8 F (36.6 C)   Resp 16   SpO2 96%   Physical Exam Vitals and nursing note reviewed.  Constitutional:      Appearance: She is well-developed.  HENT:     Head: Normocephalic and atraumatic.  Cardiovascular:     Rate and Rhythm: Normal rate and regular rhythm.  Pulmonary:     Effort: No respiratory distress.     Breath sounds: No stridor.  Abdominal:     General: There is no distension.  Musculoskeletal:     Cervical back: Normal range of motion.  Skin:    General: Skin is warm and dry.     Comments: Has some bluish discoloration of her skin on the left ankle and foot that is also  tender to touch.  She has a avulsion type injury to the anterior proximal tibia with no surrounding erythema, edema or warmth.  No drainage.  Has normal appearing aged ecchymosis around it.  Right leg with signs of vascular insufficiency but not as swollen as the left.  She has strong palpable DP pulses that are symmetric.  No evidence of compartment syndrome.  Neurological:     General: No focal deficit present.     Mental Status: She is alert.     (all labs ordered are listed, but only abnormal results are displayed) Labs Reviewed - No data to display  EKG: None  Radiology: DG Tibia/Fibula Left Result Date: 09/16/2024 CLINICAL DATA:  [place], pain EXAM: LEFT TIBIA AND FIBULA - 2 VIEW COMPARISON:  None Available. FINDINGS: Frontal and lateral views of the left tibia and fibula are obtained. There are no acute displaced fractures. Alignment of the left knee and ankle is anatomic. Diffuse subcutaneous edema. No subcutaneous gas or radiopaque foreign body. IMPRESSION: 1. Diffuse subcutaneous edema. 2. No acute bony abnormality. Electronically Signed   By: Ozell Daring M.D.   On: 09/16/2024 23:10     Procedures   Medications Ordered in the ED - No data to display                                  Medical Decision Making Amount and/or Complexity of Data Reviewed Radiology: ordered.   The patient presented with swelling and discoloration of the leg following a fall earlier in the week. The patient reported that the leg began turning gray and swelling increased today, prompting the visit. The patient has a history of blood clots and is on anticoagulation therapy with Eliquis. Physical examination revealed significant swelling and bruising on the affected leg, but good arterial pulses and color, suggesting adequate arterial circulation. An X-ray was performed, showing no fractures but confirming swelling. The patient was concerned about the possibility of a blood clot. An ultrasound was  considered necessary to rule out deep vein thrombosis (DVT), but ultrasound services were not available at the time of the visit. The patient was advised to return for an ultrasound the following day and to follow up with a vascular specialist despite the results but may need to be admitted if positive while on anticoagulation.  Differential Diagnosis: Differential diagnosis includes but is not limited to: deep vein thrombosis, cellulitis, venous insufficiency, hematoma, and lymphedema.  Final diagnoses:  Leg swelling    ED Discharge Orders          Ordered    US  Venous Img Lower Bilateral        09/17/24 0014  Sharrod Achille, Selinda, MD 09/17/24 [phone]

## 2024-09-24 DIAGNOSIS — M545 Low back pain, unspecified: Secondary | ICD-10-CM | POA: Diagnosis not present

## 2024-09-28 DIAGNOSIS — R600 Localized edema: Secondary | ICD-10-CM | POA: Diagnosis not present

## 2024-09-28 DIAGNOSIS — M79662 Pain in left lower leg: Secondary | ICD-10-CM | POA: Diagnosis not present

## 2024-09-28 DIAGNOSIS — M79661 Pain in right lower leg: Secondary | ICD-10-CM | POA: Diagnosis not present

## 2024-09-28 DIAGNOSIS — I87393 Chronic venous hypertension (idiopathic) with other complications of bilateral lower extremity: Secondary | ICD-10-CM | POA: Diagnosis not present

## 2024-09-28 DIAGNOSIS — I83893 Varicose veins of bilateral lower extremities with other complications: Secondary | ICD-10-CM | POA: Diagnosis not present

## 2024-09-30 DIAGNOSIS — C3491 Malignant neoplasm of unspecified part of right bronchus or lung: Secondary | ICD-10-CM | POA: Diagnosis not present

## 2024-09-30 DIAGNOSIS — I10 Essential (primary) hypertension: Secondary | ICD-10-CM | POA: Diagnosis not present

## 2024-09-30 DIAGNOSIS — I480 Paroxysmal atrial fibrillation: Secondary | ICD-10-CM | POA: Diagnosis not present

## 2024-10-04 DIAGNOSIS — M545 Low back pain, unspecified: Secondary | ICD-10-CM | POA: Diagnosis not present

## 2024-10-05 ENCOUNTER — Ambulatory Visit (HOSPITAL_COMMUNITY)
Admission: RE | Admit: 2024-10-05 | Discharge: 2024-10-05 | Disposition: A | Discharge status: Home / Self Care | Source: Ambulatory Visit | Attending: Internal Medicine | Admitting: Internal Medicine

## 2024-10-05 ENCOUNTER — Inpatient Hospital Stay: Attending: Internal Medicine

## 2024-10-05 DIAGNOSIS — M7989 Other specified soft tissue disorders: Secondary | ICD-10-CM | POA: Insufficient documentation

## 2024-10-05 DIAGNOSIS — R918 Other nonspecific abnormal finding of lung field: Secondary | ICD-10-CM | POA: Diagnosis not present

## 2024-10-05 DIAGNOSIS — K449 Diaphragmatic hernia without obstruction or gangrene: Secondary | ICD-10-CM | POA: Diagnosis not present

## 2024-10-05 DIAGNOSIS — C3411 Malignant neoplasm of upper lobe, right bronchus or lung: Secondary | ICD-10-CM | POA: Insufficient documentation

## 2024-10-05 DIAGNOSIS — I700 Atherosclerosis of aorta: Secondary | ICD-10-CM | POA: Diagnosis not present

## 2024-10-05 DIAGNOSIS — C3490 Malignant neoplasm of unspecified part of unspecified bronchus or lung: Secondary | ICD-10-CM | POA: Insufficient documentation

## 2024-10-05 LAB — CMP (CANCER CENTER ONLY)
ALT: 10 U/L (ref 0–44)
AST: 13 U/L — ABNORMAL LOW (ref 15–41)
Albumin: 3.7 g/dL (ref 3.5–5.0)
Alkaline Phosphatase: 86 U/L (ref 38–126)
Anion gap: 4 — ABNORMAL LOW (ref 5–15)
BUN: 22 mg/dL (ref 8–23)
CO2: 31 mmol/L (ref 22–32)
Calcium: 9.3 mg/dL (ref 8.9–10.3)
Chloride: 106 mmol/L (ref 98–111)
Creatinine: 1.06 mg/dL — ABNORMAL HIGH (ref 0.44–1.00)
GFR, Estimated: 53 mL/min — ABNORMAL LOW (ref >60)
Glucose, Bld: 119 mg/dL — ABNORMAL HIGH (ref 70–99)
Potassium: 5.1 mmol/L (ref 3.5–5.1)
Sodium: 141 mmol/L (ref 135–145)
Total Bilirubin: 0.2 mg/dL (ref 0.0–1.2)
Total Protein: 6.2 g/dL — ABNORMAL LOW (ref 6.5–8.1)

## 2024-10-05 LAB — CBC WITH DIFFERENTIAL (CANCER CENTER ONLY)
Abs Immature Granulocytes: 0.04 K/uL (ref 0.00–0.07)
Basophils Absolute: 0 K/uL (ref 0.0–0.1)
Basophils Relative: 0 %
Eosinophils Absolute: 0.1 K/uL (ref 0.0–0.5)
Eosinophils Relative: 2 %
HCT: 30.6 % — ABNORMAL LOW (ref 36.0–46.0)
Hemoglobin: 9.6 g/dL — ABNORMAL LOW (ref 12.0–15.0)
Immature Granulocytes: 1 %
Lymphocytes Relative: 25 %
Lymphs Abs: 1.8 K/uL (ref 0.7–4.0)
MCH: 28.6 pg (ref 26.0–34.0)
MCHC: 31.4 g/dL (ref 30.0–36.0)
MCV: 91.1 fL (ref 80.0–100.0)
Monocytes Absolute: 0.5 K/uL (ref 0.1–1.0)
Monocytes Relative: 7 %
Neutro Abs: 4.7 K/uL (ref 1.7–7.7)
Neutrophils Relative %: 65 %
Platelet Count: 291 K/uL (ref 150–400)
RBC: 3.36 MIL/uL — ABNORMAL LOW (ref 3.87–5.11)
RDW: 14.1 % (ref 11.5–15.5)
WBC Count: 7.1 K/uL (ref 4.0–10.5)
nRBC: 0 % (ref 0.0–0.2)

## 2024-10-05 MED ORDER — SODIUM CHLORIDE (PF) 0.9 % IJ SOLN
INTRAMUSCULAR | Status: AC
  Filled 2024-10-05: qty 50

## 2024-10-05 MED ORDER — IOHEXOL 300 MG/ML  SOLN
75.0000 mL | Freq: Once | INTRAMUSCULAR | Status: AC | PRN
Start: 2024-10-05 — End: 2024-10-05
  Administered 2024-10-05: 75 mL via INTRAVENOUS

## 2024-10-10 ENCOUNTER — Inpatient Hospital Stay: Admitting: Internal Medicine

## 2024-10-10 VITALS — HR 78 | Temp 98.0°F | Resp 17 | Ht 62.0 in | Wt 193.0 lb

## 2024-10-10 DIAGNOSIS — C3490 Malignant neoplasm of unspecified part of unspecified bronchus or lung: Secondary | ICD-10-CM | POA: Diagnosis not present

## 2024-10-10 DIAGNOSIS — C3411 Malignant neoplasm of upper lobe, right bronchus or lung: Secondary | ICD-10-CM | POA: Diagnosis not present

## 2024-10-10 NOTE — Progress Notes (Signed)
 [facility] [phone]   [phone]  OFFICE PROGRESS NOTE  Julie Harvey, MD [address] [place] [address]  DIAGNOSIS: Stage IB (T1c, N0, M0) non-small cell lung cancer, adenocarcinoma but the patient also has multifocal groundglass opacities in the lung bilaterally highly concerning for low-grade adenocarcinoma as well diagnosed in November 2017.  Genomic Alteration Identified? KRAS G12V Additional Findings? Microsatellite status MS-Stable Tumor Mutation Burden TMB-Intermediate; 11 Muts/Mb Additional Disease-relevant Genes with No Reportable Alterations Identified? EGFR ALK BRAF MET RET ERBB2 ROS1   PDL1 expression 5%.  PRIOR THERAPY: Status post wedge resection of the right upper lobe on 10/02/2016 under the care of Dr. Kerrin.  CURRENT THERAPY: Observation.  INTERVAL HISTORY: Julie Gill 83 y.o. female returns to the clinic today for follow-up visit accompanied by her husband.Discussed the use of AI scribe software for clinical note transcription with the patient, who gave verbal consent to proceed.  History of Present Illness Julie Gill is an 83 year old female with stage 1 small cell lung cancer who presents for re-evaluation and repeat CT scan for restaging of her disease. She is accompanied by her husband.  Diagnosed with stage 1 small cell lung cancer, adenocarcinoma, and multifocal ground glass opacities bilaterally in November 2017. Underwent a wedge resection of the right upper lobe and has been under observation since then. A recent CT scan showed a spot around one centimeter in size in the right middle lobe. This may be related to a past episode of bronchitis with a severe cough in March or April.  No new complaints since her last visit a year ago, except for some back problems she is currently managing. No recent cold symptoms, flu, or COVID-19 infections. She mentions experiencing leg  swelling recently.  Socially, she has been married to her husband for 12 years, having married at a young age.     MEDICAL HISTORY: Past Medical History:  Diagnosis Date   Allergy    Angiodysplasia of cecum 12/23/2021   1-2 mm   Anxiety    Arthritis    Asthma    dx 10 yrs ago   Atrial fibrillation (HCC)    CHADS VASC score 3. Intolerant to flecainide . s/p afib ablation 02/12/12   Cataract    removed bilaterally   Colon polyps    Diastolic dysfunction    Dysrhythmia    a fib   dx 2014   GERD (gastroesophageal reflux disease)    History of kidney infection     last one was approx 2009   Hyperlipidemia    Hypertension    lung ca dx'd 08/2016   Mild sleep apnea    wears NO equipment   Mitral regurgitation    mild   Neuromuscular disorder (HCC)    Obesity    Plantar fasciitis    Restless leg syndrome    Sinus bradycardia    Sleep apnea    Stress bladder incontinence, female    WEARS SMALL PADS    ALLERGIES:  is allergic to codeine and prednisone.  MEDICATIONS:  Current Outpatient Medications  Medication Sig Dispense Refill   acetaminophen  (TYLENOL ) 500 MG tablet Take 1,000 mg by mouth daily as needed (pain).     calcium  carbonate (OS-CAL) 600 MG TABS Take 600 mg by mouth daily.     cephALEXin (KEFLEX) 500 MG capsule Take 500 mg by mouth 3 (three) times daily.     Cholecalciferol  (VITAMIN D PO)  Take 2,000 Units by mouth daily.     clonazePAM  (KLONOPIN ) 0.5 MG tablet Take 0.25 mg by mouth daily as needed for anxiety.     ELIQUIS 5 MG TABS tablet Take 5 mg by mouth 2 (two) times daily.     famotidine  (PEPCID ) 20 MG tablet Take 20 mg by mouth at bedtime.     furosemide  (LASIX ) 20 MG tablet Take 1 tablet (20 mg total) by mouth daily for 5 days. 5 tablet 0   lisinopril -hydrochlorothiazide  (ZESTORETIC ) 20-25 MG tablet Take 1 tablet by mouth daily.     lovastatin (MEVACOR) 40 MG tablet Take 80 mg by mouth at bedtime. (Patient taking differently: Take 40 mg by mouth at  bedtime.)     MAGNESIUM  PO Take 250 mg by mouth daily.     nitroGLYCERIN  (NITROSTAT ) 0.4 MG SL tablet Place 1 tablet (0.4 mg total) under the tongue every 5 (five) minutes as needed for chest pain. 25 tablet 1   polyethylene glycol (MIRALAX  / GLYCOLAX ) 17 g packet Take 17 g by mouth daily as needed for moderate constipation.     potassium chloride  SA (K-DUR,KLOR-CON ) 20 MEQ tablet Take 20 mEq by mouth daily.     senna (SENOKOT) 8.6 MG tablet Take 1 tablet by mouth daily.     vitamin E  180 MG (400 UNITS) capsule Take 400 Units by mouth daily.      No current facility-administered medications for this visit.    SURGICAL HISTORY:  Past Surgical History:  Procedure Laterality Date   ABDOMINAL HYSTERECTOMY  1992   BSO   APPENDECTOMY     atrial fibrillation ablation  02/12/12   PVI by Dr Kelsie   ATRIAL FIBRILLATION ABLATION N/A 02/12/2012   Procedure: ATRIAL FIBRILLATION ABLATION;  Surgeon: Lynwood Kelsie, MD;  Location: [facility] CATH LAB;  Service: Cardiovascular;  Laterality: N/A;   BLADDER SUSPENSION     CARPAL TUNNEL RELEASE Right 01/26/2020   Procedure: CARPAL TUNNEL RELEASE;  Surgeon: Murrell Kuba, MD;  Location: [facility];  Service: Orthopedics;  Laterality: Right;   COLONOSCOPY W/ POLYPECTOMY  1995, 200,2004, 2007, 05/06/2011   adenomas   heels spurs     x 2   TEE WITHOUT CARDIOVERSION  02/11/2012   Procedure: TRANSESOPHAGEAL ECHOCARDIOGRAM (TEE);  Surgeon: Ezra GORMAN Shuck, MD;  Location[facility] ENDOSCOPY;  Service: Cardiovascular;  Laterality: N/A;   TONSILLECTOMY AND ADENOIDECTOMY     VARICOSE VEIN SURGERY     VIDEO ASSISTED THORACOSCOPY (VATS)/WEDGE RESECTION Right 10/02/2016   Procedure: VIDEO ASSISTED THORACOSCOPY (VATS)/WEDGE RESECTION;  Surgeon: Elspeth JAYSON Millers, MD;  Location: [facility] OR;  Service: Thoracic;  Laterality: Right;    REVIEW OF SYSTEMS:  A comprehensive review of systems was negative.   PHYSICAL EXAMINATION: General appearance: alert, cooperative, and no  distress Head: Normocephalic, without obvious abnormality, atraumatic Neck: no adenopathy, no JVD, supple, symmetrical, trachea midline, and thyroid not enlarged, symmetric, no tenderness/mass/nodules Lymph nodes: Cervical, supraclavicular, and axillary nodes normal. Resp: clear to auscultation bilaterally Back: symmetric, no curvature. ROM normal. No CVA tenderness. Cardio: regular rate and rhythm, S1, S2 normal, no murmur, click, rub or gallop GI: soft, non-tender; bowel sounds normal; no masses,  no organomegaly Extremities: extremities normal, atraumatic, no cyanosis or edema  ECOG PERFORMANCE STATUS: 1 - Symptomatic but completely ambulatory  Pulse 78, temperature 98 F (36.7 C), temperature source Temporal, resp. rate 17, height 5' 2 (1.575 m), weight 193 lb (87.5 kg), SpO2 97%.  LABORATORY DATA: Lab Results  Component Value Date  WBC 7.1 10/05/2024   HGB 9.6 (L) 10/05/2024   HCT 30.6 (L) 10/05/2024   MCV 91.1 10/05/2024   PLT 291 10/05/2024      Chemistry      Component Value Date/Time   NA 141 10/05/2024 1053   NA 142 09/28/2017 0959   K 5.1 10/05/2024 1053   K 4.4 09/28/2017 0959   CL 106 10/05/2024 1053   CO2 31 10/05/2024 1053   CO2 28 09/28/2017 0959   BUN 22 10/05/2024 1053   BUN 21.3 09/28/2017 0959   CREATININE 1.06 (H) 10/05/2024 1053   CREATININE 0.9 09/28/2017 0959      Component Value Date/Time   CALCIUM  9.3 10/05/2024 1053   CALCIUM  9.9 09/28/2017 0959   ALKPHOS 86 10/05/2024 1053   ALKPHOS 87 09/28/2017 0959   AST 13 (L) 10/05/2024 1053   AST 24 09/28/2017 0959   ALT 10 10/05/2024 1053   ALT 24 09/28/2017 0959   BILITOT 0.2 10/05/2024 1053   BILITOT 0.33 09/28/2017 0959       RADIOGRAPHIC STUDIES: CT Chest W Contrast Result Date: 10/07/2024 CLINICAL DATA:  Non-small cell lung cancer (NSCLC), restaging. * Tracking Code: BO * EXAM: CT CHEST WITH CONTRAST TECHNIQUE: Multidetector CT imaging of the chest was performed during intravenous  contrast administration. RADIATION DOSE REDUCTION: This exam was performed according to the departmental dose-optimization program which includes automated exposure control, adjustment of the mA and/or kV according to patient size and/or use of iterative reconstruction technique. CONTRAST:  75mL OMNIPAQUE  IOHEXOL  300 MG/ML  SOLN COMPARISON:  10/12/2023 chest CT. FINDINGS: Cardiovascular: Normal heart size. No significant pericardial effusion/thickening. Left anterior descending coronary atherosclerosis. Atherosclerotic nonaneurysmal thoracic aorta. Normal caliber pulmonary arteries. No central pulmonary emboli. Mediastinum/Nodes: No significant thyroid nodules. Unremarkable esophagus. No pathologically enlarged axillary, mediastinal or hilar lymph nodes. Lungs/Pleura: No pneumothorax. No pleural effusion. Status post medial right upper lobe wedge resection. Irregular bandlike soft tissue thickening along the wedge resection suture line up to 0.9 cm thickness, unchanged from 10/12/2023 CT. Chronic mild to moderate varicoid bronchiectasis in the basilar right middle lobe and lingula with associated thick bandlike subpleural scarring and patchy tree-in-bud opacities, slightly increased in the right middle lobe including a new 1.0 cm indistinct solid right middle lobe nodule on series 8/image 73. Irregular solid 0.8 cm nodule in the peripheral superior segment left lower lobe on image 69, stable. Numerous scattered ground-glass pulmonary nodules throughout both lungs up to 1.9 cm in the posterior right lower lobe on image 91, all stable using similar measurement technique. Upper abdomen: Small hiatal hernia is unchanged. Simple 1.4 cm lateral segment left liver cyst. Scattered calcified small liver granulomas are unchanged. Hypodense splenic lesions, largest 1.8 cm on series 2/image 132, increased from 1.4 cm. Musculoskeletal: No aggressive appearing focal osseous lesions. Marked thoracic spondylosis. IMPRESSION: 1.  Stable thickening along the right upper lobe wedge resection suture line favoring postsurgical scarring. 2. Chronic mild to moderate varicoid bronchiectasis in the mid lungs with associated thick bandlike subpleural scarring and patchy tree-in-bud opacities, slightly increased in the right middle lobe including a new 1.0 cm indistinct solid right middle lobe nodule. Findings are most compatible with chronic infectious bronchiolitis such as due to atypical mycobacterial infection (MAI). Short-term follow-up chest CT recommended in 3 months for the new right middle lobe nodule given the history of malignancy. 3. Numerous scattered ground-glass pulmonary nodules throughout both lungs, all stable. 4. No thoracic adenopathy. 5. Hypodense splenic lesions, largest 1.8 cm, increased from 1.4  cm, technically indeterminate. Suggest attention on follow-up CT or MRI abdomen in 3-6 months. 6. One vessel coronary atherosclerosis. 7. Small hiatal hernia. 8.  Aortic Atherosclerosis (ICD10-I70.0). Electronically Signed   By: Selinda DELENA Blue M.D.   On: 10/07/2024 13:32   US  Venous Img Lower Bilateral (DVT) Result Date: 09/17/2024 CLINICAL DATA:  Bilateral lower extremity pain and swelling EXAM: BILATERAL LOWER EXTREMITY VENOUS DOPPLER ULTRASOUND TECHNIQUE: Gray-scale sonography with graded compression, as well as color Doppler and duplex ultrasound were performed to evaluate the lower extremity deep venous systems from the level of the common femoral vein and including the common femoral, femoral, profunda femoral, popliteal and calf veins including the posterior tibial, peroneal and gastrocnemius veins when visible. The superficial great saphenous vein was also interrogated. Spectral Doppler was utilized to evaluate flow at rest and with distal augmentation maneuvers in the common femoral, femoral and popliteal veins. COMPARISON:  None Available. FINDINGS: RIGHT LOWER EXTREMITY Common Femoral Vein: No evidence of thrombus. Normal  compressibility, respiratory phasicity and response to augmentation. Saphenofemoral Junction: No evidence of thrombus. Normal compressibility and flow on color Doppler imaging. Profunda Femoral Vein: No evidence of thrombus. Normal compressibility and flow on color Doppler imaging. Femoral Vein: No evidence of thrombus. Normal compressibility, respiratory phasicity and response to augmentation. Popliteal Vein: No evidence of thrombus. Normal compressibility, respiratory phasicity and response to augmentation. Calf Veins: No evidence of thrombus. Normal compressibility and flow on color Doppler imaging. Superficial Great Saphenous Vein: No evidence of thrombus. Normal compressibility. Venous Reflux:  None. Other Findings: Minimally complex fluid collection in the popliteal fossa measures 6.3 x 1.6 x 6.1 cm. LEFT LOWER EXTREMITY Common Femoral Vein: No evidence of thrombus. Normal compressibility, respiratory phasicity and response to augmentation. Saphenofemoral Junction: No evidence of thrombus. Normal compressibility and flow on color Doppler imaging. Profunda Femoral Vein: No evidence of thrombus. Normal compressibility and flow on color Doppler imaging. Femoral Vein: No evidence of thrombus. Normal compressibility, respiratory phasicity and response to augmentation. Popliteal Vein: No evidence of thrombus. Normal compressibility, respiratory phasicity and response to augmentation. Calf Veins: No evidence of thrombus. Normal compressibility and flow on color Doppler imaging. Superficial Great Saphenous Vein: No evidence of thrombus. Normal compressibility. Venous Reflux:  None. Other Findings: Minimally complex fluid collection in the popliteal fossa measures 5.2 x 1.5 x 3.7 cm. IMPRESSION: No evidence of deep venous thrombosis in either lower extremity. Bilateral Baker's cysts. Electronically Signed   By: Wilkie Lent M.D.   On: 09/17/2024 12:42   DG Tibia/Fibula Left Result Date: 09/16/2024 CLINICAL  DATA:  Clemens, pain EXAM: LEFT TIBIA AND FIBULA - 2 VIEW COMPARISON:  None Available. FINDINGS: Frontal and lateral views of the left tibia and fibula are obtained. There are no acute displaced fractures. Alignment of the left knee and ankle is anatomic. Diffuse subcutaneous edema. No subcutaneous gas or radiopaque foreign body. IMPRESSION: 1. Diffuse subcutaneous edema. 2. No acute bony abnormality. Electronically Signed   By: Ozell Daring M.D.   On: 09/16/2024 23:10     ASSESSMENT AND PLAN:  This is a very pleasant 83 years old white female with stage IB non-small cell lung cancer, adenocarcinoma with no actionable mutations and several other groundglass opacities bilaterally.  She is status post wedge resection of the right upper lobe nodule. The patient has been in observation since 2017 with no concerning complaints.  The patient had repeat CT scan of the chest performed recently.  I personally independently reviewed the scan images and discussed the result  with the patient and her husband.  Her scan showed inflammatory process in the lung with new 1.0 cm right middle lobe nodule that need close monitoring. Assessment and Plan Assessment & Plan Stage 1A Non- small cell lung cancer, adenocarcinoma Diagnosed in November 2017, she underwent a wedge resection of the right upper lobe. A recent CT scan reveals a new 1 cm lesion in the right middle lobe, suspected to be inflammatory but concerning for malignancy due to her cancer history. The lesion may be related to a past episode of bronchitis with severe cough in March or April. - Order repeat CT scan of the chest in 3 months to monitor the lesion in the right middle lobe - Schedule follow-up appointment one week after the CT scan to discuss results  Leg swelling Reports of leg swelling were noted, but the etiology was not discussed in detail.  She was advised to call immediately if she has any other concerning symptoms in the interval.     The  patient voices understanding of current disease status and treatment options and is in agreement with the current care plan.  All questions were answered. The patient knows to call the clinic with any problems, questions or concerns. We can certainly see the patient much sooner if necessary.  Disclaimer: This note was dictated with voice recognition software. Similar sounding words can inadvertently be transcribed and may not be corrected upon review.

## 2024-10-11 ENCOUNTER — Telehealth: Payer: Self-pay | Admitting: Internal Medicine

## 2024-10-11 ENCOUNTER — Other Ambulatory Visit

## 2024-10-11 NOTE — Telephone Encounter (Signed)
 Scheduled patient for next appointment. Called and spoke with the patient, she is aware.

## 2024-10-17 DIAGNOSIS — Z85828 Personal history of other malignant neoplasm of skin: Secondary | ICD-10-CM | POA: Diagnosis not present

## 2024-10-17 DIAGNOSIS — C44311 Basal cell carcinoma of skin of nose: Secondary | ICD-10-CM | POA: Diagnosis not present

## 2024-10-25 DIAGNOSIS — I872 Venous insufficiency (chronic) (peripheral): Secondary | ICD-10-CM | POA: Diagnosis not present

## 2024-10-30 DIAGNOSIS — I480 Paroxysmal atrial fibrillation: Secondary | ICD-10-CM | POA: Diagnosis not present

## 2024-10-30 DIAGNOSIS — C3491 Malignant neoplasm of unspecified part of right bronchus or lung: Secondary | ICD-10-CM | POA: Diagnosis not present

## 2024-10-30 DIAGNOSIS — I10 Essential (primary) hypertension: Secondary | ICD-10-CM | POA: Diagnosis not present

## 2024-11-02 DIAGNOSIS — R350 Frequency of micturition: Secondary | ICD-10-CM | POA: Diagnosis not present

## 2024-11-02 DIAGNOSIS — R351 Nocturia: Secondary | ICD-10-CM | POA: Diagnosis not present

## 2024-11-02 DIAGNOSIS — N3946 Mixed incontinence: Secondary | ICD-10-CM | POA: Diagnosis not present

## 2024-11-09 DIAGNOSIS — I83892 Varicose veins of left lower extremities with other complications: Secondary | ICD-10-CM | POA: Diagnosis not present

## 2024-11-09 DIAGNOSIS — Z09 Encounter for follow-up examination after completed treatment for conditions other than malignant neoplasm: Secondary | ICD-10-CM | POA: Diagnosis not present

## 2024-12-13 ENCOUNTER — Telehealth: Payer: Self-pay | Admitting: Neurology

## 2024-12-13 NOTE — Telephone Encounter (Signed)
Patient called to verify appointment date and time

## 2025-01-02 ENCOUNTER — Ambulatory Visit (HOSPITAL_COMMUNITY)

## 2025-01-02 ENCOUNTER — Inpatient Hospital Stay

## 2025-01-10 ENCOUNTER — Inpatient Hospital Stay: Admitting: Internal Medicine

## 2025-01-11 ENCOUNTER — Inpatient Hospital Stay: Attending: Internal Medicine

## 2025-01-11 ENCOUNTER — Ambulatory Visit (HOSPITAL_COMMUNITY)

## 2025-01-17 ENCOUNTER — Inpatient Hospital Stay: Admitting: Internal Medicine

## 2025-02-08 ENCOUNTER — Ambulatory Visit: Admitting: Neurology

## 2025-02-23 ENCOUNTER — Ambulatory Visit: Admitting: Student
